# Patient Record
Sex: Female | Born: 1945
Health system: Southern US, Community
[De-identification: ages and names within clinical notes are randomized; demographics above are authoritative.]

## PROBLEM LIST (undated history)

## (undated) DIAGNOSIS — H9190 Unspecified hearing loss, unspecified ear: Secondary | ICD-10-CM

## (undated) DIAGNOSIS — E039 Hypothyroidism, unspecified: Secondary | ICD-10-CM

## (undated) DIAGNOSIS — G4730 Sleep apnea, unspecified: Secondary | ICD-10-CM

## (undated) DIAGNOSIS — T7840XA Allergy, unspecified, initial encounter: Secondary | ICD-10-CM

## (undated) DIAGNOSIS — K900 Celiac disease: Secondary | ICD-10-CM

## (undated) DIAGNOSIS — E119 Type 2 diabetes mellitus without complications: Secondary | ICD-10-CM

## (undated) DIAGNOSIS — K5792 Diverticulitis of intestine, part unspecified, without perforation or abscess without bleeding: Secondary | ICD-10-CM

## (undated) DIAGNOSIS — E785 Hyperlipidemia, unspecified: Secondary | ICD-10-CM

## (undated) DIAGNOSIS — I10 Essential (primary) hypertension: Secondary | ICD-10-CM

## (undated) DIAGNOSIS — J029 Acute pharyngitis, unspecified: Secondary | ICD-10-CM

## (undated) DIAGNOSIS — J449 Chronic obstructive pulmonary disease, unspecified: Secondary | ICD-10-CM

## (undated) DIAGNOSIS — D649 Anemia, unspecified: Secondary | ICD-10-CM

## (undated) HISTORY — PX: EYE SURGERY: SHX253

## (undated) HISTORY — PX: VAGINAL HYSTERECTOMY: SUR661

## (undated) HISTORY — DX: Acute pharyngitis, unspecified: J02.9

## (undated) HISTORY — DX: Hyperlipidemia, unspecified: E78.5

## (undated) HISTORY — DX: Celiac disease: K90.0

## (undated) HISTORY — PX: BREAST SURGERY: SHX581

## (undated) HISTORY — DX: Diverticulitis of intestine, part unspecified, without perforation or abscess without bleeding: K57.92

## (undated) HISTORY — DX: Hypothyroidism, unspecified: E03.9

## (undated) HISTORY — DX: Anemia, unspecified: D64.9

## (undated) HISTORY — DX: Chronic obstructive pulmonary disease, unspecified: J44.9

## (undated) HISTORY — PX: SPINE SURGERY: SHX786

## (undated) HISTORY — DX: Type 2 diabetes mellitus without complications: E11.9

## (undated) HISTORY — DX: Allergy, unspecified, initial encounter: T78.40XA

## (undated) HISTORY — PX: APPENDECTOMY: SHX54

---

## 1968-05-30 DIAGNOSIS — Z780 Asymptomatic menopausal state: Secondary | ICD-10-CM | POA: Insufficient documentation

## 1988-05-30 DIAGNOSIS — E039 Hypothyroidism, unspecified: Secondary | ICD-10-CM | POA: Insufficient documentation

## 2000-05-30 DIAGNOSIS — F4310 Post-traumatic stress disorder, unspecified: Secondary | ICD-10-CM | POA: Insufficient documentation

## 2004-06-23 ENCOUNTER — Ambulatory Visit: Payer: Self-pay | Admitting: Family Medicine

## 2004-06-24 ENCOUNTER — Ambulatory Visit: Payer: Self-pay | Admitting: *Deleted

## 2004-06-25 ENCOUNTER — Ambulatory Visit: Payer: Self-pay | Admitting: Family Medicine

## 2004-07-02 ENCOUNTER — Ambulatory Visit: Payer: Self-pay | Admitting: Family Medicine

## 2004-08-05 ENCOUNTER — Ambulatory Visit: Payer: Self-pay | Admitting: Family Medicine

## 2004-08-12 ENCOUNTER — Ambulatory Visit: Payer: Self-pay | Admitting: Family Medicine

## 2004-08-19 ENCOUNTER — Ambulatory Visit (HOSPITAL_COMMUNITY): Admission: RE | Admit: 2004-08-19 | Discharge: 2004-08-19 | Payer: Self-pay | Admitting: Family Medicine

## 2004-08-20 ENCOUNTER — Ambulatory Visit: Payer: Self-pay | Admitting: Internal Medicine

## 2004-08-24 ENCOUNTER — Ambulatory Visit: Payer: Self-pay | Admitting: Internal Medicine

## 2004-09-02 ENCOUNTER — Ambulatory Visit: Payer: Self-pay | Admitting: Family Medicine

## 2004-09-14 ENCOUNTER — Ambulatory Visit: Payer: Self-pay | Admitting: Family Medicine

## 2004-09-22 ENCOUNTER — Ambulatory Visit: Payer: Self-pay | Admitting: Family Medicine

## 2004-10-04 ENCOUNTER — Ambulatory Visit: Payer: Self-pay | Admitting: Family Medicine

## 2004-10-12 ENCOUNTER — Ambulatory Visit: Payer: Self-pay | Admitting: Family Medicine

## 2004-11-04 ENCOUNTER — Ambulatory Visit: Payer: Self-pay | Admitting: Family Medicine

## 2004-11-15 ENCOUNTER — Ambulatory Visit: Payer: Self-pay | Admitting: Family Medicine

## 2004-11-18 ENCOUNTER — Ambulatory Visit: Payer: Self-pay | Admitting: Family Medicine

## 2004-12-02 ENCOUNTER — Ambulatory Visit: Payer: Self-pay | Admitting: Family Medicine

## 2005-01-19 ENCOUNTER — Ambulatory Visit: Payer: Self-pay | Admitting: Family Medicine

## 2005-02-09 ENCOUNTER — Ambulatory Visit: Payer: Self-pay | Admitting: Family Medicine

## 2007-07-01 DIAGNOSIS — R197 Diarrhea, unspecified: Secondary | ICD-10-CM | POA: Insufficient documentation

## 2007-07-01 LAB — CONVERTED CEMR LAB: Pap Smear: NEGATIVE

## 2008-04-29 DIAGNOSIS — S82899A Other fracture of unspecified lower leg, initial encounter for closed fracture: Secondary | ICD-10-CM | POA: Insufficient documentation

## 2008-10-15 ENCOUNTER — Ambulatory Visit: Payer: Self-pay | Admitting: Internal Medicine

## 2008-10-15 DIAGNOSIS — J309 Allergic rhinitis, unspecified: Secondary | ICD-10-CM | POA: Insufficient documentation

## 2008-10-15 DIAGNOSIS — F17200 Nicotine dependence, unspecified, uncomplicated: Secondary | ICD-10-CM | POA: Insufficient documentation

## 2008-10-15 LAB — CONVERTED CEMR LAB
Blood Glucose, Fingerstick: 108
Hgb A1c MFr Bld: 6.2 %

## 2008-10-16 DIAGNOSIS — E785 Hyperlipidemia, unspecified: Secondary | ICD-10-CM | POA: Insufficient documentation

## 2008-10-22 ENCOUNTER — Ambulatory Visit: Payer: Self-pay | Admitting: Nurse Practitioner

## 2008-10-22 LAB — CONVERTED CEMR LAB
Bilirubin Urine: NEGATIVE
Blood in Urine, dipstick: NEGATIVE
Glucose, Urine, Semiquant: NEGATIVE
Ketones, urine, test strip: NEGATIVE
Nitrite: NEGATIVE
Protein, U semiquant: NEGATIVE
Specific Gravity, Urine: 1.025
Urobilinogen, UA: 0.2
WBC Urine, dipstick: NEGATIVE
pH: 5

## 2008-10-23 ENCOUNTER — Encounter (INDEPENDENT_AMBULATORY_CARE_PROVIDER_SITE_OTHER): Payer: Self-pay | Admitting: Nurse Practitioner

## 2008-10-23 DIAGNOSIS — D649 Anemia, unspecified: Secondary | ICD-10-CM | POA: Insufficient documentation

## 2008-10-23 LAB — CONVERTED CEMR LAB
ALT: 11 units/L (ref 0–35)
AST: 15 units/L (ref 0–37)
Albumin: 4.3 g/dL (ref 3.5–5.2)
Alkaline Phosphatase: 53 units/L (ref 39–117)
BUN: 13 mg/dL (ref 6–23)
Basophils Absolute: 0.1 10*3/uL (ref 0.0–0.1)
Basophils Relative: 1 % (ref 0–1)
CO2: 26 meq/L (ref 19–32)
Calcium: 9.3 mg/dL (ref 8.4–10.5)
Chloride: 106 meq/L (ref 96–112)
Cholesterol: 189 mg/dL (ref 0–200)
Creatinine, Ser: 0.73 mg/dL (ref 0.40–1.20)
Eosinophils Absolute: 0.4 10*3/uL (ref 0.0–0.7)
Eosinophils Relative: 4 % (ref 0–5)
Glucose, Bld: 89 mg/dL (ref 70–99)
HCT: 39.7 % (ref 36.0–46.0)
HDL: 79 mg/dL (ref >39)
Hemoglobin: 11.8 g/dL — ABNORMAL LOW (ref 12.0–15.0)
LDL Cholesterol: 83 mg/dL (ref 0–99)
Lymphocytes Relative: 30 % (ref 12–46)
Lymphs Abs: 2.9 10*3/uL (ref 0.7–4.0)
MCHC: 29.7 g/dL — ABNORMAL LOW (ref 30.0–36.0)
MCV: 87.1 fL (ref 78.0–100.0)
Microalb, Ur: 1.01 mg/dL (ref 0.00–1.89)
Monocytes Absolute: 1.2 10*3/uL — ABNORMAL HIGH (ref 0.1–1.0)
Monocytes Relative: 12 % (ref 3–12)
Neutro Abs: 5.1 10*3/uL (ref 1.7–7.7)
Neutrophils Relative %: 53 % (ref 43–77)
Platelets: 421 10*3/uL — ABNORMAL HIGH (ref 150–400)
Potassium: 5 meq/L (ref 3.5–5.3)
RBC: 4.56 M/uL (ref 3.87–5.11)
RDW: 15.5 % (ref 11.5–15.5)
Sodium: 145 meq/L (ref 135–145)
TSH: 5.62 microintl units/mL — ABNORMAL HIGH (ref 0.350–4.500)
Total Bilirubin: 0.3 mg/dL (ref 0.3–1.2)
Total CHOL/HDL Ratio: 2.4
Total Protein: 7.1 g/dL (ref 6.0–8.3)
Triglycerides: 136 mg/dL (ref <150)
VLDL: 27 mg/dL (ref 0–40)
WBC: 9.6 10*3/uL (ref 4.0–10.5)

## 2008-11-05 ENCOUNTER — Ambulatory Visit: Payer: Self-pay | Admitting: Nurse Practitioner

## 2008-11-05 DIAGNOSIS — F329 Major depressive disorder, single episode, unspecified: Secondary | ICD-10-CM | POA: Insufficient documentation

## 2008-11-05 DIAGNOSIS — R5381 Other malaise: Secondary | ICD-10-CM | POA: Insufficient documentation

## 2008-11-05 DIAGNOSIS — R5383 Other fatigue: Secondary | ICD-10-CM

## 2008-11-05 LAB — CONVERTED CEMR LAB
Blood Glucose, Fingerstick: 122
Cholesterol, target level: 200 mg/dL
HDL goal, serum: 40 mg/dL
LDL Goal: 100 mg/dL

## 2008-11-06 DIAGNOSIS — E559 Vitamin D deficiency, unspecified: Secondary | ICD-10-CM | POA: Insufficient documentation

## 2008-11-06 LAB — CONVERTED CEMR LAB
HCT: 42 % (ref 36.0–46.0)
Hemoglobin: 12.6 g/dL (ref 12.0–15.0)
MCHC: 30 g/dL (ref 30.0–36.0)
MCV: 89.4 fL (ref 78.0–100.0)
Platelets: 448 10*3/uL — ABNORMAL HIGH (ref 150–400)
RBC: 4.7 M/uL (ref 3.87–5.11)
RDW: 16 % — ABNORMAL HIGH (ref 11.5–15.5)
Retic Ct Pct: 1.8 % (ref 0.4–3.1)
WBC: 10.6 10*3/uL — ABNORMAL HIGH (ref 4.0–10.5)

## 2008-11-11 ENCOUNTER — Ambulatory Visit (HOSPITAL_COMMUNITY): Admission: RE | Admit: 2008-11-11 | Discharge: 2008-11-11 | Payer: Self-pay | Admitting: Internal Medicine

## 2008-11-12 ENCOUNTER — Encounter (INDEPENDENT_AMBULATORY_CARE_PROVIDER_SITE_OTHER): Payer: Self-pay | Admitting: Nurse Practitioner

## 2008-11-13 ENCOUNTER — Ambulatory Visit: Payer: Self-pay | Admitting: Internal Medicine

## 2008-11-18 DIAGNOSIS — M949 Disorder of cartilage, unspecified: Secondary | ICD-10-CM

## 2008-11-18 DIAGNOSIS — M899 Disorder of bone, unspecified: Secondary | ICD-10-CM | POA: Insufficient documentation

## 2008-11-19 ENCOUNTER — Encounter (INDEPENDENT_AMBULATORY_CARE_PROVIDER_SITE_OTHER): Payer: Self-pay | Admitting: *Deleted

## 2008-11-19 ENCOUNTER — Encounter (INDEPENDENT_AMBULATORY_CARE_PROVIDER_SITE_OTHER): Payer: Self-pay | Admitting: Nurse Practitioner

## 2008-12-03 ENCOUNTER — Ambulatory Visit: Payer: Self-pay | Admitting: Nurse Practitioner

## 2008-12-03 DIAGNOSIS — H539 Unspecified visual disturbance: Secondary | ICD-10-CM | POA: Insufficient documentation

## 2008-12-03 LAB — CONVERTED CEMR LAB: Blood Glucose, Fingerstick: 78

## 2008-12-14 ENCOUNTER — Encounter (INDEPENDENT_AMBULATORY_CARE_PROVIDER_SITE_OTHER): Payer: Self-pay | Admitting: Nurse Practitioner

## 2009-02-11 ENCOUNTER — Ambulatory Visit: Payer: Self-pay | Admitting: Nurse Practitioner

## 2009-02-12 LAB — CONVERTED CEMR LAB
ALT: 11 units/L (ref 0–35)
AST: 15 units/L (ref 0–37)
Albumin: 4.3 g/dL (ref 3.5–5.2)
Alkaline Phosphatase: 53 units/L (ref 39–117)
Bilirubin, Direct: 0.1 mg/dL (ref 0.0–0.3)
Cholesterol: 211 mg/dL — ABNORMAL HIGH (ref 0–200)
HDL: 79 mg/dL (ref >39)
Hgb A1c MFr Bld: 6.2 % — ABNORMAL HIGH (ref 4.6–6.1)
Indirect Bilirubin: 0.2 mg/dL (ref 0.0–0.9)
LDL Cholesterol: 106 mg/dL — ABNORMAL HIGH (ref 0–99)
TSH: 12.421 microintl units/mL — ABNORMAL HIGH (ref 0.350–4.500)
Total Bilirubin: 0.3 mg/dL (ref 0.3–1.2)
Total CHOL/HDL Ratio: 2.7
Total Protein: 7 g/dL (ref 6.0–8.3)
Triglycerides: 129 mg/dL (ref <150)
VLDL: 26 mg/dL (ref 0–40)
Vit D, 25-Hydroxy: 33 ng/mL (ref 30–89)

## 2009-02-18 ENCOUNTER — Ambulatory Visit: Payer: Self-pay | Admitting: Nurse Practitioner

## 2009-02-18 LAB — CONVERTED CEMR LAB: Blood Glucose, Fingerstick: 110

## 2009-05-10 DIAGNOSIS — J4489 Other specified chronic obstructive pulmonary disease: Secondary | ICD-10-CM | POA: Insufficient documentation

## 2009-05-10 DIAGNOSIS — J449 Chronic obstructive pulmonary disease, unspecified: Secondary | ICD-10-CM | POA: Insufficient documentation

## 2009-05-11 DIAGNOSIS — J438 Other emphysema: Secondary | ICD-10-CM | POA: Insufficient documentation

## 2009-05-17 ENCOUNTER — Inpatient Hospital Stay (HOSPITAL_COMMUNITY): Admission: EM | Admit: 2009-05-17 | Discharge: 2009-05-19 | Payer: Self-pay | Admitting: Emergency Medicine

## 2009-05-27 ENCOUNTER — Ambulatory Visit: Payer: Self-pay | Admitting: Nurse Practitioner

## 2009-05-27 LAB — CONVERTED CEMR LAB
Blood Glucose, Fingerstick: 94
Hgb A1c MFr Bld: 6.2 %

## 2009-05-28 LAB — CONVERTED CEMR LAB: TSH: 9.135 microintl units/mL — ABNORMAL HIGH (ref 0.350–4.500)

## 2009-06-04 ENCOUNTER — Telehealth (INDEPENDENT_AMBULATORY_CARE_PROVIDER_SITE_OTHER): Payer: Self-pay | Admitting: Nurse Practitioner

## 2009-06-12 ENCOUNTER — Ambulatory Visit: Payer: Self-pay | Admitting: Nurse Practitioner

## 2009-06-12 LAB — CONVERTED CEMR LAB: Blood Glucose, Fingerstick: 102

## 2009-06-15 LAB — CONVERTED CEMR LAB
ALT: 13 units/L (ref 0–35)
AST: 14 units/L (ref 0–37)
Albumin: 4.1 g/dL (ref 3.5–5.2)
Alkaline Phosphatase: 58 units/L (ref 39–117)
Bilirubin, Direct: 0.1 mg/dL (ref 0.0–0.3)
Cholesterol: 191 mg/dL (ref 0–200)
HDL: 63 mg/dL (ref >39)
Indirect Bilirubin: 0.2 mg/dL (ref 0.0–0.9)
LDL Cholesterol: 107 mg/dL — ABNORMAL HIGH (ref 0–99)
Total Bilirubin: 0.3 mg/dL (ref 0.3–1.2)
Total CHOL/HDL Ratio: 3
Total Protein: 7 g/dL (ref 6.0–8.3)
Triglycerides: 106 mg/dL (ref <150)
VLDL: 21 mg/dL (ref 0–40)

## 2009-06-24 ENCOUNTER — Encounter (INDEPENDENT_AMBULATORY_CARE_PROVIDER_SITE_OTHER): Payer: Self-pay | Admitting: *Deleted

## 2009-06-24 ENCOUNTER — Telehealth (INDEPENDENT_AMBULATORY_CARE_PROVIDER_SITE_OTHER): Payer: Self-pay | Admitting: Nurse Practitioner

## 2009-07-20 ENCOUNTER — Encounter (INDEPENDENT_AMBULATORY_CARE_PROVIDER_SITE_OTHER): Payer: Self-pay | Admitting: Nurse Practitioner

## 2009-08-05 ENCOUNTER — Emergency Department (HOSPITAL_COMMUNITY): Admission: EM | Admit: 2009-08-05 | Discharge: 2009-08-05 | Payer: Self-pay | Admitting: Emergency Medicine

## 2009-08-09 ENCOUNTER — Emergency Department (HOSPITAL_COMMUNITY): Admission: EM | Admit: 2009-08-09 | Discharge: 2009-08-10 | Payer: Self-pay | Admitting: Emergency Medicine

## 2010-06-20 ENCOUNTER — Encounter: Payer: Self-pay | Admitting: Family Medicine

## 2010-06-21 ENCOUNTER — Encounter: Payer: Self-pay | Admitting: Internal Medicine

## 2010-06-29 NOTE — Progress Notes (Signed)
Summary: [facility]  Phone Note From Other [facility]   Summary of Call: [facility] Keenan [facility] [phone] called to give her O2 sat 97% on 1liter then after 15 mins rechecked while pt was sitting and it went to 98%, he monitored her doing daily activities going to the bathroom, then rechecked 02 sat and it was at 92%.  He state no signs of oxygen hunger and if you had any questions for him that he could be reached at the number above. Initial call taken by: Isla Pence,  June 04, 2009 10:57 AM  Follow-up for Phone Call        Noted. Pt has f/u in this office on next week and will decrease oxygen at that point Follow-up by: Aurora Mask FNP,  June 04, 2009 2:48 PM

## 2010-06-29 NOTE — Letter (Signed)
Summary: *HSN Results Follow up  [place], [place] [address]   Phone: [phone]  Fax: [phone]      06/24/2009   [facility] [address] [place], [place]  [address]   Dear  Ms. Autumn Bowen,                            ____S.Drinkard,FNP   ____D. Gore,FNP       ____B. McPherson,MD   ____V. Rankins,MD    ____E. Mulberry,MD    ____N. Hassell Done, FNP  ____D. Jobe Igo, MD    ____K. Tomma Lightning, MD    ____Other     This letter is to inform you that your recent test(s):  _______Pap Smear    _______Lab Test     _______X-ray    _______ is within acceptable limits  _______ requires a medication change  _______ requires a follow-up lab visit  _______ requires a follow-up visit with your provider    Comments:  We have tried reaching you at [phone].  Please contact the office for lab results at your earliest convenience.       _________________________________________________________ If you have any questions, please contact our office                     Sincerely,  Isla Pence [facility]

## 2010-06-29 NOTE — Letter (Signed)
Summary: [facility] FORMS   Imported By: Roberto Scales 11/04/2008 17:11:29  _____________________________________________________________________  External Attachment:    Type:   Image     Comment:   External Document

## 2010-06-29 NOTE — Progress Notes (Signed)
Summary: [place]  Phone Note Call from Patient Call back at [facility] Phone [phone] Call back at [phone]   Summary of Call: The pt needs the medical assistant or the provider to call her back because she has a question in reference to one medication. [facility]  FNP Initial call taken by: Alexis Goodell,  June 24, 2009 11:55 AM  Follow-up for Phone Call        pt informed about medication change.  She says that she has an appt wiith eligibility the first week in February. She said she can not get the medication from there she needs it sent to the [facility] in [place]. Follow-up by: Isla Pence,  June 24, 2009 2:31 PM  Additional Follow-up for Phone Call Additional follow up Details #1::        med will be expensive at [place] will given 4 weeks worth of samples by then she should have her eligibilty card renewed and will be able to get from [place] Additional Follow-up by: Aurora Mask FNP,  June 24, 2009 2:43 PM    Additional Follow-up for Phone Call Additional follow up Details #2::    pt informed.  Pt will come today to pick up samples.  Samples are at front desk. Follow-up by: Isla Pence,  June 24, 2009 2:52 PM

## 2010-06-29 NOTE — Letter (Signed)
Summary: [facility]ORDERS  [facility]//ORDERS   Imported By: Roland Earl 07/20/2009 12:59:11  _____________________________________________________________________  External Attachment:    Type:   Image     Comment:   External Document

## 2010-06-29 NOTE — Assessment & Plan Note (Signed)
Summary: COPD/Hypothyroidism   Vital Signs:  Patient profile:   65 year old female Menstrual status:  hysterectomy Weight:      245.7 pounds O2 Sat:      96 % on Room air Temp:     97.7 degrees F oral Pulse rhythm:   regular BP sitting:   153 / 84  (left arm) Cuff size:   large  Vitals Entered By: Isla Pence (June 12, 2009 12:55 PM)  O2 Flow:  Room air CC: 2 week follow-up, Lipid Management Is Patient Diabetic? Yes Pain Assessment Patient in pain? yes     Location: shoulder Intensity: 6-7 CBG Result 102 CBG Device ID B  Does patient need assistance? Functional Status Self care Ambulation Normal   CC:  2 week follow-up and Lipid Management.  History of Present Illness:  Pt into the office for follow up - COPD.  Pt into the office today for f/u on oxygen requirements. Pt is NOT wearing her oxygen today and admits that since she was last evaluated by [facility].    Diabetes - Presents today with blood sugar log: 96, 103, 100, 105, 84, 103, 94, 103, 111, 102, 104, 110, 103, 122  Left shoulder subluxation - pt was to go to ortho and she called to make an appointment but was told she needed a co-payment so she did not move forward.  Lipid Management History:      Positive NCEP/ATP III risk factors include female age 10 years old or older and diabetes.  Negative NCEP/ATP III risk factors include no history of early menopause without estrogen hormone replacement, HDL cholesterol greater than 60, non-tobacco-user status, non-hypertensive, no ASHD (atherosclerotic heart disease), no prior stroke/TIA, no peripheral vascular disease, and no history of aortic aneurysm.        The patient states that she does not know about the "Therapeutic Lifestyle Change" diet.  The patient does not know about adjunctive measures for cholesterol lowering.  She expresses no side effects from her lipid-lowering medication.  The patient denies any symptoms to suggest myopathy or liver  disease.     Habits & Providers  Alcohol-Tobacco-Diet     Alcohol drinks/day: 0     Tobacco Status: quit < 6 months     Tobacco Counseling: to quit use of tobacco products     Cigarette Packs/Day: 5-6     Year Started: age 29     Year Quit: 05/16/2009  Exercise-Depression-Behavior     Does Patient Exercise: no     Exercise Counseling: to improve exercise regimen     Have you felt down or hopeless? no     Have you felt little pleasure in things? no     Drug Use: never     Seat Belt Use: always  Comments: Smoked 2 cigarettes since her last visit here.  Medications Prior to Update: 1)  Bayer Low Strength 81 Mg Tbec (Aspirin) .... One Tablet By Mouth Daily 2)  Glipizide 10 Mg Tabs (Glipizide) .Marland Kitchen.. 1 Tablet By Mouth Two Times A Day 3)  Levothyroxine Sodium 175 Mcg Tabs (Levothyroxine Sodium) .... One Tablet By Mouth Daily **pharmacy - Note Increase in Dose** 4)  Pravastatin Sodium 40 Mg Tabs (Pravastatin Sodium) .... 2 Tablets By Mouth Nightly 5)  Actos 30 Mg Tabs (Pioglitazone Hcl) .... One Tablet By Mouth Daily For Diabetes 6)  Sertraline Hcl 100 Mg Tabs (Sertraline Hcl) .... One Tablet By Mouth For Mood 7)  Seroquel 200 Mg Tabs (Quetiapine Fumarate) .Marland KitchenMarland KitchenMarland Kitchen  1 and 1/2 Tablet By Mouth Daily 8)  Ferrous Sulfate 325 (65 Fe) Mg Tabs (Ferrous Sulfate) .... One Tablet By Mouth Daily 9)  Calcium-D 600-200 Mg-Unit Tabs (Calcium Carbonate-Vitamin D) .Marland Kitchen.. 1 Tablet By Mouth Two Times A Day 10)  Ventolin Hfa 108 (90 Base) Mcg/act Aers (Albuterol Sulfate) .... Inhale 2 Puffs Ever 4 Hours As Needed For Shortness of Breath  Allergies (verified): 1)  ! * Ivp Dye 2)  ! Penicillin 3)  ! Lipitor (Atorvastatin) 4)  ! * Nexium 5)  ! * Bee Stings 6)  ! * Latex  Review of Systems General:  Denies fever. CV:  Denies chest pain or discomfort. Resp:  Denies cough and shortness of breath. GI:  Denies abdominal pain, nausea, and vomiting. MS:  Complains of joint pain; left shoulder.  Physical  Exam  General:  alert.   Head:  normocephalic.   Lungs:  normal breath sounds.   Heart:  normal rate and regular rhythm.   Abdomen:  normal bowel sounds.   Neurologic:  alert & oriented X3.     Impression & Recommendations:  Problem # 1:  COPD (ICD-496) Assessment Comment Only Doing will without her oxygen during the day but is still using at night advised [facility] for [facility] sats at home  Her updated medication list for this problem includes:    Ventolin Hfa 108 (90 Base) Mcg/act Aers (Albuterol sulfate) ..... Inhale 2 puffs ever 4 hours as needed for shortness of breath  Orders: Pulse Oximetry (single measurment) (68127)  Problem # 2:  DYSLIPIDEMIA (ICD-272.4) will check cholesterol today. Her updated medication list for this problem includes:    Pravastatin Sodium 40 Mg Tabs (Pravastatin sodium) .Marland Kitchen... 2 tablets by mouth nightly  Problem # 3:  SUBLUXATION OF THE LEFT SHOULDER (ICD-755.59) Pt advised that she will need to f/u with ortho and she may need a co-payment  Problem # 4:  HYPOTHYROIDISM (ICD-244.9)  Her updated medication list for this problem includes:    Levothyroxine Sodium 175 Mcg Tabs (Levothyroxine sodium) ..... One tablet by mouth daily **pharmacy - note increase in dose**  Complete Medication List: 1)  Bayer Low Strength 81 Mg Tbec (Aspirin) .... One tablet by mouth daily 2)  Glipizide 10 Mg Tabs (Glipizide) .Marland Kitchen.. 1 tablet by mouth two times a day 3)  Levothyroxine Sodium 175 Mcg Tabs (Levothyroxine sodium) .... One tablet by mouth daily **pharmacy - note increase in dose** 4)  Pravastatin Sodium 40 Mg Tabs (Pravastatin sodium) .... 2 tablets by mouth nightly 5)  Actos 30 Mg Tabs (Pioglitazone hcl) .... One tablet by mouth daily for diabetes 6)  Sertraline Hcl 100 Mg Tabs (Sertraline hcl) .... One tablet by mouth for mood 7)  Seroquel 200 Mg Tabs (Quetiapine fumarate) .Marland Kitchen.. 1 and 1/2 tablet by mouth daily 8)  Ferrous Sulfate 325 (65 Fe) Mg  Tabs (Ferrous sulfate) .... One tablet by mouth daily 9)  Calcium-d 600-200 Mg-unit Tabs (Calcium carbonate-vitamin d) .Marland Kitchen.. 1 tablet by mouth two times a day 10)  Ventolin Hfa 108 (90 Base) Mcg/act Aers (Albuterol sulfate) .... Inhale 2 puffs ever 4 hours as needed for shortness of breath  Lipid Assessment/Plan:      Based on NCEP/ATP III, the patient's risk factor category is "history of diabetes".  The patient's lipid goals are as follows: Total cholesterol goal is 200; LDL cholesterol goal is 100; HDL cholesterol goal is 40; Triglyceride goal is 150.    Patient Instructions: 1)  Your oxygen is doing good.  Ok to continue using your oxygen at night. 2)  Follow up in this office in March 2011. 3)  Will need TSH.

## 2010-06-29 NOTE — Letter (Signed)
Summary: [facility] //PROFESSIONAL COMMUNICATION  [facility] //PROFESSIONAL COMMUNICATION   Imported By: Roland Earl 07/28/2009 14:39:11  _____________________________________________________________________  External Attachment:    Type:   Image     Comment:   External Document

## 2010-08-23 LAB — GLUCOSE, CAPILLARY: Glucose-Capillary: 157 mg/dL — ABNORMAL HIGH (ref 70–99)

## 2010-08-30 LAB — BLOOD GAS, ARTERIAL
Acid-Base Excess: 0.5 mmol/L (ref 0.0–2.0)
Acid-Base Excess: 1.5 mmol/L (ref 0.0–2.0)
Acid-Base Excess: 3.9 mmol/L — ABNORMAL HIGH (ref 0.0–2.0)
Acid-Base Excess: 8 mmol/L — ABNORMAL HIGH (ref 0.0–2.0)
Bicarbonate: 27 mEq/L — ABNORMAL HIGH (ref 20.0–24.0)
Bicarbonate: 28.5 mEq/L — ABNORMAL HIGH (ref 20.0–24.0)
Bicarbonate: 31.9 mEq/L — ABNORMAL HIGH (ref 20.0–24.0)
Bicarbonate: 34 mEq/L — ABNORMAL HIGH (ref 20.0–24.0)
Drawn by: 229971
Drawn by: 309961
Drawn by: 309961
FIO2: 0.21 %
O2 Content: 2 L/min
O2 Content: 2 L/min
O2 Content: 2 L/min
O2 Saturation: 91.6 %
O2 Saturation: 92.1 %
O2 Saturation: 96.9 %
O2 Saturation: 98.2 %
Patient temperature: 98.6
Patient temperature: 98.6
Patient temperature: 98.6
Patient temperature: 98.6
TCO2: 24.6 mmol/L (ref 0–100)
TCO2: 26.2 mmol/L (ref 0–100)
TCO2: 29.6 mmol/L (ref 0–100)
TCO2: 31 mmol/L (ref 0–100)
pCO2 arterial: 54.4 mmHg — ABNORMAL HIGH (ref 35.0–45.0)
pCO2 arterial: 56.6 mmHg — ABNORMAL HIGH (ref 35.0–45.0)
pCO2 arterial: 59.1 mmHg (ref 35.0–45.0)
pCO2 arterial: 69.1 mmHg (ref 35.0–45.0)
pH, Arterial: 7.287 — ABNORMAL LOW (ref 7.350–7.400)
pH, Arterial: 7.305 — ABNORMAL LOW (ref 7.350–7.400)
pH, Arterial: 7.316 — ABNORMAL LOW (ref 7.350–7.400)
pH, Arterial: 7.395 (ref 7.350–7.400)
pO2, Arterial: 109 mmHg — ABNORMAL HIGH (ref 80.0–100.0)
pO2, Arterial: 61.3 mmHg — ABNORMAL LOW (ref 80.0–100.0)
pO2, Arterial: 65.9 mmHg — ABNORMAL LOW (ref 80.0–100.0)
pO2, Arterial: 78.6 mmHg — ABNORMAL LOW (ref 80.0–100.0)

## 2010-08-30 LAB — URINALYSIS, ROUTINE W REFLEX MICROSCOPIC
Bilirubin Urine: NEGATIVE
Glucose, UA: NEGATIVE mg/dL
Hgb urine dipstick: NEGATIVE
Ketones, ur: NEGATIVE mg/dL
Nitrite: NEGATIVE
Protein, ur: NEGATIVE mg/dL
Specific Gravity, Urine: 1.025 (ref 1.005–1.030)
Urobilinogen, UA: 0.2 mg/dL (ref 0.0–1.0)
pH: 5.5 (ref 5.0–8.0)

## 2010-08-30 LAB — CULTURE, BLOOD (ROUTINE X 2)
Culture: NO GROWTH
Culture: NO GROWTH

## 2010-08-30 LAB — CBC
HCT: 37.1 % (ref 36.0–46.0)
HCT: 41.7 % (ref 36.0–46.0)
Hemoglobin: 12 g/dL (ref 12.0–15.0)
Hemoglobin: 14 g/dL (ref 12.0–15.0)
MCHC: 32.4 g/dL (ref 30.0–36.0)
MCHC: 33.6 g/dL (ref 30.0–36.0)
MCV: 91.4 fL (ref 78.0–100.0)
MCV: 92.6 fL (ref 78.0–100.0)
Platelets: 270 10*3/uL (ref 150–400)
Platelets: 332 10*3/uL (ref 150–400)
RBC: 4 MIL/uL (ref 3.87–5.11)
RBC: 4.56 MIL/uL (ref 3.87–5.11)
RDW: 15.5 % (ref 11.5–15.5)
RDW: 16 % — ABNORMAL HIGH (ref 11.5–15.5)
WBC: 12.6 10*3/uL — ABNORMAL HIGH (ref 4.0–10.5)
WBC: 9.9 10*3/uL (ref 4.0–10.5)

## 2010-08-30 LAB — GLUCOSE, CAPILLARY
Glucose-Capillary: 110 mg/dL — ABNORMAL HIGH (ref 70–99)
Glucose-Capillary: 120 mg/dL — ABNORMAL HIGH (ref 70–99)
Glucose-Capillary: 127 mg/dL — ABNORMAL HIGH (ref 70–99)
Glucose-Capillary: 135 mg/dL — ABNORMAL HIGH (ref 70–99)
Glucose-Capillary: 135 mg/dL — ABNORMAL HIGH (ref 70–99)
Glucose-Capillary: 136 mg/dL — ABNORMAL HIGH (ref 70–99)
Glucose-Capillary: 142 mg/dL — ABNORMAL HIGH (ref 70–99)
Glucose-Capillary: 70 mg/dL (ref 70–99)
Glucose-Capillary: 72 mg/dL (ref 70–99)
Glucose-Capillary: 75 mg/dL (ref 70–99)
Glucose-Capillary: 77 mg/dL (ref 70–99)
Glucose-Capillary: 79 mg/dL (ref 70–99)

## 2010-08-30 LAB — CK TOTAL AND CKMB (NOT AT ARMC)
CK, MB: 2.4 ng/mL (ref 0.3–4.0)
Relative Index: 1.4 (ref 0.0–2.5)
Total CK: 166 U/L (ref 7–177)

## 2010-08-30 LAB — POCT CARDIAC MARKERS
CKMB, poc: <1 ng/mL — ABNORMAL LOW (ref 1.0–8.0)
Myoglobin, poc: 35.9 ng/mL (ref 12–200)
Troponin i, poc: <0.05 ng/mL (ref 0.00–0.09)

## 2010-08-30 LAB — CARDIAC PANEL(CRET KIN+CKTOT+MB+TROPI)
CK, MB: 2.1 ng/mL (ref 0.3–4.0)
CK, MB: 2.3 ng/mL (ref 0.3–4.0)
Relative Index: 1.4 (ref 0.0–2.5)
Relative Index: 1.6 (ref 0.0–2.5)
Total CK: 132 U/L (ref 7–177)
Total CK: 167 U/L (ref 7–177)
Troponin I: 0.02 ng/mL (ref 0.00–0.06)
Troponin I: 0.03 ng/mL (ref 0.00–0.06)

## 2010-08-30 LAB — BASIC METABOLIC PANEL
BUN: 10 mg/dL (ref 6–23)
BUN: 16 mg/dL (ref 6–23)
CO2: 26 mEq/L (ref 19–32)
CO2: 32 mEq/L (ref 19–32)
Calcium: 8.2 mg/dL — ABNORMAL LOW (ref 8.4–10.5)
Calcium: 8.6 mg/dL (ref 8.4–10.5)
Chloride: 99 mEq/L (ref 96–112)
Chloride: 99 mEq/L (ref 96–112)
Creatinine, Ser: 0.56 mg/dL (ref 0.4–1.2)
Creatinine, Ser: 0.7 mg/dL (ref 0.4–1.2)
GFR calc Af Amer: >60 mL/min (ref >60)
GFR calc Af Amer: >60 mL/min (ref >60)
GFR calc non Af Amer: >60 mL/min (ref >60)
GFR calc non Af Amer: >60 mL/min (ref >60)
Glucose, Bld: 166 mg/dL — ABNORMAL HIGH (ref 70–99)
Glucose, Bld: 43 mg/dL — ABNORMAL LOW (ref 70–99)
Potassium: 3.6 mEq/L (ref 3.5–5.1)
Potassium: 4 mEq/L (ref 3.5–5.1)
Sodium: 135 mEq/L (ref 135–145)
Sodium: 139 mEq/L (ref 135–145)

## 2010-08-30 LAB — ANGIOTENSIN CONVERTING ENZYME: Angiotensin-Converting Enzyme: 31 U/L (ref 9–67)

## 2010-08-30 LAB — LIPID PANEL
Cholesterol: 185 mg/dL (ref 0–200)
HDL: 61 mg/dL (ref >39)
LDL Cholesterol: 96 mg/dL (ref 0–99)
Total CHOL/HDL Ratio: 3 RATIO
Triglycerides: 139 mg/dL (ref <150)
VLDL: 28 mg/dL (ref 0–40)

## 2010-08-30 LAB — DIFFERENTIAL
Basophils Absolute: 0.2 10*3/uL — ABNORMAL HIGH (ref 0.0–0.1)
Basophils Relative: 2 % — ABNORMAL HIGH (ref 0–1)
Eosinophils Absolute: 0.3 10*3/uL (ref 0.0–0.7)
Eosinophils Relative: 2 % (ref 0–5)
Lymphocytes Relative: 25 % (ref 12–46)
Lymphs Abs: 3.2 10*3/uL (ref 0.7–4.0)
Monocytes Absolute: 1.2 10*3/uL — ABNORMAL HIGH (ref 0.1–1.0)
Monocytes Relative: 9 % (ref 3–12)
Neutro Abs: 7.7 10*3/uL (ref 1.7–7.7)
Neutrophils Relative %: 61 % (ref 43–77)

## 2010-08-30 LAB — HEMOGLOBIN A1C
Hgb A1c MFr Bld: 6.5 % — ABNORMAL HIGH (ref 4.6–6.1)
Mean Plasma Glucose: 140 mg/dL

## 2010-08-30 LAB — TSH
TSH: 6.821 u[IU]/mL — ABNORMAL HIGH (ref 0.350–4.500)
TSH: 7.28 u[IU]/mL — ABNORMAL HIGH (ref 0.350–4.500)

## 2010-08-30 LAB — D-DIMER, QUANTITATIVE: D-Dimer, Quant: 1.18 ug/mL-FEU — ABNORMAL HIGH (ref 0.00–0.48)

## 2010-08-30 LAB — TROPONIN I: Troponin I: 0.03 ng/mL (ref 0.00–0.06)

## 2011-05-06 DIAGNOSIS — R519 Headache, unspecified: Secondary | ICD-10-CM | POA: Insufficient documentation

## 2011-05-06 DIAGNOSIS — M25512 Pain in left shoulder: Secondary | ICD-10-CM | POA: Insufficient documentation

## 2011-05-06 DIAGNOSIS — G4700 Insomnia, unspecified: Secondary | ICD-10-CM | POA: Insufficient documentation

## 2011-09-14 ENCOUNTER — Ambulatory Visit (HOSPITAL_COMMUNITY): Payer: Self-pay | Admitting: Psychiatry

## 2011-09-21 ENCOUNTER — Ambulatory Visit (INDEPENDENT_AMBULATORY_CARE_PROVIDER_SITE_OTHER): Payer: Medicare Other | Admitting: Psychiatry

## 2011-09-21 ENCOUNTER — Encounter (HOSPITAL_COMMUNITY): Payer: Self-pay | Admitting: Psychiatry

## 2011-09-21 VITALS — BP 125/85 | HR 92 | Ht 63.0 in | Wt 218.0 lb

## 2011-09-21 DIAGNOSIS — F4310 Post-traumatic stress disorder, unspecified: Secondary | ICD-10-CM

## 2011-09-21 MED ORDER — SERTRALINE HCL 100 MG PO TABS: 200.0000 mg | ORAL_TABLET | Freq: Every day | ORAL | Status: DC

## 2011-09-21 MED ORDER — TRAZODONE HCL 50 MG PO TABS: ORAL_TABLET | ORAL | Status: DC
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
## 2011-09-22 ENCOUNTER — Telehealth (HOSPITAL_COMMUNITY): Payer: Self-pay

## 2011-09-26 ENCOUNTER — Encounter (HOSPITAL_COMMUNITY): Payer: Self-pay | Admitting: Psychiatry

## 2011-09-26 NOTE — Telephone Encounter (Signed)
Ackowledged

## 2011-09-27 ENCOUNTER — Telehealth (HOSPITAL_COMMUNITY): Payer: Self-pay | Admitting: Psychiatry

## 2011-09-27 DIAGNOSIS — F329 Major depressive disorder, single episode, unspecified: Secondary | ICD-10-CM

## 2011-09-27 MED ORDER — QUETIAPINE FUMARATE ER 200 MG PO TB24: 200.0000 mg | ORAL_TABLET | Freq: Every day | ORAL | Status: DC

## 2011-09-27 NOTE — Telephone Encounter (Signed)
Patient called reporting that had hives from trazodone. She continues to report depression. She states she is also unable to sleep since discontinuing quetiapine.  PLAN: Will restart quetiapine for augmentation of depression and insomnia.  Called pharmacy patient had been taking Seroquel XR 200 mg which she reports was started in  2006. Will continue this medication.

## 2011-10-19 ENCOUNTER — Ambulatory Visit (INDEPENDENT_AMBULATORY_CARE_PROVIDER_SITE_OTHER): Payer: Medicare Other | Admitting: Psychiatry

## 2011-10-19 ENCOUNTER — Encounter (HOSPITAL_COMMUNITY): Payer: Self-pay | Admitting: Psychiatry

## 2011-10-19 VITALS — BP 122/75 | HR 93 | Ht 63.0 in | Wt 218.0 lb

## 2011-10-19 DIAGNOSIS — F4310 Post-traumatic stress disorder, unspecified: Secondary | ICD-10-CM

## 2011-10-19 MED ORDER — QUETIAPINE FUMARATE ER 200 MG PO TB24: 200.0000 mg | ORAL_TABLET | Freq: Every day | ORAL | Status: DC

## 2011-10-19 MED ORDER — SERTRALINE HCL 100 MG PO TABS: 200.0000 mg | ORAL_TABLET | Freq: Every day | ORAL | Status: DC
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
## 2011-11-10 ENCOUNTER — Ambulatory Visit (INDEPENDENT_AMBULATORY_CARE_PROVIDER_SITE_OTHER): Payer: Medicare Other | Admitting: Psychiatry

## 2011-11-10 VITALS — BP 116/59 | HR 90 | Ht 63.0 in | Wt 218.5 lb

## 2011-11-10 DIAGNOSIS — F4310 Post-traumatic stress disorder, unspecified: Secondary | ICD-10-CM

## 2011-11-10 DIAGNOSIS — F329 Major depressive disorder, single episode, unspecified: Secondary | ICD-10-CM

## 2011-11-10 MED ORDER — QUETIAPINE FUMARATE 100 MG PO TABS: ORAL_TABLET | ORAL | Status: DC

## 2011-11-10 MED ORDER — SERTRALINE HCL 100 MG PO TABS: 200.0000 mg | ORAL_TABLET | Freq: Every day | ORAL | Status: DC
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
## 2011-11-15 ENCOUNTER — Encounter (HOSPITAL_COMMUNITY): Payer: Self-pay | Admitting: Psychiatry

## 2011-11-16 ENCOUNTER — Ambulatory Visit (HOSPITAL_COMMUNITY): Payer: Self-pay | Admitting: Licensed Clinical Social Worker

## 2011-12-08 ENCOUNTER — Ambulatory Visit (INDEPENDENT_AMBULATORY_CARE_PROVIDER_SITE_OTHER): Payer: Medicare Other | Admitting: Psychiatry

## 2011-12-08 ENCOUNTER — Encounter (HOSPITAL_COMMUNITY): Payer: Self-pay | Admitting: Psychiatry

## 2011-12-08 VITALS — BP 117/70 | HR 90 | Ht 63.0 in | Wt 218.0 lb

## 2011-12-08 DIAGNOSIS — F4310 Post-traumatic stress disorder, unspecified: Secondary | ICD-10-CM

## 2011-12-08 DIAGNOSIS — F329 Major depressive disorder, single episode, unspecified: Secondary | ICD-10-CM
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
## 2011-12-18 MED ORDER — QUETIAPINE FUMARATE 100 MG PO TABS: ORAL_TABLET | ORAL | Status: DC

## 2011-12-18 MED ORDER — SERTRALINE HCL 100 MG PO TABS: 200.0000 mg | ORAL_TABLET | Freq: Every day | ORAL | Status: DC

## 2011-12-27 ENCOUNTER — Telehealth (HOSPITAL_COMMUNITY): Payer: Self-pay

## 2011-12-27 DIAGNOSIS — F4310 Post-traumatic stress disorder, unspecified: Secondary | ICD-10-CM

## 2011-12-27 MED ORDER — QUETIAPINE FUMARATE 200 MG PO TABS: 200.0000 mg | ORAL_TABLET | Freq: Every day | ORAL | Status: DC

## 2011-12-27 MED ORDER — SERTRALINE HCL 100 MG PO TABS: 200.0000 mg | ORAL_TABLET | Freq: Every day | ORAL | Status: DC

## 2011-12-27 NOTE — Telephone Encounter (Signed)
Sertraline was only giben enough for 15 days can we call in more. Also serequel she is taking 1 1/2 and really needs it to be to. Can we also call in new rx for that.

## 2011-12-27 NOTE — Telephone Encounter (Signed)
Will give prescription for sertraline and increase dosage of quetiapine to 150 mg QHS. The patient denies SI/HI/AVH.  Called patient and left a message informing her.

## 2011-12-30 ENCOUNTER — Ambulatory Visit (INDEPENDENT_AMBULATORY_CARE_PROVIDER_SITE_OTHER): Payer: Medicare Other | Admitting: Psychiatry

## 2011-12-30 ENCOUNTER — Encounter (HOSPITAL_COMMUNITY): Payer: Self-pay | Admitting: Psychiatry

## 2011-12-30 VITALS — BP 111/63 | HR 93 | Ht 63.0 in | Wt 218.0 lb

## 2011-12-30 DIAGNOSIS — F4310 Post-traumatic stress disorder, unspecified: Secondary | ICD-10-CM

## 2011-12-30 DIAGNOSIS — F329 Major depressive disorder, single episode, unspecified: Secondary | ICD-10-CM

## 2011-12-30 MED ORDER — QUETIAPINE FUMARATE 200 MG PO TABS: 200.0000 mg | ORAL_TABLET | Freq: Every day | ORAL | Status: DC
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
## 2012-04-26 ENCOUNTER — Other Ambulatory Visit (HOSPITAL_COMMUNITY): Payer: Self-pay | Admitting: Psychiatry

## 2012-05-28 ENCOUNTER — Other Ambulatory Visit (HOSPITAL_COMMUNITY): Payer: Self-pay | Admitting: Psychiatry

## 2012-06-03 ENCOUNTER — Other Ambulatory Visit (HOSPITAL_COMMUNITY): Payer: Self-pay | Admitting: Psychiatry

## 2012-07-02 ENCOUNTER — Other Ambulatory Visit (HOSPITAL_COMMUNITY): Payer: Self-pay | Admitting: Psychiatry

## 2012-07-07 ENCOUNTER — Other Ambulatory Visit (HOSPITAL_COMMUNITY): Payer: Self-pay | Admitting: Psychiatry

## 2012-07-18 ENCOUNTER — Encounter (HOSPITAL_COMMUNITY): Payer: Self-pay | Admitting: Psychiatry

## 2012-07-18 ENCOUNTER — Ambulatory Visit (INDEPENDENT_AMBULATORY_CARE_PROVIDER_SITE_OTHER): Payer: Medicare Other | Admitting: Psychiatry

## 2012-07-18 VITALS — BP 119/71 | HR 81 | Ht 63.0 in | Wt 224.0 lb

## 2012-07-18 DIAGNOSIS — F329 Major depressive disorder, single episode, unspecified: Secondary | ICD-10-CM

## 2012-07-18 DIAGNOSIS — F4310 Post-traumatic stress disorder, unspecified: Secondary | ICD-10-CM

## 2012-07-18 MED ORDER — QUETIAPINE FUMARATE 200 MG PO TABS: 200.0000 mg | ORAL_TABLET | Freq: Every day | ORAL | Status: DC

## 2012-07-18 MED ORDER — SERTRALINE HCL 100 MG PO TABS: 200.0000 mg | ORAL_TABLET | Freq: Every day | ORAL | Status: DC
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
## 2012-08-15 ENCOUNTER — Encounter (HOSPITAL_COMMUNITY): Payer: Self-pay | Admitting: Psychiatry

## 2012-08-15 ENCOUNTER — Ambulatory Visit (INDEPENDENT_AMBULATORY_CARE_PROVIDER_SITE_OTHER): Payer: Medicare Other | Admitting: Psychiatry

## 2012-08-15 VITALS — BP 123/74 | HR 81 | Ht 63.0 in | Wt 222.0 lb

## 2012-08-15 DIAGNOSIS — F4310 Post-traumatic stress disorder, unspecified: Secondary | ICD-10-CM

## 2012-08-15 DIAGNOSIS — F329 Major depressive disorder, single episode, unspecified: Secondary | ICD-10-CM

## 2012-08-15 MED ORDER — QUETIAPINE FUMARATE 200 MG PO TABS: 200.0000 mg | ORAL_TABLET | Freq: Every day | ORAL | Status: DC

## 2012-08-15 MED ORDER — SERTRALINE HCL 100 MG PO TABS: 200.0000 mg | ORAL_TABLET | Freq: Every day | ORAL | Status: DC
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
## 2012-09-18 ENCOUNTER — Ambulatory Visit (INDEPENDENT_AMBULATORY_CARE_PROVIDER_SITE_OTHER): Payer: Medicare Other | Admitting: Psychiatry

## 2012-09-18 ENCOUNTER — Encounter (HOSPITAL_COMMUNITY): Payer: Self-pay | Admitting: Psychiatry

## 2012-09-18 VITALS — BP 118/57 | HR 80 | Ht 63.0 in | Wt 220.0 lb

## 2012-09-18 DIAGNOSIS — F4310 Post-traumatic stress disorder, unspecified: Secondary | ICD-10-CM

## 2012-09-18 DIAGNOSIS — F329 Major depressive disorder, single episode, unspecified: Secondary | ICD-10-CM

## 2012-09-18 MED ORDER — SERTRALINE HCL 100 MG PO TABS: 200.0000 mg | ORAL_TABLET | Freq: Every day | ORAL | Status: DC

## 2012-09-18 MED ORDER — QUETIAPINE FUMARATE 200 MG PO TABS: 200.0000 mg | ORAL_TABLET | Freq: Every day | ORAL | Status: DC
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
## 2012-10-01 ENCOUNTER — Other Ambulatory Visit (HOSPITAL_COMMUNITY): Payer: Self-pay | Admitting: Psychiatry

## 2012-10-09 ENCOUNTER — Ambulatory Visit (HOSPITAL_COMMUNITY): Payer: Self-pay | Admitting: Licensed Clinical Social Worker

## 2012-10-18 ENCOUNTER — Ambulatory Visit (INDEPENDENT_AMBULATORY_CARE_PROVIDER_SITE_OTHER): Payer: Medicare Other | Admitting: Psychiatry

## 2012-10-18 ENCOUNTER — Encounter (HOSPITAL_COMMUNITY): Payer: Self-pay | Admitting: Psychiatry

## 2012-10-18 VITALS — BP 102/64 | HR 66 | Ht 63.0 in | Wt 213.0 lb

## 2012-10-18 DIAGNOSIS — F329 Major depressive disorder, single episode, unspecified: Secondary | ICD-10-CM

## 2012-10-18 DIAGNOSIS — F4310 Post-traumatic stress disorder, unspecified: Secondary | ICD-10-CM

## 2012-10-18 MED ORDER — QUETIAPINE FUMARATE 200 MG PO TABS: 200.0000 mg | ORAL_TABLET | Freq: Every day | ORAL | Status: DC

## 2012-10-18 MED ORDER — SERTRALINE HCL 100 MG PO TABS: 200.0000 mg | ORAL_TABLET | Freq: Every day | ORAL | Status: DC
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
## 2012-10-31 ENCOUNTER — Ambulatory Visit (HOSPITAL_COMMUNITY): Payer: Self-pay | Admitting: Licensed Clinical Social Worker

## 2012-11-10 ENCOUNTER — Other Ambulatory Visit (HOSPITAL_COMMUNITY): Payer: Self-pay | Admitting: Psychiatry

## 2012-12-26 ENCOUNTER — Other Ambulatory Visit (HOSPITAL_COMMUNITY): Payer: Self-pay | Admitting: Psychiatry

## 2013-01-18 ENCOUNTER — Ambulatory Visit (HOSPITAL_COMMUNITY): Payer: Self-pay | Admitting: Psychiatry

## 2013-02-18 ENCOUNTER — Other Ambulatory Visit (HOSPITAL_COMMUNITY): Payer: Self-pay | Admitting: Psychiatry

## 2013-02-27 ENCOUNTER — Telehealth (HOSPITAL_COMMUNITY): Payer: Self-pay | Admitting: Psychiatry

## 2013-02-27 DIAGNOSIS — F329 Major depressive disorder, single episode, unspecified: Secondary | ICD-10-CM

## 2013-02-27 NOTE — Telephone Encounter (Signed)
Patient will reschedule for follow up.

## 2013-03-01 ENCOUNTER — Encounter (HOSPITAL_COMMUNITY): Payer: Self-pay | Admitting: Psychiatry

## 2013-03-01 ENCOUNTER — Ambulatory Visit (INDEPENDENT_AMBULATORY_CARE_PROVIDER_SITE_OTHER): Payer: Medicare Other | Admitting: Psychiatry

## 2013-03-01 VITALS — BP 103/62 | HR 80 | Ht 63.0 in | Wt 212.0 lb

## 2013-03-01 DIAGNOSIS — F329 Major depressive disorder, single episode, unspecified: Secondary | ICD-10-CM

## 2013-03-01 DIAGNOSIS — F4310 Post-traumatic stress disorder, unspecified: Secondary | ICD-10-CM

## 2013-03-01 MED ORDER — QUETIAPINE FUMARATE 200 MG PO TABS: 200.0000 mg | ORAL_TABLET | Freq: Every day | ORAL | Status: DC

## 2013-03-01 MED ORDER — SERTRALINE HCL 100 MG PO TABS: 200.0000 mg | ORAL_TABLET | Freq: Every day | ORAL | Status: DC
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
## 2013-03-28 ENCOUNTER — Other Ambulatory Visit (HOSPITAL_COMMUNITY): Payer: Self-pay | Admitting: Psychiatry

## 2013-06-05 ENCOUNTER — Other Ambulatory Visit (HOSPITAL_COMMUNITY): Payer: Self-pay | Admitting: Psychiatry

## 2013-06-05 NOTE — Telephone Encounter (Signed)
Refilled seroquel.

## 2013-06-14 ENCOUNTER — Other Ambulatory Visit (HOSPITAL_COMMUNITY): Payer: Self-pay | Admitting: Psychiatry

## 2013-07-09 ENCOUNTER — Ambulatory Visit (HOSPITAL_COMMUNITY): Payer: Self-pay | Admitting: Psychiatry

## 2013-07-09 ENCOUNTER — Telehealth (HOSPITAL_COMMUNITY): Payer: Self-pay

## 2013-07-09 DIAGNOSIS — F329 Major depressive disorder, single episode, unspecified: Secondary | ICD-10-CM

## 2013-07-12 MED ORDER — SERTRALINE HCL 100 MG PO TABS: 200.0000 mg | ORAL_TABLET | Freq: Every day | ORAL | Status: DC

## 2013-07-12 MED ORDER — QUETIAPINE FUMARATE 200 MG PO TABS: 200.0000 mg | ORAL_TABLET | Freq: Every day | ORAL | Status: DC

## 2013-07-12 NOTE — Telephone Encounter (Signed)
Refilled patient's medication.

## 2013-10-21 ENCOUNTER — Other Ambulatory Visit (HOSPITAL_COMMUNITY): Payer: Self-pay | Admitting: Psychiatry

## 2013-11-26 ENCOUNTER — Encounter (INDEPENDENT_AMBULATORY_CARE_PROVIDER_SITE_OTHER): Payer: Self-pay

## 2013-11-26 ENCOUNTER — Ambulatory Visit (INDEPENDENT_AMBULATORY_CARE_PROVIDER_SITE_OTHER): Payer: Medicare Other | Admitting: Psychiatry

## 2013-11-26 VITALS — BP 140/90 | HR 88 | Wt 212.0 lb

## 2013-11-26 DIAGNOSIS — F4310 Post-traumatic stress disorder, unspecified: Secondary | ICD-10-CM

## 2013-11-26 DIAGNOSIS — F329 Major depressive disorder, single episode, unspecified: Secondary | ICD-10-CM

## 2013-11-26 MED ORDER — SERTRALINE HCL 100 MG PO TABS: 200.0000 mg | ORAL_TABLET | Freq: Every day | ORAL | Status: DC

## 2013-11-26 MED ORDER — QUETIAPINE FUMARATE 200 MG PO TABS: 200.0000 mg | ORAL_TABLET | Freq: Every day | ORAL | Status: DC
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
## 2013-12-27 ENCOUNTER — Encounter (HOSPITAL_COMMUNITY): Payer: Self-pay

## 2014-02-26 ENCOUNTER — Ambulatory Visit (HOSPITAL_COMMUNITY): Payer: Self-pay | Admitting: Psychiatry

## 2014-02-27 ENCOUNTER — Ambulatory Visit (INDEPENDENT_AMBULATORY_CARE_PROVIDER_SITE_OTHER): Payer: Medicare Other | Admitting: Psychiatry

## 2014-02-27 DIAGNOSIS — F4310 Post-traumatic stress disorder, unspecified: Secondary | ICD-10-CM

## 2014-02-27 DIAGNOSIS — F321 Major depressive disorder, single episode, moderate: Secondary | ICD-10-CM

## 2014-02-27 DIAGNOSIS — F329 Major depressive disorder, single episode, unspecified: Secondary | ICD-10-CM

## 2014-02-27 DIAGNOSIS — F331 Major depressive disorder, recurrent, moderate: Secondary | ICD-10-CM

## 2014-02-27 MED ORDER — QUETIAPINE FUMARATE 200 MG PO TABS: 200.0000 mg | ORAL_TABLET | Freq: Every day | ORAL | Status: DC

## 2014-02-27 MED ORDER — SERTRALINE HCL 100 MG PO TABS: 200.0000 mg | ORAL_TABLET | Freq: Every day | ORAL | Status: DC
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
## 2014-05-12 ENCOUNTER — Encounter (INDEPENDENT_AMBULATORY_CARE_PROVIDER_SITE_OTHER): Payer: Self-pay

## 2014-05-12 ENCOUNTER — Ambulatory Visit (INDEPENDENT_AMBULATORY_CARE_PROVIDER_SITE_OTHER): Payer: Medicare Other | Admitting: Psychiatry

## 2014-05-12 VITALS — BP 131/74 | HR 76 | Ht 63.0 in | Wt 216.0 lb

## 2014-05-12 DIAGNOSIS — F329 Major depressive disorder, single episode, unspecified: Secondary | ICD-10-CM

## 2014-05-12 DIAGNOSIS — F331 Major depressive disorder, recurrent, moderate: Secondary | ICD-10-CM

## 2014-05-12 DIAGNOSIS — F4310 Post-traumatic stress disorder, unspecified: Secondary | ICD-10-CM

## 2014-05-12 MED ORDER — SERTRALINE HCL 100 MG PO TABS
200.0000 mg | ORAL_TABLET | Freq: Every day | ORAL | Status: DC
Start: 2014-05-12 — End: 2014-10-02

## 2014-05-12 MED ORDER — QUETIAPINE FUMARATE 200 MG PO TABS: 200.0000 mg | ORAL_TABLET | Freq: Every day | ORAL | Status: DC
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
## 2014-06-02 ENCOUNTER — Ambulatory Visit (HOSPITAL_COMMUNITY): Payer: Self-pay | Admitting: Psychiatry

## 2014-07-14 ENCOUNTER — Ambulatory Visit (HOSPITAL_COMMUNITY): Payer: Self-pay | Admitting: Psychiatry

## 2014-07-22 ENCOUNTER — Encounter: Payer: Self-pay | Admitting: Physician Assistant

## 2014-07-22 ENCOUNTER — Ambulatory Visit (INDEPENDENT_AMBULATORY_CARE_PROVIDER_SITE_OTHER): Payer: Commercial Managed Care - HMO | Admitting: Physician Assistant

## 2014-07-22 VITALS — BP 120/61 | HR 67 | Ht 63.0 in | Wt 215.0 lb

## 2014-07-22 DIAGNOSIS — G629 Polyneuropathy, unspecified: Secondary | ICD-10-CM

## 2014-07-22 DIAGNOSIS — E118 Type 2 diabetes mellitus with unspecified complications: Secondary | ICD-10-CM

## 2014-07-22 DIAGNOSIS — E039 Hypothyroidism, unspecified: Secondary | ICD-10-CM

## 2014-07-22 DIAGNOSIS — G43009 Migraine without aura, not intractable, without status migrainosus: Secondary | ICD-10-CM | POA: Diagnosis not present

## 2014-07-22 LAB — POCT GLYCOSYLATED HEMOGLOBIN (HGB A1C): Hemoglobin A1C: 9.6

## 2014-07-22 MED ORDER — ALBIGLUTIDE 30 MG ~~LOC~~ PEN: 1.0000 "application " | PEN_INJECTOR | SUBCUTANEOUS | Status: DC

## 2014-07-22 NOTE — Patient Instructions (Signed)
Tanuzem once weekly injection.   Will get thyroid labs. Will call if need to change meds.   Dr. Berdine Addison, Dr. Jesse Fall, Dr. Lavella Hammock orders.

## 2014-07-23 ENCOUNTER — Other Ambulatory Visit: Payer: Self-pay | Admitting: *Deleted

## 2014-07-23 LAB — T4, FREE: Free T4: 1.26 ng/dL (ref 0.80–1.80)

## 2014-07-23 LAB — TSH: TSH: 1.699 u[IU]/mL (ref 0.350–4.500)

## 2014-07-23 MED ORDER — LEVOTHYROXINE SODIUM 175 MCG PO TABS: 175.0000 ug | ORAL_TABLET | Freq: Every day | ORAL | Status: DC

## 2014-07-28 DIAGNOSIS — E118 Type 2 diabetes mellitus with unspecified complications: Secondary | ICD-10-CM | POA: Insufficient documentation

## 2014-07-28 DIAGNOSIS — Z794 Long term (current) use of insulin: Secondary | ICD-10-CM | POA: Insufficient documentation

## 2014-07-28 DIAGNOSIS — G43009 Migraine without aura, not intractable, without status migrainosus: Secondary | ICD-10-CM | POA: Insufficient documentation

## 2014-07-29 ENCOUNTER — Telehealth: Payer: Self-pay | Admitting: Physician Assistant

## 2014-07-29 DIAGNOSIS — H353190 Nonexudative age-related macular degeneration, unspecified eye, stage unspecified: Secondary | ICD-10-CM | POA: Insufficient documentation

## 2014-07-29 MED ORDER — SITAGLIPTIN PHOS-METFORMIN HCL 50-1000 MG PO TABS: 1.0000 | ORAL_TABLET | Freq: Two times a day (BID) | ORAL | Status: DC

## 2014-07-29 NOTE — Progress Notes (Signed)
Subjective:    Patient ID: Autumn Bowen, female    DOB: 1946-04-11, 69 y.o.   MRN: [id number]  HPI  Patient is a 69 year old female who presents to the clinic to establish care.  .. Active Ambulatory Problems    Diagnosis Date Noted  . Hypothyroidism 05/30/1988  . VITAMIN D DEFICIENCY 11/06/2008  . DYSLIPIDEMIA 10/16/2008  . ANEMIA 10/23/2008  . Major depressive disorder 11/05/2008  . TOBACCO ABUSE 10/15/2008  . POST TRAUMATIC STRESS SYNDROME 05/30/2000  . VISION DISORDER 12/03/2008  . ALLERGIC RHINITIS 10/15/2008  . EMPHYSEMA 05/11/2009  . COPD 05/10/2009  . OSTEOPENIA 11/18/2008  . FATIGUE 11/05/2008  . DIARRHEA 07/01/2007  . FRACTURE, ANKLE, RIGHT 04/29/2008  . POSTMENOPAUSAL STATUS 05/30/1968  . Type 2 diabetes mellitus with complication 62/69/4854  . Migraine without aura and without status migrainosus, not intractable 07/28/2014  . Macular degeneration, dry 07/29/2014   Resolved Ambulatory Problems    Diagnosis Date Noted  . No Resolved Ambulatory Problems   Past Medical History  Diagnosis Date  . Diabetes mellitus type II   . Dyslipidemia   . Anemia   . Celiac disease   . Sore throat   . Diverticulitis   . Celiac disease    .Marland Kitchen Family History  Problem Relation Age of Onset  . Anxiety disorder Mother   . Depression Mother   . Heart attack Mother   . Stroke Mother   . Diabetes type II Mother    .Marland Kitchen History   Social History  . Marital Status: Divorced    Spouse Name: N/A  . Number of Children: N/A  . Years of Education: N/A   Occupational History  . Not on file.   Social History Main Topics  . Smoking status: Current Every Day Smoker -- 0.30 packs/day for 50 years    Types: Cigarettes    Start date: 01/02/2012  . Smokeless tobacco: Not on file  . Alcohol Use: No  . Drug Use: No  . Sexual Activity: No   Other Topics Concern  . Not on file   Social History Narrative   Patient needs refills today. She does have diabetes. She is not  checking her sugars regularly. She does take Janumet twice daily.   Review of Systems  All other systems reviewed and are negative.      Objective:   Physical Exam  Constitutional: She is oriented to person, place, and time. She appears well-developed and well-nourished.  Obese.   HENT:  Head: Normocephalic and atraumatic.  Cardiovascular: Normal rate, regular rhythm and normal heart sounds.   Pulmonary/Chest: Effort normal and breath sounds normal.  Neurological: She is alert and oriented to person, place, and time.  Skin: Skin is dry.  Psychiatric: She has a normal mood and affect. Her behavior is normal.          Assessment & Plan:  DM, type II, uncontrolled with complications- .Marland Kitchen Lab Results  Component Value Date   HGBA1C 9.6 07/22/2014   There are some health maintenance items that we need to discuss. We do not have time of this visit. Discuss patient she needs to follow-up at a complete physical oral have more time to discuss. Patient was encouraged to start checking her sugars more regularly in the morning before she's had anything to eat. Goal would be fasting sugars between 90 and 120. Continue Janumet. We did add on a once weekly GLP-1, tanzeum. No samples today. Discuss side effects of medication. Can come back to  teach or ask pharmacist how to use device.    Discussed the importance of diabetic diet. Will follow-up in 3 months.  Hypothyroidism- will check labs and adjust medications accordingly.   Migraine without aura and without status migrainousus, not intractable- controlled with propranolol and naproxen as needed. Refills given.   Needs CPE and fasting labs.

## 2014-07-29 NOTE — Telephone Encounter (Signed)
Dr. Lavella Hammock opthamology needs last eye exam.

## 2014-07-29 NOTE — Telephone Encounter (Signed)
Dr. Clydene Laming for colonoscopy done in 2015.

## 2014-07-30 ENCOUNTER — Encounter: Payer: Self-pay | Admitting: Physician Assistant

## 2014-07-30 DIAGNOSIS — D126 Benign neoplasm of colon, unspecified: Secondary | ICD-10-CM | POA: Insufficient documentation

## 2014-07-30 NOTE — Telephone Encounter (Signed)
Called both offices, they are sending notes.

## 2014-08-19 ENCOUNTER — Other Ambulatory Visit: Payer: Self-pay | Admitting: Physician Assistant

## 2014-10-01 ENCOUNTER — Other Ambulatory Visit: Payer: Self-pay | Admitting: Physician Assistant

## 2014-10-02 ENCOUNTER — Ambulatory Visit (INDEPENDENT_AMBULATORY_CARE_PROVIDER_SITE_OTHER): Payer: Commercial Managed Care - HMO | Admitting: Psychiatry

## 2014-10-02 VITALS — HR 80 | Ht 63.0 in | Wt 216.0 lb

## 2014-10-02 DIAGNOSIS — F331 Major depressive disorder, recurrent, moderate: Secondary | ICD-10-CM

## 2014-10-02 DIAGNOSIS — F4310 Post-traumatic stress disorder, unspecified: Secondary | ICD-10-CM

## 2014-10-02 DIAGNOSIS — F4323 Adjustment disorder with mixed anxiety and depressed mood: Secondary | ICD-10-CM

## 2014-10-02 MED ORDER — SERTRALINE HCL 100 MG PO TABS: 200.0000 mg | ORAL_TABLET | Freq: Every day | ORAL | Status: DC

## 2014-10-02 MED ORDER — QUETIAPINE FUMARATE 200 MG PO TABS: 200.0000 mg | ORAL_TABLET | Freq: Every day | ORAL | Status: DC
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
## 2014-10-03 ENCOUNTER — Ambulatory Visit (INDEPENDENT_AMBULATORY_CARE_PROVIDER_SITE_OTHER): Payer: Commercial Managed Care - HMO | Admitting: Physician Assistant

## 2014-10-03 ENCOUNTER — Encounter: Payer: Self-pay | Admitting: Physician Assistant

## 2014-10-03 VITALS — BP 98/63 | HR 73 | Ht 63.0 in | Wt 211.0 lb

## 2014-10-03 DIAGNOSIS — E118 Type 2 diabetes mellitus with unspecified complications: Secondary | ICD-10-CM

## 2014-10-03 DIAGNOSIS — B351 Tinea unguium: Secondary | ICD-10-CM

## 2014-10-03 DIAGNOSIS — G629 Polyneuropathy, unspecified: Secondary | ICD-10-CM | POA: Diagnosis not present

## 2014-10-03 DIAGNOSIS — Z79899 Other long term (current) drug therapy: Secondary | ICD-10-CM

## 2014-10-03 DIAGNOSIS — J302 Other seasonal allergic rhinitis: Secondary | ICD-10-CM

## 2014-10-03 DIAGNOSIS — L600 Ingrowing nail: Secondary | ICD-10-CM

## 2014-10-03 DIAGNOSIS — Z1239 Encounter for other screening for malignant neoplasm of breast: Secondary | ICD-10-CM

## 2014-10-03 DIAGNOSIS — Z131 Encounter for screening for diabetes mellitus: Secondary | ICD-10-CM

## 2014-10-03 LAB — POCT GLYCOSYLATED HEMOGLOBIN (HGB A1C): Hemoglobin A1C: 9.2

## 2014-10-03 LAB — POCT UA - MICROALBUMIN
Albumin/Creatinine Ratio, Urine, POC: 30
Creatinine, POC: 300 mg/dL
Microalbumin Ur, POC: 30 mg/L

## 2014-10-03 MED ORDER — CHLORPHENIRAMINE MALEATE 4 MG PO TABS: 4.0000 mg | ORAL_TABLET | Freq: Two times a day (BID) | ORAL | Status: DC | PRN

## 2014-10-03 MED ORDER — METHYLPREDNISOLONE SODIUM SUCC 125 MG IJ SOLR
125.0000 mg | Freq: Once | INTRAMUSCULAR | Status: AC
  Administered 2014-10-03: 125 mg via INTRAMUSCULAR

## 2014-10-03 MED ORDER — PREGABALIN 50 MG PO CAPS: 50.0000 mg | ORAL_CAPSULE | Freq: Three times a day (TID) | ORAL | Status: DC

## 2014-10-03 MED ORDER — EZETIMIBE-SIMVASTATIN 10-40 MG PO TABS: ORAL_TABLET | ORAL | Status: DC

## 2014-10-03 NOTE — Progress Notes (Signed)
   Subjective:    Patient ID: Autumn Bowen, female    DOB: 1946-05-30, 69 y.o.   MRN: [id number]  HPI  Pt presents to the clinic to follow up on medications and with some concerns.   Not quite been 3 months. Pt is checking sugars some and running 126-200.  She never started tanzeum because she was confused on how to use and insurance will not may for it. She is on janumet and watching her diet. She denies any hypoglycemic events but does have episodes of instablility. She has really bad neuropathy of feet and out of lyrica needs refills.   Hyperlipidemia- needs refills currently not taking.   Her eyes, throat, nasal congestion feel itching. No fever, chills, cough, wheezing or SOB. Hx of allergies. Taking xyzal.   She does have a painful great right toe.      Review of Systems  All other systems reviewed and are negative.      Objective:   Physical Exam  Constitutional: She is oriented to person, place, and time. She appears well-developed and well-nourished.  HENT:  Head: Normocephalic and atraumatic.  Eyes:  Watery itchy red eyes bilaterally.   Neck: Normal range of motion. Neck supple.  Cardiovascular: Normal rate, regular rhythm and normal heart sounds.   Pulmonary/Chest: Effort normal and breath sounds normal.  Lymphadenopathy:    She has no cervical adenopathy.  Neurological: She is alert and oriented to person, place, and time.  Skin: Skin is dry.  Great right toe harden, yellow and thick with what appears to be lateral great toe ingrown. No erythema or warmth. No infection but tender to palpation.   Psychiatric: She has a normal mood and affect. Her behavior is normal.          Assessment & Plan:  DM, type 2 uncontrolled- .[facility] Kitchen Lab Results  Component Value Date   HGBA1C 9.2 10/03/2014   Is down from 9.6 not quite 3 months ago. She has also not been taking her teens and because insurance will not pay for it. Discussed we are going to have to start insulin. She  does have [employer] and we are unsure what they were pay for. Patient is going to bring her [employer] book in for a nurse visit and we will get her on insulin. We should have one of our nurses teach her how to use the insulin pen. We'll start at 10 units at bedtime for the first 7 days then increase by 2 units every 5 days until fasting blood sugars are between 100-130. Continue Janumet.  Insurance will not pay for tanzuem. She is going to bring in one of her books to look at if they will cover any GLP-1.  Declined pneumonia shot today.  Hyperlipidemia- vytorin refilled duscussed needed labs to make sure tolerating well.   Neuropathy- refilled lyrica for pt to start back on. If reaches 71m TID dose and symptoms not controlled then follow up we can titrate up to much higher levels. Reminded to wear good supportive shoes.   Great right toe ingrown with onychomycosis- patient request to have whole toenail removed. I referred her to come back to see Dr.thekkendam to have full toenail removed.   Seasonal allergies- solumedrol 1211mwas given today. Offered to give her optivar for itchy eyes. Pt declined. Chlor-trimeton was given for next 7 days to help with allergies. Continue xyzal. She declines any nasal sprays.    Ordered mammogram for patient.

## 2014-10-04 ENCOUNTER — Telehealth: Payer: Self-pay | Admitting: Physician Assistant

## 2014-10-04 DIAGNOSIS — J302 Other seasonal allergic rhinitis: Secondary | ICD-10-CM | POA: Insufficient documentation

## 2014-10-04 DIAGNOSIS — L600 Ingrowing nail: Secondary | ICD-10-CM | POA: Insufficient documentation

## 2014-10-04 DIAGNOSIS — G629 Polyneuropathy, unspecified: Secondary | ICD-10-CM | POA: Insufficient documentation

## 2014-10-04 MED ORDER — AMBULATORY NON FORMULARY MEDICATION: Status: DC

## 2014-10-04 MED ORDER — GLUCOSE BLOOD VI STRP: ORAL_STRIP | Status: DC

## 2014-10-04 NOTE — Telephone Encounter (Signed)
Can we please make sure we communicate with patient. We need to start basal insulin and I would like to keep on GLP-1. [employer] is giving [place] problems with what they will pay for. I told pt to bring in book. i just want to make sure she gets started on something.

## 2014-11-04 ENCOUNTER — Ambulatory Visit: Payer: Commercial Managed Care - HMO | Admitting: Sports Medicine

## 2014-11-04 DIAGNOSIS — E11319 Type 2 diabetes mellitus with unspecified diabetic retinopathy without macular edema: Secondary | ICD-10-CM

## 2014-11-04 NOTE — Progress Notes (Signed)
Patient walked into clinic today to get education on administration of her Albiglutide weekly injection. Pt was shown at appt on 10/03/14 how to self administer injection. Pt then tried to give her own injection the following week (10/10/14) at home and states "the liquid just kept coming out after I had removed the needle from my skin." With the assistance of Tonya, Amber and I learned how to properly mix injection and prep for administration. The patient preformed all steps in this process. Pt then successfully injected Rx with no complications. The Pt's insurance states they are no longer going to cover this Rx, advised her to contact them to see which Rx they prefer and we will get that information to the Provider for review. Pt then began discussing a problem with toenail fungus and redness under her breast. Pt states at last visit she was advised she would likely have to get that toenail removed, already had had a toenail removed from her other foot. Advised Pt to schedule an appt with Dr. Dianah Field for toenail removal. As for redness under breast, Amber advised Pt to use and OTC yeast cream at night and cornstarch/powder during the day to soak up any moisture which may be leading to the redness/irritation. There were no further questions at this time.

## 2014-11-05 ENCOUNTER — Telehealth: Payer: Self-pay | Admitting: Family Medicine

## 2014-11-05 NOTE — Telephone Encounter (Signed)
Pt was in office to have a nurse show her again how to use the tanzeum.  She was advised then to contact her ins co to find out what they prefer and to let us know.

## 2014-11-05 NOTE — Telephone Encounter (Signed)
Autumn Bowen, Will you please ask patient to clarify what condition or ailment she's requesting a handicap placard for?  I'm only asking because I've never seen her and Luvenia Starch is not available.

## 2014-11-06 NOTE — Telephone Encounter (Signed)
Message left on vm 

## 2014-11-11 NOTE — Telephone Encounter (Signed)
Closing encounter

## 2014-11-17 ENCOUNTER — Ambulatory Visit (INDEPENDENT_AMBULATORY_CARE_PROVIDER_SITE_OTHER): Payer: Commercial Managed Care - HMO

## 2014-11-17 ENCOUNTER — Ambulatory Visit (INDEPENDENT_AMBULATORY_CARE_PROVIDER_SITE_OTHER): Payer: Commercial Managed Care - HMO | Admitting: Sports Medicine

## 2014-11-17 ENCOUNTER — Other Ambulatory Visit (HOSPITAL_COMMUNITY): Payer: Self-pay | Admitting: Psychiatry

## 2014-11-17 ENCOUNTER — Encounter: Payer: Self-pay | Admitting: Sports Medicine

## 2014-11-17 ENCOUNTER — Telehealth: Payer: Self-pay | Admitting: Family Medicine

## 2014-11-17 VITALS — BP 149/79 | HR 85 | Ht 63.0 in | Wt 211.0 lb

## 2014-11-17 DIAGNOSIS — M19072 Primary osteoarthritis, left ankle and foot: Secondary | ICD-10-CM

## 2014-11-17 DIAGNOSIS — L600 Ingrowing nail: Secondary | ICD-10-CM | POA: Diagnosis not present

## 2014-11-17 DIAGNOSIS — M2012 Hallux valgus (acquired), left foot: Secondary | ICD-10-CM | POA: Diagnosis not present

## 2014-11-17 DIAGNOSIS — M21071 Valgus deformity, not elsewhere classified, right ankle: Secondary | ICD-10-CM

## 2014-11-17 DIAGNOSIS — E118 Type 2 diabetes mellitus with unspecified complications: Secondary | ICD-10-CM

## 2014-11-17 DIAGNOSIS — M7731 Calcaneal spur, right foot: Secondary | ICD-10-CM | POA: Diagnosis not present

## 2014-11-17 DIAGNOSIS — M21072 Valgus deformity, not elsewhere classified, left ankle: Secondary | ICD-10-CM | POA: Diagnosis not present

## 2014-11-17 DIAGNOSIS — M19071 Primary osteoarthritis, right ankle and foot: Secondary | ICD-10-CM

## 2014-11-17 DIAGNOSIS — M19079 Primary osteoarthritis, unspecified ankle and foot: Secondary | ICD-10-CM

## 2014-11-17 MED ORDER — HYDROCODONE-ACETAMINOPHEN 5-325 MG PO TABS: 1.0000 | ORAL_TABLET | Freq: Three times a day (TID) | ORAL | Status: DC | PRN

## 2014-11-17 MED ORDER — DULAGLUTIDE 0.75 MG/0.5ML ~~LOC~~ SOAJ: 0.7500 mg | SUBCUTANEOUS | Status: DC

## 2014-11-17 NOTE — Progress Notes (Signed)
   Subjective:    I'm seeing this patient as a consultation for:   Autumn Planas, PA-C  CC:  Toenail removal  HPI: This is a pleasant 69 year old female, she has an ingrown left great toenail, she desires excision. She did have excision on the contralateral side, has had a good response but it was a very painful procedure.  Toe pain: Bilateral at the MTP, moderate, persistent, no radiation.  Lumbar spinal stenosis: like me to start treating this as well. Pain is moderate, persistent without radiation. No bowel or bladder dysfunction or saddle numbness.  Past medical history, Surgical history, Family history not pertinant except as noted below, Social history, Allergies, and medications have been entered into the medical record, reviewed, and no changes needed.   Review of Systems: No headache, visual changes, nausea, vomiting, diarrhea, constipation, dizziness, abdominal pain, skin rash, fevers, chills, night sweats, weight loss, swollen lymph nodes, body aches, joint swelling, muscle aches, chest pain, shortness of breath, mood changes, visual or auditory hallucinations.   Objective:   General: Well Developed, well nourished, and in no acute distress.  Neuro/Psych: Alert and oriented x3, extra-ocular muscles intact, able to move all 4 extremities, sensation grossly intact. Skin: Warm and dry, no rashes noted.  Respiratory: Not using accessory muscles, speaking in full sentences, trachea midline.  Cardiovascular: Pulses palpable, no extremity edema. Abdomen: Does not appear distended. Left foot :  Ingrown great toenail. No signs of bacterial superinfection.  Procedure:  Removal of left great toenail. Risks, benefits, alternatives explained to patient. Consent obtained. Time out conducted. Noted no overlying induration or erythema at site of injection. Toe cleaned with alcohol, then a total of 10cc lidocaine 1% infiltrated at adjacent webspaces at the location of the bifurcation of the  common digital nerve to proper digital nerves.  Some lidocaine also infiltrated at hyponychium and under nail bed.  Adequate anesthesia ensured. Toe prepped and draped in a sterile fashion. Nail elevator used to separate nail plate from nail bed. Hemostat then used to separate nail fragment from surrounding structures. Nail bed and matrix treated with phenol. Minor bleeding controlled with pressure and phenol. Antibiotic ointment applied. Toe dressed. Advised to return if increased redness, swelling, drainage, fevers, or chills.  Impression and Recommendations:   This case required medical decision making of moderate complexity.

## 2014-11-17 NOTE — Assessment & Plan Note (Signed)
Patient will return for bilateral injection. X-rays will also be performed.

## 2014-11-17 NOTE — Telephone Encounter (Signed)
Seth Bake, Will you please let patient know that [employer] is no longer covering her Tanzeum injection but it will cover a similar injection called Trulicity that [facility].  It is a once a week injection.

## 2014-11-17 NOTE — Assessment & Plan Note (Signed)
Nail plate removal with phenol treatment as above. Return in 2 weeks to recheck wound. Vicodin for pain.

## 2014-11-18 NOTE — Telephone Encounter (Signed)
Pt.notified

## 2014-11-20 ENCOUNTER — Ambulatory Visit (HOSPITAL_COMMUNITY): Payer: Self-pay | Admitting: Psychiatry

## 2014-11-20 NOTE — Telephone Encounter (Signed)
Received request from [facility] for a refill for Seroquel. Per Dr. De Nurse, request for Seroquel is denied. Pt will need to schedule an appt w/ office.

## 2014-12-03 ENCOUNTER — Other Ambulatory Visit: Payer: Self-pay | Admitting: Physician Assistant

## 2014-12-04 ENCOUNTER — Encounter: Payer: Self-pay | Admitting: Sports Medicine

## 2014-12-04 ENCOUNTER — Ambulatory Visit (INDEPENDENT_AMBULATORY_CARE_PROVIDER_SITE_OTHER): Payer: Commercial Managed Care - HMO | Admitting: Sports Medicine

## 2014-12-04 ENCOUNTER — Ambulatory Visit (HOSPITAL_COMMUNITY): Payer: Self-pay | Admitting: Psychiatry

## 2014-12-04 VITALS — BP 87/59 | HR 67 | Wt 209.0 lb

## 2014-12-04 DIAGNOSIS — M47812 Spondylosis without myelopathy or radiculopathy, cervical region: Secondary | ICD-10-CM

## 2014-12-04 DIAGNOSIS — M1990 Unspecified osteoarthritis, unspecified site: Secondary | ICD-10-CM | POA: Diagnosis not present

## 2014-12-04 DIAGNOSIS — L600 Ingrowing nail: Secondary | ICD-10-CM

## 2014-12-04 DIAGNOSIS — M19079 Primary osteoarthritis, unspecified ankle and foot: Secondary | ICD-10-CM

## 2014-12-04 NOTE — Assessment & Plan Note (Signed)
Return in 2 weeks, that point the surgical scar will be healed enough rest to do bilateral first metatarsophalangeal joint injections.

## 2014-12-04 NOTE — Assessment & Plan Note (Signed)
With left-sided cervical radiculopathy, seems to be a C8 distribution. We can discuss this further a future visit. She had multiple questions which were deferred until the next time.

## 2014-12-04 NOTE — Progress Notes (Signed)
  Subjective:    CC: follow-up  HPI: Onychodystrophy: Doing well after removal of the left great toenail.  Bilateral tarsometatarsal osteoarthritis: Amenable to wait a couple of more weeks before considering injection.pain is mild, persistent.  Neck pain: Amenable to discussed this at a future visit.  Past medical history, Surgical history, Family history not pertinant except as noted below, Social history, Allergies, and medications have been entered into the medical record, reviewed, and no changes needed.   Review of Systems: No fevers, chills, night sweats, weight loss, chest pain, or shortness of breath.   Objective:    General: Well Developed, well nourished, and in no acute distress.  Neuro: Alert and oriented x3, extra-ocular muscles intact, sensation grossly intact.  HEENT: Normocephalic, atraumatic, pupils equal round reactive to light, neck supple, no masses, no lymphadenopathy, thyroid nonpalpable.  Skin: Warm and dry, no rashes. Cardiac: Regular rate and rhythm, no murmurs rubs or gallops, no lower extremity edema.  Respiratory: Clear to auscultation bilaterally. Not using accessory muscles, speaking in full sentences. Left Foot: No visible erythema or swelling. Range of motion is full in all directions. Strength is 5/5 in all directions. No hallux valgus. No pes cavus or pes planus. No abnormal callus noted. No pain over the navicular prominence, or base of fifth metatarsal. No tenderness to palpation of the calcaneal insertion of plantar fascia. No pain at the Achilles insertion. No pain over the calcaneal bursa. No pain of the retrocalcaneal bursa. No tenderness to palpation over the tarsals, metatarsals, or phalanges. No hallux rigidus or limitus. No tenderness palpation over interphalangeal joints. No pain with compression of the metatarsal heads. Neurovascularly intact distally. Nail plate excision appears to be healing well, no signs of bacterial  superinfection or purulence.  Impression and Recommendations:

## 2014-12-04 NOTE — Assessment & Plan Note (Signed)
Doing extremely well post nail plate excision with phenol treatment.

## 2014-12-11 ENCOUNTER — Other Ambulatory Visit (HOSPITAL_COMMUNITY): Payer: Self-pay | Admitting: Psychiatry

## 2014-12-15 NOTE — Telephone Encounter (Signed)
Pt called for a refill for Zoloft 178m. Per Dr. ADe Nurse pt is authorized is Zoloft 104m Qty 6089Prescription was sent to pharmacy. Pt has a f/u appt 8/11. Called and informed pt of prescription status. Pt states and shows understanding.

## 2014-12-17 ENCOUNTER — Other Ambulatory Visit: Payer: Self-pay | Admitting: Physician Assistant

## 2014-12-18 ENCOUNTER — Encounter: Payer: Self-pay | Admitting: Sports Medicine

## 2014-12-18 ENCOUNTER — Ambulatory Visit (INDEPENDENT_AMBULATORY_CARE_PROVIDER_SITE_OTHER): Payer: Commercial Managed Care - HMO | Admitting: Sports Medicine

## 2014-12-18 ENCOUNTER — Other Ambulatory Visit: Payer: Self-pay | Admitting: Physician Assistant

## 2014-12-18 VITALS — BP 125/74 | HR 86 | Ht 63.0 in | Wt 212.0 lb

## 2014-12-18 DIAGNOSIS — M1990 Unspecified osteoarthritis, unspecified site: Secondary | ICD-10-CM | POA: Diagnosis not present

## 2014-12-18 DIAGNOSIS — Z79899 Other long term (current) drug therapy: Secondary | ICD-10-CM | POA: Diagnosis not present

## 2014-12-18 DIAGNOSIS — M19079 Primary osteoarthritis, unspecified ankle and foot: Secondary | ICD-10-CM

## 2014-12-18 DIAGNOSIS — L600 Ingrowing nail: Secondary | ICD-10-CM

## 2014-12-18 DIAGNOSIS — E118 Type 2 diabetes mellitus with unspecified complications: Secondary | ICD-10-CM | POA: Diagnosis not present

## 2014-12-18 NOTE — Progress Notes (Signed)
  Subjective:    CC: follow-up  HPI: Toenail excision: 2 weeks post excision of the left great nail plate with phenol, doing very well.  Bilateral metatarsophalangeal joint osteoarthritis: Has failed NSAIDs, supportive footwear, amenable to try interventional treatment.  Past medical history, Surgical history, Family history not pertinant except as noted below, Social history, Allergies, and medications have been entered into the medical record, reviewed, and no changes needed.   Review of Systems: No fevers, chills, night sweats, weight loss, chest pain, or shortness of breath.   Objective:    General: Well Developed, well nourished, and in no acute distress.  Neuro: Alert and oriented x3, extra-ocular muscles intact, sensation grossly intact.  HEENT: Normocephalic, atraumatic, pupils equal round reactive to light, neck supple, no masses, no lymphadenopathy, thyroid nonpalpable.  Skin: Warm and dry, no rashes. Cardiac: Regular rate and rhythm, no murmurs rubs or gallops, no lower extremity edema.  Respiratory: Clear to auscultation bilaterally. Not using accessory muscles, speaking in full sentences. Left foot: Granulation tissue has resolved, there is a small area of remaining granulation tissue but no evidence of bacterial infection, slightly tender. No erythema or drainage. There is bilateral tenderness at the first MTPs.  Procedure: Real-time Ultrasound Guided Injection of left first metatarsal-phalangeal joint Device: GE Logiq E  Verbal informed consent obtained.  Time-out conducted.  Noted no overlying erythema, induration, or other signs of local infection.  Skin prepped in a sterile fashion.  Local anesthesia: Topical Ethyl chloride.  With sterile technique and under real time ultrasound guidance:  25-gauge needle advanced into the joint, noted a small effusion, 0.5 mL kenalog 40, 0.5 mL lidocaine injected easily. Completed without difficulty  Pain immediately resolved  suggesting accurate placement of the medication.  Advised to call if fevers/chills, erythema, induration, drainage, or persistent bleeding.  Images permanently stored and available for review in the ultrasound unit.  Impression: Technically successful ultrasound guided injection.  Procedure: Real-time Ultrasound Guided Injection of right first metatarsal-phalangeal joint Device: GE Logiq E  Verbal informed consent obtained.  Time-out conducted.  Noted no overlying erythema, induration, or other signs of local infection.  Skin prepped in a sterile fashion.  Local anesthesia: Topical Ethyl chloride.  With sterile technique and under real time ultrasound guidance:  25-gauge needle advanced into the joint, noted a small effusion, 0.5 mL kenalog 40, 0.5 mL lidocaine injected easily. Completed without difficulty  Pain immediately resolved suggesting accurate placement of the medication.  Advised to call if fevers/chills, erythema, induration, drainage, or persistent bleeding.  Images permanently stored and available for review in the ultrasound unit.  Impression: Technically successful ultrasound guided injection.  Impression and Recommendations:

## 2014-12-18 NOTE — Assessment & Plan Note (Signed)
Bilateral first MTP injection. Next line return in one month.

## 2014-12-18 NOTE — Assessment & Plan Note (Signed)
Doing extremely well, left nail bed is almost completely epithelialized. No signs of bacterial superinfection.

## 2014-12-19 ENCOUNTER — Other Ambulatory Visit (HOSPITAL_COMMUNITY): Payer: Self-pay | Admitting: Psychiatry

## 2014-12-19 ENCOUNTER — Other Ambulatory Visit: Payer: Self-pay | Admitting: Physician Assistant

## 2014-12-19 LAB — COMPLETE METABOLIC PANEL WITH GFR
ALT: 26 U/L (ref 0–35)
AST: 25 U/L (ref 0–37)
Albumin: 3.9 g/dL (ref 3.5–5.2)
Alkaline Phosphatase: 55 U/L (ref 39–117)
BUN: 11 mg/dL (ref 6–23)
CO2: 29 mEq/L (ref 19–32)
Calcium: 9.7 mg/dL (ref 8.4–10.5)
Chloride: 102 mEq/L (ref 96–112)
Creat: 0.54 mg/dL (ref 0.50–1.10)
GFR, Est African American: >89 mL/min
GFR, Est Non African American: >89 mL/min
Glucose, Bld: 142 mg/dL — ABNORMAL HIGH (ref 70–99)
Potassium: 4.7 mEq/L (ref 3.5–5.3)
Sodium: 141 mEq/L (ref 135–145)
Total Bilirubin: 0.4 mg/dL (ref 0.2–1.2)
Total Protein: 6.5 g/dL (ref 6.0–8.3)

## 2014-12-19 LAB — LIPID PANEL
Cholesterol: 130 mg/dL (ref 0–200)
HDL: 54 mg/dL (ref >=46)
LDL Cholesterol: 37 mg/dL (ref 0–99)
Total CHOL/HDL Ratio: 2.4 Ratio
Triglycerides: 194 mg/dL — ABNORMAL HIGH (ref <150)
VLDL: 39 mg/dL (ref 0–40)

## 2014-12-19 NOTE — Telephone Encounter (Signed)
PT called for a refill for Seroquel 238m. Per Dr. ADe Nurse pt is authorized for a refill Seroquel 2056m Qty 30. [facility]. Called and informed pt of prescription status.

## 2015-01-08 ENCOUNTER — Ambulatory Visit (INDEPENDENT_AMBULATORY_CARE_PROVIDER_SITE_OTHER): Payer: Medicare HMO | Admitting: Psychiatry

## 2015-01-08 ENCOUNTER — Encounter (HOSPITAL_COMMUNITY): Payer: Self-pay | Admitting: Psychiatry

## 2015-01-08 VITALS — BP 126/64 | HR 83 | Ht 63.0 in | Wt 206.0 lb

## 2015-01-08 DIAGNOSIS — F4310 Post-traumatic stress disorder, unspecified: Secondary | ICD-10-CM

## 2015-01-08 DIAGNOSIS — F4322 Adjustment disorder with anxiety: Secondary | ICD-10-CM | POA: Diagnosis not present

## 2015-01-08 DIAGNOSIS — F4323 Adjustment disorder with mixed anxiety and depressed mood: Secondary | ICD-10-CM

## 2015-01-08 DIAGNOSIS — F331 Major depressive disorder, recurrent, moderate: Secondary | ICD-10-CM | POA: Diagnosis not present

## 2015-01-08 MED ORDER — QUETIAPINE FUMARATE 200 MG PO TABS: 200.0000 mg | ORAL_TABLET | Freq: Every day | ORAL | Status: DC

## 2015-01-08 MED ORDER — SERTRALINE HCL 100 MG PO TABS: ORAL_TABLET | ORAL | Status: DC
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
## 2015-01-15 ENCOUNTER — Ambulatory Visit (INDEPENDENT_AMBULATORY_CARE_PROVIDER_SITE_OTHER): Payer: Commercial Managed Care - HMO | Admitting: Sports Medicine

## 2015-01-15 VITALS — BP 108/68 | HR 89 | Wt 208.0 lb

## 2015-01-15 DIAGNOSIS — M19079 Primary osteoarthritis, unspecified ankle and foot: Secondary | ICD-10-CM

## 2015-01-15 DIAGNOSIS — L600 Ingrowing nail: Secondary | ICD-10-CM | POA: Diagnosis not present

## 2015-01-15 DIAGNOSIS — M1990 Unspecified osteoarthritis, unspecified site: Secondary | ICD-10-CM

## 2015-01-15 NOTE — Assessment & Plan Note (Signed)
Continues to heal.

## 2015-01-15 NOTE — Assessment & Plan Note (Signed)
Continues to improve after injection. Left side is 100% resolved, right side 70% resolved. She continues to walk with unsupportive slippers, I have recommended that she get a supportive athletic shoe, and return for custom orthotics likely with a first metatarsal ray post.

## 2015-01-15 NOTE — Progress Notes (Signed)
  Subjective:    CC: follow-up  HPI: Autumn Bowen returns, I took off her left great toenail sometime ago, the lesion continues to heal, and is completely epithelialized, near complete keratinization.  First metatarsophalangeal osteoarthritis: 100% better on the left side, 70% better on the right side. She commonly wears slippers, and does not wear any supportive shoes, she in fact tells me she does not own any supportive shoes other than slippers.  Past medical history, Surgical history, Family history not pertinant except as noted below, Social history, Allergies, and medications have been entered into the medical record, reviewed, and no changes needed.   Review of Systems: No fevers, chills, night sweats, weight loss, chest pain, or shortness of breath.   Objective:    General: Well Developed, well nourished, and in no acute distress.  Neuro: Alert and oriented x3, extra-ocular muscles intact, sensation grossly intact.  HEENT: Normocephalic, atraumatic, pupils equal round reactive to light, neck supple, no masses, no lymphadenopathy, thyroid nonpalpable.  Skin: Warm and dry, no rashes. Cardiac: Regular rate and rhythm, no murmurs rubs or gallops, no lower extremity edema.  Respiratory: Clear to auscultation bilaterally. Not using accessory muscles, speaking in full sentences. Feet: Left side shows continued healing with full epithelialization and partial keratinization of the nail bed, there is minimal tenderness over the nail bed itself. MTPs are nontender and with good motion. Range of motion is full in all directions. Strength is 5/5 in all directions. No hallux valgus. No pes cavus or pes planus. No abnormal callus noted. No pain over the navicular prominence, or base of fifth metatarsal. No tenderness to palpation of the calcaneal insertion of plantar fascia. No pain at the Achilles insertion. No pain over the calcaneal bursa. No pain of the retrocalcaneal bursa. No tenderness to  palpation over the tarsals, metatarsals, or phalanges. No hallux rigidus or limitus. No tenderness palpation over interphalangeal joints. No pain with compression of the metatarsal heads. Neurovascularly intact distally.  Impression and Recommendations:

## 2015-01-21 ENCOUNTER — Other Ambulatory Visit: Payer: Self-pay | Admitting: Physician Assistant

## 2015-01-23 ENCOUNTER — Ambulatory Visit: Payer: Self-pay | Admitting: Physician Assistant

## 2015-01-26 ENCOUNTER — Encounter: Payer: Self-pay | Admitting: Sports Medicine

## 2015-01-26 ENCOUNTER — Ambulatory Visit (INDEPENDENT_AMBULATORY_CARE_PROVIDER_SITE_OTHER): Payer: Commercial Managed Care - HMO | Admitting: Physician Assistant

## 2015-01-26 ENCOUNTER — Encounter: Payer: Self-pay | Admitting: Physician Assistant

## 2015-01-26 ENCOUNTER — Ambulatory Visit (INDEPENDENT_AMBULATORY_CARE_PROVIDER_SITE_OTHER): Payer: Commercial Managed Care - HMO | Admitting: Sports Medicine

## 2015-01-26 VITALS — BP 123/70 | HR 83 | Ht 63.0 in | Wt 207.0 lb

## 2015-01-26 DIAGNOSIS — G629 Polyneuropathy, unspecified: Secondary | ICD-10-CM

## 2015-01-26 DIAGNOSIS — E118 Type 2 diabetes mellitus with unspecified complications: Secondary | ICD-10-CM

## 2015-01-26 DIAGNOSIS — M19079 Primary osteoarthritis, unspecified ankle and foot: Secondary | ICD-10-CM

## 2015-01-26 DIAGNOSIS — K219 Gastro-esophageal reflux disease without esophagitis: Secondary | ICD-10-CM

## 2015-01-26 DIAGNOSIS — E039 Hypothyroidism, unspecified: Secondary | ICD-10-CM

## 2015-01-26 DIAGNOSIS — Z720 Tobacco use: Secondary | ICD-10-CM

## 2015-01-26 DIAGNOSIS — M5442 Lumbago with sciatica, left side: Secondary | ICD-10-CM

## 2015-01-26 DIAGNOSIS — K900 Celiac disease: Secondary | ICD-10-CM

## 2015-01-26 DIAGNOSIS — L821 Other seborrheic keratosis: Secondary | ICD-10-CM

## 2015-01-26 DIAGNOSIS — M1990 Unspecified osteoarthritis, unspecified site: Secondary | ICD-10-CM | POA: Diagnosis not present

## 2015-01-26 LAB — POCT GLYCOSYLATED HEMOGLOBIN (HGB A1C): Hemoglobin A1C: 7.9

## 2015-01-26 MED ORDER — FAMOTIDINE 20 MG PO TABS: 20.0000 mg | ORAL_TABLET | Freq: Every day | ORAL | Status: DC

## 2015-01-26 MED ORDER — EZETIMIBE-SIMVASTATIN 10-40 MG PO TABS
1.0000 | ORAL_TABLET | Freq: Every day | ORAL | Status: DC
Start: 2015-01-26 — End: 2016-05-11

## 2015-01-26 MED ORDER — PROPRANOLOL HCL ER 160 MG PO CP24: 160.0000 mg | ORAL_CAPSULE | Freq: Every day | ORAL | Status: DC

## 2015-01-26 MED ORDER — SITAGLIP PHOS-METFORMIN HCL ER 100-1000 MG PO TB24: 1.0000 | ORAL_TABLET | Freq: Every day | ORAL | Status: DC

## 2015-01-26 MED ORDER — ONDANSETRON HCL 8 MG PO TABS: 8.0000 mg | ORAL_TABLET | Freq: Three times a day (TID) | ORAL | Status: DC | PRN

## 2015-01-26 MED ORDER — PREGABALIN 50 MG PO CAPS: 50.0000 mg | ORAL_CAPSULE | Freq: Three times a day (TID) | ORAL | Status: DC

## 2015-01-26 NOTE — Assessment & Plan Note (Signed)
Continues to do okay after bilateral first MTP injections, custom orthotics as above with left-sided first metatarsal ray post. I'm also going to recommend that Autumn Bowen purchase and nighttime bunion regulator.

## 2015-01-26 NOTE — Patient Instructions (Signed)
Purchase a nighttime bunion regulator, small left, phone number is [phone]

## 2015-01-26 NOTE — Progress Notes (Signed)
    Patient was fitted for a : standard, cushioned, semi-rigid orthotic. The orthotic was heated and afterward the patient stood on the orthotic blank positioned on the orthotic stand. The patient was positioned in subtalar neutral position and 10 degrees of ankle dorsiflexion in a weight bearing stance. After completion of molding, a stable base was applied to the orthotic blank. The blank was ground to a stable position for weight bearing. Size: 7 Base: [employer] safety [employer] and [place]: Left-sided first metatarsal ray post The patient ambulated these, and they were very comfortable.  I spent 40 minutes with this patient, greater than 50% was face-to-face time counseling regarding the below diagnosis.

## 2015-01-26 NOTE — Patient Instructions (Signed)
Seborrheic Keratosis Seborrheic keratosis is a common, noncancerous (benign) skin growth that can occur anywhere on the skin.It looks like "stuck-on," waxy, rough, tan, brown, or black spots on the skin. These skin growths can be flat or raised.They are often called "barnacles" because of their pasted-on appearance.Usually, these skin growths appear in adulthood, around age 70, and increase in number as you age. They may also develop during pregnancy or following estrogen therapy. Many people may only have one growth appear in their lifetime, while some people may develop many growths. CAUSES It is unknown what causes these skin growths, but they appear to run in families. SYMPTOMS Seborrheic keratosis is often located on the face, chest, shoulders, back, or other areas. These growths are:  Usually painless, but may become irritated and itchy.  Yellow, brown, black, or other colors.  Slightly raised or have a flat surface.  Sometimes rough or wart-like in texture.  Often waxy on the surface.  Round or oval-shaped.  Sometimes "stuck-on" in appearance.  Sometimes single, but there are usually many growths. Any growth that bleeds, itches on a regular basis, becomes inflamed, or becomes irritated needs to be evaluated by a skin specialist (dermatologist). DIAGNOSIS Diagnosis is mainly based on the way the growths appear. In some cases, it can be difficult to tell this type of skin growth from skin cancer. A skin growth tissue sample (biopsy) may be used to confirm the diagnosis. TREATMENT Most often, treatment is not needed because the skin growths are benign.If the skin growth is irritated easily by clothing or jewelry, causing it to scab or bleed, treatment may be recommended. Patients may also choose to have the growths removed because they do not like their appearance. Most commonly, these growths are treated with cryosurgery. In cryosurgery, liquid nitrogen is applied to "freeze" the  growth. The growth usually falls off within a matter of days. A blister may form and dry into a scab that will also fall off. After the growth or scab falls off, it may leave a dark or light spot on the skin. This color may fade over time, or it may remain permanent on the skin. HOME CARE INSTRUCTIONS If the skin growths are treated with cryosurgery, the treated area needs to be kept clean with water and soap. SEEK MEDICAL CARE IF:  You have questions about these growths or other skin problems.  You develop new symptoms, including:  A change in the appearance of the skin growth.  New growths.  Any bleeding, itching, or pain in the growths.  A skin growth that looks similar to seborrheic keratosis. Document Released: 06/18/2010 Document Revised: 08/08/2011 Document Reviewed: 06/18/2010 [facility] Patient Information 2015 [place], [place]. This information is not intended to replace advice given to you by your health care provider. Make sure you discuss any questions you have with your health care provider. Low Back Sprain with Rehab  A sprain is an injury in which a ligament is torn. The ligaments of the lower back are vulnerable to sprains. However, they are strong and require great force to be injured. These ligaments are important for stabilizing the spinal column. Sprains are classified into three categories. Grade 1 sprains cause pain, but the tendon is not lengthened. Grade 2 sprains include a lengthened ligament, due to the ligament being stretched or partially ruptured. With grade 2 sprains there is still function, although the function may be decreased. Grade 3 sprains involve a complete tear of the tendon or muscle, and function is usually impaired.  SYMPTOMS   Severe pain in the lower back.  Sometimes, a feeling of a "pop," "snap," or tear, at the time of injury.  Tenderness and sometimes swelling at the injury site.  Uncommonly, bruising (contusion) within 48 hours of  injury.  Muscle spasms in the back. CAUSES  Low back sprains occur when a force is placed on the ligaments that is greater than they can handle. Common causes of injury include:  Performing a stressful act while off-balance.  Repetitive stressful activities that involve movement of the lower back.  Direct hit (trauma) to the lower back. RISK INCREASES WITH:  Contact sports (football, wrestling).  Collisions (major skiing accidents).  Sports that require throwing or lifting (baseball, weightlifting).  Sports involving twisting of the spine (gymnastics, diving, tennis, golf).  Poor strength and flexibility.  Inadequate protection.  Previous back injury or surgery (especially fusion). PREVENTION  Wear properly fitted and padded protective equipment.  Warm up and stretch properly before activity.  Allow for adequate recovery between workouts.  Maintain physical fitness:  Strength, flexibility, and endurance.  Cardiovascular fitness.  Maintain a healthy body weight. PROGNOSIS  If treated properly, low back sprains usually heal with non-surgical treatment. The length of time for healing depends on the severity of the injury.  RELATED COMPLICATIONS   Recurring symptoms, resulting in a chronic problem.  Chronic inflammation and pain in the low back.  Delayed healing or resolution of symptoms, especially if activity is resumed too soon.  Prolonged impairment.  Unstable or arthritic joints of the low back. TREATMENT  Treatment first involves the use of ice and medicine, to reduce pain and inflammation. The use of strengthening and stretching exercises may help reduce pain with activity. These exercises may be performed at home or with a therapist. Severe injuries may require referral to a therapist for further evaluation and treatment, such as ultrasound. Your caregiver may advise that you wear a back brace or corset, to help reduce pain and discomfort. Often, prolonged  bed rest results in greater harm then benefit. Corticosteroid injections may be recommended. However, these should be reserved for the most serious cases. It is important to avoid using your back when lifting objects. At night, sleep on your back on a firm mattress, with a pillow placed under your knees. If non-surgical treatment is unsuccessful, surgery may be needed.  MEDICATION   If pain medicine is needed, nonsteroidal anti-inflammatory medicines (aspirin and ibuprofen), or other minor pain relievers (acetaminophen), are often advised.  Do not take pain medicine for 7 days before surgery.  Prescription pain relievers may be given, if your caregiver thinks they are needed. Use only as directed and only as much as you need.  Ointments applied to the skin may be helpful.  Corticosteroid injections may be given by your caregiver. These injections should be reserved for the most serious cases, because they may only be given a certain number of times. HEAT AND COLD  Cold treatment (icing) should be applied for 10 to 15 minutes every 2 to 3 hours for inflammation and pain, and immediately after activity that aggravates your symptoms. Use ice packs or an ice massage.  Heat treatment may be used before performing stretching and strengthening activities prescribed by your caregiver, physical therapist, or athletic trainer. Use a heat pack or a warm water soak. SEEK MEDICAL CARE IF:   Symptoms get worse or do not improve in 2 to 4 weeks, despite treatment.  You develop numbness or weakness in either leg.  You lose bowel or bladder function.  Any of the following occur after surgery: fever, increased pain, swelling, redness, drainage of fluids, or bleeding in the affected area.  New, unexplained symptoms develop. (Drugs used in treatment may produce side effects.) EXERCISES  RANGE OF MOTION (ROM) AND STRETCHING EXERCISES - Low Back Sprain Most people with lower back pain will find that their  symptoms get worse with excessive bending forward (flexion) or arching at the lower back (extension). The exercises that will help resolve your symptoms will focus on the opposite motion.  Your physician, physical therapist or athletic trainer will help you determine which exercises will be most helpful to resolve your lower back pain. Do not complete any exercises without first consulting with your caregiver. Discontinue any exercises which make your symptoms worse, until you speak to your caregiver. If you have pain, numbness or tingling which travels down into your buttocks, leg or foot, the goal of the therapy is for these symptoms to move closer to your back and eventually resolve. Sometimes, these leg symptoms will get better, but your lower back pain may worsen. This is often an indication of progress in your rehabilitation. Be very alert to any changes in your symptoms and the activities in which you participated in the 24 hours prior to the change. Sharing this information with your caregiver will allow him or her to most efficiently treat your condition. These exercises may help you when beginning to rehabilitate your injury. Your symptoms may resolve with or without further involvement from your physician, physical therapist or athletic trainer. While completing these exercises, remember:   Restoring tissue flexibility helps normal motion to return to the joints. This allows healthier, less painful movement and activity.  An effective stretch should be held for at least 30 seconds.  A stretch should never be painful. You should only feel a gentle lengthening or release in the stretched tissue. FLEXION RANGE OF MOTION AND STRETCHING EXERCISES: STRETCH - Flexion, Single Knee to Chest   Lie on a firm bed or floor with both legs extended in front of you.  Keeping one leg in contact with the floor, bring your opposite knee to your chest. Hold your leg in place by either grabbing behind your  thigh or at your knee.  Pull until you feel a gentle stretch in your low back. Hold __________ seconds.  Slowly release your grasp and repeat the exercise with the opposite side. Repeat __________ times. Complete this exercise __________ times per day.  STRETCH - Flexion, Double Knee to Chest  Lie on a firm bed or floor with both legs extended in front of you.  Keeping one leg in contact with the floor, bring your opposite knee to your chest.  Tense your stomach muscles to support your back and then lift your other knee to your chest. Hold your legs in place by either grabbing behind your thighs or at your knees.  Pull both knees toward your chest until you feel a gentle stretch in your low back. Hold __________ seconds.  Tense your stomach muscles and slowly return one leg at a time to the floor. Repeat __________ times. Complete this exercise __________ times per day.  STRETCH - Low Trunk Rotation  Lie on a firm bed or floor. Keeping your legs in front of you, bend your knees so they are both pointed toward the ceiling and your feet are flat on the floor.  Extend your arms out to the side. This will stabilize your  upper body by keeping your shoulders in contact with the floor.  Gently and slowly drop both knees together to one side until you feel a gentle stretch in your low back. Hold for __________ seconds.  Tense your stomach muscles to support your lower back as you bring your knees back to the starting position. Repeat the exercise to the other side. Repeat __________ times. Complete this exercise __________ times per day  EXTENSION RANGE OF MOTION AND FLEXIBILITY EXERCISES: STRETCH - Extension, Prone on Elbows   Lie on your stomach on the floor, a bed will be too soft. Place your palms about shoulder width apart and at the height of your head.  Place your elbows under your shoulders. If this is too painful, stack pillows under your chest.  Allow your body to relax so that  your hips drop lower and make contact more completely with the floor.  Hold this position for __________ seconds.  Slowly return to lying flat on the floor. Repeat __________ times. Complete this exercise __________ times per day.  RANGE OF MOTION - Extension, Prone Press Ups  Lie on your stomach on the floor, a bed will be too soft. Place your palms about shoulder width apart and at the height of your head.  Keeping your back as relaxed as possible, slowly straighten your elbows while keeping your hips on the floor. You may adjust the placement of your hands to maximize your comfort. As you gain motion, your hands will come more underneath your shoulders.  Hold this position __________ seconds.  Slowly return to lying flat on the floor. Repeat __________ times. Complete this exercise __________ times per day.  RANGE OF MOTION- Quadruped, Neutral Spine   Assume a hands and knees position on a firm surface. Keep your hands under your shoulders and your knees under your hips. You may place padding under your knees for comfort.  Drop your head and point your tailbone toward the ground below you. This will round out your lower back like an angry cat. Hold this position for __________ seconds.  Slowly lift your head and release your tail bone so that your back sags into a large arch, like an old horse.  Hold this position for __________ seconds.  Repeat this until you feel limber in your low back.  Now, find your "sweet spot." This will be the most comfortable position somewhere between the two previous positions. This is your neutral spine. Once you have found this position, tense your stomach muscles to support your low back.  Hold this position for __________ seconds. Repeat __________ times. Complete this exercise __________ times per day.  STRENGTHENING EXERCISES - Low Back Sprain These exercises may help you when beginning to rehabilitate your injury. These exercises should be done  near your "sweet spot." This is the neutral, low-back arch, somewhere between fully rounded and fully arched, that is your least painful position. When performed in this safe range of motion, these exercises can be used for people who have either a flexion or extension based injury. These exercises may resolve your symptoms with or without further involvement from your physician, physical therapist or athletic trainer. While completing these exercises, remember:   Muscles can gain both the endurance and the strength needed for everyday activities through controlled exercises.  Complete these exercises as instructed by your physician, physical therapist or athletic trainer. Increase the resistance and repetitions only as guided.  You may experience muscle soreness or fatigue, but the pain or discomfort  you are trying to eliminate should never worsen during these exercises. If this pain does worsen, stop and make certain you are following the directions exactly. If the pain is still present after adjustments, discontinue the exercise until you can discuss the trouble with your caregiver. STRENGTHENING - Deep Abdominals, Pelvic Tilt   Lie on a firm bed or floor. Keeping your legs in front of you, bend your knees so they are both pointed toward the ceiling and your feet are flat on the floor.  Tense your lower abdominal muscles to press your low back into the floor. This motion will rotate your pelvis so that your tail bone is scooping upwards rather than pointing at your feet or into the floor. With a gentle tension and even breathing, hold this position for __________ seconds. Repeat __________ times. Complete this exercise __________ times per day.  STRENGTHENING - Abdominals, Crunches   Lie on a firm bed or floor. Keeping your legs in front of you, bend your knees so they are both pointed toward the ceiling and your feet are flat on the floor. Cross your arms over your chest.  Slightly tip your  chin down without bending your neck.  Tense your abdominals and slowly lift your trunk high enough to just clear your shoulder blades. Lifting higher can put excessive stress on the lower back and does not further strengthen your abdominal muscles.  Control your return to the starting position. Repeat __________ times. Complete this exercise __________ times per day.  STRENGTHENING - Quadruped, Opposite UE/LE Lift   Assume a hands and knees position on a firm surface. Keep your hands under your shoulders and your knees under your hips. You may place padding under your knees for comfort.  Find your neutral spine and gently tense your abdominal muscles so that you can maintain this position. Your shoulders and hips should form a rectangle that is parallel with the floor and is not twisted.  Keeping your trunk steady, lift your right hand no higher than your shoulder and then your left leg no higher than your hip. Make sure you are not holding your breath. Hold this position for __________ seconds.  Continuing to keep your abdominal muscles tense and your back steady, slowly return to your starting position. Repeat with the opposite arm and leg. Repeat __________ times. Complete this exercise __________ times per day.  STRENGTHENING - Abdominals and Quadriceps, Straight Leg Raise   Lie on a firm bed or floor with both legs extended in front of you.  Keeping one leg in contact with the floor, bend the other knee so that your foot can rest flat on the floor.  Find your neutral spine, and tense your abdominal muscles to maintain your spinal position throughout the exercise.  Slowly lift your straight leg off the floor about 6 inches for a count of 15, making sure to not hold your breath.  Still keeping your neutral spine, slowly lower your leg all the way to the floor. Repeat this exercise with each leg __________ times. Complete this exercise __________ times per day. POSTURE AND BODY  MECHANICS CONSIDERATIONS - Low Back Sprain Keeping correct posture when sitting, standing or completing your activities will reduce the stress put on different body tissues, allowing injured tissues a chance to heal and limiting painful experiences. The following are general guidelines for improved posture. Your physician or physical therapist will provide you with any instructions specific to your needs. While reading these guidelines, remember:  The exercises  prescribed by your provider will help you have the flexibility and strength to maintain correct postures.  The correct posture provides the best environment for your joints to work. All of your joints have less wear and tear when properly supported by a spine with good posture. This means you will experience a healthier, less painful body.  Correct posture must be practiced with all of your activities, especially prolonged sitting and standing. Correct posture is as important when doing repetitive low-stress activities (typing) as it is when doing a single heavy-load activity (lifting). RESTING POSITIONS Consider which positions are most painful for you when choosing a resting position. If you have pain with flexion-based activities (sitting, bending, stooping, squatting), choose a position that allows you to rest in a less flexed posture. You would want to avoid curling into a fetal position on your side. If your pain worsens with extension-based activities (prolonged standing, working overhead), avoid resting in an extended position such as sleeping on your stomach. Most people will find more comfort when they rest with their spine in a more neutral position, neither too rounded nor too arched. Lying on a non-sagging bed on your side with a pillow between your knees, or on your back with a pillow under your knees will often provide some relief. Keep in mind, being in any one position for a prolonged period of time, no matter how correct your  posture, can still lead to stiffness. PROPER SITTING POSTURE In order to minimize stress and discomfort on your spine, you must sit with correct posture. Sitting with good posture should be effortless for a healthy body. Returning to good posture is a gradual process. Many people can work toward this most comfortably by using various supports until they have the flexibility and strength to maintain this posture on their own. When sitting with proper posture, your ears will fall over your shoulders and your shoulders will fall over your hips. You should use the back of the chair to support your upper back. Your lower back will be in a neutral position, just slightly arched. You may place a small pillow or folded towel at the base of your lower back for  support.  When working at a desk, create an environment that supports good, upright posture. Without extra support, muscles tire, which leads to excessive strain on joints and other tissues. Keep these recommendations in mind: CHAIR:  A chair should be able to slide under your desk when your back makes contact with the back of the chair. This allows you to work closely.  The chair's height should allow your eyes to be level with the upper part of your monitor and your hands to be slightly lower than your elbows. BODY POSITION  Your feet should make contact with the floor. If this is not possible, use a foot rest.  Keep your ears over your shoulders. This will reduce stress on your neck and low back. INCORRECT SITTING POSTURES  If you are feeling tired and unable to assume a healthy sitting posture, do not slouch or slump. This puts excessive strain on your back tissues, causing more damage and pain. Healthier options include:  Using more support, like a lumbar pillow.  Switching tasks to something that requires you to be upright or walking.  Talking a brief walk.  Lying down to rest in a neutral-spine position. PROLONGED STANDING WHILE  SLIGHTLY LEANING FORWARD  When completing a task that requires you to lean forward while standing in one place for  a long time, place either foot up on a stationary 2-4 inch high object to help maintain the best posture. When both feet are on the ground, the lower back tends to lose its slight inward curve. If this curve flattens (or becomes too large), then the back and your other joints will experience too much stress, tire more quickly, and can cause pain. CORRECT STANDING POSTURES Proper standing posture should be assumed with all daily activities, even if they only take a few moments, like when brushing your teeth. As in sitting, your ears should fall over your shoulders and your shoulders should fall over your hips. You should keep a slight tension in your abdominal muscles to brace your spine. Your tailbone should point down to the ground, not behind your body, resulting in an over-extended swayback posture.  INCORRECT STANDING POSTURES  Common incorrect standing postures include a forward head, locked knees and/or an excessive swayback. WALKING Walk with an upright posture. Your ears, shoulders and hips should all line-up. PROLONGED ACTIVITY IN A FLEXED POSITION When completing a task that requires you to bend forward at your waist or lean over a low surface, try to find a way to stabilize 3 out of 4 of your limbs. You can place a hand or elbow on your thigh or rest a knee on the surface you are reaching across. This will provide you more stability, so that your muscles do not tire as quickly. By keeping your knees relaxed, or slightly bent, you will also reduce stress across your lower back. CORRECT LIFTING TECHNIQUES DO :  Assume a wide stance. This will provide you more stability and the opportunity to get as close as possible to the object which you are lifting.  Tense your abdominals to brace your spine. Bend at the knees and hips. Keeping your back locked in a neutral-spine position,  lift using your leg muscles. Lift with your legs, keeping your back straight.  Test the weight of unknown objects before attempting to lift them.  Try to keep your elbows locked down at your sides in order get the best strength from your shoulders when carrying an object.  Always ask for help when lifting heavy or awkward objects. INCORRECT LIFTING TECHNIQUES DO NOT:   Lock your knees when lifting, even if it is a small object.  Bend and twist. Pivot at your feet or move your feet when needing to change directions.  Assume that you can safely pick up even a paperclip without proper posture. Document Released: 05/16/2005 Document Revised: 08/08/2011 Document Reviewed: 08/28/2008 [facility] Patient Information 2015 [place], [place]. This information is not intended to replace advice given to you by your health care provider. Make sure you discuss any questions you have with your health care provider.

## 2015-01-31 DIAGNOSIS — L821 Other seborrheic keratosis: Secondary | ICD-10-CM | POA: Insufficient documentation

## 2015-01-31 DIAGNOSIS — M5442 Lumbago with sciatica, left side: Secondary | ICD-10-CM | POA: Insufficient documentation

## 2015-01-31 DIAGNOSIS — E039 Hypothyroidism, unspecified: Secondary | ICD-10-CM | POA: Insufficient documentation

## 2015-01-31 NOTE — Progress Notes (Signed)
   Subjective:    Patient ID: Autumn Bowen, female    DOB: 1946-02-22, 69 y.o.   MRN: [id number]  HPI Pt presents to the clinic with multiple concerns today.   DM- not checking sugars regulary. Had problems with getting trulcity and just started 2-3 weeks ago. Tolerating well. On janumet. No hypoglycemia. Neuropathy controlled with lyrica. No vision changes. Trying to watch diet and stay gluten free for celiac disease.   Hypothyroidism- needs refill. No concerns today and taking medication.   Tobacco abuse- smoking daily. Wants to stop.   COPD- no concerns controlled on Advair.   GERD- buying pepcid OTC needs rx.   Neuropathy- controlled with lyrica.    Review of Systems  All other systems reviewed and are negative.      Objective:   Physical Exam  Constitutional: She is oriented to person, place, and time. She appears well-developed and well-nourished.  HENT:  Head: Normocephalic and atraumatic.  Neck: Normal range of motion. Neck supple. No thyromegaly present.  Cardiovascular: Normal rate, regular rhythm and normal heart sounds.   Pulmonary/Chest: Effort normal and breath sounds normal. She has no wheezes.  Musculoskeletal:  No tenderness to palpation over lumbar spine.  Tenderness over left paraspionus and buttocks.  No tendneress of greater trochanter.  Limited ROM at waist due to weight/age/pain.   Lymphadenopathy:    She has no cervical adenopathy.  Neurological: She is alert and oriented to person, place, and time.  Skin: Skin is dry.  Multiple brown waxy papules with stuck on appearance over arms, chest, back and trunk.   Psychiatric: She has a normal mood and affect. Her behavior is normal.          Assessment & Plan:  DM- .Marland Kitchen Lab Results  Component Value Date   HGBA1C 7.9 01/26/2015   Has improved over 3 months.  Trulicity was just started 2 weeks ago. Will give some more time.  Continue Janumet.  Discussed eye exam.  Declined flu shot today.   Follow up in 3 months.   Hypothyroidism- TSH ordered. Will adjust accordingly.   Neuropathy- refilled lyrica.   GERD- sent pepcid to pharmacy. Discussed GERD diet.   Tobacco abuse- ok'd use of OTC nicoderm patches to stop smoking.   COPD- controlled with advair. Refilled today.   SK's- gave HO. Discussed benign.   Left sided back pain- come back for evaluation. Could refer to Dr. Darene Lamer or Dr. Georgina Snell.

## 2015-02-23 ENCOUNTER — Encounter: Payer: Self-pay | Admitting: Sports Medicine

## 2015-02-23 ENCOUNTER — Other Ambulatory Visit (HOSPITAL_COMMUNITY): Payer: Self-pay | Admitting: Psychiatry

## 2015-02-23 ENCOUNTER — Ambulatory Visit (INDEPENDENT_AMBULATORY_CARE_PROVIDER_SITE_OTHER): Payer: Commercial Managed Care - HMO | Admitting: Sports Medicine

## 2015-02-23 VITALS — BP 137/64 | HR 82 | Wt 212.0 lb

## 2015-02-23 DIAGNOSIS — L600 Ingrowing nail: Secondary | ICD-10-CM | POA: Diagnosis not present

## 2015-02-23 DIAGNOSIS — M19079 Primary osteoarthritis, unspecified ankle and foot: Secondary | ICD-10-CM

## 2015-02-23 DIAGNOSIS — M5442 Lumbago with sciatica, left side: Secondary | ICD-10-CM | POA: Diagnosis not present

## 2015-02-23 DIAGNOSIS — M1990 Unspecified osteoarthritis, unspecified site: Secondary | ICD-10-CM

## 2015-02-23 MED ORDER — AMITRIPTYLINE HCL 50 MG PO TABS: ORAL_TABLET | ORAL | Status: DC

## 2015-02-23 NOTE — Assessment & Plan Note (Signed)
Still with some low back pain and left-sided radiculopathy. She is post-L4-L5 laminectomy and decompression. She has had a nerve conduction study which I would like her to get hold of, it looks like her newest MRI is approximately 70 years old. I would like a new MRI of the lumbar spine with IV contrast.

## 2015-02-23 NOTE — Progress Notes (Signed)
  Subjective:    CC: Follow-up  HPI: Bilateral first metatarsophalangeal joint osteoarthritis: Pain continues to be resolved after bilateral injection 2 months ago, and custom orthotics with first metatarsal ray posting.  Toenail removal: Left-sided, now completely healed, still with a bit of tenderness at the proximal nailfold. No evidence of infection.  Lumbar spondylosis: Post L4-L5 laminectomy, with persistent axial pain worse with standing, and no true radicular component.  Past medical history, Surgical history, Family history not pertinant except as noted below, Social history, Allergies, and medications have been entered into the medical record, reviewed, and no changes needed.   Review of Systems: No fevers, chills, night sweats, weight loss, chest pain, or shortness of breath.   Objective:    General: Well Developed, well nourished, and in no acute distress.  Neuro: Alert and oriented x3, extra-ocular muscles intact, sensation grossly intact.  HEENT: Normocephalic, atraumatic, pupils equal round reactive to light, neck supple, no masses, no lymphadenopathy, thyroid nonpalpable.  Skin: Warm and dry, no rashes. Cardiac: Regular rate and rhythm, no murmurs rubs or gallops, no lower extremity edema.  Respiratory: Clear to auscultation bilaterally. Not using accessory muscles, speaking in full sentences. Feet: No visible erythema or swelling. Toenail removal is well-healed, and no evidence of new nail present as hoped. Range of motion is full in all directions. Strength is 5/5 in all directions. No hallux valgus. No pes cavus or pes planus. No abnormal callus noted. No pain over the navicular prominence, or base of fifth metatarsal. No tenderness to palpation of the calcaneal insertion of plantar fascia. No pain at the Achilles insertion. No pain over the calcaneal bursa. No pain of the retrocalcaneal bursa. No tenderness to palpation over the tarsals, metatarsals, or  phalanges. No hallux rigidus or limitus. No tenderness palpation over interphalangeal joints. No pain with compression of the metatarsal heads. Neurovascularly intact distally.  Impression and Recommendations:

## 2015-02-23 NOTE — Assessment & Plan Note (Signed)
Continues to do well 2 months post bilateral injection and custom orthotics with first ray posting

## 2015-02-23 NOTE — Assessment & Plan Note (Signed)
Still a bit tender at the proximal nail full. Adding a bit of amitriptyline.

## 2015-02-25 ENCOUNTER — Other Ambulatory Visit (HOSPITAL_COMMUNITY): Payer: Self-pay | Admitting: Psychiatry

## 2015-02-27 NOTE — Telephone Encounter (Signed)
Received fax from [facility] for a medication refill for Zoloft 112m, # 60. Per Dr. ADe Nurse medication request is denied. Prescription was sent to pharmacy for Zoloft 1033m #60 w/ 1 additional refill. Pt has a f/u appt on 03/10/15.

## 2015-02-27 NOTE — Telephone Encounter (Signed)
Received fax from [facility] for a medication refill for Seroquel 21m. Per Dr. ADe Nurse pt medication is denied. Prescription was faxed to pharmacy on 01/08/15 with 1 additional refill. Pt has a f/u appt on 03/10/15.

## 2015-03-07 ENCOUNTER — Other Ambulatory Visit: Payer: Self-pay | Admitting: Physician Assistant

## 2015-03-10 ENCOUNTER — Ambulatory Visit (INDEPENDENT_AMBULATORY_CARE_PROVIDER_SITE_OTHER): Payer: Medicare HMO | Admitting: Psychiatry

## 2015-03-10 ENCOUNTER — Encounter (HOSPITAL_COMMUNITY): Payer: Self-pay | Admitting: Psychiatry

## 2015-03-10 VITALS — BP 132/78 | HR 86 | Ht 63.0 in | Wt 210.0 lb

## 2015-03-10 DIAGNOSIS — F4323 Adjustment disorder with mixed anxiety and depressed mood: Secondary | ICD-10-CM

## 2015-03-10 DIAGNOSIS — F4310 Post-traumatic stress disorder, unspecified: Secondary | ICD-10-CM

## 2015-03-10 DIAGNOSIS — F331 Major depressive disorder, recurrent, moderate: Secondary | ICD-10-CM

## 2015-03-10 MED ORDER — QUETIAPINE FUMARATE 200 MG PO TABS: 200.0000 mg | ORAL_TABLET | Freq: Every day | ORAL | Status: DC

## 2015-03-10 MED ORDER — SERTRALINE HCL 100 MG PO TABS: ORAL_TABLET | ORAL | Status: DC
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
## 2015-04-20 ENCOUNTER — Ambulatory Visit: Payer: Self-pay | Admitting: Physician Assistant

## 2015-04-27 ENCOUNTER — Other Ambulatory Visit: Payer: Self-pay | Admitting: Physician Assistant

## 2015-04-28 ENCOUNTER — Encounter: Payer: Self-pay | Admitting: Physician Assistant

## 2015-04-28 ENCOUNTER — Ambulatory Visit (INDEPENDENT_AMBULATORY_CARE_PROVIDER_SITE_OTHER): Payer: Commercial Managed Care - HMO | Admitting: Physician Assistant

## 2015-04-28 VITALS — BP 98/54 | HR 61 | Ht 63.0 in | Wt 213.0 lb

## 2015-04-28 DIAGNOSIS — J449 Chronic obstructive pulmonary disease, unspecified: Secondary | ICD-10-CM

## 2015-04-28 DIAGNOSIS — I10 Essential (primary) hypertension: Secondary | ICD-10-CM | POA: Diagnosis not present

## 2015-04-28 DIAGNOSIS — E118 Type 2 diabetes mellitus with unspecified complications: Secondary | ICD-10-CM | POA: Diagnosis not present

## 2015-04-28 DIAGNOSIS — G4733 Obstructive sleep apnea (adult) (pediatric): Secondary | ICD-10-CM | POA: Diagnosis not present

## 2015-04-28 DIAGNOSIS — E038 Other specified hypothyroidism: Secondary | ICD-10-CM

## 2015-04-28 DIAGNOSIS — R894 Abnormal immunological findings in specimens from other organs, systems and tissues: Secondary | ICD-10-CM

## 2015-04-28 DIAGNOSIS — R768 Other specified abnormal immunological findings in serum: Secondary | ICD-10-CM

## 2015-04-28 LAB — POCT GLYCOSYLATED HEMOGLOBIN (HGB A1C): Hemoglobin A1C: 7.8

## 2015-04-28 NOTE — Progress Notes (Signed)
Subjective:    Patient ID: Autumn Bowen, female    DOB: Jun 08, 1945, 69 y.o.   MRN: [id number]  HPI  Pt presents to the clinic for medication follow up.   DM- per pt doing well. No concerns or complaints. Reports using janumet and trulicity. Not checking sugars daily but running 120" in am. She has been to eye doctor and treating her macular degeneration but denies any diabetic retinopathy. No wounds or open sores. No hypoglycemic events.   HTN- she is a little stressed she left front door open after leaving for office visit. No CP, palpitations, headaches or vision changes.   COPD- pt reports no symptoms. Not usuing and rescue or daily inhalers. Continues to smoke half pack daily. -+  OSA- on CPAP. Does not use every night because does not like mask.   Hypothyroidism- needs refill.   .. Active Ambulatory Problems    Diagnosis Date Noted  . Hypothyroidism 05/30/1988  . VITAMIN D DEFICIENCY 11/06/2008  . DYSLIPIDEMIA 10/16/2008  . ANEMIA 10/23/2008  . Major depressive disorder ([place]) 11/05/2008  . TOBACCO ABUSE 10/15/2008  . POST TRAUMATIC STRESS SYNDROME 05/30/2000  . VISION DISORDER 12/03/2008  . ALLERGIC RHINITIS 10/15/2008  . EMPHYSEMA 05/11/2009  . COPD (chronic obstructive pulmonary disease) with chronic bronchitis ([place]) 05/10/2009  . OSTEOPENIA 11/18/2008  . FATIGUE 11/05/2008  . DIARRHEA 07/01/2007  . FRACTURE, ANKLE, RIGHT 04/29/2008  . POSTMENOPAUSAL STATUS 05/30/1968  . Type 2 diabetes mellitus with complication ([place]) 19/50/9326  . Migraine without aura and without status migrainosus, not intractable 07/28/2014  . Macular degeneration, dry 07/29/2014  . Adenomatous polyp of colon 07/30/2014  . Toenail fungus 10/03/2014  . Ingrown left big toenail 10/04/2014  . Seasonal allergies 10/04/2014  . Neuropathy ([place]) 10/04/2014  . Osteoarthritis of first metatarsophalangeal joint 11/17/2014  . Cervical spondylosis 12/04/2014  . GERD (gastroesophageal reflux disease)  01/26/2015  . Celiac disease 01/26/2015  . Tobacco abuse 01/26/2015  . Seborrheic keratoses 01/31/2015  . Thyroid activity decreased 01/31/2015  . Left-sided low back pain with left-sided sciatica 01/31/2015  . OSA (obstructive sleep apnea) 04/28/2015   Resolved Ambulatory Problems    Diagnosis Date Noted  . No Resolved Ambulatory Problems   Past Medical History  Diagnosis Date  . Diabetes mellitus type II   . Dyslipidemia   . Anemia   . Sore throat   . Diverticulitis      Review of Systems  All other systems reviewed and are negative.      Objective:   Physical Exam  Constitutional: She is oriented to person, place, and time. She appears well-developed and well-nourished.  HENT:  Head: Normocephalic and atraumatic.  Cardiovascular: Normal rate, regular rhythm and normal heart sounds.   Pulmonary/Chest: Effort normal and breath sounds normal.  Neurological: She is alert and oriented to person, place, and time.  Psychiatric: She has a normal mood and affect. Her behavior is normal.          Assessment & Plan:  DM type II- .Marland Kitchen Lab Results  Component Value Date   HGBA1C 7.8 04/28/2015   Down .1 point. I am a little concerned she is not using trulicity pen correctly. Pt is going to bring in Friday and show me how she gives injection. Her reported sugars look great.   Discussed with pt goal is 7.8. With addition of GLP-1 only reduction of .1 point is not standard.  Continue janumet. Discussed diabetic diet.  Pt aware she needs flu and pneumonia.  Will discuss again at CPE.  Foot exam done and pt reported spots where she could not "feel" but nurse saw pt's feet reflexing.   HTN- BP was orginally elevated then appeared to by hypotensive. Pt is returning to clinic Friday for medicare wellness will check then and make decision on changing medication. Pt is asymptomatic and appears stable.   COPD- not taking advair daily and reports no daily symptoms of cough, SOB.  Discussed spironmetry in the future to access lung function. Encouraged smoking cessation. Pt not interested today.   OSA- using CPAP.   Hypothyroidism- will check TSH and change labs accordingly.

## 2015-04-28 NOTE — Patient Instructions (Signed)
Bring in shot to watch patient give herself and that she is getting medication.

## 2015-05-01 ENCOUNTER — Ambulatory Visit (INDEPENDENT_AMBULATORY_CARE_PROVIDER_SITE_OTHER): Payer: Commercial Managed Care - HMO | Admitting: Physician Assistant

## 2015-05-01 ENCOUNTER — Other Ambulatory Visit: Payer: Self-pay | Admitting: Physician Assistant

## 2015-05-01 ENCOUNTER — Other Ambulatory Visit (HOSPITAL_COMMUNITY): Payer: Self-pay | Admitting: Psychiatry

## 2015-05-01 VITALS — BP 105/58 | HR 80 | Ht 63.0 in | Wt 214.0 lb

## 2015-05-01 DIAGNOSIS — F17200 Nicotine dependence, unspecified, uncomplicated: Secondary | ICD-10-CM

## 2015-05-01 DIAGNOSIS — Z0000 Encounter for general adult medical examination without abnormal findings: Secondary | ICD-10-CM

## 2015-05-01 DIAGNOSIS — Z720 Tobacco use: Secondary | ICD-10-CM | POA: Diagnosis not present

## 2015-05-01 DIAGNOSIS — R29818 Other symptoms and signs involving the nervous system: Secondary | ICD-10-CM | POA: Diagnosis not present

## 2015-05-01 DIAGNOSIS — L600 Ingrowing nail: Secondary | ICD-10-CM | POA: Diagnosis not present

## 2015-05-01 DIAGNOSIS — Z23 Encounter for immunization: Secondary | ICD-10-CM | POA: Diagnosis not present

## 2015-05-01 DIAGNOSIS — E038 Other specified hypothyroidism: Secondary | ICD-10-CM | POA: Diagnosis not present

## 2015-05-01 DIAGNOSIS — R894 Abnormal immunological findings in specimens from other organs, systems and tissues: Secondary | ICD-10-CM | POA: Diagnosis not present

## 2015-05-01 DIAGNOSIS — R2689 Other abnormalities of gait and mobility: Secondary | ICD-10-CM

## 2015-05-01 DIAGNOSIS — Z1239 Encounter for other screening for malignant neoplasm of breast: Secondary | ICD-10-CM

## 2015-05-01 DIAGNOSIS — R413 Other amnesia: Secondary | ICD-10-CM

## 2015-05-01 DIAGNOSIS — K1370 Unspecified lesions of oral mucosa: Secondary | ICD-10-CM

## 2015-05-01 DIAGNOSIS — H9193 Unspecified hearing loss, bilateral: Secondary | ICD-10-CM

## 2015-05-01 DIAGNOSIS — Z79899 Other long term (current) drug therapy: Secondary | ICD-10-CM

## 2015-05-01 MED ORDER — HYDROCODONE-ACETAMINOPHEN 5-325 MG PO TABS: 1.0000 | ORAL_TABLET | Freq: Three times a day (TID) | ORAL | Status: DC | PRN

## 2015-05-02 LAB — BASIC METABOLIC PANEL
BUN: 15 mg/dL (ref 7–25)
CO2: 29 mmol/L (ref 20–31)
Calcium: 8.9 mg/dL (ref 8.6–10.4)
Chloride: 102 mmol/L (ref 98–110)
Creat: 0.61 mg/dL (ref 0.60–0.93)
Glucose, Bld: 141 mg/dL — ABNORMAL HIGH (ref 65–99)
Potassium: 4.8 mmol/L (ref 3.5–5.3)
Sodium: 137 mmol/L (ref 135–146)

## 2015-05-02 LAB — TSH: TSH: 0.505 u[IU]/mL (ref 0.350–4.500)

## 2015-05-02 LAB — HEPATITIS C ANTIBODY: HCV Ab: NEGATIVE

## 2015-05-04 DIAGNOSIS — K1370 Unspecified lesions of oral mucosa: Secondary | ICD-10-CM | POA: Insufficient documentation

## 2015-05-04 NOTE — Telephone Encounter (Signed)
Received medication request for Zoloft 149m and Seroquel 2064mPer Dr. AkDe Nursemedication requests are denied. Pt has request medication too early. Pt has a f/u appt on 05/07/15.

## 2015-05-05 ENCOUNTER — Other Ambulatory Visit: Payer: Self-pay | Admitting: Physician Assistant

## 2015-05-05 ENCOUNTER — Encounter: Payer: Self-pay | Admitting: Physician Assistant

## 2015-05-05 DIAGNOSIS — R413 Other amnesia: Secondary | ICD-10-CM | POA: Insufficient documentation

## 2015-05-05 DIAGNOSIS — H9193 Unspecified hearing loss, bilateral: Secondary | ICD-10-CM | POA: Insufficient documentation

## 2015-05-05 DIAGNOSIS — R2689 Other abnormalities of gait and mobility: Secondary | ICD-10-CM | POA: Insufficient documentation

## 2015-05-05 MED ORDER — LEVOTHYROXINE SODIUM 175 MCG PO TABS: 175.0000 ug | ORAL_TABLET | Freq: Every day | ORAL | Status: DC

## 2015-05-05 NOTE — Progress Notes (Signed)
Subjective:    Autumn Bowen is a 69 y.o. female who presents for Medicare Annual/Subsequent preventive examination.  Preventive Screening-Counseling & Management  Tobacco History  Smoking status  . Current Every Day Smoker -- 0.30 packs/day for 50 years  . Types: Cigarettes  . Start date: 01/02/2012  Smokeless tobacco  . Not on file     Problems Prior to Visit  Current Problems (verified) Patient Active Problem List   Diagnosis Date Noted  . Mouth lesion 05/04/2015  . OSA (obstructive sleep apnea) 04/28/2015  . Seborrheic keratoses 01/31/2015  . Thyroid activity decreased 01/31/2015  . Left-sided low back pain with left-sided sciatica 01/31/2015  . GERD (gastroesophageal reflux disease) 01/26/2015  . Celiac disease 01/26/2015  . Tobacco abuse 01/26/2015  . Cervical spondylosis 12/04/2014  . Osteoarthritis of first metatarsophalangeal joint 11/17/2014  . Ingrown left big toenail 10/04/2014  . Seasonal allergies 10/04/2014  . Neuropathy ([place]) 10/04/2014  . Toenail fungus 10/03/2014  . Adenomatous polyp of colon 07/30/2014  . Macular degeneration, dry 07/29/2014  . Type 2 diabetes mellitus with complication ([place]) 29/56/2130  . Migraine without aura and without status migrainosus, not intractable 07/28/2014  . EMPHYSEMA 05/11/2009  . COPD (chronic obstructive pulmonary disease) with chronic bronchitis ([place]) 05/10/2009  . VISION DISORDER 12/03/2008  . OSTEOPENIA 11/18/2008  . VITAMIN D DEFICIENCY 11/06/2008  . Major depressive disorder ([place]) 11/05/2008  . FATIGUE 11/05/2008  . ANEMIA 10/23/2008  . DYSLIPIDEMIA 10/16/2008  . TOBACCO ABUSE 10/15/2008  . ALLERGIC RHINITIS 10/15/2008  . FRACTURE, ANKLE, RIGHT 04/29/2008  . DIARRHEA 07/01/2007  . POST TRAUMATIC STRESS SYNDROME 05/30/2000  . Hypothyroidism 05/30/1988  . POSTMENOPAUSAL STATUS 05/30/1968    Medications Prior to Visit Current Outpatient Prescriptions on File Prior to Visit  Medication Sig Dispense  Refill  . albuterol (PROVENTIL HFA;VENTOLIN HFA) 108 (90 BASE) MCG/ACT inhaler Inhale 2 puffs into the lungs every 6 (six) hours as needed.    . AMBULATORY NON FORMULARY MEDICATION Glucometer, test strips and lancets for testing once a day for DM, uncontrolled. 100 strip 0  . amitriptyline (ELAVIL) 50 MG tablet One half tab PO qHS for a week, then one tab PO qHS. 90 tablet 3  . cyclobenzaprine (FLEXERIL) 5 MG tablet Take 5 mg by mouth 2 (two) times daily as needed for muscle spasms.    . Dulaglutide (TRULICITY) 8.65 HQ/4.6NG SOPN Inject 0.75 mg into the skin once a week. 4 pen 11  . EPIPEN 2-PAK 0.3 MG/0.3ML DEVI     . ezetimibe-simvastatin (VYTORIN) 10-40 MG per tablet Take 1 tablet by mouth daily. 90 tablet 4  . famotidine (PEPCID) 20 MG tablet Take 1 tablet (20 mg total) by mouth daily. 90 tablet 3  . Fluticasone-Salmeterol (ADVAIR) 250-50 MCG/DOSE AEPB Inhale 2 puffs into the lungs every 12 (twelve) hours.    Marland Kitchen GLUCOSAMINE-CHONDROITIN PO Take by mouth.    Marland Kitchen glucose blood (ONE TOUCH ULTRA TEST) test strip Testing once or twice a day for DM, uncontrolled. 100 each 5  . levocetirizine (XYZAL) 5 MG tablet Take 5 mg by mouth every evening.    Marland Kitchen levothyroxine (SYNTHROID, LEVOTHROID) 175 MCG tablet Take 1 tablet (175 mcg total) by mouth daily. PATIENT NEEDS APPOINTMENT FOR FURTHER REFILLS 30 tablet 0  . NAPROXEN DR 500 MG EC tablet Take 500 mg by mouth as needed.    . ondansetron (ZOFRAN) 8 MG tablet Take 1 tablet (8 mg total) by mouth every 8 (eight) hours as needed for nausea or vomiting. [place]  tablet 1  . pregabalin (LYRICA) 50 MG capsule Take 1 capsule (50 mg total) by mouth 3 (three) times daily. 90 capsule 2  . propranolol ER (INDERAL LA) 160 MG SR capsule Take 1 capsule (160 mg total) by mouth daily. 30 capsule 5  . QUEtiapine (SEROQUEL) 200 MG tablet Take 1 tablet (200 mg total) by mouth at bedtime. 30 tablet 1  . sertraline (ZOLOFT) 100 MG tablet Take 2 tablets (200 mg total) by mouth daily. 60  tablet 1  . SitaGLIPtin-MetFORMIN HCl (JANUMET XR) [id number] MG TB24 Take 1 tablet by mouth daily. 30 tablet 5   No current facility-administered medications on file prior to visit.    Current Medications (verified) Current Outpatient Prescriptions  Medication Sig Dispense Refill  . albuterol (PROVENTIL HFA;VENTOLIN HFA) 108 (90 BASE) MCG/ACT inhaler Inhale 2 puffs into the lungs every 6 (six) hours as needed.    . AMBULATORY NON FORMULARY MEDICATION Glucometer, test strips and lancets for testing once a day for DM, uncontrolled. 100 strip 0  . amitriptyline (ELAVIL) 50 MG tablet One half tab PO qHS for a week, then one tab PO qHS. 90 tablet 3  . cyclobenzaprine (FLEXERIL) 5 MG tablet Take 5 mg by mouth 2 (two) times daily as needed for muscle spasms.    . Dulaglutide (TRULICITY) 6.28 ZM/6.2HU SOPN Inject 0.75 mg into the skin once a week. 4 pen 11  . EPIPEN 2-PAK 0.3 MG/0.3ML DEVI     . ezetimibe-simvastatin (VYTORIN) 10-40 MG per tablet Take 1 tablet by mouth daily. 90 tablet 4  . famotidine (PEPCID) 20 MG tablet Take 1 tablet (20 mg total) by mouth daily. 90 tablet 3  . Fluticasone-Salmeterol (ADVAIR) 250-50 MCG/DOSE AEPB Inhale 2 puffs into the lungs every 12 (twelve) hours.    Marland Kitchen GLUCOSAMINE-CHONDROITIN PO Take by mouth.    Marland Kitchen glucose blood (ONE TOUCH ULTRA TEST) test strip Testing once or twice a day for DM, uncontrolled. 100 each 5  . HYDROcodone-acetaminophen (NORCO/VICODIN) 5-325 MG tablet Take 1 tablet by mouth every 8 (eight) hours as needed for moderate pain. 30 tablet 0  . levocetirizine (XYZAL) 5 MG tablet Take 5 mg by mouth every evening.    Marland Kitchen levothyroxine (SYNTHROID, LEVOTHROID) 175 MCG tablet Take 1 tablet (175 mcg total) by mouth daily. PATIENT NEEDS APPOINTMENT FOR FURTHER REFILLS 30 tablet 0  . NAPROXEN DR 500 MG EC tablet Take 500 mg by mouth as needed.    . ondansetron (ZOFRAN) 8 MG tablet Take 1 tablet (8 mg total) by mouth every 8 (eight) hours as needed for nausea or  vomiting. 20 tablet 1  . pregabalin (LYRICA) 50 MG capsule Take 1 capsule (50 mg total) by mouth 3 (three) times daily. 90 capsule 2  . propranolol ER (INDERAL LA) 160 MG SR capsule Take 1 capsule (160 mg total) by mouth daily. 30 capsule 5  . QUEtiapine (SEROQUEL) 200 MG tablet Take 1 tablet (200 mg total) by mouth at bedtime. 30 tablet 1  . sertraline (ZOLOFT) 100 MG tablet Take 2 tablets (200 mg total) by mouth daily. 60 tablet 1  . SitaGLIPtin-MetFORMIN HCl (JANUMET XR) [id number] MG TB24 Take 1 tablet by mouth daily. 30 tablet 5   No current facility-administered medications for this visit.     Allergies (verified) Atorvastatin; Esomeprazole magnesium; Gluten meal; Ivp dye; Latex; Penicillins; Trazodone and nefazodone; and Zolpidem tartrate   PAST HISTORY  Family History Family History  Problem Relation Age of Onset  . Anxiety disorder Mother   .  Depression Mother   . Heart attack Mother   . Stroke Mother   . Diabetes type II Mother     Social History Social History  Substance Use Topics  . Smoking status: Current Every Day Smoker -- 0.30 packs/day for 50 years    Types: Cigarettes    Start date: 01/02/2012  . Smokeless tobacco: Not on file  . Alcohol Use: No     Are there smokers in your home (other than you)? No  Risk Factors Current exercise habits: The patient does not participate in regular exercise at present.  Dietary issues discussed: none  Cardiac risk factors: advanced age (older than 54 for men, 34 for women), diabetes mellitus, dyslipidemia, obesity (BMI >= 30 kg/m2), sedentary lifestyle and smoking/ tobacco exposure.  Depression Screen (Note: if answer to either of the following is "Yes", a more complete depression screening is indicated)   Over the past two weeks, have you felt down, depressed or hopeless? Yes  Over the past two weeks, have you felt little interest or pleasure in doing things? Yes  Have you lost interest or pleasure in daily life?  Yes  Do you often feel hopeless? No  Do you cry easily over simple problems? No  Activities of Daily Living In your present state of health, do you have any difficulty performing the following activities?:  Driving? No Managing money?  {no Feeding yourself? No Getting from bed to chair? No Climbing a flight of stairs? Yes Preparing food and eating?: no Bathing or showering? no Getting dressed: no Getting to the toilet?no Using the toilet:no Moving around from place to place: sometimes In the past year have you fallen or had a near fall?:yes   Are you sexually active?  No  Do you have more than one partner?  no  Hearing Difficulties: Yes Do you often ask people to speak up or repeat themselves? Yes Do you experience ringing or noises in your ears? NO Do you have difficulty understanding soft or whispered voices? yes   Do you feel that you have a problem with memory? No  Do you often misplace items? No  Do you feel safe at home?  Yes  Cognitive Testing  Alert? Yes  Normal Appearance?Yes  Oriented to person? Yes  Place? Yes   Time? Yes  Recall of three objects?  No  Can perform simple calculations? Yes  Displays appropriate judgment?Yes  Can read the correct time from a watch face?Yes   Advanced Directives have been discussed with the patient? Yes  List the Names of Other Physician/Practitioners you currently use: 1. Dr. Pricilla Handler 2. Dr. Dianah Field   Indicate any recent Medical Services you may have received from other than [facility] providers in the past year (date may be approximate).  Immunization History  Administered Date(s) Administered  . Influenza Whole 05/27/2009  . Influenza,inj,Quad PF,36+ Mos 05/01/2015  . Pneumococcal Polysaccharide-23 05/01/2015  . Td 08/05/2004, 06/26/2007    Screening Tests Health Maintenance  Topic Date Due  . FOOT EXAM  05/01/1955  . MAMMOGRAM  11/12/2010  . OPHTHALMOLOGY EXAM  12/27/2014  . URINE MICROALBUMIN  10/03/2015  .  HEMOGLOBIN A1C  10/26/2015  . INFLUENZA VACCINE  12/29/2015  . TETANUS/TDAP  06/25/2017  . COLONOSCOPY  02/25/2018  . DEXA SCAN  Completed  . ZOSTAVAX  Addressed  . Hepatitis C Screening  Completed  . PNA vac Low Risk Adult  Completed    All answers were reviewed with the patient and necessary referrals were  made:  [name], PA-C   05/05/2015   History reviewed: allergies, current medications, past family history, past medical history, past social history, past surgical history and problem list  Review of Systems Pertinent items noted in HPI and remainder of comprehensive ROS otherwise negative.    Objective:     Vision by Snellen chart: right BBC:[id number] recent eye exam, left QXI:[facility] eye care [facility] recent eye exam  There is no weight on file to calculate BMI. LMP 09/21/1974  LMP 09/21/1974 General appearance: alert, cooperative and appears stated age Head: Normocephalic, without obvious abnormality, atraumatic Eyes: conjunctivae/corneas clear. PERRL, EOM's intact. Fundi benign. Ears: normal TM's and external ear canals both ears Nose: Nares normal. Septum midline. Mucosa normal. No drainage or sinus tenderness. Throat: lips, mucosa, and tongue normal; teeth and gums normal and there are 3 cyst/nodules like structures in the buccal mucosa of lower lip. Neck: no adenopathy, no carotid bruit, no JVD, supple, symmetrical, trachea midline and thyroid not enlarged, symmetric, no tenderness/mass/nodules Back: symmetric, no curvature. ROM normal. No CVA tenderness. Lungs: clear to auscultation bilaterally Heart: regular rate and rhythm, S1, S2 normal, no murmur, click, rub or gallop Abdomen: soft, non-tender; bowel sounds normal; no masses,  no organomegaly Extremities: extremities normal, atraumatic, no cyanosis or edema Pulses: 2+ and symmetric Skin: Skin color, texture, turgor normal. No rashes or lesions Lymph nodes: Cervical, supraclavicular, and  axillary nodes normal. Neurologic: Grossly normal     Assessment:     Medicare wellness exam     Plan:     During the course of the visit the patient was educated and counseled about appropriate screening and preventive services including:    Pneumococcal vaccine   Influenza vaccine  Screening mammography  Nutrition counseling   Smoking cessation counseling  discuss MRI that Dr. Darene Lamer ordered. CMP order so that she could proceed with order.  6CIT was 12/28. She missed one whole segment because she could not repeat address phrase. Discussed with pt how she could have some mild dementia. Would like for patient to come back to have mini mental status testing done.   PHQ-9 was16. She is being treated for depression. Encouraged her to follow up with behavioral health downstairs.   Hearing impairment- pt has been refer many times to audiology. Cost is an issue with hearing aids.  [facility] is managing her macular degeneration and eye changes.   Mouth lesions/current smoker- will refer to specialist for biopsy.   Balance issues- she does report to some falls and almost falls. Will get home health out to the house to evaluate and eliminate any fall risk.     Patient Instructions (the written plan) was given to the patient.  Medicare Attestation I have personally reviewed: The patient's medical and social history Their use of alcohol, tobacco or illicit drugs Their current medications and supplements The patient's functional ability including ADLs,fall risks, home safety risks, cognitive, and hearing and visual impairment Diet and physical activities Evidence for depression or mood disorders  The patient's weight, height, BMI, and visual acuity have been recorded in the chart.  I have made referrals, counseling, and provided education to the patient based on review of the above and I have provided the patient with a written personalized care plan for preventive services.      [name], PA-C   05/01/2015

## 2015-05-05 NOTE — Patient Instructions (Signed)
Come back for mini-mental status.  Will refer to home health and ENT.

## 2015-05-05 NOTE — Telephone Encounter (Signed)
Call [facility] and get last eye exam.

## 2015-05-06 ENCOUNTER — Telehealth: Payer: Self-pay

## 2015-05-06 NOTE — Telephone Encounter (Signed)
Patient wants to be pre medicated for the MRI. She is very claustrophobic. Please advise.

## 2015-05-07 ENCOUNTER — Ambulatory Visit (HOSPITAL_COMMUNITY): Payer: Commercial Managed Care - HMO | Admitting: Psychiatry

## 2015-05-08 NOTE — Telephone Encounter (Signed)
Will you have a driver? If so can send valium 21m once 337mutes before MRI. #1 NRF

## 2015-05-08 NOTE — Telephone Encounter (Signed)
Kathleen Lime says patient was last seen 11/2011.

## 2015-05-08 NOTE — Telephone Encounter (Signed)
Called in. Patient advised.

## 2015-05-11 ENCOUNTER — Telehealth: Payer: Self-pay

## 2015-05-11 NOTE — Telephone Encounter (Signed)
Left message for patient to follow up on eye exam or update [place] on last visit.

## 2015-05-11 NOTE — Telephone Encounter (Signed)
Call pt: remind her of importance of eye exam and the date we last have eye exam.

## 2015-05-12 DIAGNOSIS — R262 Difficulty in walking, not elsewhere classified: Secondary | ICD-10-CM | POA: Diagnosis not present

## 2015-05-12 DIAGNOSIS — Z720 Tobacco use: Secondary | ICD-10-CM | POA: Diagnosis not present

## 2015-05-12 DIAGNOSIS — G4733 Obstructive sleep apnea (adult) (pediatric): Secondary | ICD-10-CM | POA: Diagnosis not present

## 2015-05-12 DIAGNOSIS — J449 Chronic obstructive pulmonary disease, unspecified: Secondary | ICD-10-CM | POA: Diagnosis not present

## 2015-05-12 DIAGNOSIS — M6281 Muscle weakness (generalized): Secondary | ICD-10-CM | POA: Diagnosis not present

## 2015-05-12 DIAGNOSIS — R29818 Other symptoms and signs involving the nervous system: Secondary | ICD-10-CM | POA: Diagnosis not present

## 2015-05-13 ENCOUNTER — Telehealth: Payer: Self-pay | Admitting: *Deleted

## 2015-05-13 NOTE — Telephone Encounter (Signed)
Verbal orders given  

## 2015-05-13 NOTE — Telephone Encounter (Signed)
Dr. Cherlyn Cushing (?) left vm on Dr. Lajoyce Lauber line thinking she was his pt but he is requesting a VO to continue therapy twice a week for 4 weeks.

## 2015-05-13 NOTE — Telephone Encounter (Signed)
Yes ok 

## 2015-05-28 ENCOUNTER — Other Ambulatory Visit (HOSPITAL_COMMUNITY): Payer: Self-pay | Admitting: Psychiatry

## 2015-05-28 NOTE — Telephone Encounter (Signed)
Received medication request from [facility] for Zoloft 186m. Per Dr. ADe Nurse medication request is denied. Lvm for pt to return call to office to schedule an appt for medication management. Pt was last seen in 02/2015.

## 2015-05-29 ENCOUNTER — Other Ambulatory Visit (HOSPITAL_COMMUNITY): Payer: Self-pay | Admitting: Psychiatry

## 2015-05-29 ENCOUNTER — Ambulatory Visit (INDEPENDENT_AMBULATORY_CARE_PROVIDER_SITE_OTHER): Payer: Commercial Managed Care - HMO

## 2015-05-29 DIAGNOSIS — Z1231 Encounter for screening mammogram for malignant neoplasm of breast: Secondary | ICD-10-CM | POA: Diagnosis not present

## 2015-05-29 MED ORDER — QUETIAPINE FUMARATE 200 MG PO TABS: 200.0000 mg | ORAL_TABLET | Freq: Every day | ORAL | Status: DC

## 2015-05-29 NOTE — Telephone Encounter (Signed)
PT called for a refill for Seroquel 241m and Zoloft 1037m Per Dr. AkDe Nursept is authorized for a refill for Seroquel 20068m#12 and Zoloft 100m54m24. Rx was sent to pharmacy. Called and informed pt of prescription status. Pt has a f/u appt on 06/08/15. Pt verbalizes understanding.

## 2015-06-02 ENCOUNTER — Telehealth: Payer: Self-pay

## 2015-06-02 NOTE — Telephone Encounter (Signed)
-----   Message from Donella Stade, PA-C sent at 06/02/2015 12:00 PM EST ----- Call pt: normal mammogram follow up in one year.

## 2015-06-02 NOTE — Telephone Encounter (Signed)
Patient aware of mammogram results and recommendations.

## 2015-06-08 ENCOUNTER — Ambulatory Visit (HOSPITAL_COMMUNITY): Payer: Commercial Managed Care - HMO | Admitting: Psychiatry

## 2015-06-09 ENCOUNTER — Other Ambulatory Visit: Payer: Self-pay | Admitting: Physician Assistant

## 2015-06-09 DIAGNOSIS — D100 Benign neoplasm of lip: Secondary | ICD-10-CM | POA: Diagnosis not present

## 2015-06-17 ENCOUNTER — Telehealth (HOSPITAL_COMMUNITY): Payer: Self-pay | Admitting: *Deleted

## 2015-06-17 MED ORDER — QUETIAPINE FUMARATE 200 MG PO TABS: 200.0000 mg | ORAL_TABLET | Freq: Every day | ORAL | Status: DC

## 2015-06-17 MED ORDER — SERTRALINE HCL 100 MG PO TABS: 200.0000 mg | ORAL_TABLET | Freq: Every day | ORAL | Status: DC

## 2015-06-17 NOTE — Telephone Encounter (Signed)
Pt called for a refill for Seroquel 219m and Zoloft 2089m Per Dr. AkDe Nursept is authorized for a refill for Seroquel 20065m#5 and Zoloft 200m19m10. Prescriptions was sent to pharmacy. Called and informed pt the refill is enough until her appt on 06/22/15. Informed pt if she miss her appt on 1/23, no further refills will be issued until seen. Pt verbalizes understanding.

## 2015-06-22 ENCOUNTER — Ambulatory Visit (INDEPENDENT_AMBULATORY_CARE_PROVIDER_SITE_OTHER): Payer: Medicare HMO | Admitting: Psychiatry

## 2015-06-22 ENCOUNTER — Encounter (HOSPITAL_COMMUNITY): Payer: Self-pay | Admitting: Psychiatry

## 2015-06-22 VITALS — BP 130/82 | HR 83 | Ht 62.5 in | Wt 211.0 lb

## 2015-06-22 DIAGNOSIS — F4323 Adjustment disorder with mixed anxiety and depressed mood: Secondary | ICD-10-CM

## 2015-06-22 DIAGNOSIS — F4310 Post-traumatic stress disorder, unspecified: Secondary | ICD-10-CM | POA: Diagnosis not present

## 2015-06-22 DIAGNOSIS — F331 Major depressive disorder, recurrent, moderate: Secondary | ICD-10-CM | POA: Diagnosis not present

## 2015-06-22 MED ORDER — QUETIAPINE FUMARATE 200 MG PO TABS: 200.0000 mg | ORAL_TABLET | Freq: Every day | ORAL | Status: DC

## 2015-06-22 MED ORDER — SERTRALINE HCL 100 MG PO TABS: 200.0000 mg | ORAL_TABLET | Freq: Every day | ORAL | Status: DC

## 2015-06-22 NOTE — Progress Notes (Signed)
Patient ID: Autumn Bowen, female   DOB: [birth year], 70 y.o.   MRN: [id number]   [facility] Follow-up Outpatient Visit    Autumn Bowen 1945-07-11  Date: 06/21/2015    Chief Complaint: Follow up and medication management.   HISTORY OF CURRENT ILLNESS:  Autumn Bowen is a 70 y/o female with a past psychiatric history significant for a Posttraumatic stress disorder, and Major Depressive Disorder. Adjustment disorder with anxiety   Follows up with her providers regularly. No current manic or severe depression.  She still misses her mom and dad who died 72 years ago but she still grieves about them. She will visit to her sister that helped. She talks about her pain and also now back on Lyrica that is helping her mood PTSD: triggers and crowds, including closed spaces makes her panicky. zoloft helps She also takes seroquel no involuntary movements. Still has days feeling down but not hopeless. Has family conflicts which makes her mood worse. Medical complexity: Hypothyroid and other medical conditions can effect depression. Patient adjusting and follow up with providers. Also backpain effects her sleep and mood  Modifying factors: She has a Midwife. She is a avid reader. Anxiety around crowds with occasional panic attacks. Patient denies current suicidal ideation, intent, or plan. Patient denies current homicidal ideation, intent, or plan  No involuntary movements on seroquel.   Review of Systems : psychiatry; anxiety in closed spaces. Some insomnia.  Neurological: no stiffness or tremors. Constitutional: Negative for fever, chills, diaphoresis, activity change, appetite change and fatigue.   Cardiovascular: Negative for chest pain, palpitations  Gastrointestinal:  Negative for vomiting, diarrhea and constipation.   Filed Vitals:   06/22/15 1106  BP: 130/82  Pulse: 83  Height: 5' 2.5" (1.588 m)  Weight: 211 lb (95.709 kg)  SpO2: 91%   Physical Exam   Vitals reviewed.  Constitutional: She appears well-developed and well-nourished. No distress.  Skin: She is not diaphoretic.  Musculoskeletal: Strength & Muscle Tone: within normal limits Gait & Station: normal Patient leans: N/A   MENTAL ILLNESS AND SUBSTANCE ABUSE IN FAMILY MEMBERS: Reviewed  Psychiatric illness: Mother-nervous break down.  Substance abuse: Brother-uses drugs  Suicides: Patient denies   Past Medical History: No past medical history on file. Reviewed  Past Medical History  Diagnosis Date  . Diabetes mellitus type II   . Hypothyroidism   . Dyslipidemia   . Anemia   . Celiac disease   . Sore throat   . Diverticulitis   . Celiac disease     History of Loss of Consciousness: No  Seizure History: No  Cardiac History: No   Allergies: Reviewed Allergies  Allergen Reactions  . Atorvastatin   . Esomeprazole Magnesium   . Gluten Meal   . Ivp Dye [Iodinated Diagnostic Agents] Other (See Comments)    Per patient cardiac arrest  . Latex   . Penicillins   . Trazodone And Nefazodone Hives  . Zolpidem Tartrate Nausea And Vomiting     Current Medications: Reviewed      Family History: No family history on file.   Psychiatric Specialty Exam: Objective: Appearance: Casual   Eye Contact:: Good   Speech: Clear and Coherent and Normal Rate   Volume: Normal   Mood: upset when in pain and gets anxious Depression:6/10 (0=Very depressed; 5=Neutral; 10=Very Happy)  Anxiety- 7/10 (0=no anxiety; 5= moderate/tolerable anxiety; 10= panic attacks)   Affect: Appropriate, Congruent and Full Range   Thought Process: mildly circumstantial  Orientation:  Full   Thought Content: WDL   Suicidal Thoughts: No   Homicidal Thoughts: No   Judgement: Fair   Insight: Fair   Psychomotor Activity: Normal   Akathisia: No   Handed: Right   Memory: 3/3 immediate; Recent 3/3   AIMS (if indicated): Not indicated   Assets: Communication Skills  Desire for Improvement   Transportation     Assessment:  AXIS I  Post Traumatic Stress Disorder, Major depressive Disorder , adjustment disorder with anxiety. Mood disorder due to [facility] including back pain  AXIS II  No diagnosis   AXIS III  No past medical history on file.   AXIS IV  problems with primary support group   AXIS V  GAF: 52   Treatment Plan/Recommendations:  PLAN:  1. Affirm with the patient that the medications are taken as ordered. Patient expressed understanding of how their medications were to be used.  2. Continue the following psychiatric medications as written prior to this appointment with the following changes:  a) Sertraline 100 mg-two tablets daily for PTSD and depression.  No change done.Prescriptions sent.  b) Will continue quetiapine-regular release 200 mg-Take one tablet daily at bedtime. She understand to closely follow with her providers and get her blood glucose done. She is getting treatment for diabetes.  Labs followed with primary care. More than 50% time spent in counseling and coordination of care including patient education 3. Therapy: brief supportive therapy provided. Discussed psychosocial stressors in detail.  4. Risks and benefits, side effects and alternatives discussed with patient, she was given an opportunity to ask questions about her medication, illness, and treatment. All current medications have been reviewed and discussed with the patient and adjusted as clinically appropriate. The patient has been provided an accurate and updated list of the medications being now prescribed.  5. Patient told to call clinic if any problems occur. Patient advised to go to ER if she should develop SI/HI, side effects, or if symptoms worsen.  Sleep hygiene was reviewed.  Follow up in 2 months.  Time spent: 25 minutes  Autumn Bowen, M.D.  06/22/2015 11:23 AM

## 2015-08-05 ENCOUNTER — Other Ambulatory Visit: Payer: Self-pay | Admitting: Physician Assistant

## 2015-08-05 ENCOUNTER — Telehealth: Payer: Self-pay | Admitting: Physician Assistant

## 2015-08-05 NOTE — Telephone Encounter (Signed)
I called pt and left a message informing her that she is due for her Diabetic f/u with Luvenia Starch and to call our office to schedule this appt

## 2015-08-29 ENCOUNTER — Other Ambulatory Visit (HOSPITAL_COMMUNITY): Payer: Self-pay | Admitting: Psychiatry

## 2015-09-01 NOTE — Telephone Encounter (Signed)
Received medication request from [facility] for Seroquel 221m and Zoloft 1035m Per Dr. AkDe Nursemedication request is denied. Rx was sent to pharmacy on 06/22/15 w/ 2 refills. Pt is schedule for a f/u appt on 09/22/15.

## 2015-09-02 ENCOUNTER — Other Ambulatory Visit: Payer: Self-pay | Admitting: Physician Assistant

## 2015-09-15 ENCOUNTER — Other Ambulatory Visit: Payer: Self-pay | Admitting: Physician Assistant

## 2015-09-17 ENCOUNTER — Other Ambulatory Visit: Payer: Self-pay | Admitting: Physician Assistant

## 2015-09-22 ENCOUNTER — Ambulatory Visit (HOSPITAL_COMMUNITY): Payer: Self-pay | Admitting: Psychiatry

## 2015-09-29 ENCOUNTER — Other Ambulatory Visit: Payer: Self-pay | Admitting: Family Medicine

## 2015-10-01 ENCOUNTER — Other Ambulatory Visit (HOSPITAL_COMMUNITY): Payer: Self-pay | Admitting: Psychiatry

## 2015-10-09 NOTE — Telephone Encounter (Signed)
Received medication request from [facility] for Seroquel and Zoloft. Per Dr. De Nurse, pt is authorized for refills Seroquel 216m, #7 and Zoloft 109m #14. Pt was informed refill is enough until appt on 10/13/15. Pt was informed if appt was missed, no additional refills will be authorized until seen by provider. Pt verbalizes understanding.

## 2015-10-13 ENCOUNTER — Ambulatory Visit (INDEPENDENT_AMBULATORY_CARE_PROVIDER_SITE_OTHER): Payer: Commercial Managed Care - HMO | Admitting: Psychiatry

## 2015-10-13 ENCOUNTER — Encounter (HOSPITAL_COMMUNITY): Payer: Self-pay | Admitting: Psychiatry

## 2015-10-13 DIAGNOSIS — F329 Major depressive disorder, single episode, unspecified: Secondary | ICD-10-CM

## 2015-10-13 DIAGNOSIS — F0630 Mood disorder due to known physiological condition, unspecified: Secondary | ICD-10-CM | POA: Diagnosis not present

## 2015-10-13 DIAGNOSIS — F4310 Post-traumatic stress disorder, unspecified: Secondary | ICD-10-CM

## 2015-10-13 DIAGNOSIS — F331 Major depressive disorder, recurrent, moderate: Secondary | ICD-10-CM

## 2015-10-13 DIAGNOSIS — F4322 Adjustment disorder with anxiety: Secondary | ICD-10-CM | POA: Diagnosis not present

## 2015-10-13 DIAGNOSIS — F4323 Adjustment disorder with mixed anxiety and depressed mood: Secondary | ICD-10-CM

## 2015-10-13 MED ORDER — QUETIAPINE FUMARATE 200 MG PO TABS: ORAL_TABLET | ORAL | Status: DC

## 2015-10-13 MED ORDER — SERTRALINE HCL 100 MG PO TABS: 200.0000 mg | ORAL_TABLET | Freq: Every day | ORAL | Status: DC

## 2015-10-13 NOTE — Progress Notes (Signed)
Patient ID: PELLA ORIGER, female   DOB: 1945-08-10, 70 y.o.   MRN: [id number]   [facility] Follow-up Outpatient Visit    Autumn Bowen November 19, 1945  Date: 10/13/2015    Chief Complaint: Follow up and medication management.   HISTORY OF CURRENT ILLNESS:  Autumn Bowen is a 70 y/o female with a past psychiatric history significant for a Posttraumatic stress disorder, and Major Depressive Disorder. Adjustment disorder with anxiety   Follows up with her providers regularly. No current manic or severe depression.  She still misses her mom and dad who died 22 years ago but she still grieves about them. She will visit  her sister that helped. She talks about her pain and also now back on Lyrica that is helping her mood  PTSD: triggers and crowds, including closed spaces makes her panicky. zoloft helps  Has family conflicts which makes her mood worse. seroquel helps mood and sleep. Medical complexity: Hypothyroid and other medical conditions can effect depression. Patient adjusting and follow up with providers. Also backpain effects her sleep and mood  Modifying factors: She has a Secondary school teacher. She is a avid reader. Anxiety around crowds with occasional panic attacks. Patient denies current suicidal ideation, intent, or plan. Patient denies current homicidal ideation, intent, or plan  No involuntary movements on seroquel.   Review of Systems : psychiatry; anxiety in closed spaces. Intermittent insomnia.  Neurological: no stiffness or tremors. Constitutional: Negative for fever, chills, diaphoresis, activity change, appetite change and fatigue.   Cardiovascular: Negative for chest pain, palpitations  Gastrointestinal:  Negative for vomiting, diarrhea and constipation.   There were no vitals filed for this visit. Physical Exam  Vitals reviewed.  Constitutional: She appears well-developed and well-nourished. No distress.  Skin: She is not diaphoretic.   Musculoskeletal: Strength & Muscle Tone: within normal limits Gait & Station: normal Patient leans: N/A   Past Medical History: No past medical history on file. Reviewed  Past Medical History  Diagnosis Date  . Diabetes mellitus type II   . Hypothyroidism   . Dyslipidemia   . Anemia   . Celiac disease   . Sore throat   . Diverticulitis   . Celiac disease     History of Loss of Consciousness: No  Seizure History: No  Cardiac History: No   Allergies: Reviewed Allergies  Allergen Reactions  . Atorvastatin   . Esomeprazole Magnesium   . Gluten Meal   . Ivp Dye [Iodinated Diagnostic Agents] Other (See Comments)    Per patient cardiac arrest  . Latex   . Penicillins   . Trazodone And Nefazodone Hives  . Zolpidem Tartrate Nausea And Vomiting     Current Medications: Reviewed    Family History: No family history on file.   Psychiatric Specialty Exam: Objective: Appearance: Casual   Eye Contact:: Good   Speech: Clear and Coherent and Normal Rate   Volume: Normal   Mood: upset when in pain otherwise euthymic Depression:6/10 (0=Very depressed; 5=Neutral; 10=Very Happy)  Anxiety- 7/10 (0=no anxiety; 5= moderate/tolerable anxiety; 10= panic attacks)   Affect: Appropriate, Congruent and Full Range   Thought Process: mildly circumstantial  Orientation: Full   Thought Content: WDL   Suicidal Thoughts: No   Homicidal Thoughts: No   Judgement: Fair   Insight: Fair   Psychomotor Activity: Normal   Akathisia: No   Handed: Right   Memory: 3/3 immediate; Recent 3/3   AIMS (if indicated): Not indicated   Assets: Communication Skills  Desire  for Improvement  Transportation     Assessment:  AXIS I  Post Traumatic Stress Disorder, Major depressive Disorder , adjustment disorder with anxiety. Mood disorder due to [facility] including back pain  AXIS II  No diagnosis   AXIS III  No past medical history on file.   AXIS IV  problems with primary support group   AXIS V  GAF: 52    Treatment Plan/Recommendations:  PLAN:  1. Affirm with the patient that the medications are taken as ordered. Patient expressed understanding of how their medications were to be used.  2. Continue the following psychiatric medications as written prior to this appointment with the following changes:  a) Sertraline 100 mg-two tablets daily for PTSD and depression.  No change done.Prescriptions sent.  b) Will continue quetiapine-regular release 200 mg-Take one tablet daily at bedtime. She understand to closely follow with her providers and get her blood glucose done. She is getting treatment for diabetes.  Labs followed with primary care. No change in med done today More than 50% time spent in counseling and coordination of care including patient education 3. Therapy: brief supportive therapy provided. Discussed psychosocial stressors in detail.  4. Risks and benefits, side effects and alternatives discussed with patient, she was given an opportunity to ask questions about her medication, illness, and treatment. All current medications have been reviewed and discussed with the patient and adjusted as clinically appropriate. The patient has been provided an accurate and updated list of the medications being now prescribed.  5. Patient told to call clinic if any problems occur. Patient advised to go to ER if she should develop SI/HI, side effects, or if symptoms worsen.  Sleep hygiene was reviewed.  Follow up in 2 months. . She wants to come in 3 months Time spent: 25 minutes  Yosiah Jasmin Gilmore Laroche, M.D.  10/13/2015 11:47 AM

## 2015-11-17 ENCOUNTER — Other Ambulatory Visit: Payer: Self-pay | Admitting: Physician Assistant

## 2015-11-19 ENCOUNTER — Other Ambulatory Visit: Payer: Self-pay | Admitting: Physician Assistant

## 2015-11-21 ENCOUNTER — Other Ambulatory Visit: Payer: Self-pay | Admitting: Physician Assistant

## 2015-11-23 ENCOUNTER — Encounter: Payer: Self-pay | Admitting: Physician Assistant

## 2015-11-23 ENCOUNTER — Other Ambulatory Visit: Payer: Self-pay

## 2015-11-23 ENCOUNTER — Ambulatory Visit (INDEPENDENT_AMBULATORY_CARE_PROVIDER_SITE_OTHER): Payer: Commercial Managed Care - HMO | Admitting: Physician Assistant

## 2015-11-23 VITALS — BP 110/85 | HR 85 | Ht 62.5 in | Wt 209.0 lb

## 2015-11-23 DIAGNOSIS — W57XXXA Bitten or stung by nonvenomous insect and other nonvenomous arthropods, initial encounter: Secondary | ICD-10-CM

## 2015-11-23 DIAGNOSIS — E118 Type 2 diabetes mellitus with unspecified complications: Secondary | ICD-10-CM | POA: Diagnosis not present

## 2015-11-23 DIAGNOSIS — T148 Other injury of unspecified body region: Secondary | ICD-10-CM

## 2015-11-23 DIAGNOSIS — R809 Proteinuria, unspecified: Secondary | ICD-10-CM | POA: Diagnosis not present

## 2015-11-23 LAB — POCT UA - MICROALBUMIN
Creatinine, POC: 200 mg/dL
Microalbumin Ur, POC: 80 mg/L

## 2015-11-23 LAB — POCT GLYCOSYLATED HEMOGLOBIN (HGB A1C): Hemoglobin A1C: 7.7

## 2015-11-23 MED ORDER — SITAGLIP PHOS-METFORMIN HCL ER 100-1000 MG PO TB24: ORAL_TABLET | ORAL | Status: DC

## 2015-11-23 MED ORDER — INSULIN GLARGINE 300 UNIT/ML ~~LOC~~ SOPN: 10.0000 [IU] | PEN_INJECTOR | Freq: Every day | SUBCUTANEOUS | Status: DC

## 2015-11-23 MED ORDER — TRIAMCINOLONE ACETONIDE 0.1 % EX CREA: 1.0000 "application " | TOPICAL_CREAM | Freq: Two times a day (BID) | CUTANEOUS | Status: DC

## 2015-11-23 MED ORDER — LISINOPRIL 2.5 MG PO TABS: 2.5000 mg | ORAL_TABLET | Freq: Every day | ORAL | Status: DC

## 2015-11-23 MED ORDER — LEVOTHYROXINE SODIUM 175 MCG PO TABS: ORAL_TABLET | ORAL | Status: DC

## 2015-11-23 MED ORDER — PROPRANOLOL HCL ER 160 MG PO CP24: ORAL_CAPSULE | ORAL | Status: DC

## 2015-11-23 MED ORDER — DULAGLUTIDE 0.75 MG/0.5ML ~~LOC~~ SOAJ: SUBCUTANEOUS | Status: DC

## 2015-11-23 NOTE — Patient Instructions (Signed)
toujeo 10 units at bedtime and increase by 2 units every 5 days until fasting glucose is below 120. Continue on trulicity and janumet.  Start lisinopril 2.25m once daily to protect kidneys.  Triamcinolone for bug bites.

## 2015-11-23 NOTE — Progress Notes (Signed)
   Subjective:    Patient ID: Autumn Bowen, female    DOB: 1945-11-07, 70 y.o.   MRN: [id number]  HPI  Pt is a 70 yo female who presents to the clinic for DM follow up. She is on trulicity and janumet. Checks fasting glucose and range from 140's to 190's. Denies any hypoglyemia. She does have a tick bit on left calf that is not healing. She is concerned. Denies any fever, chills, warmth, tenderness, rash, n/v/d.   She does complain of "itchy bug bites". When she goes outside "they love her".    Review of Systems  All other systems reviewed and are negative.      Objective:   Physical Exam  Constitutional: She is oriented to person, place, and time. She appears well-developed and well-nourished.  HENT:  Head: Normocephalic and atraumatic.  Right Ear: External ear normal.  Left Ear: External ear normal.  Nose: Nose normal.  Mouth/Throat: Oropharynx is clear and moist. No oropharyngeal exudate.  Cardiovascular: Normal rate, regular rhythm and normal heart sounds.   Pulmonary/Chest: Effort normal and breath sounds normal. She has no wheezes.  Neurological: She is alert and oriented to person, place, and time.  Skin:  33m erythematous scabbed over papule of left posterior calf. No induration, warmth, tenderness, or discharge.   Psychiatric: She has a normal mood and affect. Her behavior is normal.          Assessment & Plan:  DM type II- ..Marland KitchenLab Results  Component Value Date   HGBA1C 7.7 11/23/2015   7.7 Down from 7.8 6 months ago.  Positive for protein on mircoalbumin. Started lisinopril 2.544m cmp ordered.  Add toujeo 10 units at bedtime with increase every 5 days by 2 units until fasting glucose 120 or below.  Continue trulicity and janumet.  Encouraged to get eye exam.  Follow up in 3 months.   Tick bite- reassured patient appears may be slow to heal due to DM. bactroban samples given in office. Follow up with any signs of infection fever, hardness, redness, pain. No  signs of lymes or RMSF.   Bug bites- triamcinolone given as needed for itching. Discussed prevention.   Hypothyroidism- ordered TSH. Refilled for 3 months.

## 2015-11-24 ENCOUNTER — Telehealth: Payer: Self-pay

## 2015-11-24 ENCOUNTER — Other Ambulatory Visit: Payer: Self-pay | Admitting: Physician Assistant

## 2015-11-24 DIAGNOSIS — W57XXXA Bitten or stung by nonvenomous insect and other nonvenomous arthropods, initial encounter: Secondary | ICD-10-CM

## 2015-11-24 DIAGNOSIS — T148 Other injury of unspecified body region: Secondary | ICD-10-CM | POA: Diagnosis not present

## 2015-11-24 LAB — COMPLETE METABOLIC PANEL WITH GFR
ALT: 23 U/L (ref 6–29)
AST: 26 U/L (ref 10–35)
Albumin: 4.2 g/dL (ref 3.6–5.1)
Alkaline Phosphatase: 45 U/L (ref 33–130)
BUN: 12 mg/dL (ref 7–25)
CO2: 30 mmol/L (ref 20–31)
Calcium: 9.4 mg/dL (ref 8.6–10.4)
Chloride: 101 mmol/L (ref 98–110)
Creat: 0.78 mg/dL (ref 0.60–0.93)
GFR, Est African American: 89 mL/min (ref >=60)
GFR, Est Non African American: 77 mL/min (ref >=60)
Glucose, Bld: 148 mg/dL — ABNORMAL HIGH (ref 65–99)
Potassium: 4.6 mmol/L (ref 3.5–5.3)
Sodium: 138 mmol/L (ref 135–146)
Total Bilirubin: 0.4 mg/dL (ref 0.2–1.2)
Total Protein: 6.6 g/dL (ref 6.1–8.1)

## 2015-11-24 NOTE — Telephone Encounter (Signed)
Printed off. Will fax down to lab to have drawn.

## 2015-11-24 NOTE — Telephone Encounter (Signed)
Patient advised.

## 2015-11-24 NOTE — Telephone Encounter (Signed)
Mrs Chico called and states her dog's doctor recommends she gets a lyme's disease test. She would like for Jade to order this test.

## 2015-11-27 LAB — SJOGRENS SYNDROME-B EXTRACTABLE NUCLEAR ANTIBODY: SSB (La) (ENA) Antibody, IgG: <1

## 2015-11-27 LAB — ANTI-DNA ANTIBODY, DOUBLE-STRANDED: ds DNA Ab: <1 IU/mL

## 2015-11-27 LAB — ANTI-SMITH ANTIBODY: ENA SM Ab Ser-aCnc: <1

## 2015-11-27 LAB — ANTI-SCLERODERMA ANTIBODY: Scleroderma (Scl-70) (ENA) Antibody, IgG: <1

## 2015-11-27 LAB — ANTI-RIBONUCLEIC ACID ANTIBODY: SM/RNP: <1

## 2015-11-27 LAB — LYME AB/WESTERN BLOT REFLEX: B burgdorferi Ab IgG+IgM: <0.9 Index (ref <0.90)

## 2015-11-27 LAB — JO-1 ANTIBODY-IGG: Jo-1 Antibody, IgG: <1

## 2015-11-27 LAB — SJOGRENS SYNDROME-A EXTRACTABLE NUCLEAR ANTIBODY: SSA (Ro) (ENA) Antibody, IgG: <1

## 2015-11-28 ENCOUNTER — Other Ambulatory Visit (HOSPITAL_COMMUNITY): Payer: Self-pay | Admitting: Psychiatry

## 2015-11-30 ENCOUNTER — Other Ambulatory Visit: Payer: Self-pay | Admitting: Family Medicine

## 2015-12-03 ENCOUNTER — Other Ambulatory Visit: Payer: Self-pay | Admitting: Physician Assistant

## 2015-12-03 NOTE — Telephone Encounter (Signed)
Pt contacted office today stating she believes she cannot tolerate the Toujeo Rx. Pt reports having generalized abdominal pain and nausea since starting Rx. Pt states the nausea is so bad when she lays down she will actually vomit, Pt is prescribed a nausea Rx and even that "doesn't help." Pt is also on Trulicity weekly, reports she has never had side effects from it. Pt AM blood sugar readings range from 108-149. Will route to PCP and a Provider in office today for review.

## 2015-12-03 NOTE — Telephone Encounter (Signed)
Patient skipped dose and see if she feels a lot better if she doesn't take it. The nausea and vomiting would be extremely unusual. About the only GI side effect is actually diarrhea. But certainly if she skips a dose and feels better than it could be the cause of which case we might need to find a different insulin for her.

## 2015-12-03 NOTE — Telephone Encounter (Signed)
Pt advised. Informed her to contact our office before closing tomorrow with an update so any medication adjustments can be made before the weekend.

## 2015-12-04 MED ORDER — INSULIN DEGLUDEC 200 UNIT/ML ~~LOC~~ SOPN: 10.0000 [IU] | PEN_INJECTOR | Freq: Every day | SUBCUTANEOUS | Status: DC

## 2015-12-04 NOTE — Telephone Encounter (Signed)
Pt has been advised of new Rx. Will pick up today.

## 2015-12-04 NOTE — Telephone Encounter (Signed)
Ok to send over tresbia at same units she was doing toujeo. This is a different molecule so hopefully you will tolerate.

## 2015-12-04 NOTE — Addendum Note (Signed)
Addended by: Huel Cote on: 12/04/2015 04:25 PM   Modules accepted: Orders

## 2015-12-04 NOTE — Telephone Encounter (Signed)
Pt called clinic today stating her symptoms are better today since she stopped the Toujeo. Pt reports her AM blood sugar today was 149. Will route to PCP for recommendation.

## 2015-12-14 NOTE — Telephone Encounter (Signed)
Received fax from [facility] requesting refill for Zoloft. Medication refill was escribed to pharmacy on 10/13/15 w/ 2 refills. Per Dr. De Nurse, refill request is denied. Pt has request refill too early. Pt refill is not due until 8/16. Pt is schedule for a f/u appt on 8/10. Called and informed pt about refill status. Pt shows understanding.

## 2015-12-18 ENCOUNTER — Other Ambulatory Visit (HOSPITAL_COMMUNITY): Payer: Self-pay | Admitting: Psychiatry

## 2015-12-18 ENCOUNTER — Other Ambulatory Visit: Payer: Self-pay | Admitting: Physician Assistant

## 2015-12-18 ENCOUNTER — Other Ambulatory Visit: Payer: Self-pay | Admitting: Sports Medicine

## 2015-12-21 NOTE — Telephone Encounter (Signed)
Fax received from [facility] requesting a refill for Zoloft. Per Dr. De Nurse, refill request is denied. Refill was request too early.  Refill was escribed to pharmacy on 10/13/15 for Zoloft 145m, #30 w/ 2 refills. Called and informed pt of refill status. Pt shows understanding.

## 2015-12-31 ENCOUNTER — Other Ambulatory Visit (HOSPITAL_COMMUNITY): Payer: Self-pay | Admitting: Psychiatry

## 2016-01-06 NOTE — Telephone Encounter (Signed)
Received fax from [facility] requesting a refill for Zoloft 190m. Per Dr. ADe Nurse refill request is denied. Pt request refill too early. Pt f/u appt is 01/07/16. Called and informed pt of refill status. Pt shows understanding.

## 2016-01-07 ENCOUNTER — Encounter (HOSPITAL_COMMUNITY): Payer: Self-pay | Admitting: Psychiatry

## 2016-01-07 ENCOUNTER — Ambulatory Visit (INDEPENDENT_AMBULATORY_CARE_PROVIDER_SITE_OTHER): Payer: Commercial Managed Care - HMO | Admitting: Psychiatry

## 2016-01-07 VITALS — BP 130/80 | HR 75 | Ht 62.5 in | Wt 210.4 lb

## 2016-01-07 DIAGNOSIS — F331 Major depressive disorder, recurrent, moderate: Secondary | ICD-10-CM | POA: Diagnosis not present

## 2016-01-07 DIAGNOSIS — F4323 Adjustment disorder with mixed anxiety and depressed mood: Secondary | ICD-10-CM | POA: Diagnosis not present

## 2016-01-07 DIAGNOSIS — F4310 Post-traumatic stress disorder, unspecified: Secondary | ICD-10-CM | POA: Diagnosis not present

## 2016-01-07 MED ORDER — QUETIAPINE FUMARATE 200 MG PO TABS
ORAL_TABLET | ORAL | 2 refills | Status: DC
Start: 2016-01-07 — End: 2016-06-04

## 2016-01-07 MED ORDER — SERTRALINE HCL 100 MG PO TABS: 200.0000 mg | ORAL_TABLET | Freq: Every day | ORAL | 2 refills | Status: DC

## 2016-01-07 NOTE — Progress Notes (Signed)
Patient ID: Autumn Bowen, female   DOB: 1946-03-26, 70 y.o.   MRN: [id number]   [facility] Follow-up Outpatient Visit    Autumn Bowen 08-13-1945  Date: 01/07/2016    Chief Complaint: Follow up and medication management.   HISTORY OF CURRENT ILLNESS:  [name] is a 70 y/o female with a past psychiatric history significant for a Posttraumatic stress disorder, and Major Depressive Disorder. Adjustment disorder with anxiety   Follows up with her providers regularly. No current manic or severe depression.  Autumn Bowen still misses her mom and dad who died 22 years ago but Autumn Bowen still grieves about them. Autumn Bowen will visit  her sister that helped. Family upsets effects her day. Her granddaughter was in hospital. Tolerating meds. Sleep fair.  PTSD: triggers and crowds, including closed spaces makes her panicky. zoloft helps  Medical complexity: Hypothyroid and other medical conditions can effect depression. Patient adjusting and follow up with providers. 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 Autumn Bowen is a avid reader. Anxiety around crowds with occasional panic attacks. Patient denies current suicidal ideation, intent, or plan. Patient denies current homicidal ideation, intent, or plan  No involuntary movements on seroquel.   Review of Systems : psychiatry; anxiety in closed spaces. Intermittent insomnia.  Neurological: no stiffness or tremors. Constitutional: Negative for fever, chills, diaphoresis, activity change, appetite change and fatigue.   Cardiovascular: Negative for chest pain, palpitations  Gastrointestinal:  Negative for vomiting, diarrhea and constipation.   Vitals:   01/07/16 1509  BP: 130/80  BP Location: Right Arm  Patient Position: Sitting  Cuff Size: Normal  Pulse: 75  SpO2: 94%  Weight: 210 lb 6.4 oz (95.4 kg)  Height: 5' 2.5" (1.588 m)   Physical Exam  Vitals reviewed.  Constitutional: Autumn Bowen appears  well-developed and well-nourished. No distress.  Skin: Autumn Bowen is not diaphoretic.  Musculoskeletal: Strength & Muscle Tone: within normal limits Gait & Station: normal Patient leans: N/A   Past Medical History: No past medical history on file. Reviewed  Past Medical History:  Diagnosis Date  . Anemia   . Celiac disease   . Celiac disease   . Diabetes mellitus type II   . Diverticulitis   . Dyslipidemia   . Hypothyroidism   . Sore throat     History of Loss of Consciousness: No  Seizure History: No  Cardiac History: No   Allergies: Reviewed Allergies  Allergen Reactions  . Atorvastatin   . Esomeprazole Magnesium   . Gluten Meal   . Ivp Dye [Iodinated Diagnostic Agents] Other (See Comments)    Per patient cardiac arrest  . Latex   . Penicillins   . Trazodone And Nefazodone Hives  . Zolpidem Tartrate Nausea And Vomiting     Current Medications: Reviewed    Family History: No family history on file.   Psychiatric Specialty Exam: Objective: Appearance: Casual   Eye Contact:: Good   Speech: Clear and Coherent and Normal Rate   Volume: Normal   Mood: upset when in pain otherwise euthymic Depression:6/10 (0=Very depressed; 5=Neutral; 10=Very Happy)  Anxiety- 7/10 (0=no anxiety; 5= moderate/tolerable anxiety; 10= panic attacks)   Affect: Appropriate, Congruent and Full Range   Thought Process: less circumstantial  Orientation: Full   Thought Content: WDL   Suicidal Thoughts: No   Homicidal Thoughts: No   Judgement: Fair   Insight: Fair   Psychomotor Activity: Normal   Akathisia: No   Handed: Right  Memory: 3/3 immediate; Recent 3/3   AIMS (if indicated): Not indicated   Assets: Communication Skills  Desire for Improvement  Transportation     Assessment:  AXIS I  Post Traumatic Stress Disorder, Major depressive Disorder , adjustment disorder with anxiety. Mood disorder due to [facility] including back pain  AXIS II  No diagnosis   AXIS III  No past medical  history on file.   AXIS IV  problems with primary support group   AXIS V  GAF: 52   Treatment Plan/Recommendations:  PLAN:  1. Affirm with the patient that the medications are taken as ordered. Patient expressed understanding of how their medications were to be used.  2. Continue the following psychiatric medications as written prior to this appointment with the following changes:  a) Sertraline 100 mg-two tablets daily for PTSD and depression.  No change done.Prescriptions sent.  b) Will continue quetiapine-regular release 200 mg-Take one tablet daily at bedtime. Autumn Bowen understand to closely follow with her providers and get her blood glucose done. Autumn Bowen is getting treatment for diabetes.  Labs followed with primary care. No change in med done today Autumn Bowen is content with meds understands concerns and side effects.  More than 50% time spent in counseling and coordination of care including patient education Follow up in 2-3 months or earlier if needed.  Time spent: 25 minutes  [name], M.D.

## 2016-01-15 ENCOUNTER — Other Ambulatory Visit (HOSPITAL_COMMUNITY): Payer: Self-pay | Admitting: Psychiatry

## 2016-01-15 NOTE — Telephone Encounter (Signed)
Received fax from [facility] requesting a refill for Seroquel 218m. Per Dr. ADe Nurse refill request is denied. Refill was sent to pharmacy on 01/07/16 w/ 2 additional refills Pt is schedule for a f/u appt on 02/23/16.

## 2016-01-21 ENCOUNTER — Other Ambulatory Visit: Payer: Self-pay | Admitting: Physician Assistant

## 2016-01-28 ENCOUNTER — Other Ambulatory Visit: Payer: Self-pay | Admitting: Physician Assistant

## 2016-02-05 ENCOUNTER — Telehealth: Payer: Self-pay | Admitting: *Deleted

## 2016-02-23 ENCOUNTER — Other Ambulatory Visit: Payer: Self-pay | Admitting: Physician Assistant

## 2016-02-23 ENCOUNTER — Ambulatory Visit: Payer: Self-pay | Admitting: Physician Assistant

## 2016-02-26 ENCOUNTER — Other Ambulatory Visit: Payer: Self-pay | Admitting: Physician Assistant

## 2016-02-26 ENCOUNTER — Other Ambulatory Visit (HOSPITAL_COMMUNITY): Payer: Self-pay | Admitting: Psychiatry

## 2016-02-29 ENCOUNTER — Ambulatory Visit (HOSPITAL_COMMUNITY): Payer: Self-pay | Admitting: Psychiatry

## 2016-03-01 ENCOUNTER — Telehealth: Payer: Self-pay | Admitting: Physician Assistant

## 2016-03-01 ENCOUNTER — Ambulatory Visit (HOSPITAL_COMMUNITY): Payer: Self-pay | Admitting: Psychiatry

## 2016-03-01 NOTE — Telephone Encounter (Signed)
Patient called adv that she does not care for the  psychologist downstairs at [place] and would like to be referred to someone else in [place]. Thanks

## 2016-03-01 NOTE — Telephone Encounter (Signed)
Autumn Bowen, can you refer her to someone else in [place] for counseling.

## 2016-03-04 ENCOUNTER — Other Ambulatory Visit: Payer: Self-pay | Admitting: Physician Assistant

## 2016-03-04 DIAGNOSIS — F331 Major depressive disorder, recurrent, moderate: Secondary | ICD-10-CM

## 2016-03-09 NOTE — Telephone Encounter (Signed)
Medication refill- received fax from [facility] requesting a refill for Zoloft. Per Dr. De Nurse, refill request is denied. Medication refill was last filled on 01/07/16 for Zoloft 147m, #60 w/ 2 refills. Pt no show appt on 03/01/16. Pt will need to schedule an appointment with office.

## 2016-03-22 NOTE — Telephone Encounter (Signed)
error 

## 2016-03-25 ENCOUNTER — Ambulatory Visit (INDEPENDENT_AMBULATORY_CARE_PROVIDER_SITE_OTHER): Payer: Commercial Managed Care - HMO | Admitting: Physician Assistant

## 2016-03-25 VITALS — BP 130/89 | HR 94 | Ht 62.5 in | Wt 214.0 lb

## 2016-03-25 DIAGNOSIS — Z720 Tobacco use: Secondary | ICD-10-CM

## 2016-03-25 DIAGNOSIS — E118 Type 2 diabetes mellitus with unspecified complications: Secondary | ICD-10-CM

## 2016-03-25 DIAGNOSIS — E039 Hypothyroidism, unspecified: Secondary | ICD-10-CM | POA: Diagnosis not present

## 2016-03-25 DIAGNOSIS — K0381 Cracked tooth: Secondary | ICD-10-CM

## 2016-03-25 DIAGNOSIS — Z23 Encounter for immunization: Secondary | ICD-10-CM

## 2016-03-25 DIAGNOSIS — G629 Polyneuropathy, unspecified: Secondary | ICD-10-CM | POA: Diagnosis not present

## 2016-03-25 DIAGNOSIS — I10 Essential (primary) hypertension: Secondary | ICD-10-CM

## 2016-03-25 LAB — POCT GLYCOSYLATED HEMOGLOBIN (HGB A1C): Hemoglobin A1C: 7.8

## 2016-03-25 MED ORDER — PROPRANOLOL HCL ER 160 MG PO CP24: 160.0000 mg | ORAL_CAPSULE | Freq: Every day | ORAL | 5 refills | Status: DC

## 2016-03-25 MED ORDER — LISINOPRIL 2.5 MG PO TABS: 2.5000 mg | ORAL_TABLET | Freq: Every day | ORAL | 5 refills | Status: DC

## 2016-03-25 MED ORDER — SITAGLIP PHOS-METFORMIN HCL ER 100-1000 MG PO TB24: ORAL_TABLET | ORAL | 5 refills | Status: DC

## 2016-03-25 MED ORDER — PREGABALIN 50 MG PO CAPS: ORAL_CAPSULE | ORAL | 5 refills | Status: DC

## 2016-03-25 NOTE — Progress Notes (Signed)
   Subjective:    Patient ID: Autumn Bowen, female    DOB: April 10, 1946, 70 y.o.   MRN: [id number]  HPI  Pt is a 70 yo female who presents to the clinic for follow up.   DM- she is very "unsure about which medications she is taking". She does not think she is taking trulciity "because they did not send her the right stuff". She believes she is taking tresbia 16 units at bedtime. She has neuropathy of both feet. She denies any hypoglycemia symptoms. She has no open wounds or sores.   She continues to smoke 10-12 cigarettes a day. She tried to do the patch but made her crave cigareetes more.   She has a tooth that is cracked upper palate right 1st molar. It has started to hurt some. No fever, chills, n/v/d.    Review of Systems  All other systems reviewed and are negative.      Objective:   Physical Exam  Constitutional: She is oriented to person, place, and time. She appears well-developed and well-nourished.  HENT:  Head: Normocephalic and atraumatic.  Poor dentition. Right upper 1st molar cracked.   Cardiovascular: Normal rate, regular rhythm and normal heart sounds.   Pulmonary/Chest: Effort normal and breath sounds normal.  Neurological: She is alert and oriented to person, place, and time.  Psychiatric: She has a normal mood and affect. Her behavior is normal.          Assessment & Plan:  .Marland KitchenLeonette was seen today for diabetes.  Diagnoses and all orders for this visit:  Type 2 diabetes mellitus with complication, without long-term current use of insulin (HCC) -     POCT HgB A1C -     SitaGLIPtin-MetFORMIN HCl (JANUMET XR) [phone] MG TB24; Take one tablet daily. -     lisinopril (PRINIVIL,ZESTRIL) 2.5 MG tablet; Take 1 tablet (2.5 mg total) by mouth daily. -     TSH -     COMPLETE METABOLIC PANEL WITH GFR  Hypothyroidism, unspecified type -     TSH -     COMPLETE METABOLIC PANEL WITH GFR  Neuropathy (HCC) -     pregabalin (LYRICA) 50 MG capsule; TAKE ONE CAPSULE  THREE TIMES DAILY -     TSH -     COMPLETE METABOLIC PANEL WITH GFR  Influenza vaccine needed -     Flu Vaccine QUAD 36+ mos PF IM (Fluarix & Fluzone Quad PF)  Broken or cracked tooth, nontraumatic -     Ambulatory referral to Dentistry  Tobacco abuse  Essential hypertension, benign -     propranolol ER (INDERAL LA) 160 MG SR capsule; Take 1 capsule (160 mg total) by mouth daily.   .. Lab Results  Component Value Date   HGBA1C 7.8% 03/25/2016  at 7.8 not at goal but has not been taking trulicity. She said because sent her the wrong pens. I asked her to bring in medications and all insulin to have nurse reconcile medications.  If not on trulcity I believe being on trulcity could help get to goal.  Encouraged her to get eye exam.   Will attempt to find some resources to help with tooth extraction that is needed.   Encouraged patient to cut back on smoking.   HTN- controlled medications refilled.  Flu shot given today without complication.l

## 2016-03-25 NOTE — Patient Instructions (Signed)
Please call back with insulin name and if or if not taking Trulicity.

## 2016-03-26 LAB — COMPLETE METABOLIC PANEL WITH GFR
ALT: 31 U/L — ABNORMAL HIGH (ref 6–29)
AST: 31 U/L (ref 10–35)
Albumin: 4 g/dL (ref 3.6–5.1)
Alkaline Phosphatase: 54 U/L (ref 33–130)
BUN: 12 mg/dL (ref 7–25)
CO2: 26 mmol/L (ref 20–31)
Calcium: 9.4 mg/dL (ref 8.6–10.4)
Chloride: 100 mmol/L (ref 98–110)
Creat: 0.63 mg/dL (ref 0.60–0.93)
GFR, Est African American: >89 mL/min (ref >=60)
GFR, Est Non African American: >89 mL/min (ref >=60)
Glucose, Bld: 185 mg/dL — ABNORMAL HIGH (ref 65–99)
Potassium: 4.5 mmol/L (ref 3.5–5.3)
Sodium: 136 mmol/L (ref 135–146)
Total Bilirubin: 0.4 mg/dL (ref 0.2–1.2)
Total Protein: 6.6 g/dL (ref 6.1–8.1)

## 2016-03-26 LAB — TSH: TSH: 0.94 mIU/L

## 2016-03-27 ENCOUNTER — Encounter: Payer: Self-pay | Admitting: Physician Assistant

## 2016-03-27 DIAGNOSIS — K0381 Cracked tooth: Secondary | ICD-10-CM | POA: Insufficient documentation

## 2016-03-28 ENCOUNTER — Other Ambulatory Visit: Payer: Self-pay | Admitting: *Deleted

## 2016-03-28 MED ORDER — LEVOTHYROXINE SODIUM 175 MCG PO TABS: ORAL_TABLET | ORAL | 3 refills | Status: DC

## 2016-04-01 ENCOUNTER — Other Ambulatory Visit: Payer: Self-pay | Admitting: Physician Assistant

## 2016-04-06 ENCOUNTER — Other Ambulatory Visit (HOSPITAL_COMMUNITY): Payer: Self-pay | Admitting: Psychiatry

## 2016-04-08 NOTE — Telephone Encounter (Signed)
Medication refill- received fax from [facility] requesting a refill for Zoloft. Per Dr. De Nurse, refill request is denied. Medication refill was last filled on 01/07/16 for Zoloft 148m, #60 w/ 2 refills. Pt no show appt on 03/01/16. lvm for pt to contact office to schedule an apt.

## 2016-04-09 ENCOUNTER — Other Ambulatory Visit (HOSPITAL_COMMUNITY): Payer: Self-pay | Admitting: Psychiatry

## 2016-04-11 ENCOUNTER — Telehealth: Payer: Self-pay

## 2016-04-11 NOTE — Telephone Encounter (Signed)
Patient scheduled for acute visit.

## 2016-04-11 NOTE — Telephone Encounter (Signed)
Autumn Bowen complains of nausea and diarrhea x 1 1/2 weeks. She is drinking normal but is eating only crackers, peaches and cottage cheese. She does have fever, chills and sweats. Can she get medication for symptom management or should she come in for an office visit? Please advise.

## 2016-04-11 NOTE — Telephone Encounter (Signed)
medication refill- received fax from [facility] requesting a refill for Zoloft. Per Dr. De Nurse, refill request is denied. Medication refill was last filled on 01/07/16 for Zoloft 173m, #60 w/ 2 refills. Pt no show appt on 03/01/16. lvm for pt to contact office to schedule an apt.

## 2016-04-11 NOTE — Telephone Encounter (Signed)
Try immodium OTC. If not improving or having abdominal pain then come in for office visit. Did she start any new medications recently?

## 2016-04-12 ENCOUNTER — Ambulatory Visit (INDEPENDENT_AMBULATORY_CARE_PROVIDER_SITE_OTHER): Payer: Commercial Managed Care - HMO

## 2016-04-12 ENCOUNTER — Ambulatory Visit (INDEPENDENT_AMBULATORY_CARE_PROVIDER_SITE_OTHER): Payer: Commercial Managed Care - HMO | Admitting: Family Medicine

## 2016-04-12 VITALS — BP 107/61 | HR 79 | Temp 97.7°F | Wt 215.0 lb

## 2016-04-12 DIAGNOSIS — I2510 Atherosclerotic heart disease of native coronary artery without angina pectoris: Secondary | ICD-10-CM | POA: Diagnosis not present

## 2016-04-12 DIAGNOSIS — K5730 Diverticulosis of large intestine without perforation or abscess without bleeding: Secondary | ICD-10-CM | POA: Diagnosis not present

## 2016-04-12 DIAGNOSIS — R197 Diarrhea, unspecified: Secondary | ICD-10-CM | POA: Diagnosis not present

## 2016-04-12 DIAGNOSIS — R1084 Generalized abdominal pain: Secondary | ICD-10-CM

## 2016-04-12 DIAGNOSIS — K5732 Diverticulitis of large intestine without perforation or abscess without bleeding: Secondary | ICD-10-CM | POA: Diagnosis not present

## 2016-04-12 DIAGNOSIS — R109 Unspecified abdominal pain: Secondary | ICD-10-CM | POA: Insufficient documentation

## 2016-04-12 DIAGNOSIS — I700 Atherosclerosis of aorta: Secondary | ICD-10-CM

## 2016-04-12 LAB — COMPLETE METABOLIC PANEL WITH GFR
ALT: 21 U/L (ref 6–29)
AST: 23 U/L (ref 10–35)
Albumin: 4.1 g/dL (ref 3.6–5.1)
Alkaline Phosphatase: 46 U/L (ref 33–130)
BUN: 12 mg/dL (ref 7–25)
CO2: 26 mmol/L (ref 20–31)
Calcium: 10 mg/dL (ref 8.6–10.4)
Chloride: 101 mmol/L (ref 98–110)
Creat: 0.81 mg/dL (ref 0.60–0.93)
GFR, Est African American: 85 mL/min (ref >=60)
GFR, Est Non African American: 74 mL/min (ref >=60)
Glucose, Bld: 181 mg/dL — ABNORMAL HIGH (ref 65–99)
Potassium: 4.8 mmol/L (ref 3.5–5.3)
Sodium: 137 mmol/L (ref 135–146)
Total Bilirubin: 0.3 mg/dL (ref 0.2–1.2)
Total Protein: 6.8 g/dL (ref 6.1–8.1)

## 2016-04-12 LAB — CBC
HCT: 46.4 % — ABNORMAL HIGH (ref 35.0–45.0)
Hemoglobin: 15.1 g/dL (ref 11.7–15.5)
MCH: 29.9 pg (ref 27.0–33.0)
MCHC: 32.5 g/dL (ref 32.0–36.0)
MCV: 91.9 fL (ref 80.0–100.0)
MPV: 10.6 fL (ref 7.5–12.5)
Platelets: 316 10*3/uL (ref 140–400)
RBC: 5.05 MIL/uL (ref 3.80–5.10)
RDW: 13.8 % (ref 11.0–15.0)
WBC: 10.6 10*3/uL (ref 3.8–10.8)

## 2016-04-12 LAB — LIPASE: Lipase: 27 U/L (ref 7–60)

## 2016-04-12 MED ORDER — CIPROFLOXACIN HCL 500 MG PO TABS
500.0000 mg | ORAL_TABLET | Freq: Two times a day (BID) | ORAL | 0 refills | Status: DC
Start: 2016-04-12 — End: 2016-04-26

## 2016-04-12 MED ORDER — METRONIDAZOLE 500 MG PO TABS: 500.0000 mg | ORAL_TABLET | Freq: Three times a day (TID) | ORAL | 0 refills | Status: DC

## 2016-04-12 NOTE — Patient Instructions (Addendum)
Thank you for coming in today. Make an appointment with [facility] for later this week or early next week.  If your belly pain worsens, or you have high fever, bad vomiting, blood in your stool or black tarry stool go to the Emergency Room.    Diverticulitis Diverticulitis is inflammation or infection of small pouches in your colon that form when you have a condition called diverticulosis. The pouches in your colon are called diverticula. Your colon, or large intestine, is where water is absorbed and stool is formed. Complications of diverticulitis can include:  Bleeding.  Severe infection.  Severe pain.  Perforation of your colon.  Obstruction of your colon. What are the causes? Diverticulitis is caused by bacteria. Diverticulitis happens when stool becomes trapped in diverticula. This allows bacteria to grow in the diverticula, which can lead to inflammation and infection. What increases the risk? People with diverticulosis are at risk for diverticulitis. Eating a diet that does not include enough fiber from fruits and vegetables may make diverticulitis more likely to develop. What are the signs or symptoms? Symptoms of diverticulitis may include:  Abdominal pain and tenderness. The pain is normally located on the left side of the abdomen, but may occur in other areas.  Fever and chills.  Bloating.  Cramping.  Nausea.  Vomiting.  Constipation.  Diarrhea.  Blood in your stool. How is this diagnosed? Your health care provider will ask you about your medical history and do a physical exam. You may need to have tests done because many medical conditions can cause the same symptoms as diverticulitis. Tests may include:  Blood tests.  Urine tests.  Imaging tests of the abdomen, including X-rays and CT scans. When your condition is under control, your health care provider may recommend that you have a colonoscopy. A colonoscopy can show how severe your diverticula are and  whether something else is causing your symptoms. How is this treated? Most cases of diverticulitis are mild and can be treated at home. Treatment may include:  Taking over-the-counter pain medicines.  Following a clear liquid diet.  Taking antibiotic medicines by mouth for 7-10 days. More severe cases may be treated at a hospital. Treatment may include:  Not eating or drinking.  Taking prescription pain medicine.  Receiving antibiotic medicines through an IV tube.  Receiving fluids and nutrition through an IV tube.  Surgery. Follow these instructions at home:  Follow your health care provider's instructions carefully.  Follow a full liquid diet or other diet as directed by your health care provider. After your symptoms improve, your health care provider may tell you to change your diet. He or she may recommend you eat a high-fiber diet. Fruits and vegetables are good sources of fiber. Fiber makes it easier to pass stool.  Take fiber supplements or probiotics as directed by your health care provider.  Only take medicines as directed by your health care provider.  Keep all your follow-up appointments. Contact a health care provider if:  Your pain does not improve.  You have a hard time eating food.  Your bowel movements do not return to normal. Get help right away if:  Your pain becomes worse.  Your symptoms do not get better.  Your symptoms suddenly get worse.  You have a fever.  You have repeated vomiting.  You have bloody or black, tarry stools. This information is not intended to replace advice given to you by your health care provider. Make sure you discuss any questions you have with  your health care provider. Document Released: 02/23/2005 Document Revised: 10/22/2015 Document Reviewed: 04/10/2013 Elsevier Interactive Patient Education  2017 [employer].

## 2016-04-12 NOTE — Progress Notes (Signed)
Autumn Bowen is a 70 y.o. female who presents to [facility] [place] today for abdominal pain cramping and diarrhea. Symptoms present for about 2 weeks. She denies any fevers or chills or blood in the stool. She denies any significant vomiting. She notes that pain seems to be worse when she eats. She has not changed any regular medications recently. She feels well otherwise.   Past Medical History:  Diagnosis Date  . Anemia   . Celiac disease   . Celiac disease   . Diabetes mellitus type II   . Diverticulitis   . Dyslipidemia   . Hypothyroidism   . Sore throat    No past surgical history on file. Social History  Substance Use Topics  . Smoking status: Current Every Day Smoker    Packs/day: 0.30    Years: 50.00    Types: Cigarettes    Start date: 01/02/2012  . Smokeless tobacco: Never Used  . Alcohol use No   family history includes Anxiety disorder in her mother; Depression in her mother; Diabetes type II in her mother; Heart attack in her mother; Stroke in her mother.  ROS as above:  Medications: Current Outpatient Prescriptions  Medication Sig Dispense Refill  . ACCU-CHEK AVIVA PLUS test strip test 1-2 TIMES daily 100 each 3  . albuterol (PROVENTIL HFA;VENTOLIN HFA) 108 (90 BASE) MCG/ACT inhaler Inhale 2 puffs into the lungs every 6 (six) hours as needed.    . AMBULATORY NON FORMULARY MEDICATION Glucometer, test strips and lancets for testing once a day for DM, uncontrolled. 100 strip 0  . amitriptyline (ELAVIL) 50 MG tablet take 1/2 tablet at bedtime for one week then increase to 1 (one) tablet at bedtime 90 tablet 3  . cyclobenzaprine (FLEXERIL) 5 MG tablet Take 5 mg by mouth 2 (two) times daily as needed for muscle spasms.    . Dulaglutide (TRULICITY) 9.47 SJ/6.2EZ SOPN Inject 0.75 mg into the skin once a week. 2 mL 0  . EPIPEN 2-PAK 0.3 MG/0.3ML DEVI     .  ezetimibe-simvastatin (VYTORIN) 10-40 MG per tablet Take 1 tablet by mouth daily. 90 tablet 4  . famotidine (PEPCID) 20 MG tablet Take 1 tablet (20 mg total) by mouth daily. 90 tablet 4  . Fluticasone-Salmeterol (ADVAIR) 250-50 MCG/DOSE AEPB Inhale 2 puffs into the lungs every 12 (twelve) hours.    Marland Kitchen GLUCOSAMINE-CHONDROITIN PO Take by mouth.    . Insulin Degludec (TRESIBA FLEXTOUCH) 200 UNIT/ML SOPN Inject 10 Units into the skin at bedtime. Increase by 2 units every 5 days until fasting glucose 120. This is a 30 day supply., 4 pen 1  . levocetirizine (XYZAL) 5 MG tablet Take 5 mg by mouth every evening.    Marland Kitchen levothyroxine (SYNTHROID, LEVOTHROID) 175 MCG tablet Take 1 tablet (175 mcg total) by mouth daily. 90 tablet 3  . lisinopril (PRINIVIL,ZESTRIL) 2.5 MG tablet Take 1 tablet (2.5 mg total) by mouth daily. 30 tablet 5  . ondansetron (ZOFRAN) 8 MG tablet TAKE 1 TABLET BY MOUTH EVERY 8 HOURS AS NEEDED FOR NAUSEA AND VOMITING 20 tablet 2  . pregabalin (LYRICA) 50 MG capsule TAKE ONE CAPSULE THREE TIMES DAILY 90 capsule 5  . propranolol ER (INDERAL LA) 160 MG SR capsule Take 1 capsule (160 mg total) by mouth daily. 30 capsule 5  . QUEtiapine (SEROQUEL) 200 MG tablet Take 1 tablet (200 mg total) by mouth at bedtime. 30 tablet 2  . sertraline (  ZOLOFT) 100 MG tablet Take 2 tablets (200 mg total) by mouth daily. 60 tablet 2  . SitaGLIPtin-MetFORMIN HCl (JANUMET XR) [phone] MG TB24 Take one tablet daily. 30 tablet 5  . triamcinolone cream (KENALOG) 0.1 % Apply 1 application topically 2 (two) times daily. For itchy bumps. 60 g 1  . ciprofloxacin (CIPRO) 500 MG tablet Take 1 tablet (500 mg total) by mouth 2 (two) times daily. 20 tablet 0  . metroNIDAZOLE (FLAGYL) 500 MG tablet Take 1 tablet (500 mg total) by mouth 3 (three) times daily. 30 tablet 0   No current facility-administered medications for this visit.    Allergies  Allergen Reactions  . Atorvastatin   . Esomeprazole Magnesium   . Gluten Meal    . Ivp Dye [Iodinated Diagnostic Agents] Other (See Comments)    Per patient cardiac arrest  . Latex   . Penicillins   . Trazodone And Nefazodone Hives  . Zolpidem Tartrate Nausea And Vomiting    Health Maintenance Health Maintenance  Topic Date Due  . OPHTHALMOLOGY EXAM  12/27/2014  . HEMOGLOBIN A1C  09/23/2016  . FOOT EXAM  11/22/2016  . MAMMOGRAM  05/28/2017  . TETANUS/TDAP  06/25/2017  . COLONOSCOPY  02/25/2018  . INFLUENZA VACCINE  Addressed  . DEXA SCAN  Completed  . ZOSTAVAX  Addressed  . Hepatitis C Screening  Completed  . PNA vac Low Risk Adult  Completed     Exam:  BP 107/61   Pulse 79   Temp 97.7 F (36.5 C) (Oral)   Wt 215 lb (97.5 kg)   LMP 09/21/1974   BMI 38.70 kg/m  Gen: Well NAD HEENT: EOMI,  MMM Lungs: Normal work of breathing. CTABL Heart: RRR no MRG Abd: NABS, Soft. Distended and diffusely tender without rebound or guarding.  Exts: Brisk capillary refill, warm and well perfused.    No results found for this or any previous visit (from the past 72 hour(s)). Ct Abdomen Pelvis Wo Contrast  Result Date: 04/12/2016 CLINICAL DATA:  Abdominal pain and distention since 03/26/2016. EXAM: CT ABDOMEN AND PELVIS WITHOUT CONTRAST TECHNIQUE: Multidetector CT imaging of the abdomen and pelvis was performed following the standard protocol without IV contrast. COMPARISON:  None. FINDINGS: Lower chest: Lung bases are clear. No pleural or pericardial effusion. Calcific coronary artery disease is seen. Hepatobiliary: No focal liver abnormality is seen. No gallstones, gallbladder wall thickening, or biliary dilatation. Pancreas: Unremarkable. No pancreatic ductal dilatation or surrounding inflammatory changes. Spleen: Normal in size without focal abnormality. Adrenals/Urinary Tract: Adrenal glands are unremarkable. Kidneys are normal, without renal calculi, focal lesion, or hydronephrosis. Bladder is unremarkable. Stomach/Bowel: Diverticulosis appears worst in the  sigmoid. Very mild stranding is seen about the mid sigmoid colon. No abscess or perforation. The stomach and small bowel appear normal. The appendix is not visualized but there is no evidence of appendicitis. Vascular/Lymphatic: Aortoiliac atherosclerosis without aneurysm is identified. Reproductive: Status post hysterectomy. Other: No fluid collection. Musculoskeletal: No fracture or worrisome lesion. The patient is status post lower lumbar laminectomy. Facet degenerative change results in grade 1 anterolisthesis L4 on L5. IMPRESSION: Diverticulosis appearing worst in the sigmoid. Mild stranding about the mid sigmoid colon is compatible with diverticulitis. No abscess or perforation. Calcific aortic and coronary atherosclerosis. Electronically Signed   By: Inge Rise M.D.   On: 04/12/2016 10:41      Assessment and Plan: 70 y.o. female with diverticulitis likely. Patient is able to eat and drink and is afebrile. Plan for laboratory workup listed  below and empiric treatment with Cipro and Flagyl. Follow-up with PCP or myself in a few days. Return sooner if needed.   Orders Placed This Encounter  Procedures  . CT Abdomen Pelvis Wo Contrast    Standing Status:   Future    Number of Occurrences:   1    Standing Expiration Date:   07/13/2017    Order Specific Question:   Reason for Exam (SYMPTOM  OR DIAGNOSIS REQUIRED)    Answer:   eval pain and distension. Concern partial SBO. Pt allergic to contrast    Order Specific Question:   Preferred imaging location?    Answer:   [facility]  . CBC  . COMPLETE METABOLIC PANEL WITH GFR  . Lipase    Discussed warning signs or symptoms. Please see discharge instructions. Patient expresses understanding.  I spent 40 minutes with this patient, greater than 50% was face-to-face time counseling regarding the above diagnosis.

## 2016-04-15 ENCOUNTER — Other Ambulatory Visit (HOSPITAL_COMMUNITY): Payer: Self-pay | Admitting: Physician Assistant

## 2016-04-19 ENCOUNTER — Ambulatory Visit (INDEPENDENT_AMBULATORY_CARE_PROVIDER_SITE_OTHER): Payer: Commercial Managed Care - HMO | Admitting: Physician Assistant

## 2016-04-19 ENCOUNTER — Encounter: Payer: Self-pay | Admitting: Physician Assistant

## 2016-04-19 VITALS — BP 131/67 | HR 80 | Ht 62.5 in | Wt 214.0 lb

## 2016-04-19 DIAGNOSIS — R110 Nausea: Secondary | ICD-10-CM | POA: Diagnosis not present

## 2016-04-19 DIAGNOSIS — B379 Candidiasis, unspecified: Secondary | ICD-10-CM

## 2016-04-19 DIAGNOSIS — R197 Diarrhea, unspecified: Secondary | ICD-10-CM

## 2016-04-19 DIAGNOSIS — K5732 Diverticulitis of large intestine without perforation or abscess without bleeding: Secondary | ICD-10-CM

## 2016-04-19 MED ORDER — FLUCONAZOLE 150 MG PO TABS: 150.0000 mg | ORAL_TABLET | Freq: Once | ORAL | 0 refills | Status: AC

## 2016-04-19 MED ORDER — PROMETHAZINE HCL 25 MG PO TABS: 25.0000 mg | ORAL_TABLET | Freq: Four times a day (QID) | ORAL | 0 refills | Status: DC | PRN

## 2016-04-19 NOTE — Progress Notes (Addendum)
   Subjective:    Patient ID: Autumn Bowen, female    DOB: 07-Sep-1945, 70 y.o.   MRN: [id number]  HPI  Patient is a 70 yo female coming to the office for a one week follow-up for diverticulitis. Patient is being treated with metronidazole and cipro. Today the patient is still complaining of abdominal pain and bloating although much better than 1 week ago. Patient rates her abdominal pain as a 6 out of 10. Patient is also still nauseous and having diarrhea 5-6 times a day. Patient says that her diarrhea looks like there are worms in it. Patient denies blood in stool.   Patient does not have much of an appetite but has been trying to eat saline crackers and drink plenty of water.   Patient also complains of her urine looking rusty. Patient denies urinary frequency or urgency. Patient does complain of vaginal itching and irritation after taking antibiotics.   Review of Systems See HPI    Objective:   Physical Exam  Constitutional: She appears well-developed and well-nourished.  Cardiovascular: Normal rate and regular rhythm.  Exam reveals no gallop and no friction rub.   No murmur heard. Pulmonary/Chest: Effort normal and breath sounds normal. No respiratory distress. She has no wheezes. She has no rales. She exhibits no tenderness.  Abdominal: She exhibits distension. There is tenderness (Patient is tender in all four quadrants. ). There is no rebound and no guarding.  No rebound or guarding.           Assessment & Plan:   Diagnoses and all orders for this visit:  Diarrhea, unspecified type -     Clostridium difficile culture-fecal -     Stool Culture -     Ova and parasite examination -     CBC with Differential/Platelet -     COMPLETE METABOLIC PANEL WITH GFR  Diverticulitis of colon -     Clostridium difficile culture-fecal -     Stool Culture -     Ova and parasite examination -     CBC with Differential/Platelet -     COMPLETE METABOLIC PANEL WITH GFR  Yeast  infection -     fluconazole (DIFLUCAN) 150 MG tablet; Take 1 tablet (150 mg total) by mouth once. Repeat in 48-72.  Nausea without vomiting -     promethazine (PHENERGAN) 25 MG tablet; Take 1 tablet (25 mg total) by mouth every 6 (six) hours as needed for nausea or vomiting.   Since patient is still having diarrhea and is taking metronidazole and cipro, patient was sent to get a stool culture for C. Diff and ova and parasite examination. Re-check CBC due to patient still having a 6 or 7 out of 10 pain and rebound tenderness on exam. Also get CMP to check kidney and liver function. Follow-up with patient in one week or sooner if symptoms worsen. I do not think there is any indication for repeat CT scan. Call the office if symptoms worsen.   Start fluconazole for yeast infection, clinical diagnosis due to vaginal itching and irritation after taking antibiotics.   Start taking phenergan for nausea.

## 2016-04-20 ENCOUNTER — Encounter: Payer: Self-pay | Admitting: Physician Assistant

## 2016-04-20 LAB — CBC WITH DIFFERENTIAL/PLATELET
Basophils Absolute: 121 cells/uL (ref 0–200)
Basophils Relative: 1 %
Eosinophils Absolute: 363 cells/uL (ref 15–500)
Eosinophils Relative: 3 %
HCT: 46.8 % — ABNORMAL HIGH (ref 35.0–45.0)
Hemoglobin: 15.1 g/dL (ref 11.7–15.5)
Lymphocytes Relative: 35 %
Lymphs Abs: 4235 cells/uL — ABNORMAL HIGH (ref 850–3900)
MCH: 29.3 pg (ref 27.0–33.0)
MCHC: 32.3 g/dL (ref 32.0–36.0)
MCV: 90.9 fL (ref 80.0–100.0)
MPV: 10.4 fL (ref 7.5–12.5)
Monocytes Absolute: 1452 cells/uL — ABNORMAL HIGH (ref 200–950)
Monocytes Relative: 12 %
Neutro Abs: 5929 cells/uL (ref 1500–7800)
Neutrophils Relative %: 49 %
Platelets: 298 10*3/uL (ref 140–400)
RBC: 5.15 MIL/uL — ABNORMAL HIGH (ref 3.80–5.10)
RDW: 13.8 % (ref 11.0–15.0)
WBC: 12.1 10*3/uL — ABNORMAL HIGH (ref 3.8–10.8)

## 2016-04-20 LAB — COMPLETE METABOLIC PANEL WITH GFR
ALT: 21 U/L (ref 6–29)
AST: 31 U/L (ref 10–35)
Albumin: 4.3 g/dL (ref 3.6–5.1)
Alkaline Phosphatase: 46 U/L (ref 33–130)
BUN: 9 mg/dL (ref 7–25)
CO2: 29 mmol/L (ref 20–31)
Calcium: 9.5 mg/dL (ref 8.6–10.4)
Chloride: 101 mmol/L (ref 98–110)
Creat: 0.78 mg/dL (ref 0.60–0.93)
GFR, Est African American: 89 mL/min (ref >=60)
GFR, Est Non African American: 77 mL/min (ref >=60)
Glucose, Bld: 137 mg/dL — ABNORMAL HIGH (ref 65–99)
Potassium: 4.5 mmol/L (ref 3.5–5.3)
Sodium: 138 mmol/L (ref 135–146)
Total Bilirubin: 0.4 mg/dL (ref 0.2–1.2)
Total Protein: 7.1 g/dL (ref 6.1–8.1)

## 2016-04-20 NOTE — Progress Notes (Signed)
Call pt: WBC up a bit. I would like for you to stop cipro. Stay on metronidazole 525m three times a day for 10 days #30 NRF(amber will you send new rx to pharmacy) I will send you over refill at pharmacy. We will wait to get stool cultures back.  If abdominal pain worsens, spikes a fever please go to ED.  Please make appt for follow up on Monday after thanksgiving.

## 2016-04-25 DIAGNOSIS — R197 Diarrhea, unspecified: Secondary | ICD-10-CM | POA: Diagnosis not present

## 2016-04-25 DIAGNOSIS — K5732 Diverticulitis of large intestine without perforation or abscess without bleeding: Secondary | ICD-10-CM | POA: Diagnosis not present

## 2016-04-26 ENCOUNTER — Encounter: Payer: Self-pay | Admitting: Physician Assistant

## 2016-04-26 ENCOUNTER — Ambulatory Visit (INDEPENDENT_AMBULATORY_CARE_PROVIDER_SITE_OTHER): Payer: Commercial Managed Care - HMO | Admitting: Physician Assistant

## 2016-04-26 VITALS — BP 126/60 | HR 91 | Ht 62.5 in | Wt 214.0 lb

## 2016-04-26 DIAGNOSIS — R1084 Generalized abdominal pain: Secondary | ICD-10-CM

## 2016-04-26 DIAGNOSIS — K900 Celiac disease: Secondary | ICD-10-CM

## 2016-04-26 DIAGNOSIS — Z780 Asymptomatic menopausal state: Secondary | ICD-10-CM | POA: Diagnosis not present

## 2016-04-26 DIAGNOSIS — R3989 Other symptoms and signs involving the genitourinary system: Secondary | ICD-10-CM | POA: Diagnosis not present

## 2016-04-26 DIAGNOSIS — Z13820 Encounter for screening for osteoporosis: Secondary | ICD-10-CM

## 2016-04-26 DIAGNOSIS — R197 Diarrhea, unspecified: Secondary | ICD-10-CM

## 2016-04-26 DIAGNOSIS — D72829 Elevated white blood cell count, unspecified: Secondary | ICD-10-CM

## 2016-04-26 DIAGNOSIS — Z1231 Encounter for screening mammogram for malignant neoplasm of breast: Secondary | ICD-10-CM | POA: Diagnosis not present

## 2016-04-26 MED ORDER — FLUCONAZOLE 150 MG PO TABS: 150.0000 mg | ORAL_TABLET | Freq: Once | ORAL | 0 refills | Status: AC

## 2016-04-26 NOTE — Progress Notes (Addendum)
   Subjective:    Patient ID: Autumn Bowen, female    DOB: 12-31-1945, 70 y.o.   MRN: [id number]  HPI Patient is a 70 yo female coming for a two week follow-up after diverticulitis. Patient reports that she is still having diarrhea about 6-8 times a day. Patient denies blood in stool. She reports that she still feels bloated and will have sharp pains in her stomach about two times a day. She rates the pains a 7/10.   Patient is also complaining of urine being "rusty" and dysuria. Patient also has been experiencing vaginal itching. Patient states that she did not pick up the diflucan that was ordered a week ago at the pharmacy. Patient denies vaginal discharge.    Review of Systems  All other systems reviewed and are negative.  See HPI      Objective:   Physical Exam  Constitutional: She appears well-developed and well-nourished.  Cardiovascular: Normal rate and regular rhythm.  Exam reveals no gallop and no friction rub.   No murmur heard. Pulmonary/Chest: Effort normal and breath sounds normal. No respiratory distress. She has no wheezes. She has no rales.  Abdominal: Bowel sounds are normal. She exhibits distension. There is tenderness. There is rebound (Patient is extremely tender to palpation in all quadrants. Patient  attempts to move away when  abdomen is palpated due to pain.) and guarding.          Assessment & Plan:  Diagnoses and all orders for this visit:  1. Osteoporosis screening -     Scheduled DG Bone Density due to patient being over 69 yo and post menopausal.   2. Visit for screening mammogram -     MM DIGITAL SCREENING BILATERAL for health maintenance.   3. Leukocytosis, unspecified type -     CBC with Differential/Platelet to re-evaluate WBCs -     Ambulatory referral to Gastroenterology due to unspecified leukocytosis plus abdominal pain for two weeks that has not improved.  4. Generalized abdominal pain -     Ambulatory referral to Gastroenterology  due to pain not improving despite treatment of diverticulitis two weeks ago. There was a suspicion of C.diff. Started oral flagyl to treat before thanksgiving but pt did not start. She has returned stool cultures yesterday which are pending.    5. Diarrhea, unspecified type -     Ambulatory referral to Gastroenterology -Stool culture is still pending.   6. Celiac disease -   Ambulatory referral to Gastroenterology due to abdominal pain and known history of celiac disease. Patient is not yet established with a gastroenterologist.   7. Vaginal Yeast Infection -     fluconazole (DIFLUCAN) 150 MG tablet; Take 1 tablet (150 mg total) by mouth once. Repeat in 48 hours if symptoms persist for vaginal yeast infection.  8. Abnormal urine color. UA negative for blood, leuks, nitrates. Reassurance given.

## 2016-04-27 ENCOUNTER — Other Ambulatory Visit (HOSPITAL_COMMUNITY): Payer: Self-pay | Admitting: Psychiatry

## 2016-04-27 LAB — POCT URINALYSIS DIPSTICK
Bilirubin, UA: NEGATIVE
Blood, UA: NEGATIVE
Glucose, UA: NEGATIVE
Ketones, UA: NEGATIVE
Leukocytes, UA: NEGATIVE
Nitrite, UA: NEGATIVE
Protein, UA: NEGATIVE
Spec Grav, UA: <=1.005
Urobilinogen, UA: 0.2
pH, UA: 5.5

## 2016-04-27 LAB — CBC WITH DIFFERENTIAL/PLATELET
Basophils Absolute: 0 cells/uL (ref 0–200)
Basophils Relative: 0 %
Eosinophils Absolute: 306 cells/uL (ref 15–500)
Eosinophils Relative: 3 %
HCT: 45.9 % — ABNORMAL HIGH (ref 35.0–45.0)
Hemoglobin: 15 g/dL (ref 11.7–15.5)
Lymphocytes Relative: 34 %
Lymphs Abs: 3468 cells/uL (ref 850–3900)
MCH: 29.6 pg (ref 27.0–33.0)
MCHC: 32.7 g/dL (ref 32.0–36.0)
MCV: 90.7 fL (ref 80.0–100.0)
MPV: 10.4 fL (ref 7.5–12.5)
Monocytes Absolute: 1020 cells/uL — ABNORMAL HIGH (ref 200–950)
Monocytes Relative: 10 %
Neutro Abs: 5406 cells/uL (ref 1500–7800)
Neutrophils Relative %: 53 %
Platelets: 344 10*3/uL (ref 140–400)
RBC: 5.06 MIL/uL (ref 3.80–5.10)
RDW: 13.8 % (ref 11.0–15.0)
WBC: 10.2 10*3/uL (ref 3.8–10.8)

## 2016-04-27 NOTE — Telephone Encounter (Signed)
Received fax from [facility] requesting a refill for Zoloft. Per Dr. De Nurse, refill request is denied. Medication refill was last filled on 01/07/16 for Zoloft 150m, #60 w/ 2 refills. Pt no show appt on 03/01/16. lvm for pt to contact office to schedule an apt.

## 2016-04-27 NOTE — Addendum Note (Signed)
Addended by: Beatris Ship L on: 04/27/2016 10:57 AM   Modules accepted: Orders

## 2016-04-27 NOTE — Progress Notes (Signed)
WBC is trending back down this is reassuring. Someone should call you today with GI appt.

## 2016-04-28 DIAGNOSIS — R197 Diarrhea, unspecified: Secondary | ICD-10-CM | POA: Diagnosis not present

## 2016-04-28 DIAGNOSIS — R933 Abnormal findings on diagnostic imaging of other parts of digestive tract: Secondary | ICD-10-CM | POA: Diagnosis not present

## 2016-04-28 DIAGNOSIS — R1032 Left lower quadrant pain: Secondary | ICD-10-CM | POA: Diagnosis not present

## 2016-04-29 LAB — STOOL CULTURE

## 2016-04-29 NOTE — Progress Notes (Signed)
She should have appt with GI. I sent over 10 days that she said earlier this week that she had not taken so she should have plenty.

## 2016-04-30 LAB — CLOSTRIDIUM DIFFICILE CULTURE-FECAL

## 2016-05-02 ENCOUNTER — Ambulatory Visit: Payer: Self-pay | Admitting: Physician Assistant

## 2016-05-06 DIAGNOSIS — R1032 Left lower quadrant pain: Secondary | ICD-10-CM | POA: Diagnosis not present

## 2016-05-11 ENCOUNTER — Other Ambulatory Visit: Payer: Self-pay | Admitting: Physician Assistant

## 2016-05-12 DIAGNOSIS — K5792 Diverticulitis of intestine, part unspecified, without perforation or abscess without bleeding: Secondary | ICD-10-CM | POA: Diagnosis not present

## 2016-05-12 DIAGNOSIS — R1032 Left lower quadrant pain: Secondary | ICD-10-CM | POA: Diagnosis not present

## 2016-05-12 DIAGNOSIS — R197 Diarrhea, unspecified: Secondary | ICD-10-CM | POA: Diagnosis not present

## 2016-05-12 DIAGNOSIS — R1031 Right lower quadrant pain: Secondary | ICD-10-CM | POA: Diagnosis not present

## 2016-05-24 ENCOUNTER — Ambulatory Visit (INDEPENDENT_AMBULATORY_CARE_PROVIDER_SITE_OTHER): Payer: Commercial Managed Care - HMO

## 2016-05-24 ENCOUNTER — Encounter: Payer: Self-pay | Admitting: Physician Assistant

## 2016-05-24 DIAGNOSIS — Z1231 Encounter for screening mammogram for malignant neoplasm of breast: Secondary | ICD-10-CM | POA: Diagnosis not present

## 2016-05-24 DIAGNOSIS — M8580 Other specified disorders of bone density and structure, unspecified site: Secondary | ICD-10-CM | POA: Insufficient documentation

## 2016-05-24 DIAGNOSIS — M85851 Other specified disorders of bone density and structure, right thigh: Secondary | ICD-10-CM | POA: Diagnosis not present

## 2016-05-31 ENCOUNTER — Other Ambulatory Visit: Payer: Self-pay

## 2016-05-31 ENCOUNTER — Ambulatory Visit: Payer: Self-pay

## 2016-06-01 DIAGNOSIS — R69 Illness, unspecified: Secondary | ICD-10-CM | POA: Diagnosis not present

## 2016-06-01 DIAGNOSIS — K5792 Diverticulitis of intestine, part unspecified, without perforation or abscess without bleeding: Secondary | ICD-10-CM | POA: Diagnosis not present

## 2016-06-01 DIAGNOSIS — D123 Benign neoplasm of transverse colon: Secondary | ICD-10-CM | POA: Diagnosis not present

## 2016-06-01 LAB — HM COLONOSCOPY

## 2016-06-04 ENCOUNTER — Other Ambulatory Visit (HOSPITAL_COMMUNITY): Payer: Self-pay | Admitting: Physician Assistant

## 2016-06-16 ENCOUNTER — Encounter: Payer: Self-pay | Admitting: Physician Assistant

## 2016-06-17 ENCOUNTER — Other Ambulatory Visit (HOSPITAL_COMMUNITY): Payer: Self-pay | Admitting: Physician Assistant

## 2016-06-22 ENCOUNTER — Encounter: Payer: Self-pay | Admitting: Physician Assistant

## 2016-06-22 ENCOUNTER — Ambulatory Visit: Payer: Commercial Managed Care - HMO | Admitting: Physician Assistant

## 2016-06-22 ENCOUNTER — Ambulatory Visit (INDEPENDENT_AMBULATORY_CARE_PROVIDER_SITE_OTHER): Payer: Commercial Managed Care - HMO | Admitting: Physician Assistant

## 2016-06-22 VITALS — BP 113/71 | HR 82 | Wt 210.0 lb

## 2016-06-22 VITALS — BP 113/71 | HR 82 | Temp 97.7°F | Resp 18 | Ht 63.0 in | Wt 210.0 lb

## 2016-06-22 DIAGNOSIS — Z0000 Encounter for general adult medical examination without abnormal findings: Secondary | ICD-10-CM

## 2016-06-22 DIAGNOSIS — Z23 Encounter for immunization: Secondary | ICD-10-CM

## 2016-06-22 DIAGNOSIS — E118 Type 2 diabetes mellitus with unspecified complications: Secondary | ICD-10-CM | POA: Diagnosis not present

## 2016-06-22 DIAGNOSIS — Z0001 Encounter for general adult medical examination with abnormal findings: Secondary | ICD-10-CM | POA: Diagnosis not present

## 2016-06-22 DIAGNOSIS — Z599 Problem related to housing and economic circumstances, unspecified: Secondary | ICD-10-CM

## 2016-06-22 DIAGNOSIS — Z598 Other problems related to housing and economic circumstances: Secondary | ICD-10-CM

## 2016-06-22 LAB — POCT GLYCOSYLATED HEMOGLOBIN (HGB A1C): Hemoglobin A1C: 7.3

## 2016-06-22 MED ORDER — INSULIN DEGLUDEC 200 UNIT/ML ~~LOC~~ SOPN: 16.0000 [IU] | PEN_INJECTOR | Freq: Every day | SUBCUTANEOUS | 2 refills | Status: DC

## 2016-06-22 MED ORDER — DULAGLUTIDE 0.75 MG/0.5ML ~~LOC~~ SOAJ: SUBCUTANEOUS | 2 refills | Status: DC

## 2016-06-22 MED ORDER — SITAGLIP PHOS-METFORMIN HCL ER 100-1000 MG PO TB24: ORAL_TABLET | ORAL | 5 refills | Status: DC

## 2016-06-22 NOTE — Progress Notes (Signed)
   Subjective:    Patient ID: Autumn Bowen, female    DOB: [birth year], 71 y.o.   MRN: [id number]  HPI  Pt is a 71 yo female who presents to the clinic for DM follow up.   She is checking sugars and running 120's to 140's in the am. She denies any hypoglycemic events. She continues to take tresbia 14-16 units at bedtime. She has trulicity but still has some problems with pens. She stated 2 pens were "defected". She takes janumet daily. She has not had an eye exam within the year. No open wounds or sore on legs.     Review of Systems  All other systems reviewed and are negative.      Objective:   Physical Exam  Constitutional: She is oriented to person, place, and time. She appears well-developed and well-nourished.  HENT:  Head: Normocephalic and atraumatic.  Cardiovascular: Normal rate, regular rhythm and normal heart sounds.   Pulmonary/Chest: Effort normal and breath sounds normal.  Neurological: She is alert and oriented to person, place, and time.  Psychiatric: She has a normal mood and affect. Her behavior is normal.          Assessment & Plan:  .Marland KitchenDeniss was seen today for diabetes.  Diagnoses and all orders for this visit:  Type 2 diabetes mellitus with complication, without long-term current use of insulin (HCC) -     POCT HgB A1C -     SitaGLIPtin-MetFORMIN HCl (JANUMET XR) [phone] MG TB24; Take one tablet daily.  Other orders -     Dulaglutide (TRULICITY) 1.75 ZW/2.5EN SOPN; Inject 0.75 mg into the skin once a week. -     Insulin Degludec (TRESIBA FLEXTOUCH) 200 UNIT/ML SOPN; Inject 16 Units into the skin at bedtime. Increase by 2 units every 5 days until fasting glucose 120. This is a 30 day supply.,   A!C down from 7.8 to 7.3.  Discussed annual eye exam and to discuss with resource nurse at [facility] exam.  Discussed again how to use trulicity pen and pt showed me on demonstrater device.  Discussed tapering up on insulin to get fasting sugars 100  to 120.

## 2016-06-22 NOTE — Progress Notes (Signed)
Subjective:   Autumn Bowen is a 71 y.o. female who presents for Medicare Annual (Subsequent) preventive examination.  Review of Systems:   Cardiac Risk Factors include: advanced age (>29mn, >>9women);diabetes mellitus;dyslipidemia;hypertension;obesity (BMI >30kg/m2);sedentary lifestyle;smoking/ tobacco exposure     Objective:     Vitals: BP 113/71   Pulse 82   Temp 97.7 F (36.5 C) (Oral)   Resp 18   Ht 5' 3"  (1.6 m)   Wt 210 lb (95.3 kg)   LMP 09/21/1974   SpO2 95%   BMI 37.20 kg/m   Body mass index is 37.2 kg/m.   Tobacco History  Smoking Status  . Current Every Day Smoker  . Packs/day: 0.50  . Years: 51.00  . Types: Cigarettes  . Start date: 01/02/2012  Smokeless Tobacco  . Never Used     Ready to quit: No Counseling given: No   Past Medical History:  Diagnosis Date  . Allergy    Notes that grass and some trees causes eyes to sting, burn, and water.  . Anemia   . Celiac disease   . Celiac disease   . COPD (chronic obstructive pulmonary disease) ([place]   . Diabetes mellitus type II   . Diverticulitis   . Dyslipidemia   . Hypothyroidism   . Sore throat    Past Surgical History:  Procedure Laterality Date  . APPENDECTOMY    . BREAST SURGERY     h/o benign cystleft breast and lymp node removal on right breast area.  .Marland KitchenEYE SURGERY     Left eye cataract removal, right eye h/o macular degeneration.  .Marland KitchenSPINE SURGERY     Pt not sure of the type of surgery she had but knows it was L4-5.  .Marland KitchenVAGINAL HYSTERECTOMY     Family History  Problem Relation Age of Onset  . Anxiety disorder Mother   . Depression Mother   . Heart attack Mother   . Stroke Mother   . Diabetes type II Mother   . Diabetes Father   . Glaucoma Father   . Hypertension Father   . COPD Father   . COPD Sister   . Hashimoto's thyroiditis Sister   . Cancer Sister   . Kidney disease Sister   . Drug abuse Brother   . Alcohol abuse Brother   . Asthma Son   . Diabetes Son     History  Sexual Activity  . Sexual activity: Not Currently    Outpatient Encounter Prescriptions as of 06/22/2016  Medication Sig  . ACCU-CHEK AVIVA PLUS test strip test 1-2 TIMES daily  . albuterol (PROVENTIL HFA;VENTOLIN HFA) 108 (90 BASE) MCG/ACT inhaler Inhale 2 puffs into the lungs every 6 (six) hours as needed.  . AMBULATORY NON FORMULARY MEDICATION Glucometer, test strips and lancets for testing once a day for DM, uncontrolled.  .Marland Kitchenamitriptyline (ELAVIL) 50 MG tablet take 1/2 tablet at bedtime for one week then increase to 1 (one) tablet at bedtime  . cyclobenzaprine (FLEXERIL) 5 MG tablet Take 5 mg by mouth 2 (two) times daily as needed for muscle spasms.  .Marland Kitchendicyclomine (BENTYL) 10 MG capsule Take 1 capsule by mouth three times a day as needed  . Dulaglutide (TRULICITY) 08.36MOQ/9.4TMSOPN Inject 0.75 mg into the skin once a week.  . ezetimibe-simvastatin (VYTORIN) 10-40 MG tablet Take 1 tablet by mouth daily.  . famotidine (PEPCID) 20 MG tablet Take 1 tablet (20 mg total) by mouth daily.  . Fluticasone-Salmeterol (ADVAIR) 250-50 MCG/DOSE  AEPB Inhale 2 puffs into the lungs every 12 (twelve) hours.  Marland Kitchen GLUCOSAMINE-CHONDROITIN PO Take by mouth.  . Insulin Degludec (TRESIBA FLEXTOUCH) 200 UNIT/ML SOPN Inject 16 Units into the skin at bedtime. Increase by 2 units every 5 days until fasting glucose 120. This is a 30 day supply.,  . levocetirizine (XYZAL) 5 MG tablet Take 5 mg by mouth every evening.  Marland Kitchen levothyroxine (SYNTHROID, LEVOTHROID) 175 MCG tablet Take 1 tablet (175 mcg total) by mouth daily.  Marland Kitchen lisinopril (PRINIVIL,ZESTRIL) 2.5 MG tablet Take 1 tablet (2.5 mg total) by mouth daily.  Marland Kitchen NAPROXEN SODIUM PO Take 220 mg by mouth 2 (two) times daily as needed.  . ondansetron (ZOFRAN) 8 MG tablet TAKE 1 TABLET BY MOUTH EVERY 8 HOURS AS NEEDED FOR NAUSEA AND VOMITING  . pregabalin (LYRICA) 50 MG capsule TAKE ONE CAPSULE THREE TIMES DAILY  . promethazine (PHENERGAN) 25 MG tablet Take 1  tablet (25 mg total) by mouth every 6 (six) hours as needed for nausea or vomiting.  . propranolol ER (INDERAL LA) 160 MG SR capsule Take 1 capsule (160 mg total) by mouth daily.  . QUEtiapine (SEROQUEL) 200 MG tablet Take 1 tablet (200 mg total) by mouth at bedtime.  . sertraline (ZOLOFT) 100 MG tablet Take 2 tablets (200 mg total) by mouth daily.  . SitaGLIPtin-MetFORMIN HCl (JANUMET XR) [phone] MG TB24 Take one tablet daily.  Marland Kitchen triamcinolone cream (KENALOG) 0.1 % Apply 1 application topically 2 (two) times daily. For itchy bumps.  . EPIPEN 2-PAK 0.3 MG/0.3ML DEVI   . metroNIDAZOLE (FLAGYL) 500 MG tablet Take 1 tablet (500 mg total) by mouth 3 (three) times daily. (Patient not taking: Reported on 06/22/2016)   No facility-administered encounter medications on file as of 06/22/2016.     Activities of Daily Living In your present state of health, do you have any difficulty performing the following activities: 06/22/2016  Hearing? N  Vision? Y  Difficulty concentrating or making decisions? Y  Walking or climbing stairs? Y  Dressing or bathing? N  Doing errands, shopping? N  Preparing Food and eating ? N  Using the Toilet? N  In the past six months, have you accidently leaked urine? Y  Do you have problems with loss of bowel control? Y  Managing your Medications? N  Managing your Finances? N  Housekeeping or managing your Housekeeping? N  Some recent data might be hidden    Patient Care Team: Donella Stade, PA-C as PCP - General (Family Medicine)    Assessment:     Exercise Activities and Dietary recommendations Current Exercise Habits: Home exercise routine (Reports that she walks her dog qd.), Type of exercise: walking, Time (Minutes): 30, Frequency (Times/Week): 7, Weekly Exercise (Minutes/Week): 210, Intensity: Mild, Exercise limited by: respiratory conditions(s);orthopedic condition(s)  Goals      Patient Stated   . Prevent Falls (pt-stated)          She would like to  see if she can obtain assistance to have her knees, which are weak and unsteady, looked at.  She states she needs assistance with this.      Fall Risk Fall Risk  06/22/2016  Falls in the past year? Yes  Number falls in past yr: 2 or more  Injury with Fall? Yes  Risk Factor Category  High Fall Risk  Risk for fall due to : History of fall(s);Impaired mobility;Impaired vision;Impaired balance/gait  Follow up Falls prevention discussed;Education provided   Depression Screen PHQ 2/9 Scores 06/22/2016  PHQ - 2 Score 4  PHQ- 9 Score 15     Cognitive Function     6CIT Screen 06/22/2016  What Year? 0 points  What month? 0 points  What time? 3 points  Count back from 20 0 points  Months in reverse 0 points  Repeat phrase 0 points  Total Score 3    Immunization History  Administered Date(s) Administered  . Influenza Whole 05/27/2009  . Influenza,inj,Quad PF,36+ Mos 05/01/2015, 03/25/2016  . Pneumococcal Conjugate-13 06/22/2016  . Pneumococcal Polysaccharide-23 05/01/2015  . Td 08/05/2004, 06/26/2007   Screening Tests Health Maintenance  Topic Date Due  . OPHTHALMOLOGY EXAM  12/27/2014  . FOOT EXAM  11/22/2016  . HEMOGLOBIN A1C  12/20/2016  . TETANUS/TDAP  06/25/2017  . MAMMOGRAM  05/24/2018  . COLONOSCOPY  06/01/2021  . INFLUENZA VACCINE  Addressed  . DEXA SCAN  Completed  . ZOSTAVAX  Addressed  . Hepatitis C Screening  Completed  . PNA vac Low Risk Adult  Completed      Plan:    During the course of the visit the patient was educated and counseled about the following appropriate screening and preventive services:   Vaccines to include Pneumoccal, Influenza, Hepatitis B, Td, Zostavax, HCV  Electrocardiogram  Cardiovascular Disease  Colorectal cancer screening  Bone density screening  Diabetes screening  Glaucoma screening  Mammography/PAP  Nutrition counseling   Patient Instructions (the written plan) was given to the patient.   Nestor Lewandowsky, RN  06/22/2016   I have reviewed chart.  Goal is to get patient DM eye exam and treatment for cracked tooth. Nursing staff is working to find resources.

## 2016-06-24 ENCOUNTER — Ambulatory Visit: Payer: Self-pay | Admitting: Physician Assistant

## 2016-07-21 DIAGNOSIS — Z888 Allergy status to other drugs, medicaments and biological substances status: Secondary | ICD-10-CM | POA: Diagnosis not present

## 2016-07-21 DIAGNOSIS — R1031 Right lower quadrant pain: Secondary | ICD-10-CM | POA: Diagnosis not present

## 2016-07-21 DIAGNOSIS — Z91030 Bee allergy status: Secondary | ICD-10-CM | POA: Diagnosis not present

## 2016-07-21 DIAGNOSIS — Z7951 Long term (current) use of inhaled steroids: Secondary | ICD-10-CM | POA: Diagnosis not present

## 2016-07-21 DIAGNOSIS — Z91041 Radiographic dye allergy status: Secondary | ICD-10-CM | POA: Diagnosis not present

## 2016-07-21 DIAGNOSIS — Z7984 Long term (current) use of oral hypoglycemic drugs: Secondary | ICD-10-CM | POA: Diagnosis not present

## 2016-07-21 DIAGNOSIS — I10 Essential (primary) hypertension: Secondary | ICD-10-CM | POA: Diagnosis not present

## 2016-07-21 DIAGNOSIS — Z880 Allergy status to penicillin: Secondary | ICD-10-CM | POA: Diagnosis not present

## 2016-07-21 DIAGNOSIS — Z79899 Other long term (current) drug therapy: Secondary | ICD-10-CM | POA: Diagnosis not present

## 2016-07-21 DIAGNOSIS — Z91018 Allergy to other foods: Secondary | ICD-10-CM | POA: Diagnosis not present

## 2016-07-21 DIAGNOSIS — B372 Candidiasis of skin and nail: Secondary | ICD-10-CM | POA: Diagnosis not present

## 2016-07-21 DIAGNOSIS — E119 Type 2 diabetes mellitus without complications: Secondary | ICD-10-CM | POA: Diagnosis not present

## 2016-08-01 ENCOUNTER — Other Ambulatory Visit (HOSPITAL_COMMUNITY): Payer: Self-pay | Admitting: Physician Assistant

## 2016-08-23 ENCOUNTER — Other Ambulatory Visit: Payer: Self-pay | Admitting: Physician Assistant

## 2016-09-14 ENCOUNTER — Other Ambulatory Visit (HOSPITAL_COMMUNITY): Payer: Self-pay | Admitting: Physician Assistant

## 2016-09-20 ENCOUNTER — Encounter: Payer: Self-pay | Admitting: Physician Assistant

## 2016-09-20 ENCOUNTER — Ambulatory Visit (INDEPENDENT_AMBULATORY_CARE_PROVIDER_SITE_OTHER): Payer: Medicare HMO | Admitting: Physician Assistant

## 2016-09-20 VITALS — BP 84/52 | HR 85 | Ht 63.0 in | Wt 207.0 lb

## 2016-09-20 DIAGNOSIS — E1169 Type 2 diabetes mellitus with other specified complication: Secondary | ICD-10-CM

## 2016-09-20 DIAGNOSIS — E118 Type 2 diabetes mellitus with unspecified complications: Secondary | ICD-10-CM

## 2016-09-20 DIAGNOSIS — I959 Hypotension, unspecified: Secondary | ICD-10-CM

## 2016-09-20 DIAGNOSIS — Z79899 Other long term (current) drug therapy: Secondary | ICD-10-CM

## 2016-09-20 DIAGNOSIS — E785 Hyperlipidemia, unspecified: Secondary | ICD-10-CM | POA: Diagnosis not present

## 2016-09-20 LAB — POCT GLYCOSYLATED HEMOGLOBIN (HGB A1C): Hemoglobin A1C: 6.5

## 2016-09-20 MED ORDER — EZETIMIBE 10 MG PO TABS: 10.0000 mg | ORAL_TABLET | Freq: Every day | ORAL | 3 refills | Status: DC

## 2016-09-20 MED ORDER — SIMVASTATIN 40 MG PO TABS: 40.0000 mg | ORAL_TABLET | Freq: Every day | ORAL | 3 refills | Status: DC

## 2016-09-20 NOTE — Progress Notes (Signed)
   Subjective:    Patient ID: Autumn Bowen, female    DOB: 1946/04/13, 71 y.o.   MRN: [id number]  HPI  Pt is a 71 yo female who presents to the clinic for 3 month DM recheck. She is doing great. On tresbia 18 units at bedtime, trulcity once a week and janumet daily. She denies any hypoglycemia. No open sores or wounds.she did have and episode of yeast infection on skin. It has resolved.    Insurance will not pay for vytorin any more. She needs alternative.    Review of Systems  All other systems reviewed and are negative.      Objective:   Physical Exam  Constitutional: She is oriented to person, place, and time. She appears well-developed and well-nourished.  HENT:  Head: Normocephalic and atraumatic.  Cardiovascular: Normal rate, regular rhythm and normal heart sounds.   Pulmonary/Chest: Effort normal and breath sounds normal.  Neurological: She is alert and oriented to person, place, and time.  Psychiatric: She has a normal mood and affect. Her behavior is normal.          Assessment & Plan:  .Marland KitchenDeaven was seen today for diabetes.  Diagnoses and all orders for this visit:  Type 2 diabetes mellitus with complication, without long-term current use of insulin (HCC) -     POCT HgB A1C -     COMPLETE METABOLIC PANEL WITH GFR  Hyperlipidemia associated with type 2 diabetes mellitus (HCC) -     ezetimibe (ZETIA) 10 MG tablet; Take 1 tablet (10 mg total) by mouth daily. -     Lipid panel -     COMPLETE METABOLIC PANEL WITH GFR -     simvastatin (ZOCOR) 40 MG tablet; Take 1 tablet (40 mg total) by mouth at bedtime.  Medication management -     COMPLETE METABOLIC PANEL WITH GFR  Hypotension, unspecified hypotension type    .Marland Kitchen Results for orders placed or performed in visit on 09/20/16  POCT HgB A1C  Result Value Ref Range   Hemoglobin A1C 6.5    a1c is great.  On ACE. On statin. Follow up in 3 months.   Spilt into 2 separate medications to equal vytorin.    Hold ACE inhibitor for a few days. BP low. Make sure drinking enough water. Occasionally add salt if feeling weak. Follow up in 2 week with nurse visit. Please check BP at home. Needs to be above 90/60 for sure.

## 2016-09-22 DIAGNOSIS — I959 Hypotension, unspecified: Secondary | ICD-10-CM | POA: Insufficient documentation

## 2016-09-26 ENCOUNTER — Other Ambulatory Visit: Payer: Self-pay | Admitting: Physician Assistant

## 2016-09-26 DIAGNOSIS — E118 Type 2 diabetes mellitus with unspecified complications: Secondary | ICD-10-CM

## 2016-09-29 ENCOUNTER — Other Ambulatory Visit: Payer: Self-pay | Admitting: Physician Assistant

## 2016-09-29 DIAGNOSIS — G629 Polyneuropathy, unspecified: Secondary | ICD-10-CM

## 2016-10-04 ENCOUNTER — Other Ambulatory Visit (HOSPITAL_COMMUNITY): Payer: Self-pay | Admitting: Physician Assistant

## 2016-10-04 DIAGNOSIS — I10 Essential (primary) hypertension: Secondary | ICD-10-CM

## 2016-10-19 ENCOUNTER — Other Ambulatory Visit: Payer: Self-pay | Admitting: Physician Assistant

## 2016-11-17 ENCOUNTER — Other Ambulatory Visit: Payer: Self-pay | Admitting: Physician Assistant

## 2016-11-17 DIAGNOSIS — G629 Polyneuropathy, unspecified: Secondary | ICD-10-CM

## 2016-12-02 ENCOUNTER — Other Ambulatory Visit (HOSPITAL_COMMUNITY): Payer: Self-pay | Admitting: Physician Assistant

## 2016-12-14 ENCOUNTER — Other Ambulatory Visit (HOSPITAL_COMMUNITY): Payer: Self-pay | Admitting: Physician Assistant

## 2016-12-20 ENCOUNTER — Ambulatory Visit: Payer: Self-pay | Admitting: Physician Assistant

## 2016-12-21 ENCOUNTER — Ambulatory Visit (INDEPENDENT_AMBULATORY_CARE_PROVIDER_SITE_OTHER): Payer: Medicare HMO | Admitting: Osteopathic Medicine

## 2016-12-21 VITALS — BP 91/59 | HR 84 | Ht 63.0 in | Wt 200.0 lb

## 2016-12-21 DIAGNOSIS — I959 Hypotension, unspecified: Secondary | ICD-10-CM | POA: Diagnosis not present

## 2016-12-21 DIAGNOSIS — I10 Essential (primary) hypertension: Secondary | ICD-10-CM | POA: Diagnosis not present

## 2016-12-21 DIAGNOSIS — E1169 Type 2 diabetes mellitus with other specified complication: Secondary | ICD-10-CM

## 2016-12-21 DIAGNOSIS — E559 Vitamin D deficiency, unspecified: Secondary | ICD-10-CM | POA: Diagnosis not present

## 2016-12-21 DIAGNOSIS — E785 Hyperlipidemia, unspecified: Secondary | ICD-10-CM | POA: Diagnosis not present

## 2016-12-21 DIAGNOSIS — E118 Type 2 diabetes mellitus with unspecified complications: Secondary | ICD-10-CM

## 2016-12-21 DIAGNOSIS — E039 Hypothyroidism, unspecified: Secondary | ICD-10-CM | POA: Diagnosis not present

## 2016-12-21 DIAGNOSIS — Z79899 Other long term (current) drug therapy: Secondary | ICD-10-CM

## 2016-12-21 MED ORDER — METFORMIN HCL ER 750 MG PO TB24: 750.0000 mg | ORAL_TABLET | Freq: Every day | ORAL | 1 refills | Status: DC

## 2016-12-21 MED ORDER — PROPRANOLOL HCL ER 60 MG PO CP24: ORAL_CAPSULE | ORAL | 0 refills | Status: DC

## 2016-12-21 MED ORDER — INSULIN DEGLUDEC 200 UNIT/ML ~~LOC~~ SOPN: 10.0000 [IU] | PEN_INJECTOR | Freq: Every day | SUBCUTANEOUS | 2 refills | Status: DC

## 2016-12-21 NOTE — Progress Notes (Signed)
HPI: Autumn Bowen is a 71 y.o. female  who presents to [facility] today, 12/21/16,  for chief complaint of:  Chief Complaint  Patient presents with  . discuss medications   Patient is to appointment today accompanied by son. Has some questions about medications. It would like to reduce medication burden to avoid confusion with medicines, possible interactions, and in consideration of cost of medicines.  Of particular concern with psychiatric medications: They do not understand why she is taking Seroquel. Based on brief review of records, 80 symptoms or for sleep issues in addition to severe depression. Patient states that her moods are okay, she still has some trouble sleeping.  Of particular concern with diabetic medications: Her blood sugar dropped once on the insulin, she has backed off on the dose to 12 units. She is still taking the sitagliptin-metformin as well as the once weekly Trulicity.  Of particular concern with antihypertensives: She is on low-dose lisinopril likely for renal protection in diabetic, she is on propranolol for uncertain cause, patient states she thinks it may have been due to heart palpitations at some point. Blood pressure has been low on the past few visits to the office. She denies dizziness or fall.    Past medical, surgical, social and family history reviewed: Patient Active Problem List   Diagnosis Date Noted  . Hypotension 09/22/2016  . Osteopenia 05/24/2016  . Abdominal pain 04/12/2016  . Diverticulitis of colon 04/12/2016  . Broken or cracked tooth, nontraumatic 03/27/2016  . Memory deficit 05/05/2015  . Balance problems 05/05/2015  . Hearing difficulty of both ears 05/05/2015  . Mouth lesion 05/04/2015  . OSA (obstructive sleep apnea) 04/28/2015  . Seborrheic keratoses 01/31/2015  . Thyroid activity decreased 01/31/2015  . Left-sided low back pain with left-sided sciatica 01/31/2015  . GERD  (gastroesophageal reflux disease) 01/26/2015  . Celiac disease 01/26/2015  . Cervical spondylosis 12/04/2014  . Osteoarthritis of first metatarsophalangeal joint 11/17/2014  . Ingrown left big toenail 10/04/2014  . Seasonal allergies 10/04/2014  . Neuropathy 10/04/2014  . Toenail fungus 10/03/2014  . Adenomatous polyp of colon 07/30/2014  . Macular degeneration, dry 07/29/2014  . Type 2 diabetes mellitus with complication ([place]) 29/47/6546  . Migraine without aura and without status migrainosus, not intractable 07/28/2014  . EMPHYSEMA 05/11/2009  . COPD (chronic obstructive pulmonary disease) with chronic bronchitis ([place]) 05/10/2009  . VISION DISORDER 12/03/2008  . OSTEOPENIA 11/18/2008  . VITAMIN D DEFICIENCY 11/06/2008  . Major depressive disorder 11/05/2008  . FATIGUE 11/05/2008  . ANEMIA 10/23/2008  . DYSLIPIDEMIA 10/16/2008  . TOBACCO ABUSE 10/15/2008  . ALLERGIC RHINITIS 10/15/2008  . FRACTURE, ANKLE, RIGHT 04/29/2008  . DIARRHEA 07/01/2007  . POST TRAUMATIC STRESS SYNDROME 05/30/2000  . Hypothyroidism 05/30/1988  . POSTMENOPAUSAL STATUS 05/30/1968   Past Surgical History:  Procedure Laterality Date  . APPENDECTOMY    . BREAST SURGERY     h/o benign cystleft breast and lymp node removal on right breast area.  Marland Kitchen EYE SURGERY     Left eye cataract removal, right eye h/o macular degeneration.  Marland Kitchen SPINE SURGERY     Pt not sure of the type of surgery she had but knows it was L4-5.  Marland Kitchen VAGINAL HYSTERECTOMY     Social History  Substance Use Topics  . Smoking status: Current Every Day Smoker    Packs/day: 0.50    Years: 51.00    Types: Cigarettes    Start date: 01/02/2012  . Smokeless tobacco:  Never Used  . Alcohol use No   Family History  Problem Relation Age of Onset  . Anxiety disorder Mother   . Depression Mother   . Heart attack Mother   . Stroke Mother   . Diabetes type II Mother   . Diabetes Father   . Glaucoma Father   . Hypertension Father   . COPD Father    . COPD Sister   . Hashimoto's thyroiditis Sister   . Cancer Sister   . Kidney disease Sister   . Drug abuse Brother   . Alcohol abuse Brother   . Asthma Son   . Diabetes Son      Current medication list and allergy/intolerance information reviewed:   Current Outpatient Prescriptions  Medication Sig Dispense Refill  . ACCU-CHEK AVIVA PLUS test strip test 1-2 TIMES daily 100 each 3  . albuterol (PROVENTIL HFA;VENTOLIN HFA) 108 (90 BASE) MCG/ACT inhaler Inhale 2 puffs into the lungs every 6 (six) hours as needed.    . AMBULATORY NON FORMULARY MEDICATION Glucometer, test strips and lancets for testing once a day for DM, uncontrolled. 100 strip 0  . amitriptyline (ELAVIL) 50 MG tablet take 1/2 tablet at bedtime for one week then increase to 1 (one) tablet at bedtime 90 tablet 3  . cyclobenzaprine (FLEXERIL) 5 MG tablet Take 5 mg by mouth 2 (two) times daily as needed for muscle spasms.    Marland Kitchen dicyclomine (BENTYL) 10 MG capsule Take 1 capsule by mouth three times a day as needed  5  . Dulaglutide (TRULICITY) 3.14 HF/0.2OV SOPN Inject 0.75 mg into the skin once a week. 2 mL 2  . EPIPEN 2-PAK 0.3 MG/0.3ML SOAJ injection USE AS DIRECTED 1 Device 1  . ezetimibe (ZETIA) 10 MG tablet Take 1 tablet (10 mg total) by mouth daily. 90 tablet 3  . famotidine (PEPCID) 20 MG tablet Take 1 tablet (20 mg total) by mouth daily. 90 tablet 4  . Fluticasone-Salmeterol (ADVAIR) 250-50 MCG/DOSE AEPB Inhale 2 puffs into the lungs every 12 (twelve) hours.    Marland Kitchen GLUCOSAMINE-CHONDROITIN PO Take by mouth.    . Insulin Degludec (TRESIBA FLEXTOUCH) 200 UNIT/ML SOPN Inject 16 Units into the skin at bedtime. Increase by 2 units every 5 days until fasting glucose 120. This is a 30 day supply., 4 pen 2  . levocetirizine (XYZAL) 5 MG tablet Take 5 mg by mouth every evening.    Marland Kitchen levothyroxine (SYNTHROID, LEVOTHROID) 175 MCG tablet Take 1 tablet (175 mcg total) by mouth daily. 90 tablet 3  . lisinopril (PRINIVIL,ZESTRIL) 2.5 MG  tablet Take 1 tablet (2.5 mg total) by mouth daily. 90 tablet 0  . LYRICA 50 MG capsule TAKE ONE CAPSULE THREE TIMES DAILY 90 capsule 1  . NAPROXEN SODIUM PO Take 220 mg by mouth 2 (two) times daily as needed.    . ondansetron (ZOFRAN) 8 MG tablet TAKE 1 TABLET BY MOUTH EVERY 8 HOURS AS NEEDED FOR NAUSEA AND VOMITING 20 tablet 2  . promethazine (PHENERGAN) 25 MG tablet Take 1 tablet (25 mg total) by mouth every 6 (six) hours as needed for nausea or vomiting. 30 tablet 0  . propranolol ER (INDERAL LA) 160 MG SR capsule Take 1 capsule (160 mg total) by mouth daily. 30 capsule 2  . QUEtiapine (SEROQUEL) 200 MG tablet Take 1 tablet (200 mg total) by mouth at bedtime. APPOINTMENT NEEDED FOR FURTHER REFILLS 30 tablet 0  . sertraline (ZOLOFT) 100 MG tablet Take 2 tablets (200 mg total) by  mouth daily. 60 tablet 1  . simvastatin (ZOCOR) 40 MG tablet Take 1 tablet (40 mg total) by mouth at bedtime. 90 tablet 3  . SitaGLIPtin-MetFORMIN HCl (JANUMET XR) [phone] MG TB24 Take one tablet daily. 30 tablet 5  . triamcinolone cream (KENALOG) 0.1 % Apply 1 application topically 2 (two) times daily. For itchy bumps. 60 g 1   No current facility-administered medications for this visit.    Allergies  Allergen Reactions  . Atorvastatin   . Esomeprazole Magnesium   . Gluten Meal   . Ivp Dye [Iodinated Diagnostic Agents] Other (See Comments)    Per patient cardiac arrest  . Latex   . Penicillins   . Trazodone And Nefazodone Hives  . Zolpidem Tartrate Nausea And Vomiting      Review of Systems:  Constitutional:  No  fever, no chills, No recent illness, No unintentional weight changes. + fatigue.   HEENT: No  headache, no vision change  Cardiac: No  chest pain, No  pressure, No palpitations, No  Orthopnea  Respiratory:  No  shortness of breath. No  Cough  Gastrointestinal: No  abdominal pain, No  nausea, No  vomiting  Musculoskeletal: No new myalgia/arthralgia  Neurologic: No  weakness, No   dizziness, No  slurred speech/focal weakness/facial droop  Psychiatric: +concerns with depression, No  concerns with anxiety, No sleep problems, No mood problems  Exam:  BP (!) 91/59   Pulse 84   Ht 5' 3"  (1.6 m)   Wt 200 lb (90.7 kg)   LMP 09/21/1974   BMI 35.43 kg/m   Constitutional: VS see above. General Appearance: alert, well-developed, well-nourished, NAD  Eyes: Normal lids and conjunctive, non-icteric sclera  Ears, Nose, Mouth, Throat: MMM, Normal external inspection ears/nares/mouth/lips/gums.   Neck: No masses, trachea midline. No thyroid enlargement.   Respiratory: Normal respiratory effort. no wheeze, no rhonchi, no rales  Cardiovascular: S1/S2 normal, no murmur, no rub/gallop auscultated. RRR. No lower extremity edema.   Musculoskeletal: Gait normal.   Neurological: Normal balance/coordination. No tremor.   Psychiatric: Normal judgment/insight. Normal mood and affect. Oriented x3.      ASSESSMENT/PLAN: With consideration for medication burden, providing for likely cheapest medications, and concerns with low blood pressure and possible medication side effects, have adjusted medications as noted below and sent in some new prescriptions to taper off or at least down on beta blockers,  Medication management - See specifics in patient instructions  Essential hypertension, benign - Discontinue lisinopril and taper off beta blocker, advised may experience rebound palpitations - Plan: propranolol ER (INDERAL LA) 60 MG 24 hr capsule, COMPLETE METABOLIC PANEL WITH GFR, CBC  Type 2 diabetes mellitus with complication, without long-term current use of insulin (HCC) - Most recent A1c below goal for age, can back off a bit on medications - Plan: Hemoglobin A1c  Hyperlipidemia associated with type 2 diabetes mellitus ([place]) - Plan: Lipid panel  Hypotension, unspecified hypotension type - Plan: COMPLETE METABOLIC PANEL WITH GFR, CBC  Vitamin D deficiency - Plan: VITAMIN D 25  Hydroxy (Vit-D Deficiency, Fractures)  Hypothyroidism, unspecified type - Plan: TSH    Patient Instructions  Plan:  Diabetes medicine: Switch pill to just metformin Stop the weekly injectable Continue the insulin daily at 10-12 units - fasting sugar goal is 80-120  Blood pressure Reduce Propranolol  Stop Lisinopril   Gastrointestinal: Continue nausea medicines as needed (Zofran and Phenergan) Continue Dicyclomine   Psychiatric Try stopping Seroquel Continue Zoloft Continue Amitriptyline     Visit summary  with medication list and pertinent instructions was printed for patient to review. All questions at time of visit were answered - patient instructed to contact office with any additional concerns. ER/RTC precautions were reviewed with the patient. Follow-up plan: Return for establish with Dr Sheppard Coil and review labs and medication changes .  Note: Total time spent 40 minutes, greater than 50% of the visit was spent face-to-face counseling and coordinating care for the following: The primary encounter diagnosis was Medication management. Diagnoses of Essential hypertension, benign, Type 2 diabetes mellitus with complication, without long-term current use of insulin ([place]), Hyperlipidemia associated with type 2 diabetes mellitus ([place]), Hypotension, unspecified hypotension type, Vitamin D deficiency, and Hypothyroidism, unspecified type were also pertinent to this visit.Marland Kitchen

## 2016-12-21 NOTE — Patient Instructions (Signed)
Plan:  Diabetes medicine: Switch pill to just metformin Stop the weekly injectable Continue the insulin daily at 10-12 units - fasting sugar goal is 80-120  Blood pressure Reduce Propranolol  Stop Lisinopril   Gastrointestinal: Continue nausea medicines as needed (Zofran and Phenergan) Continue Dicyclomine   Psychiatric Try stopping Seroquel Continue Zoloft Continue Amitriptyline

## 2016-12-28 ENCOUNTER — Ambulatory Visit (INDEPENDENT_AMBULATORY_CARE_PROVIDER_SITE_OTHER): Payer: Medicare HMO | Admitting: Osteopathic Medicine

## 2016-12-28 ENCOUNTER — Encounter: Payer: Self-pay | Admitting: Osteopathic Medicine

## 2016-12-28 VITALS — BP 95/60 | HR 69 | Ht 63.0 in | Wt 202.0 lb

## 2016-12-28 DIAGNOSIS — E039 Hypothyroidism, unspecified: Secondary | ICD-10-CM | POA: Diagnosis not present

## 2016-12-28 DIAGNOSIS — I959 Hypotension, unspecified: Secondary | ICD-10-CM | POA: Diagnosis not present

## 2016-12-28 DIAGNOSIS — E118 Type 2 diabetes mellitus with unspecified complications: Secondary | ICD-10-CM | POA: Diagnosis not present

## 2016-12-28 DIAGNOSIS — E785 Hyperlipidemia, unspecified: Secondary | ICD-10-CM | POA: Diagnosis not present

## 2016-12-28 DIAGNOSIS — F4310 Post-traumatic stress disorder, unspecified: Secondary | ICD-10-CM | POA: Diagnosis not present

## 2016-12-28 DIAGNOSIS — E1169 Type 2 diabetes mellitus with other specified complication: Secondary | ICD-10-CM | POA: Diagnosis not present

## 2016-12-28 DIAGNOSIS — F331 Major depressive disorder, recurrent, moderate: Secondary | ICD-10-CM | POA: Diagnosis not present

## 2016-12-28 DIAGNOSIS — G43009 Migraine without aura, not intractable, without status migrainosus: Secondary | ICD-10-CM | POA: Diagnosis not present

## 2016-12-28 DIAGNOSIS — E559 Vitamin D deficiency, unspecified: Secondary | ICD-10-CM | POA: Diagnosis not present

## 2016-12-28 DIAGNOSIS — I10 Essential (primary) hypertension: Secondary | ICD-10-CM | POA: Diagnosis not present

## 2016-12-28 NOTE — Patient Instructions (Signed)
Plan:   We need to get blood work done before we can do much else - I just don't have the information I need to best take care of you without some blood test results.    Continue current insulin dose of 16 units per day unless your fasting sugar drops   Go down to ONE tablet of the Propranolol 60 mg dose for one week, then STOP this medicine     Come see me in 1 week - bring ALL your medications (pill bottles, over-the-counter medicines, insulin, EVERYTHING) to that visit so we can review and confirm everything

## 2016-12-28 NOTE — Progress Notes (Signed)
HPI: Autumn Bowen is a 71 y.o. female  who presents to [place] today, 12/28/16,  for chief complaint of:  Chief Complaint  Patient presents with  . Establish Care    Patient seen last week for concerns for being on too many medications. Decided to switch to me as PCP. Here for follow-up from medication changes - goal is to simplify medication regimen and reduce cost of medications.  Type 2 diabetes:  Switch pill suggest metformin, stopped weekly injectable. Continued with Tresiba insulin daily at 10-12 units with education on fasting blood sugar goals. She has been taking 16 units daily. She is concerned that fasting sugars have been over 100. Sounds a bit confused about plan as discussed at last visit. Still taking Janumet she thinks.   Blood pressure: Due to hypotension concerns, we stopped lisinopril with discussion of risks versus benefits of doing this given benefits for renal protection in diabetic patients. We also reduced dose of propranolol rather than stopping this altogether to avoid rebound palpitations. Patient was unclear why she was on beta blocker. She thinks she has stopped the Lisinopril.   Psychiatric: Son had a lot of concerns about Seroquel side effects, we decided to stop this given sedating effects and possible weight gain. Continuing Zoloft and amitriptyline. History of migraine and chronic pain is noted on problem list, amitriptyline likely helping this. Post traumatic stress syndrome is also noted on problem list. No history of mood disorder or dementia.   Concerned about falling and fatigue x2 years. Better this past year in terms of fewr falls. Concerned about fatigue mostly.   Labs were also ordered at last visit to check lipids, thyroid, blood count, CMP, A1c, and vitamin D but these are not drawn yet so no results to review.    Patient is NOT accompanied by anyone who can assist with history-taking. Son was with her at  last visit     Past medical, surgical, social and family history reviewed: Patient Active Problem List   Diagnosis Date Noted  . Hypotension 09/22/2016  . Osteopenia 05/24/2016  . Abdominal pain 04/12/2016  . Diverticulitis of colon 04/12/2016  . Broken or cracked tooth, nontraumatic 03/27/2016  . Memory deficit 05/05/2015  . Balance problems 05/05/2015  . Hearing difficulty of both ears 05/05/2015  . Mouth lesion 05/04/2015  . OSA (obstructive sleep apnea) 04/28/2015  . Seborrheic keratoses 01/31/2015  . Thyroid activity decreased 01/31/2015  . Left-sided low back pain with left-sided sciatica 01/31/2015  . GERD (gastroesophageal reflux disease) 01/26/2015  . Celiac disease 01/26/2015  . Cervical spondylosis 12/04/2014  . Osteoarthritis of first metatarsophalangeal joint 11/17/2014  . Ingrown left big toenail 10/04/2014  . Seasonal allergies 10/04/2014  . Neuropathy 10/04/2014  . Toenail fungus 10/03/2014  . Adenomatous polyp of colon 07/30/2014  . Macular degeneration, dry 07/29/2014  . Type 2 diabetes mellitus with complication ([place]) 05/39/7673  . Migraine without aura and without status migrainosus, not intractable 07/28/2014  . EMPHYSEMA 05/11/2009  . COPD (chronic obstructive pulmonary disease) with chronic bronchitis ([place]) 05/10/2009  . VISION DISORDER 12/03/2008  . OSTEOPENIA 11/18/2008  . VITAMIN D DEFICIENCY 11/06/2008  . Major depressive disorder 11/05/2008  . FATIGUE 11/05/2008  . ANEMIA 10/23/2008  . DYSLIPIDEMIA 10/16/2008  . TOBACCO ABUSE 10/15/2008  . ALLERGIC RHINITIS 10/15/2008  . FRACTURE, ANKLE, RIGHT 04/29/2008  . DIARRHEA 07/01/2007  . POST TRAUMATIC STRESS SYNDROME 05/30/2000  . Hypothyroidism 05/30/1988  . POSTMENOPAUSAL STATUS 05/30/1968   Past  Surgical History:  Procedure Laterality Date  . APPENDECTOMY    . BREAST SURGERY     h/o benign cystleft breast and lymp node removal on right breast area.  [facility] EYE SURGERY     Left eye cataract  removal, right eye h/o macular degeneration.  [facility] Kitchen SPINE SURGERY     Pt not sure of the type of surgery she had but knows it was L4-5.  [facility] Kitchen VAGINAL HYSTERECTOMY     Social History  Substance Use Topics  . Smoking status: Current Every Day Smoker    Packs/day: 0.50    Years: 51.00    Types: Cigarettes    Start date: 01/02/2012  . Smokeless tobacco: Never Used  . Alcohol use No   Family History  Problem Relation Age of Onset  . Anxiety disorder Mother   . Depression Mother   . Heart attack Mother   . Stroke Mother   . Diabetes type II Mother   . Diabetes Father   . Glaucoma Father   . Hypertension Father   . COPD Father   . COPD Sister   . Hashimoto's thyroiditis Sister   . Cancer Sister   . Kidney disease Sister   . Drug abuse Brother   . Alcohol abuse Brother   . Asthma Son   . Diabetes Son      Current medication list and allergy/intolerance information reviewed:   Current Outpatient Prescriptions  Medication Sig Dispense Refill  . ACCU-CHEK AVIVA PLUS test strip test 1-2 TIMES daily 100 each 3  . albuterol (PROVENTIL HFA;VENTOLIN HFA) 108 (90 BASE) MCG/ACT inhaler Inhale 2 puffs into the lungs every 6 (six) hours as needed.    . AMBULATORY NON FORMULARY MEDICATION Glucometer, test strips and lancets for testing once a day for DM, uncontrolled. 100 strip 0  . amitriptyline (ELAVIL) 50 MG tablet take 1/2 tablet at bedtime for one week then increase to 1 (one) tablet at bedtime 90 tablet 3  . cyclobenzaprine (FLEXERIL) 5 MG tablet Take 5 mg by mouth 2 (two) times daily as needed for muscle spasms.    Marland Kitchen dicyclomine (BENTYL) 10 MG capsule Take 1 capsule by mouth three times a day as needed  5  . EPIPEN 2-PAK 0.3 MG/0.3ML SOAJ injection USE AS DIRECTED 1 Device 1  . ezetimibe (ZETIA) 10 MG tablet Take 1 tablet (10 mg total) by mouth daily. 90 tablet 3  . famotidine (PEPCID) 20 MG tablet Take 1 tablet (20 mg total) by mouth daily. 90 tablet 4  . Fluticasone-Salmeterol (ADVAIR)  250-50 MCG/DOSE AEPB Inhale 2 puffs into the lungs every 12 (twelve) hours.    Marland Kitchen GLUCOSAMINE-CHONDROITIN PO Take by mouth.    . Insulin Degludec (TRESIBA FLEXTOUCH) 200 UNIT/ML SOPN Inject 10 Units into the skin at bedtime. Increase by 2 units every 5 days until fasting glucose 120. This is a 30 day supply., 4 pen 2  . levocetirizine (XYZAL) 5 MG tablet Take 5 mg by mouth every evening.    Marland Kitchen levothyroxine (SYNTHROID, LEVOTHROID) 175 MCG tablet Take 1 tablet (175 mcg total) by mouth daily. 90 tablet 3  . LYRICA 50 MG capsule TAKE ONE CAPSULE THREE TIMES DAILY 90 capsule 1  . metFORMIN (GLUCOPHAGE-XR) 750 MG 24 hr tablet Take 1 tablet (750 mg total) by mouth daily with breakfast. 90 tablet 1  . NAPROXEN SODIUM PO Take 220 mg by mouth 2 (two) times daily as needed.    . ondansetron (ZOFRAN) 8 MG tablet TAKE 1 TABLET  BY MOUTH EVERY 8 HOURS AS NEEDED FOR NAUSEA AND VOMITING 20 tablet 2  . promethazine (PHENERGAN) 25 MG tablet Take 1 tablet (25 mg total) by mouth every 6 (six) hours as needed for nausea or vomiting. 30 tablet 0  . propranolol ER (INDERAL LA) 60 MG 24 hr capsule Take 120 mg (2 pills) by mouth once per day for one week, then 60 mg (1 pill) orally once per day for one week, then stop 30 capsule 0  . sertraline (ZOLOFT) 100 MG tablet Take 2 tablets (200 mg total) by mouth daily. 60 tablet 1  . simvastatin (ZOCOR) 40 MG tablet Take 1 tablet (40 mg total) by mouth at bedtime. 90 tablet 3  . triamcinolone cream (KENALOG) 0.1 % Apply 1 application topically 2 (two) times daily. For itchy bumps. 60 g 1   No current facility-administered medications for this visit.    Allergies  Allergen Reactions  . Atorvastatin   . Esomeprazole Magnesium   . Gluten Meal   . Ivp Dye [Iodinated Diagnostic Agents] Other (See Comments)    Per patient cardiac arrest  . Latex   . Penicillins   . Trazodone And Nefazodone Hives  . Zolpidem Tartrate Nausea And Vomiting      Review of  Systems:  Constitutional:  No  fever, no chills, No unintentional weight changes. +significant fatigue.   HEENT: No  headache, no vision change  Cardiac: No  chest pain, No  pressure, No palpitation  Respiratory:  No  shortness of breath. No  Cough  Gastrointestinal: No  abdominal pain  Musculoskeletal: No new myalgia/arthralgia  Neurologic: No  weakness, No  dizziness,  Psychiatric: No  concerns with depression, No  concerns with anxiety, No sleep problems, No mood problems  Exam:  BP 95/60   Pulse 69   Ht 5' 3"  (1.6 m)   Wt 202 lb (91.6 kg)   LMP 09/21/1974   BMI 35.78 kg/m   Constitutional: VS see above. General Appearance: alert, well-developed, well-nourished, NAD  Eyes: Normal lids and conjunctive, non-icteric sclera  Ears, Nose, Mouth, Throat: MMM, Normal external inspection ears/nares/mouth/lips/gums.   Neck: No masses, trachea midline.   Respiratory: Normal respiratory effort. no wheeze, no rhonchi, no rales  Cardiovascular: S1/S2 normal, no murmur, no rub/gallop auscultated. RRR. No lower extremity edema.   Gastrointestinal: Nontender, no masses. No hepatomegaly, no splenomegaly.   Musculoskeletal: Gait normal. No clubbing/cyanosis of digits.   Neurological: Normal balance/coordination. No tremor.   Skin: warm, dry, intact. No rash/ulcer.  Psychiatric: Normal judgment/insight.    ASSESSMENT/PLAN:   Hypotension, unspecified hypotension type - Blood pressure slightly better from previous, decrease propranolol with plan to come off of this next couple weeks  Migraine without aura and without status migrainosus, not intractable - If headaches recur off propranolol, we'll restart or consider increased dose amitriptyline  Hypothyroidism, unspecified type - Stressed need to recheck TSH levels if we want to further evaluate fatigue  Type 2 diabetes mellitus with complication, without long-term current use of insulin (HCC)  Moderate episode of recurrent  major depressive disorder (HCC)  POST TRAUMATIC STRESS SYNDROME - Consider psychiatry follow-up, this may have been why she was on Seroquel    Patient Instructions  Plan:   We need to get blood work done before we can do much else - I just don't have the information I need to best take care of you without some blood test results.    Continue current insulin dose of 16 units per  day unless your fasting sugar drops   Go down to ONE tablet of the Propranolol 60 mg dose for one week, then STOP this medicine     Come see me in 1 week - bring ALL your medications (pill bottles, over-the-counter medicines, insulin, EVERYTHING) to that visit so we can review and confirm everything     Visit summary with medication list and pertinent instructions was printed for patient to review. All questions at time of visit were answered - patient instructed to contact office with any additional concerns. ER/RTC precautions were reviewed with the patient. Follow-up plan: Return in about 1 week (around 01/04/2017) for recheck blood pressure and review labs .

## 2016-12-29 LAB — COMPLETE METABOLIC PANEL WITH GFR
ALT: 17 U/L (ref 6–29)
AST: 23 U/L (ref 10–35)
Albumin: 3.9 g/dL (ref 3.6–5.1)
Alkaline Phosphatase: 53 U/L (ref 33–130)
BUN: 11 mg/dL (ref 7–25)
CO2: 26 mmol/L (ref 20–31)
Calcium: 9.5 mg/dL (ref 8.6–10.4)
Chloride: 104 mmol/L (ref 98–110)
Creat: 0.7 mg/dL (ref 0.60–0.93)
GFR, Est African American: >89 mL/min (ref >=60)
GFR, Est Non African American: 87 mL/min (ref >=60)
Glucose, Bld: 100 mg/dL — ABNORMAL HIGH (ref 65–99)
Potassium: 4.8 mmol/L (ref 3.5–5.3)
Sodium: 139 mmol/L (ref 135–146)
Total Bilirubin: 0.3 mg/dL (ref 0.2–1.2)
Total Protein: 6.5 g/dL (ref 6.1–8.1)

## 2016-12-29 LAB — HEMOGLOBIN A1C
Hgb A1c MFr Bld: 6.4 % — ABNORMAL HIGH (ref <5.7)
Mean Plasma Glucose: 137 mg/dL

## 2016-12-29 LAB — LIPID PANEL
Cholesterol: 133 mg/dL (ref <200)
HDL: 52 mg/dL (ref >50)
LDL Cholesterol: 38 mg/dL (ref <100)
Total CHOL/HDL Ratio: 2.6 Ratio (ref <5.0)
Triglycerides: 213 mg/dL — ABNORMAL HIGH (ref <150)
VLDL: 43 mg/dL — ABNORMAL HIGH (ref <30)

## 2016-12-29 LAB — CBC
HCT: 45.2 % — ABNORMAL HIGH (ref 35.0–45.0)
Hemoglobin: 14.4 g/dL (ref 11.7–15.5)
MCH: 29.2 pg (ref 27.0–33.0)
MCHC: 31.9 g/dL — ABNORMAL LOW (ref 32.0–36.0)
MCV: 91.7 fL (ref 80.0–100.0)
MPV: 10.6 fL (ref 7.5–12.5)
Platelets: 292 10*3/uL (ref 140–400)
RBC: 4.93 MIL/uL (ref 3.80–5.10)
RDW: 14.2 % (ref 11.0–15.0)
WBC: 9.1 10*3/uL (ref 3.8–10.8)

## 2016-12-29 LAB — VITAMIN D 25 HYDROXY (VIT D DEFICIENCY, FRACTURES): Vit D, 25-Hydroxy: 48 ng/mL (ref 30–100)

## 2016-12-29 LAB — TSH: TSH: 0.05 mIU/L — ABNORMAL LOW

## 2016-12-30 ENCOUNTER — Other Ambulatory Visit: Payer: Self-pay | Admitting: Osteopathic Medicine

## 2016-12-30 MED ORDER — LEVOTHYROXINE SODIUM 137 MCG PO TABS: 137.0000 ug | ORAL_TABLET | Freq: Every day | ORAL | 0 refills | Status: DC

## 2016-12-30 NOTE — Progress Notes (Signed)
Adjust dose of Synthroid

## 2017-01-02 ENCOUNTER — Other Ambulatory Visit: Payer: Self-pay

## 2017-01-02 DIAGNOSIS — E039 Hypothyroidism, unspecified: Secondary | ICD-10-CM

## 2017-01-03 ENCOUNTER — Other Ambulatory Visit: Payer: Self-pay | Admitting: Physician Assistant

## 2017-01-03 DIAGNOSIS — G629 Polyneuropathy, unspecified: Secondary | ICD-10-CM

## 2017-01-04 ENCOUNTER — Ambulatory Visit (INDEPENDENT_AMBULATORY_CARE_PROVIDER_SITE_OTHER): Payer: Medicare HMO | Admitting: Osteopathic Medicine

## 2017-01-04 ENCOUNTER — Encounter: Payer: Self-pay | Admitting: Osteopathic Medicine

## 2017-01-04 VITALS — BP 99/63 | HR 89 | Resp 20 | Wt 200.0 lb

## 2017-01-04 DIAGNOSIS — F331 Major depressive disorder, recurrent, moderate: Secondary | ICD-10-CM | POA: Diagnosis not present

## 2017-01-04 DIAGNOSIS — E1169 Type 2 diabetes mellitus with other specified complication: Secondary | ICD-10-CM | POA: Diagnosis not present

## 2017-01-04 DIAGNOSIS — E785 Hyperlipidemia, unspecified: Secondary | ICD-10-CM | POA: Diagnosis not present

## 2017-01-04 DIAGNOSIS — E039 Hypothyroidism, unspecified: Secondary | ICD-10-CM | POA: Diagnosis not present

## 2017-01-04 DIAGNOSIS — E118 Type 2 diabetes mellitus with unspecified complications: Secondary | ICD-10-CM

## 2017-01-04 DIAGNOSIS — Z79899 Other long term (current) drug therapy: Secondary | ICD-10-CM | POA: Diagnosis not present

## 2017-01-04 DIAGNOSIS — G43009 Migraine without aura, not intractable, without status migrainosus: Secondary | ICD-10-CM | POA: Diagnosis not present

## 2017-01-04 DIAGNOSIS — I959 Hypotension, unspecified: Secondary | ICD-10-CM

## 2017-01-04 NOTE — Progress Notes (Signed)
HPI: Autumn Bowen is a 71 y.o. female  who presents to [facility] today, 01/04/17,  for chief complaint of:  Chief Complaint  Patient presents with  . Follow-up    Here for follow-up from medication changes - goal is to simplify medication regimen and reduce cost of medications.  Type 2 diabetes:  Switch pill to metformin, stopped weekly injectable. Continued with Tresiba insulin daily at 10-12 units with education on fasting blood sugar goals. She had been taking 16 units daily. She is concerned that fasting sugars have been over 100. Sounds a bit confused about plan as discussed at last visit. She is taking Metformin once daily.   Blood pressure: Due to hypotension concerns, we stopped lisinopril with discussion of risks versus benefits of doing this given benefits for renal protection in diabetic patients. We also reduced dose of propranolol rather than stopping this altogether to avoid rebound palpitations. Patient was unclear why she was on beta blocker. As of last visit, she was not sure if she had stopped the Lisinopril but was pretty sure she had. She has a total bottle with her today. She is down to 1 tablet of the 60 mg propranolol. Blood pressure is improved but still a bit on the low side.  Psychiatric: Son-in-law had a lot of concerns about Seroquel side effects, we decided to stop this given sedating effects and possible weight gain - she has not stopped this medication. Continuing Zoloft and amitriptyline. History of migraine and chronic pain is noted on problem list, amitriptyline likely helping this. Post traumatic stress syndrome is also noted on problem list. No history of mood disorder or dementia. She states insomnia has been a big issue for her, she is nervous to stop the Seroquel and she thinks she has still been taking it.   Patient is NOT accompanied by anyone who can assist with history-taking. Son in law was with her at previous  visit     Past medical, surgical, social and family history reviewed: Patient Active Problem List   Diagnosis Date Noted  . Hypotension 09/22/2016  . Osteopenia 05/24/2016  . Abdominal pain 04/12/2016  . Diverticulitis of colon 04/12/2016  . Broken or cracked tooth, nontraumatic 03/27/2016  . Memory deficit 05/05/2015  . Balance problems 05/05/2015  . Hearing difficulty of both ears 05/05/2015  . Mouth lesion 05/04/2015  . OSA (obstructive sleep apnea) 04/28/2015  . Seborrheic keratoses 01/31/2015  . Left-sided low back pain with left-sided sciatica 01/31/2015  . GERD (gastroesophageal reflux disease) 01/26/2015  . Celiac disease 01/26/2015  . Cervical spondylosis 12/04/2014  . Osteoarthritis of first metatarsophalangeal joint 11/17/2014  . Ingrown left big toenail 10/04/2014  . Seasonal allergies 10/04/2014  . Neuropathy 10/04/2014  . Toenail fungus 10/03/2014  . Adenomatous polyp of colon 07/30/2014  . Macular degeneration, dry 07/29/2014  . Type 2 diabetes mellitus with complication ([place]) 21/03/7355  . Migraine without aura and without status migrainosus, not intractable 07/28/2014  . EMPHYSEMA 05/11/2009  . COPD (chronic obstructive pulmonary disease) with chronic bronchitis ([place]) 05/10/2009  . VISION DISORDER 12/03/2008  . OSTEOPENIA 11/18/2008  . VITAMIN D DEFICIENCY 11/06/2008  . Major depressive disorder 11/05/2008  . FATIGUE 11/05/2008  . ANEMIA 10/23/2008  . DYSLIPIDEMIA 10/16/2008  . TOBACCO ABUSE 10/15/2008  . ALLERGIC RHINITIS 10/15/2008  . FRACTURE, ANKLE, RIGHT 04/29/2008  . DIARRHEA 07/01/2007  . POST TRAUMATIC STRESS SYNDROME 05/30/2000  . Hypothyroidism 05/30/1988  . POSTMENOPAUSAL STATUS 05/30/1968   Past Surgical  History:  Procedure Laterality Date  . APPENDECTOMY    . BREAST SURGERY     h/o benign cystleft breast and lymp node removal on right breast area.  [facility] EYE SURGERY     Left eye cataract removal, right eye h/o macular degeneration.  [facility] Kitchen  SPINE SURGERY     Pt not sure of the type of surgery she had but knows it was L4-5.  [facility] Kitchen VAGINAL HYSTERECTOMY     Social History  Substance Use Topics  . Smoking status: Current Every Day Smoker    Packs/day: 0.50    Years: 51.00    Types: Cigarettes    Start date: 01/02/2012  . Smokeless tobacco: Never Used  . Alcohol use No   Family History  Problem Relation Age of Onset  . Anxiety disorder Mother   . Depression Mother   . Heart attack Mother   . Stroke Mother   . Diabetes type II Mother   . Diabetes Father   . Glaucoma Father   . Hypertension Father   . COPD Father   . COPD Sister   . Hashimoto's thyroiditis Sister   . Cancer Sister   . Kidney disease Sister   . Drug abuse Brother   . Alcohol abuse Brother   . Asthma Son   . Diabetes Son      Current medication list and allergy/intolerance information reviewed:   Current Outpatient Prescriptions  Medication Sig Dispense Refill  . ACCU-CHEK AVIVA PLUS test strip test 1-2 TIMES daily 100 each 3  . albuterol (PROVENTIL HFA;VENTOLIN HFA) 108 (90 BASE) MCG/ACT inhaler Inhale 2 puffs into the lungs every 6 (six) hours as needed.    . AMBULATORY NON FORMULARY MEDICATION Glucometer, test strips and lancets for testing once a day for DM, uncontrolled. 100 strip 0  . amitriptyline (ELAVIL) 50 MG tablet take 1/2 tablet at bedtime for one week then increase to 1 (one) tablet at bedtime 90 tablet 3  . dicyclomine (BENTYL) 10 MG capsule Take 1 capsule by mouth three times a day as needed  5  . EPIPEN 2-PAK 0.3 MG/0.3ML SOAJ injection USE AS DIRECTED 1 Device 1  . ezetimibe (ZETIA) 10 MG tablet Take 1 tablet (10 mg total) by mouth daily. 90 tablet 3  . famotidine (PEPCID) 20 MG tablet Take 1 tablet (20 mg total) by mouth daily. 90 tablet 4  . Fluticasone-Salmeterol (ADVAIR) 250-50 MCG/DOSE AEPB Inhale 2 puffs into the lungs every 12 (twelve) hours.    [facility] Kitchen GLUCOSAMINE-CHONDROITIN PO Take by mouth.    . Insulin Degludec (TRESIBA  FLEXTOUCH) 200 UNIT/ML SOPN Inject 10 Units into the skin at bedtime. Increase by 2 units every 5 days until fasting glucose 120. This is a 30 day supply., 4 pen 2  . levocetirizine (XYZAL) 5 MG tablet Take 5 mg by mouth every evening.    Marland Kitchen levothyroxine (SYNTHROID, LEVOTHROID) 137 MCG tablet Take 1 tablet (137 mcg total) by mouth daily before breakfast. When pills in the bottle or running low, will be due to recheck labs 60 tablet 0  . LYRICA 50 MG capsule TAKE ONE CAPSULE THREE TIMES DAILY 90 capsule 0  . metFORMIN (GLUCOPHAGE-XR) 750 MG 24 hr tablet Take 1 tablet (750 mg total) by mouth daily with breakfast. 90 tablet 1  . NAPROXEN SODIUM PO Take 220 mg by mouth 2 (two) times daily as needed.    . ondansetron (ZOFRAN) 8 MG tablet TAKE 1 TABLET BY MOUTH EVERY 8 HOURS AS NEEDED FOR  NAUSEA AND VOMITING 20 tablet 2  . promethazine (PHENERGAN) 25 MG tablet Take 1 tablet (25 mg total) by mouth every 6 (six) hours as needed for nausea or vomiting. 30 tablet 0  . propranolol ER (INDERAL LA) 60 MG 24 hr capsule Take 120 mg (2 pills) by mouth once per day for one week, then 60 mg (1 pill) orally once per day for one week, then stop 30 capsule 0  . sertraline (ZOLOFT) 100 MG tablet Take 2 tablets (200 mg total) by mouth daily. 60 tablet 1  . simvastatin (ZOCOR) 40 MG tablet Take 1 tablet (40 mg total) by mouth at bedtime. 90 tablet 3  . triamcinolone cream (KENALOG) 0.1 % Apply 1 application topically 2 (two) times daily. For itchy bumps. 60 g 1   No current facility-administered medications for this visit.    Allergies  Allergen Reactions  . Atorvastatin   . Esomeprazole Magnesium   . Gluten Meal   . Ivp Dye [Iodinated Diagnostic Agents] Other (See Comments)    Per patient cardiac arrest  . Latex   . Penicillins   . Trazodone And Nefazodone Hives  . Zolpidem Tartrate Nausea And Vomiting      Review of Systems:  Constitutional:  No  fever, no chills, No unintentional weight changes.  +significant fatigue.   HEENT: No  headache, no vision change  Cardiac: No  chest pain, No  pressure, No palpitation  Respiratory:  No  shortness of breath. No  Cough  Gastrointestinal: No  abdominal pain  Musculoskeletal: No new myalgia/arthralgia  Neurologic: No  weakness, No  dizziness,  Psychiatric: No  concerns with depression, No  concerns with anxiety, No sleep problems, No mood problems  Exam:  BP 99/63   Pulse 89   Resp 20   Wt 200 lb (90.7 kg)   LMP 09/21/1974   BMI 35.43 kg/m   Constitutional: VS see above. General Appearance: alert, well-developed, well-nourished, NAD  Eyes: Normal lids and conjunctive, non-icteric sclera  Ears, Nose, Mouth, Throat: MMM, Normal external inspection ears/nares/mouth/lips/gums.   Neck: No masses, trachea midline.   Respiratory: Normal respiratory effort. no wheeze, no rhonchi, no rales  Cardiovascular: S1/S2 normal, no murmur, no rub/gallop auscultated. RRR. No lower extremity edema.   Gastrointestinal: Nontender, no masses. No hepatomegaly, no splenomegaly.   Musculoskeletal: Gait normal. No clubbing/cyanosis of digits.   Neurological: Normal balance/coordination. No tremor.   Skin: warm, dry, intact. No rash/ulcer.  Psychiatric: Fair judgment/insight.    ASSESSMENT/PLAN: I took the time to review the patient's pills or medications with her. We arranged these into bags based on morning medicines, evening medicines, twice a day medicines. Patient was very grateful that we took the time to do this. Advised her that we can go ahead and stop the Seroquel, can restart it if she would like to or if she is experiencing sleeping problems.   Medication management  Hypotension, unspecified hypotension type - We'll stop her propranolol altogether at this point.  Hypothyroidism, unspecified type  Type 2 diabetes mellitus with complication, without long-term current use of insulin (HCC)  Hyperlipidemia associated with type 2  diabetes mellitus ([place]) - Plan: ezetimibe (ZETIA) 10 MG tablet, simvastatin (ZOCOR) 40 MG tablet  Migraine without aura and without status migrainosus, not intractable  Moderate episode of recurrent major depressive disorder ([place]) - Advised patient we can stop Seroquel and see how she does off this medication. Can always restart it.      Visit summary with medication list  and pertinent instructions was printed for patient to review. All questions at time of visit were answered - patient instructed to contact office with any additional concerns. ER/RTC precautions were reviewed with the patient. Follow-up plan: Return in about 2 weeks (around 01/18/2017) for recheck blood pressure .  Total time spent 40 minutes. Greater than 50% of the visit was face-to-face time counseling and coordinating care for the following diagnoses: The primary encounter diagnosis was Medication management. Diagnoses of Hypotension, unspecified hypotension type, Hypothyroidism, unspecified type, Type 2 diabetes mellitus with complication, without long-term current use of insulin ([place]), Hyperlipidemia associated with type 2 diabetes mellitus ([place]), Migraine without aura and without status migrainosus, not intractable, and Moderate episode of recurrent major depressive disorder ([place]) were also pertinent to this visit.

## 2017-01-05 MED ORDER — EZETIMIBE 10 MG PO TABS: 10.0000 mg | ORAL_TABLET | Freq: Every day | ORAL | 3 refills | Status: DC

## 2017-01-05 MED ORDER — SIMVASTATIN 40 MG PO TABS: 40.0000 mg | ORAL_TABLET | Freq: Every day | ORAL | 3 refills | Status: DC

## 2017-01-17 ENCOUNTER — Other Ambulatory Visit (HOSPITAL_COMMUNITY): Payer: Self-pay | Admitting: Osteopathic Medicine

## 2017-01-20 ENCOUNTER — Ambulatory Visit (INDEPENDENT_AMBULATORY_CARE_PROVIDER_SITE_OTHER): Payer: Medicare HMO | Admitting: Osteopathic Medicine

## 2017-01-20 ENCOUNTER — Telehealth: Payer: Self-pay

## 2017-01-20 ENCOUNTER — Encounter: Payer: Self-pay | Admitting: Osteopathic Medicine

## 2017-01-20 VITALS — BP 113/69 | HR 95 | Wt 200.0 lb

## 2017-01-20 DIAGNOSIS — F489 Nonpsychotic mental disorder, unspecified: Secondary | ICD-10-CM

## 2017-01-20 DIAGNOSIS — J418 Mixed simple and mucopurulent chronic bronchitis: Secondary | ICD-10-CM

## 2017-01-20 DIAGNOSIS — I959 Hypotension, unspecified: Secondary | ICD-10-CM

## 2017-01-20 DIAGNOSIS — Z23 Encounter for immunization: Secondary | ICD-10-CM

## 2017-01-20 MED ORDER — FLUTICASONE-SALMETEROL 250-50 MCG/DOSE IN AEPB: 1.0000 | INHALATION_SPRAY | Freq: Two times a day (BID) | RESPIRATORY_TRACT | 11 refills | Status: DC

## 2017-01-20 MED ORDER — ALBUTEROL SULFATE HFA 108 (90 BASE) MCG/ACT IN AERS: 2.0000 | INHALATION_SPRAY | Freq: Four times a day (QID) | RESPIRATORY_TRACT | 5 refills | Status: DC | PRN

## 2017-01-20 MED ORDER — GUAIFENESIN ER 600 MG PO TB12: 600.0000 mg | ORAL_TABLET | Freq: Two times a day (BID) | ORAL | 11 refills | Status: DC | PRN

## 2017-01-20 MED ORDER — QUETIAPINE FUMARATE ER 200 MG PO TB24: 200.0000 mg | ORAL_TABLET | Freq: Every day | ORAL | 1 refills | Status: DC

## 2017-01-20 NOTE — Telephone Encounter (Signed)
The change has been made. Thank you

## 2017-01-20 NOTE — Telephone Encounter (Signed)
PT would like to change PCP provider from [facility] to Dr. Sheppard Coil. Please advise if this change is okay.

## 2017-01-20 NOTE — Telephone Encounter (Signed)
Yes, I have been seeing her several times, I thought we'd officially switched already! Thanks.

## 2017-01-20 NOTE — Telephone Encounter (Signed)
Hey I know at times patients just feel a better connection with one provider or another or they like seeing an MD/DO; but I wanted you to know if there is anything as far as constructive critism that you hear from a patient switch I am always all ears. Thanks.

## 2017-01-20 NOTE — Progress Notes (Signed)
HPI: Autumn Bowen is a 71 y.o. female  who presents to [facility] today, 01/20/17,  for chief complaint of:  Chief Complaint  Patient presents with  . Follow-up on medication changes, sleep issues    COPD: phlegm seems a bit thicker with weather changes. Notices some more coughing than usual. She has not been taking the Advair, cysts as well as her rescue inhalers are expired. She requests scription's.  Follow-up from medication changes - goal is to simplify medication regimen and reduce cost of medications.  At last visit, she brought all pill bottles with her and we arrange these into different bags based on time of day she should take them: one for morning, one for evening, one for twice a day medicines. She is still following this regimen, it is helping her stay on track and her memory medications better   Blood pressure: Due to hypotension concerns, we stopped lisinopril with discussion of risks versus benefits of doing this given benefits for renal protection in diabetic patients. Patient was unclear why she was on beta blocker - we were tapering down on this to avoid rebound palpitations in case this was the reason for her treatment, today BP looks much better, HR high on intake but better on auscultation, see exam noted below  Psychiatric: Son-in-law had a lot of concerns about Seroquel side effects, we decided to stop this given sedating effects and possible weight gain - she had not stopped this medication as of last visit. We are planning on continuing Zoloft and amitriptyline. History of migraine and chronic pain is noted on problem list, amitriptyline likely helping this. Post traumatic stress syndrome is also noted on problem list. No history of mood disorder or dementia. She states insomnia has been a big issue for her. Having bad dreams lately and trouble sleeping, freaking out, increased anxiety. Would like to get back on the Seroquel.     Patient is NOT accompanied by anyone who can assist with history-taking. Son in law was with her at previous visit     Past medical, surgical, social and family history reviewed: Patient Active Problem List   Diagnosis Date Noted  . Hypotension 09/22/2016  . Osteopenia 05/24/2016  . Abdominal pain 04/12/2016  . Diverticulitis of colon 04/12/2016  . Broken or cracked tooth, nontraumatic 03/27/2016  . Memory deficit 05/05/2015  . Balance problems 05/05/2015  . Hearing difficulty of both ears 05/05/2015  . Mouth lesion 05/04/2015  . OSA (obstructive sleep apnea) 04/28/2015  . Seborrheic keratoses 01/31/2015  . Left-sided low back pain with left-sided sciatica 01/31/2015  . GERD (gastroesophageal reflux disease) 01/26/2015  . Celiac disease 01/26/2015  . Cervical spondylosis 12/04/2014  . Osteoarthritis of first metatarsophalangeal joint 11/17/2014  . Ingrown left big toenail 10/04/2014  . Seasonal allergies 10/04/2014  . Neuropathy 10/04/2014  . Toenail fungus 10/03/2014  . Adenomatous polyp of colon 07/30/2014  . Macular degeneration, dry 07/29/2014  . Type 2 diabetes mellitus with complication ([place]) 16/02/9603  . Migraine without aura and without status migrainosus, not intractable 07/28/2014  . EMPHYSEMA 05/11/2009  . COPD (chronic obstructive pulmonary disease) with chronic bronchitis ([place]) 05/10/2009  . VISION DISORDER 12/03/2008  . OSTEOPENIA 11/18/2008  . VITAMIN D DEFICIENCY 11/06/2008  . Major depressive disorder 11/05/2008  . FATIGUE 11/05/2008  . ANEMIA 10/23/2008  . DYSLIPIDEMIA 10/16/2008  . TOBACCO ABUSE 10/15/2008  . ALLERGIC RHINITIS 10/15/2008  . FRACTURE, ANKLE, RIGHT 04/29/2008  . DIARRHEA 07/01/2007  . POST  TRAUMATIC STRESS SYNDROME 05/30/2000  . Hypothyroidism 05/30/1988  . POSTMENOPAUSAL STATUS 05/30/1968   Past Surgical History:  Procedure Laterality Date  . APPENDECTOMY    . BREAST SURGERY     h/o benign cystleft breast and lymp node removal  on right breast area.  Marland Kitchen EYE SURGERY     Left eye cataract removal, right eye h/o macular degeneration.  Marland Kitchen SPINE SURGERY     Pt not sure of the type of surgery she had but knows it was L4-5.  Marland Kitchen VAGINAL HYSTERECTOMY     Social History  Substance Use Topics  . Smoking status: Current Every Day Smoker    Packs/day: 0.50    Years: 51.00    Types: Cigarettes    Start date: 01/02/2012  . Smokeless tobacco: Never Used  . Alcohol use No   Family History  Problem Relation Age of Onset  . Anxiety disorder Mother   . Depression Mother   . Heart attack Mother   . Stroke Mother   . Diabetes type II Mother   . Diabetes Father   . Glaucoma Father   . Hypertension Father   . COPD Father   . COPD Sister   . Hashimoto's thyroiditis Sister   . Cancer Sister   . Kidney disease Sister   . Drug abuse Brother   . Alcohol abuse Brother   . Asthma Son   . Diabetes Son      Current medication list and allergy/intolerance information reviewed:   Current Outpatient Prescriptions  Medication Sig Dispense Refill  . ACCU-CHEK AVIVA PLUS test strip test 1-2 TIMES daily 100 each 3  . albuterol (PROVENTIL HFA;VENTOLIN HFA) 108 (90 BASE) MCG/ACT inhaler Inhale 2 puffs into the lungs every 6 (six) hours as needed.    . AMBULATORY NON FORMULARY MEDICATION Glucometer, test strips and lancets for testing once a day for DM, uncontrolled. 100 strip 0  . amitriptyline (ELAVIL) 50 MG tablet take 1/2 tablet at bedtime for one week then increase to 1 (one) tablet at bedtime 90 tablet 3  . dicyclomine (BENTYL) 10 MG capsule Take 1 capsule by mouth three times a day as needed  5  . EPIPEN 2-PAK 0.3 MG/0.3ML SOAJ injection USE AS DIRECTED 1 Device 1  . ezetimibe (ZETIA) 10 MG tablet Take 1 tablet (10 mg total) by mouth daily. 90 tablet 3  . famotidine (PEPCID) 20 MG tablet Take 1 tablet (20 mg total) by mouth daily. 90 tablet 4  . Fluticasone-Salmeterol (ADVAIR) 250-50 MCG/DOSE AEPB Inhale 2 puffs into the lungs  every 12 (twelve) hours.    Marland Kitchen GLUCOSAMINE-CHONDROITIN PO Take by mouth.    . Insulin Degludec (TRESIBA FLEXTOUCH) 200 UNIT/ML SOPN Inject 10 Units into the skin at bedtime. Increase by 2 units every 5 days until fasting glucose 120. This is a 30 day supply., 4 pen 2  . levocetirizine (XYZAL) 5 MG tablet Take 5 mg by mouth every evening.    Marland Kitchen levothyroxine (SYNTHROID, LEVOTHROID) 137 MCG tablet Take 1 tablet (137 mcg total) by mouth daily before breakfast. When pills in the bottle or running low, will be due to recheck labs 60 tablet 0  . LYRICA 50 MG capsule TAKE ONE CAPSULE THREE TIMES DAILY 90 capsule 0  . metFORMIN (GLUCOPHAGE-XR) 750 MG 24 hr tablet Take 1 tablet (750 mg total) by mouth daily with breakfast. 90 tablet 1  . NAPROXEN SODIUM PO Take 220 mg by mouth 2 (two) times daily as needed.    Marland Kitchen  ondansetron (ZOFRAN) 8 MG tablet TAKE 1 TABLET BY MOUTH EVERY 8 HOURS AS NEEDED FOR NAUSEA AND VOMITING 20 tablet 2  . promethazine (PHENERGAN) 25 MG tablet Take 1 tablet (25 mg total) by mouth every 6 (six) hours as needed for nausea or vomiting. 30 tablet 0  . sertraline (ZOLOFT) 100 MG tablet Take 2 tablets (200 mg total) by mouth daily. 60 tablet 1  . simvastatin (ZOCOR) 40 MG tablet Take 1 tablet (40 mg total) by mouth at bedtime. 90 tablet 3  . triamcinolone cream (KENALOG) 0.1 % Apply 1 application topically 2 (two) times daily. For itchy bumps. 60 g 1   No current facility-administered medications for this visit.    Allergies  Allergen Reactions  . Atorvastatin   . Esomeprazole Magnesium   . Gluten Meal   . Ivp Dye [Iodinated Diagnostic Agents] Other (See Comments)    Per patient cardiac arrest  . Latex   . Penicillins   . Trazodone And Nefazodone Hives  . Zolpidem Tartrate Nausea And Vomiting      Review of Systems:  Constitutional:  No  fever, no chills, No unintentional weight changes. +significant fatigue.   HEENT: No  headache, no vision change  Cardiac: No  chest pain,  No  pressure, No palpitation  Respiratory:  No  shortness of breath. +Cough  Neurologic: No  weakness, No  dizziness,  Psychiatric: No  concerns with depression, +concerns with anxiety, +sleep problems, No mood problems  Exam:  BP 113/69   Pulse 95   Wt 200 lb (90.7 kg)   LMP 09/21/1974   BMI 35.43 kg/m   Constitutional: VS see above. General Appearance: alert, well-developed, well-nourished, NAD  Eyes: Normal lids and conjunctive, non-icteric sclera  Ears, Nose, Mouth, Throat: MMM, Normal external inspection ears/nares/mouth/lips/gums.   Neck: No masses, trachea midline.   Respiratory: Normal respiratory effort. no wheeze, no rhonchi, no rales  Cardiovascular: S1/S2 normal, no murmur, no rub/gallop auscultated. RRR.   Gastrointestinal: Nontender, no masses. No hepatomegaly, no splenomegaly.   Musculoskeletal: Gait normal. No clubbing/cyanosis of digits.   Neurological: Normal balance/coordination. No tremor.   Skin: warm, dry, intact. No rash/ulcer.  Psychiatric: Fair judgment/insight.    ASSESSMENT/PLAN:   Refilled COPD medications.  Tablet patient needs to be back on Seroquel, went ahead and put this back on her medication list, she has left over pills at home.  Mood problem - Plan: QUEtiapine (SEROQUEL XR) 200 MG 24 hr tablet  Mixed simple and mucopurulent chronic bronchitis (HCC)  Hypotension, unspecified hypotension type - Resolves once off medications, continue to monitor  Need for immunization against influenza - Plan: Flu vaccine HIGH DOSE PF      Visit summary with medication list and pertinent instructions was printed for patient to review. All questions at time of visit were answered - patient instructed to contact office with any additional concerns. ER/RTC precautions were reviewed with the patient. Follow-up plan: Return in about 4 weeks (around 02/17/2017) for recheck COPD, sleep, sugars. See me sooner if needed .  Total time spent 25 minutes.  Greater than 50% of the visit was face-to-face time counseling and coordinating care for the following diagnoses: The primary encounter diagnosis was Mood problem. Diagnoses of Mixed simple and mucopurulent chronic bronchitis ([place]), Hypotension, unspecified hypotension type, and Need for immunization against influenza were also pertinent to this visit.

## 2017-01-20 NOTE — Patient Instructions (Addendum)
   See if pharmacy can do pill packs to simplify taking your medicines, or you can use daily AM/PM pill containers.   Will refill inhalers - if phlegm continues to cause an issue, you can use guaifenesin as needed  Plan to come see me in a month to check up on how you're doing, but please make an appointment sooner if needed!

## 2017-01-20 NOTE — Telephone Encounter (Signed)
Ok with me 

## 2017-01-24 ENCOUNTER — Other Ambulatory Visit (HOSPITAL_COMMUNITY): Payer: Self-pay | Admitting: Osteopathic Medicine

## 2017-01-26 ENCOUNTER — Other Ambulatory Visit: Payer: Self-pay | Admitting: Physician Assistant

## 2017-02-17 ENCOUNTER — Other Ambulatory Visit (HOSPITAL_COMMUNITY): Payer: Self-pay | Admitting: Physician Assistant

## 2017-03-01 ENCOUNTER — Other Ambulatory Visit (HOSPITAL_COMMUNITY): Payer: Self-pay | Admitting: Osteopathic Medicine

## 2017-03-01 ENCOUNTER — Ambulatory Visit: Payer: Medicare HMO | Admitting: Osteopathic Medicine

## 2017-03-01 ENCOUNTER — Other Ambulatory Visit: Payer: Self-pay | Admitting: Physician Assistant

## 2017-03-01 DIAGNOSIS — Z0189 Encounter for other specified special examinations: Secondary | ICD-10-CM

## 2017-03-02 ENCOUNTER — Ambulatory Visit (INDEPENDENT_AMBULATORY_CARE_PROVIDER_SITE_OTHER): Payer: Medicare HMO | Admitting: Osteopathic Medicine

## 2017-03-02 ENCOUNTER — Encounter: Payer: Self-pay | Admitting: Osteopathic Medicine

## 2017-03-02 ENCOUNTER — Other Ambulatory Visit: Payer: Self-pay | Admitting: Osteopathic Medicine

## 2017-03-02 VITALS — BP 109/66 | HR 97 | Wt 201.0 lb

## 2017-03-02 DIAGNOSIS — I959 Hypotension, unspecified: Secondary | ICD-10-CM

## 2017-03-02 DIAGNOSIS — Z794 Long term (current) use of insulin: Secondary | ICD-10-CM

## 2017-03-02 DIAGNOSIS — E1169 Type 2 diabetes mellitus with other specified complication: Secondary | ICD-10-CM

## 2017-03-02 DIAGNOSIS — E039 Hypothyroidism, unspecified: Secondary | ICD-10-CM | POA: Diagnosis not present

## 2017-03-02 DIAGNOSIS — E785 Hyperlipidemia, unspecified: Secondary | ICD-10-CM | POA: Diagnosis not present

## 2017-03-02 DIAGNOSIS — F331 Major depressive disorder, recurrent, moderate: Secondary | ICD-10-CM

## 2017-03-02 DIAGNOSIS — Z79899 Other long term (current) drug therapy: Secondary | ICD-10-CM | POA: Diagnosis not present

## 2017-03-02 DIAGNOSIS — J449 Chronic obstructive pulmonary disease, unspecified: Secondary | ICD-10-CM | POA: Diagnosis not present

## 2017-03-02 LAB — TSH: TSH: 3.27 mIU/L (ref 0.40–4.50)

## 2017-03-02 MED ORDER — FAMOTIDINE 20 MG PO TABS: 20.0000 mg | ORAL_TABLET | Freq: Every day | ORAL | 3 refills | Status: DC

## 2017-03-02 MED ORDER — SERTRALINE HCL 100 MG PO TABS: 200.0000 mg | ORAL_TABLET | Freq: Every day | ORAL | 1 refills | Status: DC

## 2017-03-02 MED ORDER — LEVOTHYROXINE SODIUM 137 MCG PO TABS: 137.0000 ug | ORAL_TABLET | Freq: Every day | ORAL | 1 refills | Status: DC

## 2017-03-02 MED ORDER — AMITRIPTYLINE HCL 50 MG PO TABS: 50.0000 mg | ORAL_TABLET | Freq: Every day | ORAL | 1 refills | Status: DC

## 2017-03-02 MED ORDER — QUETIAPINE FUMARATE 200 MG PO TABS: 200.0000 mg | ORAL_TABLET | Freq: Every day | ORAL | 1 refills | Status: DC

## 2017-03-02 NOTE — Progress Notes (Signed)
HPI: Autumn Bowen is a 71 y.o. female  who presents to [facility] today, 03/02/17,  for chief complaint of:  Chief Complaint  Patient presents with  . Follow-up    COPD:  Last visit, inhalers were refilled. She is using these and feeling improvement.    Blood pressure: Due to hypotension concerns, we stopped lisinopril with discussion of risks versus benefits of doing this given benefits for renal protection in diabetic patients. Patient was unclear why she was on beta blocker - we were tapering down on this to avoid rebound palpitations in case this was the reason for her treatment, last visit BP was much better. Today BP as below, no dizziness or weakness. No palpitations.   Psychiatric: We initially decided to stop Seroquel given sedating effects and possible weight gain - she stopped but had difficulty sleeping so we decided to restart it. Since restarting has been doing better in terms of sleep. Significant stress with housing situation but overall moods are fairly well controlled.  We are planning on continuing Zoloft and amitriptyline. History of migraine and chronic pain is noted on problem list, amitriptyline likely helping this. Post traumatic stress syndrome is also noted on problem list. No history of mood disorder or dementia.    Diabetes: Reports fasting sugars at home in 100s. No hypoglycemia. Last A1C good, not due for repeat.   Hypothyroid: Due for recheck TSH     Past medical, surgical, social and family history reviewed: Patient Active Problem List   Diagnosis Date Noted  . Hypotension 09/22/2016  . Osteopenia 05/24/2016  . Abdominal pain 04/12/2016  . Diverticulitis of colon 04/12/2016  . Broken or cracked tooth, nontraumatic 03/27/2016  . Memory deficit 05/05/2015  . Balance problems 05/05/2015  . Hearing difficulty of both ears 05/05/2015  . Mouth lesion 05/04/2015  . OSA (obstructive sleep apnea) 04/28/2015  .  Seborrheic keratoses 01/31/2015  . Left-sided low back pain with left-sided sciatica 01/31/2015  . GERD (gastroesophageal reflux disease) 01/26/2015  . Celiac disease 01/26/2015  . Cervical spondylosis 12/04/2014  . Osteoarthritis of first metatarsophalangeal joint 11/17/2014  . Ingrown left big toenail 10/04/2014  . Seasonal allergies 10/04/2014  . Neuropathy 10/04/2014  . Toenail fungus 10/03/2014  . Adenomatous polyp of colon 07/30/2014  . Macular degeneration, dry 07/29/2014  . Type 2 diabetes mellitus with complication ([place]) 37/90/2409  . Migraine without aura and without status migrainosus, not intractable 07/28/2014  . EMPHYSEMA 05/11/2009  . COPD (chronic obstructive pulmonary disease) with chronic bronchitis ([place]) 05/10/2009  . VISION DISORDER 12/03/2008  . OSTEOPENIA 11/18/2008  . VITAMIN D DEFICIENCY 11/06/2008  . Major depressive disorder 11/05/2008  . FATIGUE 11/05/2008  . ANEMIA 10/23/2008  . DYSLIPIDEMIA 10/16/2008  . TOBACCO ABUSE 10/15/2008  . ALLERGIC RHINITIS 10/15/2008  . FRACTURE, ANKLE, RIGHT 04/29/2008  . DIARRHEA 07/01/2007  . POST TRAUMATIC STRESS SYNDROME 05/30/2000  . Hypothyroidism 05/30/1988  . POSTMENOPAUSAL STATUS 05/30/1968   Past Surgical History:  Procedure Laterality Date  . APPENDECTOMY    . BREAST SURGERY     h/o benign cystleft breast and lymp node removal on right breast area.  [facility] Kitchen EYE SURGERY     Left eye cataract removal, right eye h/o macular degeneration.  Marland Kitchen SPINE SURGERY     Pt not sure of the type of surgery she had but knows it was L4-5.  Marland Kitchen VAGINAL HYSTERECTOMY     Social History  Substance Use Topics  . Smoking status: Current Every  Day Smoker    Packs/day: 0.50    Years: 51.00    Types: Cigarettes    Start date: 01/02/2012  . Smokeless tobacco: Never Used  . Alcohol use No   Family History  Problem Relation Age of Onset  . Anxiety disorder Mother   . Depression Mother   . Heart attack Mother   . Stroke Mother   .  Diabetes type II Mother   . Diabetes Father   . Glaucoma Father   . Hypertension Father   . COPD Father   . COPD Sister   . Hashimoto's thyroiditis Sister   . Cancer Sister   . Kidney disease Sister   . Drug abuse Brother   . Alcohol abuse Brother   . Asthma Son   . Diabetes Son      Current medication list and allergy/intolerance information reviewed:   Current Outpatient Prescriptions  Medication Sig Dispense Refill  . ACCU-CHEK AVIVA PLUS test strip test 1-2 TIMES daily 100 each 11  . albuterol (PROVENTIL HFA;VENTOLIN HFA) 108 (90 Base) MCG/ACT inhaler Inhale 2 puffs into the lungs every 6 (six) hours as needed for wheezing or shortness of breath. 2 Inhaler 5  . AMBULATORY NON FORMULARY MEDICATION Glucometer, test strips and lancets for testing once a day for DM, uncontrolled. 100 strip 0  . amitriptyline (ELAVIL) 50 MG tablet take 1/2 tablet at bedtime for one week then increase to 1 (one) tablet at bedtime 90 tablet 0  . dicyclomine (BENTYL) 10 MG capsule Take 1 capsule by mouth three times a day as needed  5  . EPIPEN 2-PAK 0.3 MG/0.3ML SOAJ injection USE AS DIRECTED 1 Device 1  . ezetimibe (ZETIA) 10 MG tablet Take 1 tablet (10 mg total) by mouth daily. 90 tablet 3  . famotidine (PEPCID) 20 MG tablet Take 1 tablet (20 mg total) by mouth daily. 90 tablet 0  . Fluticasone-Salmeterol (ADVAIR) 250-50 MCG/DOSE AEPB Inhale 1 puff into the lungs every 12 (twelve) hours. 60 each 11  . GLUCOSAMINE-CHONDROITIN PO Take by mouth.    Marland Kitchen guaiFENesin (MUCINEX) 600 MG 12 hr tablet Take 1 tablet (600 mg total) by mouth 2 (two) times daily as needed for cough or to loosen phlegm. 60 tablet 11  . Insulin Degludec (TRESIBA FLEXTOUCH) 200 UNIT/ML SOPN Inject 10 Units into the skin at bedtime. Increase by 2 units every 5 days until fasting glucose 120. This is a 30 day supply., 4 pen 2  . levocetirizine (XYZAL) 5 MG tablet Take 5 mg by mouth every evening.    Marland Kitchen levothyroxine (SYNTHROID, LEVOTHROID)  137 MCG tablet Take 1 tablet (137 mcg total) by mouth daily before breakfast. When pills in the bottle or running low, will be due to recheck labs 60 tablet 0  . LYRICA 50 MG capsule TAKE ONE CAPSULE THREE TIMES DAILY 90 capsule 0  . metFORMIN (GLUCOPHAGE-XR) 750 MG 24 hr tablet Take 1 tablet (750 mg total) by mouth daily with breakfast. 90 tablet 1  . NAPROXEN SODIUM PO Take 220 mg by mouth 2 (two) times daily as needed.    . ondansetron (ZOFRAN) 8 MG tablet TAKE 1 TABLET BY MOUTH EVERY 8 HOURS AS NEEDED FOR NAUSEA AND VOMITING 20 tablet 2  . promethazine (PHENERGAN) 25 MG tablet Take 1 tablet (25 mg total) by mouth every 6 (six) hours as needed for nausea or vomiting. 30 tablet 0  . QUEtiapine (SEROQUEL XR) 200 MG 24 hr tablet Take 1 tablet (200 mg total)  by mouth at bedtime. 90 tablet 1  . QUEtiapine (SEROQUEL) 200 MG tablet Take 1 tablet (200 mg total) by mouth at bedtime. APPOINTMENT NEEDED FOR FURTHER REFILLS 30 tablet 0  . sertraline (ZOLOFT) 100 MG tablet TAKE 2 TABLETS DAILY 60 tablet 0  . simvastatin (ZOCOR) 40 MG tablet Take 1 tablet (40 mg total) by mouth at bedtime. 90 tablet 3  . triamcinolone cream (KENALOG) 0.1 % Apply 1 application topically 2 (two) times daily. For itchy bumps. 60 g 1   No current facility-administered medications for this visit.    Allergies  Allergen Reactions  . Atorvastatin   . Esomeprazole Magnesium   . Gluten Meal   . Ivp Dye [Iodinated Diagnostic Agents] Other (See Comments)    Per patient cardiac arrest  . Latex   . Penicillins   . Trazodone And Nefazodone Hives  . Zolpidem Tartrate Nausea And Vomiting      Review of Systems:  Constitutional:  No  fever, no chills, No unintentional weight changes. +significant fatigue.   HEENT: No  headache, no vision change  Cardiac: No  chest pain, No  pressure, No palpitation  Respiratory:  No  shortness of breath. +Cough  Neurologic: No  weakness, No  dizziness,  Psychiatric: No  concerns with  depression, +concerns with anxiety, +sleep problems, No mood problems  Exam:  BP 109/66   Pulse 97   Wt 201 lb (91.2 kg)   LMP 09/21/1974   BMI 35.61 kg/m   Constitutional: VS see above. General Appearance: alert, well-developed, well-nourished, NAD  Eyes: Normal lids and conjunctive, non-icteric sclera  Ears, Nose, Mouth, Throat: MMM, Normal external inspection ears/nares/mouth/lips/gums.   Neck: No masses, trachea midline.   Respiratory: Normal respiratory effort. no wheeze, no rhonchi, no rales  Cardiovascular: S1/S2 normal, no murmur, no rub/gallop auscultated. RRR.   Gastrointestinal: Nontender, no masses. No hepatomegaly, no splenomegaly.   Musculoskeletal: Gait normal. No clubbing/cyanosis of digits.   Neurological: Normal balance/coordination. No tremor.   Skin: warm, dry, intact. No rash/ulcer.  Psychiatric: Fair judgment/insight.    ASSESSMENT/PLAN:    Hypothyroidism, unspecified type - recheck TSH - Plan: TSH  Type 2 diabetes mellitus with other specified complication, with long-term current use of insulin (HCC) - stable for now, recheck A1C next visit  Hypotension, unspecified hypotension type  Hyperlipidemia associated with type 2 diabetes mellitus (HCC)  Medication management  Moderate episode of recurrent major depressive disorder ([place]) - (+)stressors lately but overall better/stable  Chronic obstructive pulmonary disease, unspecified COPD type ([place]) - Continue current meds    Patient Instructions  Plan:  Refills all taken care of - if further refills needed, please have [facility] contact our office   I think the medicines you're on now are doing well for you   We will be due to recheck diabetes next 6 weeks or so     Visit summary with medication list and pertinent instructions was printed for patient to review. All questions at time of visit were answered - patient instructed to contact office with any additional concerns. ER/RTC  precautions were reviewed with the patient. Follow-up plan: Return in about 6 weeks (around 04/13/2017) for recheck diabetes .  Total time spent 25 minutes. Greater than 50% of the visit was face-to-face time counseling and coordinating care for the following diagnoses: The primary encounter diagnosis was Hypothyroidism, unspecified type. Diagnoses of Type 2 diabetes mellitus with other specified complication, with long-term current use of insulin ([place]), Hypotension, unspecified hypotension type, Hyperlipidemia associated  with type 2 diabetes mellitus ([place]), Medication management, Moderate episode of recurrent major depressive disorder ([place]), and Chronic obstructive pulmonary disease, unspecified COPD type ([place]) were also pertinent to this visit.

## 2017-03-02 NOTE — Patient Instructions (Signed)
Plan:  Refills all taken care of - if further refills needed, please have [facility] contact our office   I think the medicines you're on now are doing well for you   We will be due to recheck diabetes next 6 weeks or so

## 2017-03-02 NOTE — Progress Notes (Signed)
Refill thyroid meds

## 2017-03-02 NOTE — Telephone Encounter (Signed)
Patient is requesting refill for Seroquel. Please advise if its ok to refill. Patient has appointment scheduled for today. Rhonda Cunningham,CMA

## 2017-03-02 NOTE — Telephone Encounter (Signed)
Taken care of at visit

## 2017-03-21 ENCOUNTER — Telehealth: Payer: Self-pay | Admitting: Osteopathic Medicine

## 2017-03-21 NOTE — Telephone Encounter (Signed)
10/23- Pt called stated keeps getting calls from our office, Researched acct couldn't find reason for call hung up confirmed 11/15/appt and then after hanging up seen Patrica call to reschedule because 40 min pt called pt back left vm for pt to call back moved pt up to 11:10am instead but pt still thinks arrival time 11:20.

## 2017-04-03 ENCOUNTER — Other Ambulatory Visit: Payer: Self-pay | Admitting: Osteopathic Medicine

## 2017-04-06 ENCOUNTER — Ambulatory Visit (INDEPENDENT_AMBULATORY_CARE_PROVIDER_SITE_OTHER): Payer: Medicare HMO | Admitting: Osteopathic Medicine

## 2017-04-06 ENCOUNTER — Telehealth: Payer: Self-pay

## 2017-04-06 ENCOUNTER — Encounter: Payer: Self-pay | Admitting: Osteopathic Medicine

## 2017-04-06 VITALS — BP 141/92 | HR 125 | Temp 98.1°F | Resp 20 | Wt 199.8 lb

## 2017-04-06 DIAGNOSIS — K0401 Reversible pulpitis: Secondary | ICD-10-CM | POA: Diagnosis not present

## 2017-04-06 DIAGNOSIS — K029 Dental caries, unspecified: Secondary | ICD-10-CM

## 2017-04-06 MED ORDER — LIDOCAINE VISCOUS HCL 2 % MT SOLN: 10.0000 mL | OROMUCOSAL | 1 refills | Status: DC | PRN

## 2017-04-06 MED ORDER — CLINDAMYCIN HCL 300 MG PO CAPS: 300.0000 mg | ORAL_CAPSULE | Freq: Three times a day (TID) | ORAL | 0 refills | Status: DC

## 2017-04-06 MED ORDER — ACETAMINOPHEN-CODEINE #3 300-30 MG PO TABS: 1.0000 | ORAL_TABLET | ORAL | 0 refills | Status: DC | PRN

## 2017-04-06 NOTE — Progress Notes (Signed)
HPI: Autumn Bowen is a 71 y.o. female who  has a past medical history of Allergy, Anemia, Celiac disease, Celiac disease, COPD (chronic obstructive pulmonary disease) ([place]), Diabetes mellitus type II, Diverticulitis, Dyslipidemia, Hypothyroidism, and Sore throat.  she presents to [facility] today, 04/06/17,  for chief complaint of:  Chief Complaint  Patient presents with  . Dental Pain    Severe teeth decay - top/bottom    Dental pain - having difficulty finding a dentist. She was told by several offices that they do not take her insurance, Medicare/Medicaid. When I spoke to her, she states she doesn't have dental insurance. At any rate, upper gum/teeth are showing severe decay and she is in a good bit of pain. No fever or chills. No sinus issues.  Tachycardia on intake, patient reports this is probably due to severe pain. No chest pain, no sensation of palpitations. Heart rate improved on auscultation   Past medical, surgical, social and family history reviewed:  Patient Active Problem List   Diagnosis Date Noted  . Hypotension 09/22/2016  . Osteopenia 05/24/2016  . Abdominal pain 04/12/2016  . Diverticulitis of colon 04/12/2016  . Broken or cracked tooth, nontraumatic 03/27/2016  . Memory deficit 05/05/2015  . Balance problems 05/05/2015  . Hearing difficulty of both ears 05/05/2015  . Mouth lesion 05/04/2015  . OSA (obstructive sleep apnea) 04/28/2015  . Seborrheic keratoses 01/31/2015  . Left-sided low back pain with left-sided sciatica 01/31/2015  . GERD (gastroesophageal reflux disease) 01/26/2015  . Celiac disease 01/26/2015  . Cervical spondylosis 12/04/2014  . Osteoarthritis of first metatarsophalangeal joint 11/17/2014  . Ingrown left big toenail 10/04/2014  . Seasonal allergies 10/04/2014  . Neuropathy 10/04/2014  . Toenail fungus 10/03/2014  . Adenomatous polyp of colon 07/30/2014  . Macular degeneration, dry 07/29/2014  .  Type 2 diabetes mellitus with complication ([place]) 17/49/4496  . Migraine without aura and without status migrainosus, not intractable 07/28/2014  . EMPHYSEMA 05/11/2009  . COPD (chronic obstructive pulmonary disease) with chronic bronchitis ([place]) 05/10/2009  . VISION DISORDER 12/03/2008  . OSTEOPENIA 11/18/2008  . VITAMIN D DEFICIENCY 11/06/2008  . Major depressive disorder 11/05/2008  . FATIGUE 11/05/2008  . ANEMIA 10/23/2008  . DYSLIPIDEMIA 10/16/2008  . TOBACCO ABUSE 10/15/2008  . ALLERGIC RHINITIS 10/15/2008  . FRACTURE, ANKLE, RIGHT 04/29/2008  . DIARRHEA 07/01/2007  . POST TRAUMATIC STRESS SYNDROME 05/30/2000  . Hypothyroidism 05/30/1988  . POSTMENOPAUSAL STATUS 05/30/1968    Past Surgical History:  Procedure Laterality Date  . APPENDECTOMY    . BREAST SURGERY     h/o benign cystleft breast and lymp node removal on right breast area.  Marland Kitchen EYE SURGERY     Left eye cataract removal, right eye h/o macular degeneration.  Marland Kitchen SPINE SURGERY     Pt not sure of the type of surgery she had but knows it was L4-5.  Marland Kitchen VAGINAL HYSTERECTOMY      Social History   Tobacco Use  . Smoking status: Current Every Day Smoker    Packs/day: 0.50    Years: 51.00    Pack years: 25.50    Types: Cigarettes    Start date: 01/02/2012  . Smokeless tobacco: Never Used  Substance Use Topics  . Alcohol use: No    Family History  Problem Relation Age of Onset  . Anxiety disorder Mother   . Depression Mother   . Heart attack Mother   . Stroke Mother   . Diabetes type II Mother   .  Diabetes Father   . Glaucoma Father   . Hypertension Father   . COPD Father   . COPD Sister   . Hashimoto's thyroiditis Sister   . Cancer Sister   . Kidney disease Sister   . Drug abuse Brother   . Alcohol abuse Brother   . Asthma Son   . Diabetes Son      Current medication list and allergy/intolerance information reviewed:    Current Outpatient Medications  Medication Sig Dispense Refill  . ACCU-CHEK  AVIVA PLUS test strip test 1-2 TIMES daily 100 each 11  . albuterol (PROVENTIL HFA;VENTOLIN HFA) 108 (90 Base) MCG/ACT inhaler Inhale 2 puffs into the lungs every 6 (six) hours as needed for wheezing or shortness of breath. 2 Inhaler 5  . AMBULATORY NON FORMULARY MEDICATION Glucometer, test strips and lancets for testing once a day for DM, uncontrolled. 100 strip 0  . amitriptyline (ELAVIL) 50 MG tablet Take 1 tablet (50 mg total) by mouth at bedtime. 90 tablet 1  . dicyclomine (BENTYL) 10 MG capsule Take 1 capsule by mouth three times a day as needed  5  . EPIPEN 2-PAK 0.3 MG/0.3ML SOAJ injection USE AS DIRECTED 1 Device 1  . ezetimibe (ZETIA) 10 MG tablet Take 1 tablet (10 mg total) by mouth daily. 90 tablet 3  . famotidine (PEPCID) 20 MG tablet Take 1 tablet (20 mg total) by mouth daily. 90 tablet 3  . Fluticasone-Salmeterol (ADVAIR) 250-50 MCG/DOSE AEPB Inhale 1 puff into the lungs every 12 (twelve) hours. 60 each 11  . GLUCOSAMINE-CHONDROITIN PO Take by mouth.    Marland Kitchen guaiFENesin (MUCINEX) 600 MG 12 hr tablet Take 1 tablet (600 mg total) by mouth 2 (two) times daily as needed for cough or to loosen phlegm. 60 tablet 11  . Insulin Degludec (TRESIBA FLEXTOUCH) 200 UNIT/ML SOPN Inject 10 Units into the skin at bedtime. Increase by 2 units every 5 days until fasting glucose 120. This is a 30 day supply., 4 pen 2  . levocetirizine (XYZAL) 5 MG tablet Take 5 mg by mouth every evening.    Marland Kitchen levothyroxine (SYNTHROID, LEVOTHROID) 137 MCG tablet Take 1 tablet (137 mcg total) by mouth daily before breakfast. 90 tablet 1  . LYRICA 50 MG capsule TAKE ONE CAPSULE THREE TIMES DAILY 90 capsule 0  . metFORMIN (GLUCOPHAGE-XR) 750 MG 24 hr tablet Take 1 tablet (750 mg total) by mouth daily with breakfast. 90 tablet 0  . NAPROXEN SODIUM PO Take 220 mg by mouth 2 (two) times daily as needed.    . ondansetron (ZOFRAN) 8 MG tablet TAKE 1 TABLET BY MOUTH EVERY 8 HOURS AS NEEDED FOR NAUSEA AND VOMITING 20 tablet 2  .  promethazine (PHENERGAN) 25 MG tablet Take 1 tablet (25 mg total) by mouth every 6 (six) hours as needed for nausea or vomiting. 30 tablet 0  . QUEtiapine (SEROQUEL) 200 MG tablet Take 1 tablet (200 mg total) by mouth at bedtime. 90 tablet 1  . sertraline (ZOLOFT) 100 MG tablet Take 2 tablets (200 mg total) by mouth daily. 120 tablet 1  . simvastatin (ZOCOR) 40 MG tablet Take 1 tablet (40 mg total) by mouth at bedtime. 90 tablet 3  . triamcinolone cream (KENALOG) 0.1 % Apply 1 application topically 2 (two) times daily. For itchy bumps. 60 g 1   No current facility-administered medications for this visit.     Allergies  Allergen Reactions  . Atorvastatin   . Esomeprazole Magnesium   . Gluten Meal   .  Ivp Dye [Iodinated Diagnostic Agents] Other (See Comments)    Per patient cardiac arrest  . Latex   . Penicillins   . Trazodone And Nefazodone Hives  . Zolpidem Tartrate Nausea And Vomiting      Review of Systems:  Constitutional:  No  fever, no chills, No recent illness,  HEENT: No  headache, no vision change, + dental issues as per history of present illness  Cardiac: No  chest pain, No  pressure, No palpitations  Respiratory:  No  shortness of breath. No  Cough  Musculoskeletal: No new myalgia/arthralgia  Skin: No  Rash  Exam:  BP (!) 141/92 (BP Location: Left Arm, Patient Position: Sitting, Cuff Size: Large)   Pulse (!) 125   Temp 98.1 F (36.7 C) (Oral)   Resp 20   Wt 199 lb 12.8 oz (90.6 kg)   LMP 09/21/1974   SpO2 97%   BMI 35.39 kg/m   Constitutional: VS see above. General Appearance: alert, well-developed, well-nourished, NAD  Eyes: Normal lids and conjunctive, non-icteric sclera  Ears, Nose, Mouth, Throat: MMM, Normal external inspection ears/nares/mouth/lips/gums. Pharynx/tonsils no erythema, no exudate. Nasal mucosa normal. Significant tooth decay, worse on left upper gum, mild erythema but no appreciable abscess  Neck: No masses, trachea midline. No  thyroid enlargement. No tenderness/mass appreciated. No lymphadenopathy  Respiratory: Normal respiratory effort. no wheeze, no rhonchi, no rales  Cardiovascular: S1/S2 normal, no murmur, no rub/gallop auscultated. RRR - heart rate about 95-100. No lower extremity edema.   Skin: warm, dry, intact.     ASSESSMENT/PLAN: The primary encounter diagnosis was Dental caries. A diagnosis of Pulpitis was also pertinent to this visit.  Meds ordered this encounter  Medications  . clindamycin (CLEOCIN) 300 MG capsule - given d/t PCN allergy    Sig: Take 1 capsule (300 mg total) 3 (three) times daily by mouth.    Dispense:  15 capsule    Refill:  0  . Lidocaine HCl 2 % SOLN    Sig: Use as directed 10-15 mLs every 3 (three) hours as needed in the mouth or throat (mouth/throat pain).    Dispense:  100 mL    Refill:  1  . acetaminophen-codeine (TYLENOL #3) 300-30 MG tablet    Sig: Take 1 tablet every 4 (four) hours as needed by mouth for moderate pain or severe pain.    Dispense:  30 tablet    Refill:  0    Patient Instructions   This needs a dentist within the week! I will not be managing this past today - we have ordered referral for a dentist, hopefully this will help  I've prescribed topical pain gel (lidocaine) to use directly on the gums/teeth - can soak a cotton ball and bite this. May make your tongue numb if you get it on the tongue.   I've also sent antibiotics and given a written prescription for Tylenol #3.     Visit summary with medication list and pertinent instructions was printed for patient to review. All questions at time of visit were answered - patient instructed to contact office with any additional concerns. ER/RTC precautions were reviewed with the patient. Follow-up plan: Return if symptoms worsen or fail to improve.   Please note: voice recognition software was used to produce this document, and typos may escape review. Please contact Dr. Sheppard Coil for any needed  clarifications.

## 2017-04-06 NOTE — Telephone Encounter (Signed)
That's fine, I figured that would be the case and I discussed this with the patient. Thanks for your help!

## 2017-04-06 NOTE — Telephone Encounter (Signed)
Patient called her insurance company and she does not have dental coverage. We are unable to proceed with referral.

## 2017-04-06 NOTE — Patient Instructions (Signed)
   This needs a dentist within the week! I will not be managing this past today - we have ordered referral for a dentist, hopefully this will help  I've prescribed topical pain gel (lidocaine) to use directly on the gums/teeth - can soak a cotton ball and bite this. May make your tongue numb if you get it on the tongue.   I've also sent antibiotics and given a written prescription for Tylenol #3.

## 2017-04-13 ENCOUNTER — Encounter: Payer: Self-pay | Admitting: Osteopathic Medicine

## 2017-04-13 ENCOUNTER — Ambulatory Visit (INDEPENDENT_AMBULATORY_CARE_PROVIDER_SITE_OTHER): Payer: Medicare HMO | Admitting: Osteopathic Medicine

## 2017-04-13 VITALS — BP 124/74 | HR 98 | Temp 98.1°F | Wt 200.0 lb

## 2017-04-13 DIAGNOSIS — R109 Unspecified abdominal pain: Secondary | ICD-10-CM | POA: Diagnosis not present

## 2017-04-13 DIAGNOSIS — G629 Polyneuropathy, unspecified: Secondary | ICD-10-CM | POA: Diagnosis not present

## 2017-04-13 DIAGNOSIS — E119 Type 2 diabetes mellitus without complications: Secondary | ICD-10-CM

## 2017-04-13 LAB — POCT GLYCOSYLATED HEMOGLOBIN (HGB A1C): Hemoglobin A1C: 8

## 2017-04-13 MED ORDER — PREGABALIN 50 MG PO CAPS: 50.0000 mg | ORAL_CAPSULE | Freq: Two times a day (BID) | ORAL | 1 refills | Status: DC

## 2017-04-13 MED ORDER — DICYCLOMINE HCL 10 MG PO CAPS: 10.0000 mg | ORAL_CAPSULE | Freq: Two times a day (BID) | ORAL | 3 refills | Status: DC

## 2017-04-13 NOTE — Progress Notes (Signed)
HPI: Autumn Bowen is a 71 y.o. female  who presents to [place] today, 04/13/17,  for chief complaint of:  Chief Complaint  Patient presents with  . Follow-up    diabetes     Diabetes: Reports fasting sugars at home in 100s. No hypoglycemia. Last A1C good, due for repeat. Today indicates good control. Due for eye exam. Hx neuropathy, needs refill on Lyrica.   IBS:  Needs refill on Bentyl.   Blood pressure: Due to hypotension concerns, we stopped lisinopril with discussion of risks versus benefits of doing this given benefits for renal protection in diabetic patients. Patient was unclear why she was on beta blocker - we were tapering down on this to avoid rebound palpitations in case this was the reason for her treatment, BP has been better w/o dizziness or weakness. No palpitations.   Psychiatric: Reprots good mood today. We initially decided to stop Seroquel given sedating effects and possible weight gain - she stopped but had difficulty sleeping so we decided to restart it. Since restarting has been doing better in terms of sleep. Significant stress with housing situation but overall moods are fairly well controlled.  We are planning on continuing Zoloft and amitriptyline. History of migraine and chronic pain is noted on problem list, amitriptyline likely helping this. Post traumatic stress syndrome is also noted on problem list. No history of mood disorder or dementia.       Past medical, surgical, social and family history reviewed: Patient Active Problem List   Diagnosis Date Noted  . Hypotension 09/22/2016  . Osteopenia 05/24/2016  . Abdominal pain 04/12/2016  . Diverticulitis of colon 04/12/2016  . Broken or cracked tooth, nontraumatic 03/27/2016  . Memory deficit 05/05/2015  . Balance problems 05/05/2015  . Hearing difficulty of both ears 05/05/2015  . Mouth lesion 05/04/2015  . OSA (obstructive sleep apnea) 04/28/2015  .  Seborrheic keratoses 01/31/2015  . Left-sided low back pain with left-sided sciatica 01/31/2015  . GERD (gastroesophageal reflux disease) 01/26/2015  . Celiac disease 01/26/2015  . Cervical spondylosis 12/04/2014  . Osteoarthritis of first metatarsophalangeal joint 11/17/2014  . Ingrown left big toenail 10/04/2014  . Seasonal allergies 10/04/2014  . Neuropathy 10/04/2014  . Toenail fungus 10/03/2014  . Adenomatous polyp of colon 07/30/2014  . Macular degeneration, dry 07/29/2014  . Type 2 diabetes mellitus with complication ([place]) 00/37/0488  . Migraine without aura and without status migrainosus, not intractable 07/28/2014  . EMPHYSEMA 05/11/2009  . COPD (chronic obstructive pulmonary disease) with chronic bronchitis ([place]) 05/10/2009  . VISION DISORDER 12/03/2008  . OSTEOPENIA 11/18/2008  . VITAMIN D DEFICIENCY 11/06/2008  . Major depressive disorder 11/05/2008  . FATIGUE 11/05/2008  . ANEMIA 10/23/2008  . DYSLIPIDEMIA 10/16/2008  . TOBACCO ABUSE 10/15/2008  . ALLERGIC RHINITIS 10/15/2008  . FRACTURE, ANKLE, RIGHT 04/29/2008  . DIARRHEA 07/01/2007  . POST TRAUMATIC STRESS SYNDROME 05/30/2000  . Hypothyroidism 05/30/1988  . POSTMENOPAUSAL STATUS 05/30/1968   Past Surgical History:  Procedure Laterality Date  . APPENDECTOMY    . BREAST SURGERY     h/o benign cystleft breast and lymp node removal on right breast area.  Marland Kitchen EYE SURGERY     Left eye cataract removal, right eye h/o macular degeneration.  Marland Kitchen SPINE SURGERY     Pt not sure of the type of surgery she had but knows it was L4-5.  Marland Kitchen VAGINAL HYSTERECTOMY     Social History   Tobacco Use  . Smoking status: Current  Every Day Smoker    Packs/day: 0.50    Years: 51.00    Pack years: 25.50    Types: Cigarettes    Start date: 01/02/2012  . Smokeless tobacco: Never Used  Substance Use Topics  . Alcohol use: No      Current medication list and allergy/intolerance information reviewed:   Current Outpatient Medications   Medication Sig Dispense Refill  . ACCU-CHEK AVIVA PLUS test strip test 1-2 TIMES daily 100 each 11  . acetaminophen-codeine (TYLENOL #3) 300-30 MG tablet Take 1 tablet every 4 (four) hours as needed by mouth for moderate pain or severe pain. 30 tablet 0  . albuterol (PROVENTIL HFA;VENTOLIN HFA) 108 (90 Base) MCG/ACT inhaler Inhale 2 puffs into the lungs every 6 (six) hours as needed for wheezing or shortness of breath. 2 Inhaler 5  . AMBULATORY NON FORMULARY MEDICATION Glucometer, test strips and lancets for testing once a day for DM, uncontrolled. 100 strip 0  . amitriptyline (ELAVIL) 50 MG tablet Take 1 tablet (50 mg total) by mouth at bedtime. 90 tablet 1  . clindamycin (CLEOCIN) 300 MG capsule Take 1 capsule (300 mg total) 3 (three) times daily by mouth. 15 capsule 0  . dicyclomine (BENTYL) 10 MG capsule Take 1 capsule by mouth three times a day as needed  5  . EPIPEN 2-PAK 0.3 MG/0.3ML SOAJ injection USE AS DIRECTED 1 Device 1  . ezetimibe (ZETIA) 10 MG tablet Take 1 tablet (10 mg total) by mouth daily. 90 tablet 3  . famotidine (PEPCID) 20 MG tablet Take 1 tablet (20 mg total) by mouth daily. 90 tablet 3  . Fluticasone-Salmeterol (ADVAIR) 250-50 MCG/DOSE AEPB Inhale 1 puff into the lungs every 12 (twelve) hours. 60 each 11  . GLUCOSAMINE-CHONDROITIN PO Take by mouth.    . Insulin Degludec (TRESIBA FLEXTOUCH) 200 UNIT/ML SOPN Inject 10 Units into the skin at bedtime. Increase by 2 units every 5 days until fasting glucose 120. This is a 30 day supply., 4 pen 2  . levocetirizine (XYZAL) 5 MG tablet Take 5 mg by mouth every evening.    Marland Kitchen levothyroxine (SYNTHROID, LEVOTHROID) 137 MCG tablet Take 1 tablet (137 mcg total) by mouth daily before breakfast. 90 tablet 1  . Lidocaine HCl 2 % SOLN Use as directed 10-15 mLs every 3 (three) hours as needed in the mouth or throat (mouth/throat pain). 100 mL 1  . LYRICA 50 MG capsule TAKE ONE CAPSULE THREE TIMES DAILY 90 capsule 0  . metFORMIN  (GLUCOPHAGE-XR) 750 MG 24 hr tablet Take 1 tablet (750 mg total) by mouth daily with breakfast. 90 tablet 0  . NAPROXEN SODIUM PO Take 220 mg by mouth 2 (two) times daily as needed.    . ondansetron (ZOFRAN) 8 MG tablet TAKE 1 TABLET BY MOUTH EVERY 8 HOURS AS NEEDED FOR NAUSEA AND VOMITING 20 tablet 2  . promethazine (PHENERGAN) 25 MG tablet Take 1 tablet (25 mg total) by mouth every 6 (six) hours as needed for nausea or vomiting. 30 tablet 0  . QUEtiapine (SEROQUEL) 200 MG tablet Take 1 tablet (200 mg total) by mouth at bedtime. 90 tablet 1  . sertraline (ZOLOFT) 100 MG tablet Take 2 tablets (200 mg total) by mouth daily. 120 tablet 1  . simvastatin (ZOCOR) 40 MG tablet Take 1 tablet (40 mg total) by mouth at bedtime. 90 tablet 3  . triamcinolone cream (KENALOG) 0.1 % Apply 1 application topically 2 (two) times daily. For itchy bumps. 60 g 1  No current facility-administered medications for this visit.    Allergies  Allergen Reactions  . Atorvastatin   . Esomeprazole Magnesium   . Gluten Meal   . Ivp Dye [Iodinated Diagnostic Agents] Other (See Comments)    Per patient cardiac arrest  . Latex   . Penicillins   . Trazodone And Nefazodone Hives  . Zolpidem Tartrate Nausea And Vomiting      Review of Systems:  Constitutional:  No  fever, no chills, No unintentional weight changes. +significant fatigue - stable  HEENT: No  headache, no vision change  Cardiac: No  chest pain, No  pressure, No palpitation  Respiratory:  No  shortness of breath. +Cough  Neurologic: No  weakness, No  dizziness,  Psychiatric: No  concerns with depression, +concerns with anxiety, No sleep problems, No mood problems  Exam:  BP 124/74   Pulse 98   Temp 98.1 F (36.7 C) (Oral)   Wt 200 lb (90.7 kg)   LMP 09/21/1974   BMI 35.43 kg/m   Constitutional: VS see above. General Appearance: alert, well-developed, well-nourished, NAD  Eyes: Normal lids and conjunctive, non-icteric sclera  Ears, Nose,  Mouth, Throat: MMM, Normal external inspection ears/nares/mouth/lips/gums.   Neck: No masses, trachea midline.   Respiratory: Normal respiratory effort. no wheeze, no rhonchi, no rales  Cardiovascular: S1/S2 normal, no murmur, no rub/gallop auscultated. RRR.   Gastrointestinal: Nontender, no masses. No hepatomegaly, no splenomegaly.   Musculoskeletal: Gait normal. No clubbing/cyanosis of digits.   Neurological: Normal balance/coordination. No tremor.   Skin: warm, dry, intact. No rash/ulcer.  Psychiatric: Fair judgment/insight.    Results for orders placed or performed in visit on 04/13/17 (from the past 24 hour(s))  POCT HgB A1C     Status: None   Collection Time: 04/13/17 11:18 AM  Result Value Ref Range   Hemoglobin A1C 8.0      ASSESSMENT/PLAN: Doing well today. Reviewed few chronic issues, everything stable/doing well. Pt feeling well except dental issue, she is going to dentist tomorrow.    Diabetes mellitus without complication ([place]) - Plan: POCT HgB A1C  Neuropathy - Plan: pregabalin (LYRICA) 50 MG capsule  Abdominal spasms - Plan: dicyclomine (BENTYL) 10 MG capsule      Visit summary with medication list and pertinent instructions was printed for patient to review. All questions at time of visit were answered - patient instructed to contact office with any additional concerns. ER/RTC precautions were reviewed with the patient. Follow-up plan: Return in about 3 months (around 07/14/2017) for recheck sugars, sooner if needed .  Total time spent 25 minutes. Greater than 50% of the visit was face-to-face time counseling and coordinating care for the following diagnoses: The encounter diagnosis was Diabetes mellitus without complication ([place]).

## 2017-04-25 ENCOUNTER — Other Ambulatory Visit: Payer: Self-pay | Admitting: Osteopathic Medicine

## 2017-05-12 ENCOUNTER — Encounter: Payer: Self-pay | Admitting: Physician Assistant

## 2017-05-12 ENCOUNTER — Ambulatory Visit (INDEPENDENT_AMBULATORY_CARE_PROVIDER_SITE_OTHER): Payer: Medicare HMO | Admitting: Physician Assistant

## 2017-05-12 VITALS — BP 145/72 | HR 97 | Temp 98.1°F | Ht 63.0 in | Wt 203.0 lb

## 2017-05-12 DIAGNOSIS — R05 Cough: Secondary | ICD-10-CM | POA: Diagnosis not present

## 2017-05-12 DIAGNOSIS — J0100 Acute maxillary sinusitis, unspecified: Secondary | ICD-10-CM

## 2017-05-12 DIAGNOSIS — R059 Cough, unspecified: Secondary | ICD-10-CM

## 2017-05-12 MED ORDER — HYDROCODONE-HOMATROPINE 5-1.5 MG/5ML PO SYRP: 5.0000 mL | ORAL_SOLUTION | Freq: Every evening | ORAL | 0 refills | Status: DC | PRN

## 2017-05-12 MED ORDER — DOXYCYCLINE HYCLATE 100 MG PO TABS: 100.0000 mg | ORAL_TABLET | Freq: Two times a day (BID) | ORAL | 0 refills | Status: DC

## 2017-05-12 NOTE — Patient Instructions (Signed)

## 2017-05-12 NOTE — Progress Notes (Signed)
Subjective:    Patient ID: Autumn Bowen, female    DOB: April 07, 1946, 71 y.o.   MRN: [id number]  HPI  Pt is a 71 yo female with COPD who presents to the clinic with cough, sinus pressure, facial pain, ST, ear popping, headaches for over a week. She does need multiple teeth removed due to cavities and has scheduled app for January 7th. Her teeth are worst on the right side and that is where more of her facial pain and pressure are. Her cough is what is the most bothersome. Her ribs ache from coughing so much. She has tried mucinex and chlorseptic. She feels like she is wheezing some but not using adviar or albuterol.    .. Active Ambulatory Problems    Diagnosis Date Noted  . Hypothyroidism 05/30/1988  . VITAMIN D DEFICIENCY 11/06/2008  . DYSLIPIDEMIA 10/16/2008  . ANEMIA 10/23/2008  . Major depressive disorder 11/05/2008  . TOBACCO ABUSE 10/15/2008  . POST TRAUMATIC STRESS SYNDROME 05/30/2000  . VISION DISORDER 12/03/2008  . ALLERGIC RHINITIS 10/15/2008  . EMPHYSEMA 05/11/2009  . COPD (chronic obstructive pulmonary disease) with chronic bronchitis ([place]) 05/10/2009  . OSTEOPENIA 11/18/2008  . FATIGUE 11/05/2008  . DIARRHEA 07/01/2007  . FRACTURE, ANKLE, RIGHT 04/29/2008  . POSTMENOPAUSAL STATUS 05/30/1968  . Type 2 diabetes mellitus with complication ([place]) 23/30/0762  . Migraine without aura and without status migrainosus, not intractable 07/28/2014  . Macular degeneration, dry 07/29/2014  . Adenomatous polyp of colon 07/30/2014  . Toenail fungus 10/03/2014  . Ingrown left big toenail 10/04/2014  . Seasonal allergies 10/04/2014  . Neuropathy 10/04/2014  . Osteoarthritis of first metatarsophalangeal joint 11/17/2014  . Cervical spondylosis 12/04/2014  . GERD (gastroesophageal reflux disease) 01/26/2015  . Celiac disease 01/26/2015  . Seborrheic keratoses 01/31/2015  . Left-sided low back pain with left-sided sciatica 01/31/2015  . OSA (obstructive sleep apnea) 04/28/2015  .  Mouth lesion 05/04/2015  . Memory deficit 05/05/2015  . Balance problems 05/05/2015  . Hearing difficulty of both ears 05/05/2015  . Broken or cracked tooth, nontraumatic 03/27/2016  . Abdominal pain 04/12/2016  . Diverticulitis of colon 04/12/2016  . Osteopenia 05/24/2016  . Hypotension 09/22/2016   Resolved Ambulatory Problems    Diagnosis Date Noted  . Tobacco abuse 01/26/2015  . Thyroid activity decreased 01/31/2015   Past Medical History:  Diagnosis Date  . Allergy   . Anemia   . Celiac disease   . Celiac disease   . COPD (chronic obstructive pulmonary disease) ([place])   . Diabetes mellitus type II   . Diverticulitis   . Dyslipidemia   . Hypothyroidism   . Sore throat      Review of Systems See HPI.     Objective:   Physical Exam  Constitutional: She appears well-developed and well-nourished.  HENT:  Head: Normocephalic and atraumatic.  Right Ear: External ear normal.  Left Ear: External ear normal.  TM's clear bilaterally.  Oropharynx erythematous without tonsilar swelling or exudate.  Tenderness over right sided maxillary sinuses to palpation.  Nasal turbaintes red and swollen.   Very poor dentition. No active abscess seen. Multiple decaying teeth.   Eyes: Conjunctivae are normal.  Neck: Normal range of motion. Neck supple.  Right sided cervical adenopathy. Non tender.   Cardiovascular: Normal rate, regular rhythm and normal heart sounds.  Pulmonary/Chest: Effort normal and breath sounds normal. She has no wheezes.  Psychiatric: She has a normal mood and affect. Her behavior is normal.  Assessment & Plan:  .Marland KitchenGlori was seen today for cough and headache.  Diagnoses and all orders for this visit:  Acute non-recurrent maxillary sinusitis -     doxycycline (VIBRA-TABS) 100 MG tablet; Take 1 tablet (100 mg total) by mouth 2 (two) times daily. For 10 days.  Cough -     HYDROcodone-homatropine (HYCODAN) 5-1.5 MG/5ML syrup; Take 5 mLs by mouth  at bedtime as needed.   Discussed with patient her lungs sounded really good. I believe cough is coming from sinus drainage. She is tender over sinuses as well as hx of dental infections. She is scheduled to have multiple teeth removed in January. Doxycycline given for sinusitis. Discussed other symptomatic care. Hycodan given for cough only at night at patient's request.   [place] controlled substance database reviewed with no concerns.

## 2017-05-14 ENCOUNTER — Encounter: Payer: Self-pay | Admitting: Physician Assistant

## 2017-05-17 ENCOUNTER — Other Ambulatory Visit: Payer: Self-pay | Admitting: Physician Assistant

## 2017-05-17 ENCOUNTER — Other Ambulatory Visit: Payer: Self-pay | Admitting: Osteopathic Medicine

## 2017-05-17 DIAGNOSIS — J0100 Acute maxillary sinusitis, unspecified: Secondary | ICD-10-CM

## 2017-06-05 DIAGNOSIS — R69 Illness, unspecified: Secondary | ICD-10-CM | POA: Diagnosis not present

## 2017-06-10 ENCOUNTER — Other Ambulatory Visit: Payer: Self-pay | Admitting: Osteopathic Medicine

## 2017-07-11 ENCOUNTER — Ambulatory Visit (INDEPENDENT_AMBULATORY_CARE_PROVIDER_SITE_OTHER): Payer: Medicare HMO | Admitting: Osteopathic Medicine

## 2017-07-11 ENCOUNTER — Encounter: Payer: Self-pay | Admitting: Osteopathic Medicine

## 2017-07-11 VITALS — BP 132/81 | HR 93 | Temp 98.1°F | Wt 207.3 lb

## 2017-07-11 DIAGNOSIS — Z794 Long term (current) use of insulin: Secondary | ICD-10-CM | POA: Diagnosis not present

## 2017-07-11 DIAGNOSIS — E1142 Type 2 diabetes mellitus with diabetic polyneuropathy: Secondary | ICD-10-CM | POA: Diagnosis not present

## 2017-07-11 DIAGNOSIS — I959 Hypotension, unspecified: Secondary | ICD-10-CM

## 2017-07-11 DIAGNOSIS — F17200 Nicotine dependence, unspecified, uncomplicated: Secondary | ICD-10-CM | POA: Diagnosis not present

## 2017-07-11 DIAGNOSIS — K0401 Reversible pulpitis: Secondary | ICD-10-CM | POA: Diagnosis not present

## 2017-07-11 LAB — POCT GLYCOSYLATED HEMOGLOBIN (HGB A1C): Hemoglobin A1C: 8

## 2017-07-11 MED ORDER — VARENICLINE TARTRATE 1 MG PO TABS: 1.0000 mg | ORAL_TABLET | Freq: Two times a day (BID) | ORAL | 1 refills | Status: DC

## 2017-07-11 MED ORDER — VARENICLINE TARTRATE 0.5 MG X 11 & 1 MG X 42 PO MISC: ORAL | 0 refills | Status: DC

## 2017-07-11 NOTE — Progress Notes (Signed)
HPI: Autumn Bowen is a 72 y.o. female  who presents to [facility] today, 07/11/17,  for chief complaint of:  DM f/u BP f/u Dental concerns   Diabetes: Reports fasting sugars at home in 100s. No hypoglycemia. Last A1C good, due for repeat. Today indicates good control. Due for eye exam. Hx neuropathy, needs refill on Lyrica.   Blood pressure: Due to hypotension concerns, we a few months ago stopped lisinopril with discussion of risks versus benefits of doing this given benefits for renal protection in diabetic patients. BP has been better w/o dizziness or weakness. No palpitations.   Dental issues: Some questions about recent dental procedure, had to reschedule some things a few times with the bad weather recently.  Had to have some teeth pulled.  Gums are sore.   Past medical, surgical, social and family history reviewed: Patient Active Problem List   Diagnosis Date Noted  . Hypotension 09/22/2016  . Osteopenia 05/24/2016  . Abdominal pain 04/12/2016  . Diverticulitis of colon 04/12/2016  . Broken or cracked tooth, nontraumatic 03/27/2016  . Memory deficit 05/05/2015  . Balance problems 05/05/2015  . Hearing difficulty of both ears 05/05/2015  . Mouth lesion 05/04/2015  . OSA (obstructive sleep apnea) 04/28/2015  . Seborrheic keratoses 01/31/2015  . Left-sided low back pain with left-sided sciatica 01/31/2015  . GERD (gastroesophageal reflux disease) 01/26/2015  . Celiac disease 01/26/2015  . Cervical spondylosis 12/04/2014  . Osteoarthritis of first metatarsophalangeal joint 11/17/2014  . Ingrown left big toenail 10/04/2014  . Seasonal allergies 10/04/2014  . Neuropathy 10/04/2014  . Toenail fungus 10/03/2014  . Adenomatous polyp of colon 07/30/2014  . Macular degeneration, dry 07/29/2014  . Type 2 diabetes mellitus with complication ([place]) 01/74/9449  . Migraine without aura and without status migrainosus, not intractable 07/28/2014   . EMPHYSEMA 05/11/2009  . COPD (chronic obstructive pulmonary disease) with chronic bronchitis ([place]) 05/10/2009  . VISION DISORDER 12/03/2008  . OSTEOPENIA 11/18/2008  . VITAMIN D DEFICIENCY 11/06/2008  . Major depressive disorder 11/05/2008  . FATIGUE 11/05/2008  . ANEMIA 10/23/2008  . DYSLIPIDEMIA 10/16/2008  . TOBACCO ABUSE 10/15/2008  . ALLERGIC RHINITIS 10/15/2008  . FRACTURE, ANKLE, RIGHT 04/29/2008  . DIARRHEA 07/01/2007  . POST TRAUMATIC STRESS SYNDROME 05/30/2000  . Hypothyroidism 05/30/1988  . POSTMENOPAUSAL STATUS 05/30/1968   Past Surgical History:  Procedure Laterality Date  . APPENDECTOMY    . BREAST SURGERY     h/o benign cystleft breast and lymp node removal on right breast area.  Marland Kitchen EYE SURGERY     Left eye cataract removal, right eye h/o macular degeneration.  Marland Kitchen SPINE SURGERY     Pt not sure of the type of surgery she had but knows it was L4-5.  Marland Kitchen VAGINAL HYSTERECTOMY     Social History   Tobacco Use  . Smoking status: Current Every Day Smoker    Packs/day: 0.50    Years: 51.00    Pack years: 25.50    Types: Cigarettes    Start date: 01/02/2012  . Smokeless tobacco: Never Used  Substance Use Topics  . Alcohol use: No      Current medication list and allergy/intolerance information reviewed:   Current Outpatient Medications  Medication Sig Dispense Refill  . ACCU-CHEK AVIVA PLUS test strip test 1-2 TIMES daily 100 each 11  . acetaminophen-codeine (TYLENOL #3) 300-30 MG tablet Take 1 tablet every 4 (four) hours as needed by mouth for moderate pain or severe pain. 30 tablet  0  . albuterol (PROVENTIL HFA;VENTOLIN HFA) 108 (90 Base) MCG/ACT inhaler Inhale 2 puffs into the lungs every 6 (six) hours as needed for wheezing or shortness of breath. 2 Inhaler 5  . AMBULATORY NON FORMULARY MEDICATION Glucometer, test strips and lancets for testing once a day for DM, uncontrolled. 100 strip 0  . amitriptyline (ELAVIL) 50 MG tablet Take 1 tablet (50 mg total) by  mouth at bedtime. 90 tablet 1  . clindamycin (CLEOCIN) 300 MG capsule Take 1 capsule (300 mg total) 3 (three) times daily by mouth. 15 capsule 0  . dicyclomine (BENTYL) 10 MG capsule Take 1 capsule (10 mg total) 2 (two) times daily by mouth. 180 capsule 3  . doxycycline (VIBRA-TABS) 100 MG tablet Take 1 tablet (100 mg total) by mouth 2 (two) times daily. For 10 days. 20 tablet 0  . EPIPEN 2-PAK 0.3 MG/0.3ML SOAJ injection USE AS DIRECTED 1 Device 1  . ezetimibe (ZETIA) 10 MG tablet Take 1 tablet (10 mg total) by mouth daily. 90 tablet 3  . famotidine (PEPCID) 20 MG tablet Take 1 tablet (20 mg total) by mouth daily. 90 tablet 3  . Fluticasone-Salmeterol (ADVAIR) 250-50 MCG/DOSE AEPB Inhale 1 puff into the lungs every 12 (twelve) hours. 60 each 11  . GLUCOSAMINE-CHONDROITIN PO Take by mouth.    Marland Kitchen HYDROcodone-homatropine (HYCODAN) 5-1.5 MG/5ML syrup Take 5 mLs by mouth at bedtime as needed. 60 mL 0  . Insulin Degludec (TRESIBA FLEXTOUCH) 200 UNIT/ML SOPN Inject 10 Units into the skin at bedtime. Increase by 2 units every 5 days until fasting glucose 120. This is a 30 day supply., 4 pen 2  . levocetirizine (XYZAL) 5 MG tablet Take 5 mg by mouth every evening.    Marland Kitchen levothyroxine (SYNTHROID, LEVOTHROID) 137 MCG tablet Take 1 tablet (137 mcg total) by mouth daily before breakfast. 90 tablet 1  . Lidocaine HCl 2 % SOLN Use as directed 10-15 mLs every 3 (three) hours as needed in the mouth or throat (mouth/throat pain). 100 mL 1  . metFORMIN (GLUCOPHAGE-XR) 750 MG 24 hr tablet Take 1 tablet (750 mg total) by mouth daily with breakfast. 90 tablet 0  . NAPROXEN SODIUM PO Take 220 mg by mouth 2 (two) times daily as needed.    . ondansetron (ZOFRAN) 8 MG tablet TAKE 1 TABLET BY MOUTH EVERY 8 HOURS AS NEEDED FOR NAUSEA AND VOMITING 20 tablet 2  . pregabalin (LYRICA) 50 MG capsule Take 1 capsule (50 mg total) 2 (two) times daily by mouth. 180 capsule 1  . promethazine (PHENERGAN) 25 MG tablet Take 1 tablet (25 mg  total) by mouth every 6 (six) hours as needed for nausea or vomiting. 30 tablet 0  . QUEtiapine (SEROQUEL) 200 MG tablet Take 1 tablet (200 mg total) by mouth at bedtime. 90 tablet 1  . sertraline (ZOLOFT) 100 MG tablet Take 2 tablets (200 mg total) by mouth daily. 120 tablet 1  . simvastatin (ZOCOR) 40 MG tablet Take 1 tablet (40 mg total) by mouth at bedtime. 90 tablet 3  . SURE COMFORT PEN NEEDLES 32G X 4 MM MISC USE AS DIRECTED 100 each prn  . triamcinolone cream (KENALOG) 0.1 % Apply 1 application topically 2 (two) times daily. For itchy bumps. 60 g 1   No current facility-administered medications for this visit.    Allergies  Allergen Reactions  . Atorvastatin   . Esomeprazole Magnesium   . Gluten Meal   . Ivp Dye [Iodinated Diagnostic Agents] Other (See  Comments)    Per patient cardiac arrest  . Latex   . Penicillins   . Trazodone And Nefazodone Hives  . Zolpidem Tartrate Nausea And Vomiting      Review of Systems:  Constitutional:  No  fever, no chills, No unintentional weight changes. +significant fatigue - stable  HEENT: No  headache, no vision change  Cardiac: No  chest pain, No  pressure, No palpitation  Respiratory:  No  shortness of breath. +Cough  Neurologic: No  weakness, No  dizziness,  Psychiatric: No  concerns with depression, No concerns with anxiety, No sleep problems, No mood problems  Exam:  BP 132/81   Pulse 93   Temp 98.1 F (36.7 C) (Oral)   Wt 207 lb 4.8 oz (94 kg)   LMP 09/21/1974   BMI 36.72 kg/m   Constitutional: VS see above. General Appearance: alert, well-developed, well-nourished, NAD  Eyes: Normal lids and conjunctive, non-icteric sclera  Ears, Nose, Mouth, Throat: MMM, Normal external inspection ears/nares/mouth/lips/gums - no apparent gingivitis/infection   Neck: No masses, trachea midline.   Respiratory: Normal respiratory effort. no wheeze, no rhonchi, no rales  Cardiovascular: S1/S2 normal, no murmur, no rub/gallop  auscultated. RRR.   Gastrointestinal: Nontender, no masses. No hepatomegaly, no splenomegaly.   Musculoskeletal: Gait normal.   Neurological: Normal balance/coordination. No tremor.   Skin: warm, dry, intact. No rash/ulcer.  Psychiatric: Fair judgment/insight.    Results for orders placed or performed in visit on 07/11/17 (from the past 24 hour(s))  POCT HgB A1C     Status: None   Collection Time: 07/11/17  2:50 PM  Result Value Ref Range   Hemoglobin A1C 8.0      ASSESSMENT/PLAN: Doing well today. Reviewed few chronic issues, everything stable/doing well. Pt feeling well.   Type 2 diabetes mellitus with diabetic polyneuropathy, with long-term current use of insulin ([facility]) - A1c could be a little bit better but overall I am not too worried about 8.  Hypoglycemia.  Continue current insulin regimen - Plan: POCT HgB A1C  Tobacco dependence - Discussed risks versus benefits of Chantix.  She has failed nicotine replacement in the past, failed Wellbutrin.  Hypotension, unspecified hypotension type - Soft at this point, continue to monitor.  Pulpitis - Continue follow-up with dentist   Meds ordered this encounter  Medications  . varenicline (CHANTIX CONTINUING MONTH PAK) 1 MG tablet    Sig: Take 1 tablet (1 mg total) by mouth 2 (two) times daily.    Dispense:  30 tablet    Refill:  1  . varenicline (CHANTIX STARTING MONTH PAK) 0.5 MG X 11 & 1 MG X 42 tablet    Sig: Take one 0.5 mg tablet by mouth once daily for 3 days, then increase to one 0.5 mg tablet twice daily for 4 days, then increase to one 1 mg tablet twice daily.    Dispense:  53 tablet    Refill:  0      Visit summary with medication list and pertinent instructions was printed for patient to review. All questions at time of visit were answered - patient instructed to contact office with any additional concerns. ER/RTC precautions were reviewed with the patient. Follow-up plan: Return in about 3 months (around  10/08/2017) for recheck A1C, sooner if needed .  Total time spent 25 minutes. Greater than 50% of the visit was face-to-face time counseling and coordinating care for the following diagnoses: The primary encounter diagnosis was Type 2 diabetes mellitus with diabetic polyneuropathy,  with long-term current use of insulin ([place]). Diagnoses of Tobacco dependence, Hypotension, unspecified hypotension type, and Pulpitis were also pertinent to this visit.

## 2017-07-11 NOTE — Patient Instructions (Signed)
Varenicline oral tablets What is this medicine? VARENICLINE (var EN i kleen) is used to help people quit smoking. It can reduce the symptoms caused by stopping smoking. It is used with a patient support program recommended by your physician. This medicine may be used for other purposes; ask your health care provider or pharmacist if you have questions. COMMON BRAND NAME(S): Chantix What should I tell my health care provider before I take this medicine? They need to know if you have any of these conditions: -bipolar disorder, depression, schizophrenia or other mental illness -heart disease -if you often drink alcohol -kidney disease -peripheral vascular disease -seizures -stroke -suicidal thoughts, plans, or attempt; a previous suicide attempt by you or a family member -an unusual or allergic reaction to varenicline, other medicines, foods, dyes, or preservatives -pregnant or trying to get pregnant -breast-feeding How should I use this medicine? Take this medicine by mouth after eating. Take with a full glass of water. Follow the directions on the prescription label. Take your doses at regular intervals. Do not take your medicine more often than directed. There are 3 ways you can use this medicine to help you quit smoking; talk to your health care professional to decide which plan is right for you: 1) you can choose a quit date and start this medicine 1 week before the quit date, or, 2) you can start taking this medicine before you choose a quit date, and then pick a quit date between day 8 and 35 days of treatment, or, 3) if you are not sure that you are able or willing to quit smoking right away, start taking this medicine and slowly decrease the amount you smoke as directed by your health care professional with the goal of being cigarette-free by week 12 of treatment. Stick to your plan; ask about support groups or other ways to help you remain cigarette-free. If you are motivated to quit  smoking and did not succeed during a previous attempt with this medicine for reasons other than side effects, or if you returned to smoking after this treatment, speak with your health care professional about whether another course of this medicine may be right for you. A special MedGuide will be given to you by the pharmacist with each prescription and refill. Be sure to read this information carefully each time. Talk to your pediatrician regarding the use of this medicine in children. This medicine is not approved for use in children. Overdosage: If you think you have taken too much of this medicine contact a poison control center or emergency room at once. NOTE: This medicine is only for you. Do not share this medicine with others. What if I miss a dose? If you miss a dose, take it as soon as you can. If it is almost time for your next dose, take only that dose. Do not take double or extra doses. What may interact with this medicine? -alcohol or any product that contains alcohol -insulin -other stop smoking aids -theophylline -warfarin This list may not describe all possible interactions. Give your health care provider a list of all the medicines, herbs, non-prescription drugs, or dietary supplements you use. Also tell them if you smoke, drink alcohol, or use illegal drugs. Some items may interact with your medicine. What should I watch for while using this medicine? Visit your doctor or health care professional for regular check ups. Ask for ongoing advice and encouragement from your doctor or healthcare professional, friends, and family to help you quit. If  you smoke while on this medication, quit again Your mouth may get dry. Chewing sugarless gum or sucking hard candy, and drinking plenty of water may help. Contact your doctor if the problem does not go away or is severe. You may get drowsy or dizzy. Do not drive, use machinery, or do anything that needs mental alertness until you know how  this medicine affects you. Do not stand or sit up quickly, especially if you are an older patient. This reduces the risk of dizzy or fainting spells. Sleepwalking can happen during treatment with this medicine, and can sometimes lead to behavior that is harmful to you, other people, or property. Stop taking this medicine and tell your doctor if you start sleepwalking or have other unusual sleep-related activity. Decrease the amount of alcoholic beverages that you drink during treatment with this medicine until you know if this medicine affects your ability to tolerate alcohol. Some people have experienced increased drunkenness (intoxication), unusual or sometimes aggressive behavior, or no memory of things that have happened (amnesia) during treatment with this medicine. The use of this medicine may increase the chance of suicidal thoughts or actions. Pay special attention to how you are responding while on this medicine. Any worsening of mood, or thoughts of suicide or dying should be reported to your health care professional right away. What side effects may I notice from receiving this medicine? Side effects that you should report to your doctor or health care professional as soon as possible: -allergic reactions like skin rash, itching or hives, swelling of the face, lips, tongue, or throat -acting aggressive, being angry or violent, or acting on dangerous impulses -breathing problems -changes in vision -chest pain or chest tightness -confusion, trouble speaking or understanding -new or worsening depression, anxiety, or panic attacks -extreme increase in activity and talking (mania) -fast, irregular heartbeat -feeling faint or lightheaded, falls -fever -pain in legs when walking -problems with balance, talking, walking -redness, blistering, peeling or loosening of the skin, including inside the mouth -ringing in ears -seeing or hearing things that aren't there  (hallucinations) -seizures -sleepwalking -sudden numbness or weakness of the face, arm or leg -thoughts about suicide or dying, or attempts to commit suicide -trouble passing urine or change in the amount of urine -unusual bleeding or bruising -unusually weak or tired Side effects that usually do not require medical attention (report to your doctor or health care professional if they continue or are bothersome): -constipation -headache -nausea, vomiting -strange dreams -stomach gas -trouble sleeping This list may not describe all possible side effects. Call your doctor for medical advice about side effects. You may report side effects to FDA at 1-800-FDA-1088. Where should I keep my medicine? Keep out of the reach of children. Store at room temperature between 15 and 30 degrees C (59 and 86 degrees F). Throw away any unused medicine after the expiration date. NOTE: This sheet is a summary. It may not cover all possible information. If you have questions about this medicine, talk to your doctor, pharmacist, or health care provider.  2018 [employer]vier/Gold Standard (2015-01-29 16:14:23)

## 2017-07-13 ENCOUNTER — Encounter: Payer: Self-pay | Admitting: Osteopathic Medicine

## 2017-07-14 ENCOUNTER — Ambulatory Visit: Payer: Self-pay | Admitting: Osteopathic Medicine

## 2017-07-21 ENCOUNTER — Other Ambulatory Visit: Payer: Self-pay | Admitting: Osteopathic Medicine

## 2017-07-21 DIAGNOSIS — G629 Polyneuropathy, unspecified: Secondary | ICD-10-CM

## 2017-07-21 NOTE — Telephone Encounter (Signed)
Patient requesting a refill of Lyrica be sent to [place], [place]. Last refill was 04/13/2017. Please advise.

## 2017-07-26 ENCOUNTER — Other Ambulatory Visit: Payer: Self-pay

## 2017-07-26 MED ORDER — SERTRALINE HCL 100 MG PO TABS: 200.0000 mg | ORAL_TABLET | Freq: Every day | ORAL | 1 refills | Status: DC

## 2017-07-26 MED ORDER — LEVOTHYROXINE SODIUM 137 MCG PO TABS: 137.0000 ug | ORAL_TABLET | Freq: Every day | ORAL | 1 refills | Status: DC

## 2017-07-28 ENCOUNTER — Other Ambulatory Visit: Payer: Self-pay | Admitting: Osteopathic Medicine

## 2017-08-16 DIAGNOSIS — H43812 Vitreous degeneration, left eye: Secondary | ICD-10-CM | POA: Diagnosis not present

## 2017-08-16 DIAGNOSIS — H2512 Age-related nuclear cataract, left eye: Secondary | ICD-10-CM | POA: Diagnosis not present

## 2017-08-16 DIAGNOSIS — H26491 Other secondary cataract, right eye: Secondary | ICD-10-CM | POA: Diagnosis not present

## 2017-08-16 DIAGNOSIS — Z961 Presence of intraocular lens: Secondary | ICD-10-CM | POA: Diagnosis not present

## 2017-08-16 DIAGNOSIS — H353132 Nonexudative age-related macular degeneration, bilateral, intermediate dry stage: Secondary | ICD-10-CM | POA: Diagnosis not present

## 2017-08-16 DIAGNOSIS — H40053 Ocular hypertension, bilateral: Secondary | ICD-10-CM | POA: Diagnosis not present

## 2017-09-05 ENCOUNTER — Other Ambulatory Visit: Payer: Self-pay | Admitting: Osteopathic Medicine

## 2017-09-20 DIAGNOSIS — Z961 Presence of intraocular lens: Secondary | ICD-10-CM | POA: Diagnosis not present

## 2017-09-20 DIAGNOSIS — H26491 Other secondary cataract, right eye: Secondary | ICD-10-CM | POA: Diagnosis not present

## 2017-09-21 ENCOUNTER — Other Ambulatory Visit: Payer: Self-pay | Admitting: Osteopathic Medicine

## 2017-10-09 ENCOUNTER — Ambulatory Visit: Payer: Self-pay | Admitting: Osteopathic Medicine

## 2017-10-10 ENCOUNTER — Ambulatory Visit: Payer: Self-pay | Admitting: Osteopathic Medicine

## 2017-10-12 ENCOUNTER — Other Ambulatory Visit: Payer: Self-pay

## 2017-10-12 MED ORDER — METFORMIN HCL ER 750 MG PO TB24: 750.0000 mg | ORAL_TABLET | Freq: Every day | ORAL | 0 refills | Status: DC

## 2017-10-17 ENCOUNTER — Ambulatory Visit (INDEPENDENT_AMBULATORY_CARE_PROVIDER_SITE_OTHER): Payer: Medicare HMO | Admitting: Osteopathic Medicine

## 2017-10-17 ENCOUNTER — Encounter: Payer: Self-pay | Admitting: Osteopathic Medicine

## 2017-10-17 VITALS — BP 108/55 | HR 84 | Temp 98.2°F | Wt 212.5 lb

## 2017-10-17 DIAGNOSIS — E1142 Type 2 diabetes mellitus with diabetic polyneuropathy: Secondary | ICD-10-CM

## 2017-10-17 DIAGNOSIS — H409 Unspecified glaucoma: Secondary | ICD-10-CM | POA: Diagnosis not present

## 2017-10-17 DIAGNOSIS — K029 Dental caries, unspecified: Secondary | ICD-10-CM | POA: Diagnosis not present

## 2017-10-17 DIAGNOSIS — Z794 Long term (current) use of insulin: Secondary | ICD-10-CM | POA: Diagnosis not present

## 2017-10-17 DIAGNOSIS — F17200 Nicotine dependence, unspecified, uncomplicated: Secondary | ICD-10-CM

## 2017-10-17 DIAGNOSIS — K0401 Reversible pulpitis: Secondary | ICD-10-CM | POA: Diagnosis not present

## 2017-10-17 LAB — POCT GLYCOSYLATED HEMOGLOBIN (HGB A1C): Hemoglobin A1C: 8.1 % — AB (ref 4.0–5.6)

## 2017-10-17 MED ORDER — INSULIN DEGLUDEC 200 UNIT/ML ~~LOC~~ SOPN: 16.0000 [IU] | PEN_INJECTOR | Freq: Every day | SUBCUTANEOUS | 11 refills | Status: DC

## 2017-10-17 MED ORDER — VARENICLINE TARTRATE 1 MG PO TABS: 1.0000 mg | ORAL_TABLET | Freq: Two times a day (BID) | ORAL | 3 refills | Status: DC

## 2017-10-17 NOTE — Progress Notes (Signed)
HPI: Autumn Bowen is a 72 y.o. female  who presents to [facility] today, 10/17/17,  for chief complaint of:  DM f/u BP f/u  Diabetes: No hypoglycemia. Last A1C 8.0 three mos ago, 8.1 today. Dental issues have made diet compliance difficult - she is eating mostly soup. Tresiba 16 units in the evenings, no hypoglycemia. Fasting Glc occasionally into 150s but usually lower, though she can't say. No logs with her today.   Eyes: Recent eye exam, we don't have records available but no retinopathy per patient though she was diagnosed with cataracts and her Rx for lenses changed, also was put on drops for glaucoma. She is really anxious about macular degeneration and blindness.   Blood pressure: Due to hypotension concerns, we a few months ago stopped lisinopril with discussion of risks versus benefits of doing this given benefits for renal protection in diabetic patients. BP has been better w/o dizziness or weakness, though BP still a little bit low. No palpitations.   Smoking: Has cut back a lot, needs refill on Chantix     Results for orders placed or performed in visit on 10/17/17 (from the past 24 hour(s))  POCT HgB A1C     Status: Abnormal   Collection Time: 10/17/17  9:35 AM  Result Value Ref Range   Hemoglobin A1C 8.1 (A) 4.0 - 5.6 %   HbA1c, POC (prediabetic range)  5.7 - 6.4 %   HbA1c, POC (controlled diabetic range)  0.0 - 7.0 %      Past medical, surgical, social and family history reviewed: Patient Active Problem List   Diagnosis Date Noted  . Hypotension 09/22/2016  . Osteopenia 05/24/2016  . Abdominal pain 04/12/2016  . Diverticulitis of colon 04/12/2016  . Broken or cracked tooth, nontraumatic 03/27/2016  . Memory deficit 05/05/2015  . Balance problems 05/05/2015  . Hearing difficulty of both ears 05/05/2015  . Mouth lesion 05/04/2015  . OSA (obstructive sleep apnea) 04/28/2015  . Seborrheic keratoses 01/31/2015  .  Left-sided low back pain with left-sided sciatica 01/31/2015  . GERD (gastroesophageal reflux disease) 01/26/2015  . Celiac disease 01/26/2015  . Cervical spondylosis 12/04/2014  . Osteoarthritis of first metatarsophalangeal joint 11/17/2014  . Ingrown left big toenail 10/04/2014  . Seasonal allergies 10/04/2014  . Neuropathy 10/04/2014  . Toenail fungus 10/03/2014  . Adenomatous polyp of colon 07/30/2014  . Macular degeneration, dry 07/29/2014  . Type 2 diabetes mellitus with complication ([place]) 96/29/5284  . Migraine without aura and without status migrainosus, not intractable 07/28/2014  . EMPHYSEMA 05/11/2009  . COPD (chronic obstructive pulmonary disease) with chronic bronchitis ([place]) 05/10/2009  . VISION DISORDER 12/03/2008  . OSTEOPENIA 11/18/2008  . VITAMIN D DEFICIENCY 11/06/2008  . Major depressive disorder 11/05/2008  . FATIGUE 11/05/2008  . ANEMIA 10/23/2008  . DYSLIPIDEMIA 10/16/2008  . TOBACCO ABUSE 10/15/2008  . ALLERGIC RHINITIS 10/15/2008  . FRACTURE, ANKLE, RIGHT 04/29/2008  . DIARRHEA 07/01/2007  . POST TRAUMATIC STRESS SYNDROME 05/30/2000  . Hypothyroidism 05/30/1988  . POSTMENOPAUSAL STATUS 05/30/1968   Past Surgical History:  Procedure Laterality Date  . APPENDECTOMY    . BREAST SURGERY     h/o benign cystleft breast and lymp node removal on right breast area.  Marland Kitchen EYE SURGERY     Left eye cataract removal, right eye h/o macular degeneration.  Marland Kitchen SPINE SURGERY     Pt not sure of the type of surgery she had but knows it was L4-5.  Marland Kitchen VAGINAL HYSTERECTOMY  Social History   Tobacco Use  . Smoking status: Current Every Day Smoker    Packs/day: 0.50    Years: 51.00    Pack years: 25.50    Types: Cigarettes    Start date: 01/02/2012  . Smokeless tobacco: Never Used  Substance Use Topics  . Alcohol use: No      Current medication list and allergy/intolerance information reviewed:   Current Outpatient Medications  Medication Sig Dispense Refill  .  ACCU-CHEK AVIVA PLUS test strip test 1-2 TIMES daily 100 each 11  . acetaminophen-codeine (TYLENOL #3) 300-30 MG tablet Take 1 tablet every 4 (four) hours as needed by mouth for moderate pain or severe pain. (Patient not taking: Reported on 07/11/2017) 30 tablet 0  . albuterol (PROVENTIL HFA;VENTOLIN HFA) 108 (90 Base) MCG/ACT inhaler Inhale 2 puffs into the lungs every 6 (six) hours as needed for wheezing or shortness of breath. 2 Inhaler 5  . AMBULATORY NON FORMULARY MEDICATION Glucometer, test strips and lancets for testing once a day for DM, uncontrolled. 100 strip 0  . amitriptyline (ELAVIL) 50 MG tablet Take 1 tablet (50 mg total) by mouth at bedtime. 90 tablet 0  . dicyclomine (BENTYL) 10 MG capsule Take 1 capsule (10 mg total) 2 (two) times daily by mouth. 180 capsule 3  . EPIPEN 2-PAK 0.3 MG/0.3ML SOAJ injection USE AS DIRECTED 1 Device 1  . ezetimibe (ZETIA) 10 MG tablet Take 1 tablet (10 mg total) by mouth daily. 90 tablet 3  . famotidine (PEPCID) 20 MG tablet Take 1 tablet (20 mg total) by mouth daily. 90 tablet 3  . Fluticasone-Salmeterol (ADVAIR) 250-50 MCG/DOSE AEPB Inhale 1 puff into the lungs every 12 (twelve) hours. 60 each 11  . GLUCOSAMINE-CHONDROITIN PO Take by mouth.    Marland Kitchen HYDROcodone-homatropine (HYCODAN) 5-1.5 MG/5ML syrup Take 5 mLs by mouth at bedtime as needed. (Patient not taking: Reported on 07/11/2017) 60 mL 0  . Insulin Degludec (TRESIBA FLEXTOUCH) 200 UNIT/ML SOPN Inject 10 Units into the skin at bedtime. Increase by 2 units every 5 days until fasting glucose 120. This is a 30 day supply., 4 pen 2  . levocetirizine (XYZAL) 5 MG tablet Take 5 mg by mouth every evening.    Marland Kitchen levothyroxine (SYNTHROID, LEVOTHROID) 137 MCG tablet Take 1 tablet (137 mcg total) by mouth daily before breakfast. 90 tablet 1  . Lidocaine HCl 2 % SOLN Use as directed 10-15 mLs every 3 (three) hours as needed in the mouth or throat (mouth/throat pain). 100 mL 1  . LYRICA 50 MG capsule TAKE ONE  CAPSULE TWICE DAILY BY MOUTH 180 capsule 1  . metFORMIN (GLUCOPHAGE-XR) 750 MG 24 hr tablet Take 1 tablet (750 mg total) by mouth daily with breakfast. 90 tablet 0  . NAPROXEN SODIUM PO Take 220 mg by mouth 2 (two) times daily as needed.    . ondansetron (ZOFRAN) 8 MG tablet TAKE 1 TABLET BY MOUTH EVERY 8 HOURS AS NEEDED FOR NAUSEA AND VOMITING 20 tablet 2  . promethazine (PHENERGAN) 25 MG tablet Take 1 tablet (25 mg total) by mouth every 6 (six) hours as needed for nausea or vomiting. 30 tablet 0  . QUEtiapine (SEROQUEL) 200 MG tablet Take 1 tablet (200 mg total) by mouth at bedtime. 90 tablet 1  . sertraline (ZOLOFT) 100 MG tablet Take 2 tablets (200 mg total) by mouth daily. 120 tablet 1  . simvastatin (ZOCOR) 40 MG tablet Take 1 tablet (40 mg total) by mouth at bedtime. 90 tablet  3  . SURE COMFORT PEN NEEDLES 32G X 4 MM MISC USE AS DIRECTED 100 each prn  . triamcinolone cream (KENALOG) 0.1 % Apply 1 application topically 2 (two) times daily. For itchy bumps. (Patient not taking: Reported on 07/11/2017) 60 g 1  . varenicline (CHANTIX CONTINUING MONTH PAK) 1 MG tablet Take 1 tablet (1 mg total) by mouth 2 (two) times daily. 30 tablet 1  . varenicline (CHANTIX STARTING MONTH PAK) 0.5 MG X 11 & 1 MG X 42 tablet TAKE ONE 0.5MG TABLET BY MOUTH ONCE DAILY FOR THREE DAYS, THEN INCREASE TO ONE 0.5MG TABLET TWICE DAILY FOR FOUR DAYS, THEN ONE MG TABLET TW 53 tablet 0   No current facility-administered medications for this visit.    Allergies  Allergen Reactions  . Atorvastatin   . Esomeprazole Magnesium   . Gluten Meal   . Ivp Dye [Iodinated Diagnostic Agents] Other (See Comments)    Per patient cardiac arrest  . Latex   . Penicillins   . Trazodone And Nefazodone Hives  . Zolpidem Tartrate Nausea And Vomiting      Review of Systems:  Constitutional:  No  fever, no chills, No unintentional weight changes. +significant fatigue - stable  HEENT: No  headache, no vision change  Cardiac: No   chest pain, No  pressure, No palpitation  Respiratory:  No  shortness of breath. +Cough, better but chronic   Neurologic: No  weakness, No  dizziness,  Psychiatric: No  concerns with depression, No concerns with anxiety, No sleep problems, No mood problems  Exam:  BP (!) 108/55 (BP Location: Left Arm, Patient Position: Sitting, Cuff Size: Normal)   Pulse 84   Temp 98.2 F (36.8 C) (Oral)   Wt 212 lb 8 oz (96.4 kg)   LMP 09/21/1974   BMI 37.64 kg/m   Constitutional: VS see above. General Appearance: alert, well-developed, well-nourished, NAD  Eyes: Normal lids and conjunctive, non-icteric sclera  Ears, Nose, Mouth, Throat: MMM, Normal external inspection ears/nares/mouth/lips/gums - no apparent gingivitis/infection  Neck: No masses, trachea midline.   Respiratory: Normal respiratory effort. no wheeze, no rhonchi, no rales  Cardiovascular: S1/S2 normal, no murmur, no rub/gallop auscultated. RRR.   Gastrointestinal: Nontender, no masses. No hepatomegaly, no splenomegaly.   Musculoskeletal: Gait normal.   Neurological: Normal balance/coordination. No tremor.   Skin: warm, dry, intact. No rash/ulcer.  Psychiatric: Fair judgment/insight.    Results for orders placed or performed in visit on 10/17/17 (from the past 24 hour(s))  POCT HgB A1C     Status: Abnormal   Collection Time: 10/17/17  9:35 AM  Result Value Ref Range   Hemoglobin A1C 8.1 (A) 4.0 - 5.6 %   HbA1c, POC (prediabetic range)  5.7 - 6.4 %   HbA1c, POC (controlled diabetic range)  0.0 - 7.0 %     ASSESSMENT/PLAN: Doing well today. Reviewed few chronic issues, everything stable/doing overall ok except dental issues and A1C could be a bit better. Discussed titrating up on Tresiba a bit   The primary encounter diagnosis was Type 2 diabetes mellitus with diabetic polyneuropathy, with long-term current use of insulin ([place]). Diagnoses of Tobacco dependence, Pulpitis, Dental caries, and Glaucoma of both eyes,  unspecified glaucoma type were also pertinent to this visit.    Meds ordered this encounter  Medications  . varenicline (CHANTIX CONTINUING MONTH PAK) 1 MG tablet    Sig: Take 1 tablet (1 mg total) by mouth 2 (two) times daily.    Dispense:  30  tablet    Refill:  3  . Insulin Degludec (TRESIBA FLEXTOUCH) 200 UNIT/ML SOPN    Sig: Inject 16 Units into the skin at bedtime. Increase by 2 units every 5 days until fasting glucose 90-120.    Dispense:  5 pen    Refill:  11      Visit summary with medication list and pertinent instructions was printed for patient to review. All questions at time of visit were answered - patient instructed to contact office with any additional concerns. ER/RTC precautions were reviewed with the patient. Follow-up plan: Return in about 3 months (around 01/17/2018) for recheck A1C/diabetes .  Total time spent 25 minutes. Greater than 50% of the visit was face-to-face time counseling and coordinating care for the following diagnoses: The primary encounter diagnosis was Type 2 diabetes mellitus with diabetic polyneuropathy, with long-term current use of insulin ([place]). Diagnoses of Tobacco dependence, Pulpitis, Dental caries, and Glaucoma of both eyes, unspecified glaucoma type were also pertinent to this visit.

## 2017-10-17 NOTE — Patient Instructions (Signed)
Plan:  Let's increase the [place] a bit, up to 18 or even 20 units a day until you're able to get the teeth issue taken care of and can be better with diet   Goal fasting sugar 90-120, if lower than that, can go back to the 16 units [place]

## 2017-10-25 DIAGNOSIS — S90511A Abrasion, right ankle, initial encounter: Secondary | ICD-10-CM | POA: Diagnosis not present

## 2017-10-25 DIAGNOSIS — E119 Type 2 diabetes mellitus without complications: Secondary | ICD-10-CM | POA: Diagnosis not present

## 2017-10-25 DIAGNOSIS — E079 Disorder of thyroid, unspecified: Secondary | ICD-10-CM | POA: Diagnosis not present

## 2017-10-25 DIAGNOSIS — F329 Major depressive disorder, single episode, unspecified: Secondary | ICD-10-CM | POA: Diagnosis not present

## 2017-10-25 DIAGNOSIS — G8911 Acute pain due to trauma: Secondary | ICD-10-CM | POA: Diagnosis not present

## 2017-10-25 DIAGNOSIS — M25511 Pain in right shoulder: Secondary | ICD-10-CM | POA: Diagnosis not present

## 2017-10-25 DIAGNOSIS — S50811A Abrasion of right forearm, initial encounter: Secondary | ICD-10-CM | POA: Diagnosis not present

## 2017-10-25 DIAGNOSIS — I252 Old myocardial infarction: Secondary | ICD-10-CM | POA: Diagnosis not present

## 2017-10-25 DIAGNOSIS — M25461 Effusion, right knee: Secondary | ICD-10-CM | POA: Diagnosis not present

## 2017-10-25 DIAGNOSIS — T07XXXA Unspecified multiple injuries, initial encounter: Secondary | ICD-10-CM | POA: Diagnosis not present

## 2017-10-25 DIAGNOSIS — F17210 Nicotine dependence, cigarettes, uncomplicated: Secondary | ICD-10-CM | POA: Diagnosis not present

## 2017-10-25 DIAGNOSIS — Z8673 Personal history of transient ischemic attack (TIA), and cerebral infarction without residual deficits: Secondary | ICD-10-CM | POA: Diagnosis not present

## 2017-10-25 DIAGNOSIS — J439 Emphysema, unspecified: Secondary | ICD-10-CM | POA: Diagnosis not present

## 2017-10-25 DIAGNOSIS — I10 Essential (primary) hypertension: Secondary | ICD-10-CM | POA: Diagnosis not present

## 2017-10-25 DIAGNOSIS — M25521 Pain in right elbow: Secondary | ICD-10-CM | POA: Diagnosis not present

## 2017-10-25 DIAGNOSIS — M7989 Other specified soft tissue disorders: Secondary | ICD-10-CM | POA: Diagnosis not present

## 2017-11-10 ENCOUNTER — Other Ambulatory Visit: Payer: Self-pay | Admitting: Osteopathic Medicine

## 2017-11-11 ENCOUNTER — Other Ambulatory Visit: Payer: Self-pay | Admitting: Osteopathic Medicine

## 2017-11-16 ENCOUNTER — Encounter: Payer: Self-pay | Admitting: Sports Medicine

## 2017-11-16 ENCOUNTER — Ambulatory Visit (INDEPENDENT_AMBULATORY_CARE_PROVIDER_SITE_OTHER): Payer: Medicare HMO

## 2017-11-16 ENCOUNTER — Ambulatory Visit (INDEPENDENT_AMBULATORY_CARE_PROVIDER_SITE_OTHER): Payer: Medicare HMO | Admitting: Sports Medicine

## 2017-11-16 DIAGNOSIS — G8929 Other chronic pain: Secondary | ICD-10-CM | POA: Diagnosis not present

## 2017-11-16 DIAGNOSIS — M5442 Lumbago with sciatica, left side: Secondary | ICD-10-CM

## 2017-11-16 DIAGNOSIS — M1711 Unilateral primary osteoarthritis, right knee: Secondary | ICD-10-CM | POA: Diagnosis not present

## 2017-11-16 DIAGNOSIS — M11262 Other chondrocalcinosis, left knee: Secondary | ICD-10-CM | POA: Diagnosis not present

## 2017-11-16 DIAGNOSIS — M25562 Pain in left knee: Secondary | ICD-10-CM

## 2017-11-16 DIAGNOSIS — M47812 Spondylosis without myelopathy or radiculopathy, cervical region: Secondary | ICD-10-CM

## 2017-11-16 DIAGNOSIS — M4316 Spondylolisthesis, lumbar region: Secondary | ICD-10-CM | POA: Diagnosis not present

## 2017-11-16 DIAGNOSIS — M1712 Unilateral primary osteoarthritis, left knee: Secondary | ICD-10-CM

## 2017-11-16 DIAGNOSIS — M542 Cervicalgia: Secondary | ICD-10-CM

## 2017-11-16 DIAGNOSIS — M545 Low back pain: Secondary | ICD-10-CM | POA: Diagnosis not present

## 2017-11-16 DIAGNOSIS — M25561 Pain in right knee: Secondary | ICD-10-CM | POA: Diagnosis not present

## 2017-11-16 MED ORDER — PREDNISONE 50 MG PO TABS: ORAL_TABLET | ORAL | 0 refills | Status: DC

## 2017-11-16 NOTE — Assessment & Plan Note (Signed)
Cervical x-rays, therapy.   Prednisone, return in 1 month and if no better we will proceed with an MRI for epidural planning.

## 2017-11-16 NOTE — Progress Notes (Addendum)
Subjective:    CC: Multiple issues  HPI: Left knee pain: Feels like a charlie horse in the front and the back, swelling, lack of range of motion, symptoms have come slowly, moderate, persistent without radiation.  Also having some numbness and tingling running down the back of the left leg to the foot, no bowel or bladder dysfunction, saddle numbness, constitutional symptoms.  Neck pain: Moderate, persistent, localized at the base of the skull with radiation to the back of the head and over both shoulders.  I reviewed the past medical history, family history, social history, surgical history, and allergies today and no changes were needed.  Please see the problem list section below in epic for further details.  Past Medical History: Past Medical History:  Diagnosis Date  . Allergy    Notes that grass and some trees causes eyes to sting, burn, and water.  . Anemia   . Celiac disease   . Celiac disease   . COPD (chronic obstructive pulmonary disease) ([place])   . Diabetes mellitus type II   . Diverticulitis   . Dyslipidemia   . Hypothyroidism   . Sore throat    Past Surgical History: Past Surgical History:  Procedure Laterality Date  . APPENDECTOMY    . BREAST SURGERY     h/o benign cystleft breast and lymp node removal on right breast area.  Marland Kitchen EYE SURGERY     Left eye cataract removal, right eye h/o macular degeneration.  Marland Kitchen SPINE SURGERY     Pt not sure of the type of surgery she had but knows it was L4-5.  Marland Kitchen VAGINAL HYSTERECTOMY     Social History: Social History   Socioeconomic History  . Marital status: Divorced    Spouse name: Not on file  . Number of children: Not on file  . Years of education: Not on file  . Highest education level: Not on file  Occupational History  . Not on file  Social Needs  . Financial resource strain: Not on file  . Food insecurity:    Worry: Not on file    Inability: Not on file  . Transportation needs:    Medical: Not on file   Non-medical: Not on file  Tobacco Use  . Smoking status: Current Every Day Smoker    Packs/day: 0.50    Years: 51.00    Pack years: 25.50    Types: Cigarettes    Start date: 01/02/2012  . Smokeless tobacco: Never Used  Substance and Sexual Activity  . Alcohol use: No  . Drug use: No  . Sexual activity: Not Currently  Lifestyle  . Physical activity:    Days per week: Not on file    Minutes per session: Not on file  . Stress: Not on file  Relationships  . Social connections:    Talks on phone: Not on file    Gets together: Not on file    Attends religious service: Not on file    Active member of club or organization: Not on file    Attends meetings of clubs or organizations: Not on file    Relationship status: Not on file  Other Topics Concern  . Not on file  Social History Narrative  . Not on file   Family History: Family History  Problem Relation Age of Onset  . Anxiety disorder Mother   . Depression Mother   . Heart attack Mother   . Stroke Mother   . Diabetes type II Mother   .  Diabetes Father   . Glaucoma Father   . Hypertension Father   . COPD Father   . COPD Sister   . Hashimoto's thyroiditis Sister   . Cancer Sister   . Kidney disease Sister   . Drug abuse Brother   . Alcohol abuse Brother   . Asthma Son   . Diabetes Son    Allergies: Allergies  Allergen Reactions  . Bee Venom Anaphylaxis  . Iodinated Diagnostic Agents Other (See Comments)    Per patient cardiac arrest IVP DYE-CARDIAC ARREST  . Penicillins Anaphylaxis  . Povidone Iodine Itching and Rash  . Tomato Itching  . Chocolate Rash  . Latex Rash  . Atorvastatin   . Atorvastatin Calcium Nausea Only  . Esomeprazole Magnesium   . Esomeprazole Magnesium Nausea Only  . Gluten Meal   . Metformin And Related Diarrhea  . Quetiapine Fumarate     Per pt she can not take XR  . Trazodone And Nefazodone Hives  . Zolpidem Tartrate Nausea And Vomiting   Medications: See med rec.  Review of  Systems: No fevers, chills, night sweats, weight loss, chest pain, or shortness of breath.   Objective:    General: Well Developed, well nourished, and in no acute distress.  Neuro: Alert and oriented x3, extra-ocular muscles intact, sensation grossly intact.  HEENT: Normocephalic, atraumatic, pupils equal round reactive to light, neck supple, no masses, no lymphadenopathy, thyroid nonpalpable.  Skin: Warm and dry, no rashes. Cardiac: Regular rate and rhythm, no murmurs rubs or gallops, no lower extremity edema.  Respiratory: Clear to auscultation bilaterally. Not using accessory muscles, speaking in full sentences. Left knee: Swollen, tender all around with a palpable fluid wave ROM normal in flexion and extension and lower leg rotation. Ligaments with solid consistent endpoints including ACL, PCL, LCL, MCL. Negative Mcmurray's and provocative meniscal tests. Non painful patellar compression. Patellar and quadriceps tendons unremarkable. Hamstring and quadriceps strength is normal.  Procedure: Real-time Ultrasound Guided aspiration/injection of left knee Device: GE Logiq E  Verbal informed consent obtained.  Time-out conducted.  Noted no overlying erythema, induration, or other signs of local infection.  Skin prepped in a sterile fashion.  Local anesthesia: Topical Ethyl chloride.  With sterile technique and under real time ultrasound guidance: Using an 18-gauge needle I aspirated 10 mL of cloudy, serosanguineous fluid, syringe switched and 1 cc kenalog 40, 2 cc lidocaine, 2 cc bupivacaine injected easily Completed without difficulty  Pain immediately resolved suggesting accurate placement of the medication.  Advised to call if fevers/chills, erythema, induration, drainage, or persistent bleeding.  Images permanently stored and available for review in the ultrasound unit.  Impression: Technically successful ultrasound guided injection.  Impression and Recommendations:     Pseudogout of left knee Aspiration and injection, x-rays. Fluid was cloudy, adding crystal analysis.  Crystal analysis is positive for calcium pyrophosphate crystals indicating that this is pseudogout.  From now and we will use colchicine for flares.  Left-sided low back pain with left-sided sciatica Post L4-L5 laminectomy with decompression, having a recurrence of left-sided lumbar radiculitis. Adding prednisone, physical therapy. If insufficient relief we will proceed with an MRI for epidural planning. Previous MRI is over 28 years old.  Cervical spondylosis Cervical x-rays, therapy.   Prednisone, return in 1 month and if no better we will proceed with an MRI for epidural planning. ___________________________________________ Gwen Her. Dianah Field, M.D., ABFM., CAQSM. Primary Care and [place] Instructor of [employer]

## 2017-11-16 NOTE — Assessment & Plan Note (Signed)
Post L4-L5 laminectomy with decompression, having a recurrence of left-sided lumbar radiculitis. Adding prednisone, physical therapy. If insufficient relief we will proceed with an MRI for epidural planning. Previous MRI is over 73 years old.

## 2017-11-16 NOTE — Assessment & Plan Note (Addendum)
Aspiration and injection, x-rays. Fluid was cloudy, adding crystal analysis.  Crystal analysis is positive for calcium pyrophosphate crystals indicating that this is pseudogout.  From now and we will use colchicine for flares.

## 2017-11-17 ENCOUNTER — Telehealth: Payer: Self-pay | Admitting: Sports Medicine

## 2017-11-17 LAB — SYNOVIAL FLUID, CRYSTAL

## 2017-11-17 NOTE — Telephone Encounter (Signed)
Degenerative changes in both knees, multilevel DDD in cervical and lumbar spine.  All expected, fluid analyzed and shows that she has pseudogout.  The injection will likely help her GREATLY but further flares can be treated with colchicine.

## 2017-11-17 NOTE — Telephone Encounter (Signed)
Autumn Bowen called at 4:40 inquiring about the results of her x-rays. She would like if someone could call her back.

## 2017-11-20 NOTE — Telephone Encounter (Signed)
Called Pt to give results, she advised now was not a good time and she would call back when convenient.

## 2017-11-22 ENCOUNTER — Ambulatory Visit: Payer: Self-pay | Admitting: Rehabilitative and Restorative Service Providers"

## 2017-11-23 ENCOUNTER — Ambulatory Visit: Payer: Medicare HMO | Admitting: Physical Therapy

## 2017-11-24 ENCOUNTER — Ambulatory Visit: Payer: Self-pay | Admitting: Osteopathic Medicine

## 2017-11-27 ENCOUNTER — Encounter: Payer: Self-pay | Admitting: Osteopathic Medicine

## 2017-11-27 ENCOUNTER — Ambulatory Visit (INDEPENDENT_AMBULATORY_CARE_PROVIDER_SITE_OTHER): Payer: Medicare HMO

## 2017-11-27 ENCOUNTER — Ambulatory Visit (INDEPENDENT_AMBULATORY_CARE_PROVIDER_SITE_OTHER): Payer: Medicare HMO | Admitting: Osteopathic Medicine

## 2017-11-27 VITALS — BP 121/60 | HR 90 | Temp 98.9°F | Wt 211.2 lb

## 2017-11-27 DIAGNOSIS — Z794 Long term (current) use of insulin: Secondary | ICD-10-CM

## 2017-11-27 DIAGNOSIS — R079 Chest pain, unspecified: Secondary | ICD-10-CM | POA: Diagnosis not present

## 2017-11-27 DIAGNOSIS — D229 Melanocytic nevi, unspecified: Secondary | ICD-10-CM | POA: Diagnosis not present

## 2017-11-27 DIAGNOSIS — F17200 Nicotine dependence, unspecified, uncomplicated: Secondary | ICD-10-CM | POA: Diagnosis not present

## 2017-11-27 DIAGNOSIS — R0789 Other chest pain: Secondary | ICD-10-CM | POA: Diagnosis not present

## 2017-11-27 DIAGNOSIS — E1142 Type 2 diabetes mellitus with diabetic polyneuropathy: Secondary | ICD-10-CM | POA: Diagnosis not present

## 2017-11-27 DIAGNOSIS — I700 Atherosclerosis of aorta: Secondary | ICD-10-CM

## 2017-11-27 LAB — TROPONIN I: Troponin I: <0.01 ng/mL (ref <=0.0)

## 2017-11-27 MED ORDER — SERTRALINE HCL 100 MG PO TABS: 200.0000 mg | ORAL_TABLET | Freq: Every day | ORAL | 1 refills | Status: DC

## 2017-11-27 NOTE — Progress Notes (Signed)
HPI: Autumn Bowen is a 72 y.o. female who  has a past medical history of Allergy, Anemia, Celiac disease, Celiac disease, COPD (chronic obstructive pulmonary disease) ([place]), Diabetes mellitus type II, Diverticulitis, Dyslipidemia, Hypothyroidism, and Sore throat.  she presents to [facility] today, 11/27/17,  for chief complaint of:  Medication question Chest Pain  Skin problem   Chantix:doing well w/ quitting, down to 1 pack per day instead of 2.   Prednisone: rx by Dr T for knww/MSK issues bumped her fasting Glc up a bit but it's starting to trend down.   Chest pain: new. R arm shooting pain and into her R side on the ribs below the breast. Happens at random. Few episodes lasting 10 mins or so, she almost called 911 but the pain dissipated.   Skin issue: lesion on L forehead and under R breast she'd like looked at.   Has upcoming appt w/ dentist, eye doctor, oral surgeon, physical therapy.     Past medical history, surgical history, and family history reviewed.  Current medication list and allergy/intolerance information reviewed.   (See remainder of HPI, ROS, Phys Exam below)  BP 121/60 (BP Location: Left Arm, Patient Position: Sitting, Cuff Size: Large)   Pulse 90   Temp 98.9 F (37.2 C) (Oral)   Wt 211 lb 3.2 oz (95.8 kg)   LMP 09/21/1974   BMI 37.41 kg/m   EKG interpretation: Rate: 79 Rhythm: sinus No ST/T changes concerning for acute ischemia/infarct  Previous EKG no tracings available    ASSESSMENT/PLAN:   Other chest pain - seem slikely rib related, hx rib fx on that side, seems less likely cardiac, given risk factors will send referral to cardio to discuss possible stress test  - Plan: EKG 12-Lead, CBC with Differential/Platelet, COMPLETE METABOLIC PANEL WITH GFR, Lipid panel, TSH, Troponin I, Ambulatory referral to Cardiology, DG Chest 2 View  Type 2 diabetes mellitus with diabetic polyneuropathy, with long-term current  use of insulin (HCC) - trnaisent elevation Glc likely d/t prednisone, not too worried about this   Suspicious nevus - L forehead ?basal cell but looks fairly large, R side under breast not sure what to make of this lesion, not really c/w melanoma to me, derm referral ASAP  - Plan: Ambulatory referral to Dermatology  Tobacco dependence - doing well w/ quitting      Patient Instructions  Plan:  Chest Xray and labs downstairs today I sent a referral for a cardiologist If worse, call 911!   I'm not too worried about the sugars going up a bit with the prednisone This should get better in the next week or two      Follow-up plan: Return for recheck A1C in August at currently scheduled visit .     ############################################ ############################################ ############################################ ############################################    Outpatient Encounter Medications as of 11/27/2017  Medication Sig Note  . ACCU-CHEK AVIVA PLUS test strip test 1-2 TIMES daily   . acetaminophen-codeine (TYLENOL #3) 300-30 MG tablet Take 1 tablet every 4 (four) hours as needed by mouth for moderate pain or severe pain.   Marland Kitchen albuterol (PROVENTIL HFA;VENTOLIN HFA) 108 (90 Base) MCG/ACT inhaler Inhale 2 puffs into the lungs every 6 (six) hours as needed for wheezing or shortness of breath.   . AMBULATORY NON FORMULARY MEDICATION Glucometer, test strips and lancets for testing once a day for DM, uncontrolled.   Marland Kitchen amitriptyline (ELAVIL) 50 MG tablet TAKE 1 TABLET BY MOUTH EVERY NIGHT AT BEDTIME   .  CHANTIX CONTINUING MONTH PAK 1 MG tablet TAKE 1 TABLET BY MOUTH TWICE DAILY( EVERY TWELVE HOURS)   . dicyclomine (BENTYL) 10 MG capsule Take 1 capsule (10 mg total) 2 (two) times daily by mouth.   . EPIPEN 2-PAK 0.3 MG/0.3ML SOAJ injection USE AS DIRECTED   . ezetimibe (ZETIA) 10 MG tablet Take 1 tablet (10 mg total) by mouth daily.   . famotidine (PEPCID) 20 MG tablet  Take 1 tablet (20 mg total) by mouth daily.   . Fluticasone-Salmeterol (ADVAIR) 250-50 MCG/DOSE AEPB Inhale 1 puff into the lungs every 12 (twelve) hours.   Marland Kitchen GLUCOSAMINE-CHONDROITIN PO Take by mouth.   . Insulin Degludec (TRESIBA FLEXTOUCH) 200 UNIT/ML SOPN Inject 16 Units into the skin at bedtime. Increase by 2 units every 5 days until fasting glucose 90-120.   Marland Kitchen levocetirizine (XYZAL) 5 MG tablet Take 5 mg by mouth every evening.   Marland Kitchen levothyroxine (SYNTHROID, LEVOTHROID) 137 MCG tablet Take 1 tablet (137 mcg total) by mouth daily before breakfast.   . LUMIGAN 0.01 % SOLN Instill 1 drop into both eyes every night   . LYRICA 50 MG capsule TAKE ONE CAPSULE TWICE DAILY BY MOUTH   . metFORMIN (GLUCOPHAGE-XR) 750 MG 24 hr tablet Take 1 tablet (750 mg total) by mouth daily with breakfast.   . NAPROXEN SODIUM PO Take 220 mg by mouth 2 (two) times daily as needed.   . ondansetron (ZOFRAN) 8 MG tablet TAKE 1 TABLET BY MOUTH EVERY 8 HOURS AS NEEDED FOR NAUSEA AND VOMITING   . promethazine (PHENERGAN) 25 MG tablet Take 1 tablet (25 mg total) by mouth every 6 (six) hours as needed for nausea or vomiting.   Marland Kitchen QUEtiapine (SEROQUEL) 200 MG tablet Take 1 tablet (200 mg total) by mouth at bedtime.   . sertraline (ZOLOFT) 100 MG tablet Take 2 tablets (200 mg total) by mouth daily.   . simvastatin (ZOCOR) 40 MG tablet Take 1 tablet (40 mg total) by mouth at bedtime.   . SURE COMFORT PEN NEEDLES 32G X 4 MM MISC USE AS DIRECTED   . triamcinolone cream (KENALOG) 0.1 % Apply 1 application topically 2 (two) times daily. For itchy bumps. 06/22/2016: Pt. Reports that she takes this prn.  . [DISCONTINUED] Lidocaine HCl 2 % SOLN Use as directed 10-15 mLs every 3 (three) hours as needed in the mouth or throat (mouth/throat pain).   . [DISCONTINUED] predniSONE (DELTASONE) 50 MG tablet One tab PO daily for 5 days.   . [DISCONTINUED] sertraline (ZOLOFT) 100 MG tablet Take 2 tablets (200 mg total) by mouth daily.    No  facility-administered encounter medications on file as of 11/27/2017.    Allergies  Allergen Reactions  . Bee Venom Anaphylaxis  . Iodinated Diagnostic Agents Other (See Comments)    Per patient cardiac arrest IVP DYE-CARDIAC ARREST  . Penicillins Anaphylaxis  . Povidone Iodine Itching and Rash  . Tomato Itching  . Chocolate Rash  . Latex Rash  . Atorvastatin   . Atorvastatin Calcium Nausea Only  . Esomeprazole Magnesium   . Esomeprazole Magnesium Nausea Only  . Gluten Meal   . Metformin And Related Diarrhea  . Quetiapine Fumarate     Per pt she can not take XR  . Trazodone And Nefazodone Hives  . Zolpidem Tartrate Nausea And Vomiting      Review of Systems:  Constitutional: No recent illness  HEENT: No  headache, no vision change  Cardiac: No  chest pain, No  pressure,  No palpitations  Respiratory:  No  shortness of breath. No  Cough  Gastrointestinal: No  abdominal pain, no change on bowel habits  Musculoskeletal: No new myalgia/arthralgia  Skin: Spots as per HPI   Hem/Onc: No  easy bruising/bleeding, No  abnormal lumps/bumps  Neurologic: No  weakness, No  Dizziness  Psychiatric: No  concerns with depression, No  concerns with anxiety  Exam:  BP 121/60 (BP Location: Left Arm, Patient Position: Sitting, Cuff Size: Large)   Pulse 90   Temp 98.9 F (37.2 C) (Oral)   Wt 211 lb 3.2 oz (95.8 kg)   LMP 09/21/1974   BMI 37.41 kg/m   Constitutional: VS see above. General Appearance: alert, well-developed, well-nourished, NAD  Eyes: Normal lids and conjunctive, non-icteric sclera  Ears, Nose, Mouth, Throat: MMM, Normal external inspection ears/nares/mouth/lips/gums.  Neck: No masses, trachea midline.   Respiratory: Normal respiratory effort. no wheeze, no rhonchi, no rales  Cardiovascular: S1/S2 normal, no murmur, no rub/gallop auscultated. RRR.   Musculoskeletal: Gait normal. Symmetric and independent movement of all extremities. +tenderness to palpation R  lower ribs on inhalation  Neurological: Normal balance/coordination. No tremor.  Skin: warm, intact. +purplish/pink smooth raised lesion on L forehead, no erythema, ?basal cell. +hyperpigmented brown lesion .75 cm diameter under R breast w. smaler dark spots, no whitish, clear borders, too smoother for seb ker though she has many of these below breast.   Psychiatric: Normal judgment/insight. Normal mood and affect. Oriented x3.   Visit summary with medication list and pertinent instructions was printed for patient to review, advised to alert [place] if any changes needed. All questions at time of visit were answered - patient instructed to contact office with any additional concerns. ER/RTC precautions were reviewed with the patient and understanding verbalized.   Follow-up plan: Return for recheck A1C in August at currently scheduled visit .  Note: Total time spent 40 minutes, greater than 50% of the visit was spent face-to-face counseling and coordinating care for the following: The primary encounter diagnosis was Other chest pain. Diagnoses of Type 2 diabetes mellitus with diabetic polyneuropathy, with long-term current use of insulin ([place]), Suspicious nevus, and Tobacco dependence were also pertinent to this visit.Marland Kitchen  Please note: voice recognition software was used to produce this document, and typos may escape review. Please contact Dr. Sheppard Coil for any needed clarifications.

## 2017-11-27 NOTE — Patient Instructions (Addendum)
Plan:  Chest Xray and labs downstairs today I sent a referral for a cardiologist If worse, call 911!   I'm not too worried about the sugars going up a bit with the prednisone This should get better in the next week or two

## 2017-11-28 ENCOUNTER — Ambulatory Visit: Payer: Medicare HMO | Admitting: Physical Therapy

## 2017-11-28 ENCOUNTER — Other Ambulatory Visit: Payer: Self-pay | Admitting: Osteopathic Medicine

## 2017-11-28 LAB — COMPLETE METABOLIC PANEL WITH GFR
AG Ratio: 1.6 (calc) (ref 1.0–2.5)
ALT: 13 U/L (ref 6–29)
AST: 15 U/L (ref 10–35)
Albumin: 4 g/dL (ref 3.6–5.1)
Alkaline phosphatase (APISO): 51 U/L (ref 33–130)
BUN: 16 mg/dL (ref 7–25)
CO2: 31 mmol/L (ref 20–32)
Calcium: 9.8 mg/dL (ref 8.6–10.4)
Chloride: 102 mmol/L (ref 98–110)
Creat: 0.73 mg/dL (ref 0.60–0.93)
GFR, Est African American: 95 mL/min/{1.73_m2} (ref >=60)
GFR, Est Non African American: 82 mL/min/{1.73_m2} (ref >=60)
Globulin: 2.5 g/dL (calc) (ref 1.9–3.7)
Glucose, Bld: 153 mg/dL — ABNORMAL HIGH (ref 65–99)
Potassium: 4.8 mmol/L (ref 3.5–5.3)
Sodium: 141 mmol/L (ref 135–146)
Total Bilirubin: 0.4 mg/dL (ref 0.2–1.2)
Total Protein: 6.5 g/dL (ref 6.1–8.1)

## 2017-11-28 LAB — CBC WITH DIFFERENTIAL/PLATELET
Basophils Absolute: 96 cells/uL (ref 0–200)
Basophils Relative: 0.8 %
Eosinophils Absolute: 240 cells/uL (ref 15–500)
Eosinophils Relative: 2 %
HCT: 41.8 % (ref 35.0–45.0)
Hemoglobin: 13.6 g/dL (ref 11.7–15.5)
Lymphs Abs: 2688 cells/uL (ref 850–3900)
MCH: 29.2 pg (ref 27.0–33.0)
MCHC: 32.5 g/dL (ref 32.0–36.0)
MCV: 89.9 fL (ref 80.0–100.0)
MPV: 9.6 fL (ref 7.5–12.5)
Monocytes Relative: 10.4 %
Neutro Abs: 7728 cells/uL (ref 1500–7800)
Neutrophils Relative %: 64.4 %
Platelets: 321 10*3/uL (ref 140–400)
RBC: 4.65 10*6/uL (ref 3.80–5.10)
RDW: 13.3 % (ref 11.0–15.0)
Total Lymphocyte: 22.4 %
WBC mixed population: 1248 cells/uL — ABNORMAL HIGH (ref 200–950)
WBC: 12 10*3/uL — ABNORMAL HIGH (ref 3.8–10.8)

## 2017-11-28 LAB — LIPID PANEL
Cholesterol: 160 mg/dL (ref <200)
HDL: 65 mg/dL (ref >50)
LDL Cholesterol (Calc): 73 mg/dL (calc)
Non-HDL Cholesterol (Calc): 95 mg/dL (calc) (ref <130)
Total CHOL/HDL Ratio: 2.5 (calc) (ref <5.0)
Triglycerides: 135 mg/dL (ref <150)

## 2017-11-28 LAB — TSH: TSH: 11.75 mIU/L — ABNORMAL HIGH (ref 0.40–4.50)

## 2017-11-28 MED ORDER — LEVOTHYROXINE SODIUM 150 MCG PO TABS: 150.0000 ug | ORAL_TABLET | Freq: Every day | ORAL | 0 refills | Status: DC

## 2017-11-29 ENCOUNTER — Encounter: Payer: Self-pay | Admitting: Physical Therapy

## 2017-11-29 ENCOUNTER — Other Ambulatory Visit: Payer: Self-pay

## 2017-11-29 ENCOUNTER — Ambulatory Visit: Payer: Medicare HMO | Attending: Sports Medicine | Admitting: Physical Therapy

## 2017-11-29 DIAGNOSIS — M5442 Lumbago with sciatica, left side: Secondary | ICD-10-CM | POA: Diagnosis present

## 2017-11-29 DIAGNOSIS — M542 Cervicalgia: Secondary | ICD-10-CM | POA: Diagnosis present

## 2017-11-29 DIAGNOSIS — R262 Difficulty in walking, not elsewhere classified: Secondary | ICD-10-CM | POA: Diagnosis present

## 2017-11-29 DIAGNOSIS — M6281 Muscle weakness (generalized): Secondary | ICD-10-CM | POA: Insufficient documentation

## 2017-11-29 DIAGNOSIS — G8929 Other chronic pain: Secondary | ICD-10-CM | POA: Diagnosis present

## 2017-11-29 DIAGNOSIS — R293 Abnormal posture: Secondary | ICD-10-CM

## 2017-11-29 NOTE — Therapy (Addendum)
[facility] [address]  [place], [place], [address] Phone: [phone]   Fax:  [phone]  Physical Therapy Evaluation  Patient Details  Name: Autumn Bowen MRN: [id number] Date of Birth: 02/03/1946 Referring Provider: Aundria Mems, MD   Encounter Date: 11/29/2017  PT End of Session - 11/29/17 1810    Visit Number  1    Number of Visits  17    Date for PT Re-Evaluation  01/24/18    Authorization Type  [employer]    PT Start Time  2423    PT Stop Time  1620    PT Time Calculation (min)  45 min    Activity Tolerance  Patient tolerated treatment well;Patient limited by pain    Behavior During Therapy  [facility] for tasks assessed/performed       Past Medical History:  Diagnosis Date  . Allergy    Notes that grass and some trees causes eyes to sting, burn, and water.  . Anemia   . Celiac disease   . Celiac disease   . COPD (chronic obstructive pulmonary disease) ([place])   . Diabetes mellitus type II   . Diverticulitis   . Dyslipidemia   . Hypothyroidism   . Sore throat     Past Surgical History:  Procedure Laterality Date  . APPENDECTOMY    . BREAST SURGERY     h/o benign cystleft breast and lymp node removal on right breast area.  Marland Kitchen EYE SURGERY     Left eye cataract removal, right eye h/o macular degeneration.  Marland Kitchen SPINE SURGERY     Pt not sure of the type of surgery she had but knows it was L4-5.  Marland Kitchen VAGINAL HYSTERECTOMY      There were no vitals filed for this visit.   Subjective Assessment - 11/29/17 1540    Subjective  Patient reports she has a several year history of back and neck pain- says she blames it on her knees and also her weight. Reports she had L4/5 laminectomy/decompression in the 1990's. Reports neck pain is more of a concerns than the back pain. Has trouble with walking d/t vertigo. Plays Mahjong games and reports she has to hold her head up after playing for a while because  her neck can't support her head.  Has been having HAs when she goes to bed and when she wakes up. Has hx of migraines but they are under control.    Pertinent History  anemia, celiac disease, COPD, chronic back and knee pain, DM, diverticulitis, HLD,hypothyroidism, pseudogout L knee, L4/5 laminectomy and decompression     Limitations  Sitting;Lifting;Walking;House hold activities;Standing    How long can you sit comfortably?  0 minutes    How long can you stand comfortably?  0 minutes    How long can you walk comfortably?  0 minutes    Diagnostic tests  11/16/17 lumbar xray: Degenerative change with anterolisthesis of L4 on L5. 11/16/17 cervical xray: Multilevel degenerative change without acute abnormality    Patient Stated Goals  swim in a pool and take up tai chi    Currently in Pain?  Yes    Pain Score  7     Pain Location  Neck    Pain Orientation  Right;Posterior    Pain Descriptors / Indicators  Sharp;Burning    Pain Type  Chronic pain    Aggravating Factors   neck flexion, turning head    Multiple Pain Sites  Yes    Pain Score  4    Pain Location  Back    Pain Orientation  Left;Lower    Pain Descriptors / Indicators  Sharp    Pain Type  Chronic pain    Pain Radiating Towards  down L leg and into heel    Aggravating Factors   bending forward, reaching up, making the bed, lifting 1 gallon of milk    Pain Relieving Factors  meds, rest         [facility] PT Assessment - 11/29/17 1601      Assessment   Medical Diagnosis  Chronic L sided LBP with L sciatica    Referring Provider  Aundria Mems, MD    Onset Date/Surgical Date  -- several years duration    Prior Therapy  Yes- polio as child      Precautions   Precautions  None      Restrictions   Weight Bearing Restrictions  No      Balance Screen   Has the patient fallen in the past 6 months  Yes    How many times?  1 walking up steps without railing- exaccerbated LBP    Has the patient had a decrease in activity level  because of a fear of falling?   Yes    Is the patient reluctant to leave their home because of a fear of falling?   No      Home Social worker  Private residence    Living Arrangements  Alone    Available Help at Discharge  -- none    Type of [place] to enter    Entrance Stairs-Number of Steps  5    Entrance Stairs-Rails  None    Home Layout  One level    [place] - 4 wheels      Prior Function   Level of Independence  Independent    Vocation  Retired      Associate Professor   Overall Cognitive Status  Within Functional Limits for tasks assessed difficulty staying on task      Observation/Other Assessments   Focus on Therapeutic Outcomes (FOTO)   Lumbar: 39 (61% limited, 60% predicted)      Sensation   Light Touch  Appears Intact      Coordination   Gross Motor Movements are Fluid and Coordinated  No limited by pain      Posture/Postural Control   Posture/Postural Control  Postural limitations    Postural Limitations  Rounded Shoulders;Forward head;Flexed trunk      ROM / Strength   AROM / PROM / Strength  Strength;AROM      AROM   AROM Assessment Site  Lumbar;Cervical    Cervical Flexion  28    Cervical Extension  30 pain    Cervical - Right Side Bend  20    Cervical - Left Side Bend  20    Cervical - Right Rotation  severely limited    Cervical - Left Rotation  mildly limited    Lumbar Flexion  mid shin    Lumbar Extension  moderately limited pain in neck    Lumbar - Right Side Bend  mid thigh pain    Lumbar - Left Side Bend  distal thigh    Lumbar - Right Rotation  moderately limited pain in back    Lumbar - Left Rotation  mildly limited  Strength   Strength Assessment Site  Hip;Knee;Ankle    Right/Left Hip  Right;Left    Right Hip Flexion  4-/5 pain in back    Right Hip ABduction  4/5    Right Hip ADduction  4/5    Left Hip Flexion  4/5    Left Hip ABduction  4/5    Left Hip ADduction  4/5     Right/Left Knee  Right;Left    Right Knee Flexion  4-/5 pain in back    Right Knee Extension  4/5 pain in back    Left Knee Flexion  4/5    Left Knee Extension  4/5    Right/Left Ankle  Right;Left    Right Ankle Dorsiflexion  4/5    Right Ankle Plantar Flexion  4/5    Left Ankle Dorsiflexion  4/5    Left Ankle Plantar Flexion  4/5      Palpation   Palpation comment  TTP and soft tissue restriction in R suboccipitals and UT, L glute and piriformis      Ambulation/Gait   Assistive device  None    Gait Pattern  Step-to pattern;Decreased step length - left;Decreased step length - right;Trunk flexed;Poor foot clearance - right;Poor foot clearance - left    Ambulation Surface  Level;Indoor    Gait velocity  severely decreased                Objective measurements completed on examination: See above findings.              PT Education - 11/29/17 1810    Education Details  prognosis, HEP, POC    Person(s) Educated  Patient    Methods  Explanation;Demonstration;Tactile cues;Verbal cues;Handout    Comprehension  Returned demonstration;Verbalized understanding       PT Short Term Goals - 11/29/17 1824      PT SHORT TERM GOAL #1   Title  Patient to be independent with initial HEP.    Time  4    Period  Weeks    Status  New    Target Date  12/27/17        PT Long Term Goals - 11/29/17 1824      PT LONG TERM GOAL #1   Title  Patient to be independent with advanced HEP.    Time  8    Period  Weeks    Status  New    Target Date  01/24/18      PT LONG TERM GOAL #2   Title  Patient to demonstrate [facility] lumbar AROM without pain limiting.    Time  8    Period  Weeks    Status  New    Target Date  01/24/18      PT LONG TERM GOAL #3   Title  Patient to demonstrate [facility] cervical AROM without pain limiting.    Time  8    Period  Weeks    Status  New    Target Date  01/24/18      PT LONG TERM GOAL #4   Title  Patient to demonstrate >=4+/5 strength in B LEs.     Time  8    Period  Weeks    Status  New    Target Date  01/24/18      PT LONG TERM GOAL #5   Title  Patient to tolerate 1 hour of reading or playing games without c/o neck pain.    Time  8  Period  Weeks    Status  New    Target Date  01/24/18      Additional Long Term Goals   Additional Long Term Goals  Yes      PT LONG TERM GOAL #6   Title  Patient to increase gait speed to >2.62 ft/sec with LRAD and no evidence of instability.     Time  8    Period  Weeks    Status  New    Target Date  01/24/18             Plan - 11/29/17 1822    Clinical Impression Statement  Patient is a 72y/o F presenting to OPPT with c/o chronic neck and LBP. Reports hx of L4/5 laminectomy/decompression in the 1990's. Patient with difficulty explaining aggravating and easing factors- with prolonged reading/playing games, rotating head, and lumbar flexion/extension and lifting as being most aggravating. Patient today with decreased and painful lumbar and cervical AROM, decreased LE strength, gait deviations, and soft tissue restriction and tenderness in R suboccipitals, UT, L glute, L piriformis. Patient educated and received handout o gentle cervical and lumbar mobility exercises. Advised not to push into pain. Reported understanding.     Clinical Presentation  Stable    Clinical Presentation due to:  anemia, celiac disease, COPD, chronic back and knee pain, DM, diverticulitis, HLD,hypothyroidism, pseudogout L knee, L4/5 laminectomy and decompression in 1990's    Clinical Decision Making  Low    Rehab Potential  Good    PT Frequency  2x / week    PT Duration  8 weeks    PT Treatment/Interventions  ADLs/Self Care Home Management;Cryotherapy;Electrical Stimulation;Moist Heat;Ultrasound;Gait training;Stair training;Functional mobility training;Therapeutic activities;Therapeutic exercise;Manual techniques;Patient/family education;Neuromuscular re-education;Balance training;Passive range of motion;Dry  needling;Energy conservation;Splinting;Taping;Vasopneumatic Device    PT Next Visit Plan  reassess HEP, measure gait speed    Consulted and Agree with Plan of Care  Patient       Patient will benefit from skilled therapeutic intervention in order to improve the following deficits and impairments:  Abnormal gait, Decreased endurance, Hypomobility, Decreased activity tolerance, Decreased strength, Pain, Decreased balance, Decreased mobility, Difficulty walking, Improper body mechanics, Decreased range of motion, Postural dysfunction  Visit Diagnosis: Cervicalgia  Chronic left-sided low back pain with left-sided sciatica  Muscle weakness (generalized)  Difficulty in walking, not elsewhere classified  Abnormal posture     Problem List Patient Active Problem List   Diagnosis Date Noted  . Pseudogout of left knee 11/16/2017  . Hypotension 09/22/2016  . Osteopenia 05/24/2016  . Abdominal pain 04/12/2016  . Diverticulitis of colon 04/12/2016  . Broken or cracked tooth, nontraumatic 03/27/2016  . Memory deficit 05/05/2015  . Balance problems 05/05/2015  . Hearing difficulty of both ears 05/05/2015  . Mouth lesion 05/04/2015  . OSA (obstructive sleep apnea) 04/28/2015  . Seborrheic keratoses 01/31/2015  . Left-sided low back pain with left-sided sciatica 01/31/2015  . GERD (gastroesophageal reflux disease) 01/26/2015  . Celiac disease 01/26/2015  . Cervical spondylosis 12/04/2014  . Osteoarthritis of first metatarsophalangeal joint 11/17/2014  . Ingrown left big toenail 10/04/2014  . Seasonal allergies 10/04/2014  . Neuropathy 10/04/2014  . Toenail fungus 10/03/2014  . Adenomatous polyp of colon 07/30/2014  . Macular degeneration, dry 07/29/2014  . Type 2 diabetes mellitus with complication ([place]) 38/02/1750  . Migraine without aura and without status migrainosus, not intractable 07/28/2014  . EMPHYSEMA 05/11/2009  . COPD (chronic obstructive pulmonary disease) with chronic  bronchitis ([place]) 05/10/2009  . VISION DISORDER  12/03/2008  . OSTEOPENIA 11/18/2008  . VITAMIN D DEFICIENCY 11/06/2008  . Major depressive disorder 11/05/2008  . FATIGUE 11/05/2008  . ANEMIA 10/23/2008  . DYSLIPIDEMIA 10/16/2008  . TOBACCO ABUSE 10/15/2008  . ALLERGIC RHINITIS 10/15/2008  . FRACTURE, ANKLE, RIGHT 04/29/2008  . DIARRHEA 07/01/2007  . POST TRAUMATIC STRESS SYNDROME 05/30/2000  . Hypothyroidism 05/30/1988  . POSTMENOPAUSAL STATUS 05/30/1968    Janene Harvey, PT, DPT 11/29/17 6:30 PM   [facility] [address]  [place], [place], [address] Phone: [phone]   Fax:  [phone]  Name: ZIAN MOHAMED MRN: [id number] Date of Birth: Aug 25, 1945  PHYSICAL THERAPY DISCHARGE SUMMARY  Visits from Start of Care: 1  Current functional level related to goals / functional outcomes: Unable to assess; patient did not return d/t change in medical status   Remaining deficits: See above   Education / Equipment: See above  Plan: Patient agrees to discharge.  Patient goals were not met. Patient is being discharged due to a change in medical status.  ?????     Janene Harvey, PT, DPT 12/29/17 8:33 AM

## 2017-12-05 ENCOUNTER — Encounter: Payer: Self-pay | Admitting: Cardiology

## 2017-12-05 ENCOUNTER — Ambulatory Visit (INDEPENDENT_AMBULATORY_CARE_PROVIDER_SITE_OTHER): Payer: Medicare HMO | Admitting: Cardiology

## 2017-12-05 VITALS — BP 130/62 | HR 81 | Ht 63.0 in | Wt 213.4 lb

## 2017-12-05 DIAGNOSIS — F17210 Nicotine dependence, cigarettes, uncomplicated: Secondary | ICD-10-CM | POA: Diagnosis not present

## 2017-12-05 DIAGNOSIS — Z91041 Radiographic dye allergy status: Secondary | ICD-10-CM | POA: Diagnosis not present

## 2017-12-05 DIAGNOSIS — I209 Angina pectoris, unspecified: Secondary | ICD-10-CM

## 2017-12-05 DIAGNOSIS — J449 Chronic obstructive pulmonary disease, unspecified: Secondary | ICD-10-CM

## 2017-12-05 DIAGNOSIS — F17200 Nicotine dependence, unspecified, uncomplicated: Secondary | ICD-10-CM

## 2017-12-05 DIAGNOSIS — G4733 Obstructive sleep apnea (adult) (pediatric): Secondary | ICD-10-CM

## 2017-12-05 DIAGNOSIS — E088 Diabetes mellitus due to underlying condition with unspecified complications: Secondary | ICD-10-CM | POA: Diagnosis not present

## 2017-12-05 MED ORDER — ASPIRIN EC 81 MG PO TBEC: 81.0000 mg | DELAYED_RELEASE_TABLET | Freq: Every day | ORAL | 3 refills | Status: AC

## 2017-12-05 MED ORDER — PREDNISONE 50 MG PO TABS: ORAL_TABLET | ORAL | 0 refills | Status: DC

## 2017-12-05 MED ORDER — NITROGLYCERIN 0.4 MG SL SUBL: 0.4000 mg | SUBLINGUAL_TABLET | SUBLINGUAL | 11 refills | Status: DC | PRN

## 2017-12-05 NOTE — Patient Instructions (Addendum)
Medication Instructions:  Your physician has recommended you make the following change in your medication:   START nitroglycerin as needed for chest pain: When having chest pain, stop what you are doing and sit down. Take 1 nitro, wait 5 minutes. Still having chest pain, take 1 nitro, wait 5 minutes. Still having chest pain, take 1 nitro, dial 911. Total of 3 nitro in 15 minutes.   Labwork: None  Testing/Procedures: You had an EKG today.     [facility] [place] [address] [place] [address] Dept: [phone] Loc: [place]  12/05/2017  You are scheduled for a Cardiac Catheterization on Thursday, July 11 with Dr. Glenetta Hew.  1. Please arrive at the [facility] (Main Entrance A) at [facility] [place], [place] [address] at 10:00 AM (two hours before your procedure to ensure your preparation). Free valet parking service is available.   Special note: Every effort is made to have your procedure done on time. Please understand that emergencies sometimes delay scheduled procedures.  2. Diet: Do not eat or drink anything after midnight prior to your procedure except sips of water to take medications.  3. Labs: None needed.  4. Medication instructions in preparation for your procedure:  Stop taking, Glucophage (Metformin), Aleve or Naprosyn (Naproxen) on Thursday, July 11. Please hold metformin for 48 hours after the procedure.   Take only 9 units of insulin the night before your procedure. Do not take any insulin on the day of the procedure.   Due to your contrast allergy, please follow these instructions very carefully: 13 hours prior to procedure time, take Prednisone 50 mg (1 tablet). 7 hours before the procedure time, take prednisone 50 mg (1 tablet). 1-2 hours before procedure time (just before leaving for the hospital), take prednisone 50 mg (1  tablet) AND benadryl 50 mg. A prescription has been sent in for the prednisone and you will get benadryl over the counter.   On the morning of your procedure, take your Aspirin and any morning medicines NOT listed above.  You may use sips of water.  5. Plan for one night stay--bring personal belongings. 6. Bring a current list of your medications and current insurance cards. 7. You MUST have a responsible person to drive you home. 8. Someone MUST be with you the first 24 hours after you arrive home or your discharge will be delayed. 9. Please wear clothes that are easy to get on and off and wear slip-on shoes.  Thank you for allowing [place] to care for you!   -- [facility]     Follow-Up: Your physician recommends that you schedule a follow-up appointment as the hospital recommends at discharge after heart catheterization.   If you need a refill on your cardiac medications before your next appointment, please call your pharmacy.   Thank you for choosing [facility]! Robyne Peers, RN [phone]

## 2017-12-05 NOTE — Addendum Note (Signed)
Addended by: Jyl Heinz R on: 12/05/2017 01:34 PM   Modules accepted: Orders, SmartSet

## 2017-12-05 NOTE — H&P (View-Only) (Signed)
Cardiology Office Note:    Date:  12/05/2017   ID:  Autumn Bowen, DOB Aug 17, 1945, MRN [id number]  PCP:  Emeterio Reeve, DO  Cardiologist:  Jenean Lindau, MD   Referring MD: Emeterio Reeve, DO    ASSESSMENT:    1. Angina pectoris ([place])   2. COPD (chronic obstructive pulmonary disease) with chronic bronchitis ([place])   3. OSA (obstructive sleep apnea)   4. TOBACCO ABUSE   5. Diabetes mellitus due to underlying condition with complication, without long-term current use of insulin (HCC)   6. Radiographic dye allergy status    PLAN:    In order of problems listed above:  1. Primary prevention stressed with the patient.  Importance of compliance with diet and medications stressed and she vocalized understanding. 2. I spent 5 minutes with the patient discussing solely about smoking. Smoking cessation was counseled. I suggested to the patient also different medications and pharmacological interventions. Patient is keen to try stopping on its own at this time. He will get back to me if he needs any further assistance in this matter. 3. Diet was discussed for obesity and risks of obesity explained and she vocalized understanding. 4. In view of the patient's symptoms, I discussed with the patient options for evaluation. Invasive and noninvasive options were given to the patient. I discussed stress testing and coronary angiography and left heart catheterization at length. Benefits, pros and cons of each approach were discussed at length. Patient had multiple questions which were answered to the patient's satisfaction. Patient opted for invasive evaluation and we will set up for coronary angiography and left heart catheterization. Further recommendations will be made based on the findings with coronary angiography. In the interim if the patient has any significant symptoms in hospital to the nearest emergency room. 5. It appears that she has significant dye allergy and I specifically  mentioned the risks of this situation and that we will have to admit her the previous night to follow protocol for the allergy and she is agreeable.  She will be seen in follow-up appointment after the coronary angiography. 6. Sublingual nitroglycerin prescription was sent, its protocol and 911 protocol explained and the patient vocalized understanding questions were answered to the patient's satisfaction   Medication Adjustments/Labs and Tests Ordered: Current medicines are reviewed at length with the patient today.  Concerns regarding medicines are outlined above.  No orders of the defined types were placed in this encounter.  No orders of the defined types were placed in this encounter.    History of Present Illness:    Autumn Bowen is a 72 y.o. female who is being seen today for the evaluation of angina pectoris at the request of Emeterio Reeve, DO.  Patient is a pleasant 72 year old female.  She has past medical history of diabetes mellitus and dyslipidemia.  She mentions to me that she has been smoker since young age.  She leads a sedentary lifestyle because of orthopedic issues.  She has sleep apnea and COPD.  She is significantly overweight.  She is tells me that she has been referred here.  The patient mentions to me that when she tries to exert herself she has chest tightness going to the neck into the arm..  This is of concern to her.  This is been happening recurrently and therefore she is come here for evaluation.  At the time of my evaluation, the patient is alert awake oriented and in no distress.  She says she takes aspirin  on a regular basis.  Past Medical History:  Diagnosis Date  . Allergy    Notes that grass and some trees causes eyes to sting, burn, and water.  . Anemia   . Celiac disease   . Celiac disease   . COPD (chronic obstructive pulmonary disease) ([place])   . Diabetes mellitus type II   . Diverticulitis   . Dyslipidemia   . Hypothyroidism   . Sore throat      Past Surgical History:  Procedure Laterality Date  . APPENDECTOMY    . BREAST SURGERY     h/o benign cystleft breast and lymp node removal on right breast area.  Marland Kitchen EYE SURGERY     Left eye cataract removal, right eye h/o macular degeneration.  Marland Kitchen SPINE SURGERY     Pt not sure of the type of surgery she had but knows it was L4-5.  Marland Kitchen VAGINAL HYSTERECTOMY      Current Medications: Current Meds  Medication Sig  . ACCU-CHEK AVIVA PLUS test strip test 1-2 TIMES daily  . albuterol (PROVENTIL HFA;VENTOLIN HFA) 108 (90 Base) MCG/ACT inhaler Inhale 2 puffs into the lungs every 6 (six) hours as needed for wheezing or shortness of breath.  . AMBULATORY NON FORMULARY MEDICATION Glucometer, test strips and lancets for testing once a day for DM, uncontrolled.  Marland Kitchen amitriptyline (ELAVIL) 50 MG tablet TAKE 1 TABLET BY MOUTH EVERY NIGHT AT BEDTIME  . CHANTIX CONTINUING MONTH PAK 1 MG tablet TAKE 1 TABLET BY MOUTH TWICE DAILY( EVERY TWELVE HOURS)  . dicyclomine (BENTYL) 10 MG capsule Take 1 capsule (10 mg total) 2 (two) times daily by mouth.  . EPIPEN 2-PAK 0.3 MG/0.3ML SOAJ injection USE AS DIRECTED  . ezetimibe (ZETIA) 10 MG tablet Take 1 tablet (10 mg total) by mouth daily.  . Fluticasone-Salmeterol (ADVAIR) 250-50 MCG/DOSE AEPB Inhale 1 puff into the lungs every 12 (twelve) hours.  Marland Kitchen GLUCOSAMINE-CHONDROITIN PO Take by mouth.  . Insulin Degludec (TRESIBA FLEXTOUCH) 200 UNIT/ML SOPN Inject 16 Units into the skin at bedtime. Increase by 2 units every 5 days until fasting glucose 90-120.  Marland Kitchen levocetirizine (XYZAL) 5 MG tablet Take 5 mg by mouth every evening.  Marland Kitchen levothyroxine (SYNTHROID, LEVOTHROID) 150 MCG tablet Take 1 tablet (150 mcg total) by mouth daily before breakfast. (Patient taking differently: Take 175 mcg by mouth daily before breakfast. )  . LUMIGAN 0.01 % SOLN Instill 1 drop into both eyes every night  . LYRICA 50 MG capsule TAKE ONE CAPSULE TWICE DAILY BY MOUTH  . metFORMIN  (GLUCOPHAGE-XR) 750 MG 24 hr tablet Take 1 tablet (750 mg total) by mouth daily with breakfast.  . NAPROXEN SODIUM PO Take 220 mg by mouth 2 (two) times daily as needed.  . ondansetron (ZOFRAN) 8 MG tablet TAKE 1 TABLET BY MOUTH EVERY 8 HOURS AS NEEDED FOR NAUSEA AND VOMITING  . promethazine (PHENERGAN) 25 MG tablet Take 1 tablet (25 mg total) by mouth every 6 (six) hours as needed for nausea or vomiting.  Marland Kitchen QUEtiapine (SEROQUEL) 200 MG tablet Take 1 tablet (200 mg total) by mouth at bedtime.  . sertraline (ZOLOFT) 100 MG tablet Take 2 tablets (200 mg total) by mouth daily.  . SURE COMFORT PEN NEEDLES 32G X 4 MM MISC USE AS DIRECTED  . triamcinolone cream (KENALOG) 0.1 % Apply 1 application topically 2 (two) times daily. For itchy bumps.     Allergies:   Bee venom; Iodinated diagnostic agents; Penicillins; Povidone iodine; Tomato; Chocolate; Latex; Other;  Atorvastatin; Atorvastatin calcium; Esomeprazole magnesium; Esomeprazole magnesium; Gluten meal; Metformin and related; Quetiapine fumarate; Trazodone and nefazodone; and Zolpidem tartrate   Social History   Socioeconomic History  . Marital status: Divorced    Spouse name: Not on file  . Number of children: Not on file  . Years of education: Not on file  . Highest education level: Not on file  Occupational History  . Not on file  Social Needs  . Financial resource strain: Not on file  . Food insecurity:    Worry: Not on file    Inability: Not on file  . Transportation needs:    Medical: Not on file    Non-medical: Not on file  Tobacco Use  . Smoking status: Current Every Day Smoker    Packs/day: 0.25    Years: 51.00    Pack years: 12.75    Types: Cigarettes    Start date: 01/02/2012  . Smokeless tobacco: Never Used  Substance and Sexual Activity  . Alcohol use: No  . Drug use: No  . Sexual activity: Not Currently  Lifestyle  . Physical activity:    Days per week: Not on file    Minutes per session: Not on file  . Stress:  Not on file  Relationships  . Social connections:    Talks on phone: Not on file    Gets together: Not on file    Attends religious service: Not on file    Active member of club or organization: Not on file    Attends meetings of clubs or organizations: Not on file    Relationship status: Not on file  Other Topics Concern  . Not on file  Social History Narrative  . Not on file     Family History: The patient's family history includes Alcohol abuse in her brother; Anxiety disorder in her mother; Asthma in her son; COPD in her father and sister; Cancer in her sister; Depression in her mother; Diabetes in her father and son; Diabetes type II in her mother; Drug abuse in her brother; Glaucoma in her father; Hashimoto's thyroiditis in her sister; Heart attack in her mother; Hypertension in her father; Kidney disease in her sister; Stroke in her mother.  ROS:   Please see the history of present illness.    All other systems reviewed and are negative.  EKGs/Labs/Other Studies Reviewed:    The following studies were reviewed today: Sinus rhythm and nonspecific ST-T changes.   Recent Labs: 11/27/2017: ALT 13; BUN 16; Creat 0.73; Hemoglobin 13.6; Platelets 321; Potassium 4.8; Sodium 141; TSH 11.75  Recent Lipid Panel    Component Value Date/Time   CHOL 160 11/27/2017 1420   TRIG 135 11/27/2017 1420   HDL 65 11/27/2017 1420   CHOLHDL 2.5 11/27/2017 1420   VLDL 43 (H) 12/28/2016 1158   LDLCALC 73 11/27/2017 1420    Physical Exam:    VS:  BP 130/62   Pulse 81   Ht 5' 3"  (1.6 m)   Wt 213 lb 6.4 oz (96.8 kg)   LMP 09/21/1974   SpO2 97%   BMI 37.80 kg/m     Wt Readings from Last 3 Encounters:  12/05/17 213 lb 6.4 oz (96.8 kg)  11/27/17 211 lb 3.2 oz (95.8 kg)  10/17/17 212 lb 8 oz (96.4 kg)     GEN: Patient is in no acute distress HEENT: Normal NECK: No JVD; No carotid bruits LYMPHATICS: No lymphadenopathy CARDIAC: S1 S2 regular, 2/6 systolic murmur at the  apex. RESPIRATORY:  Clear to auscultation without rales, wheezing or rhonchi  ABDOMEN: Soft, non-tender, non-distended MUSCULOSKELETAL:  No edema; No deformity  SKIN: Warm and dry NEUROLOGIC:  Alert and oriented x 3 PSYCHIATRIC:  Normal affect    Signed, Jenean Lindau, MD  12/05/2017 12:22 PM    [facility]

## 2017-12-05 NOTE — Progress Notes (Signed)
Cardiology Office Note:    Date:  12/05/2017   ID:  Autumn Bowen, DOB 1946-01-27, MRN [id number]  PCP:  Autumn Reeve, DO  Cardiologist:  Autumn Lindau, MD   Referring MD: Autumn Reeve, DO    ASSESSMENT:    1. Angina pectoris ([place])   2. COPD (chronic obstructive pulmonary disease) with chronic bronchitis ([place])   3. OSA (obstructive sleep apnea)   4. TOBACCO ABUSE   5. Diabetes mellitus due to underlying condition with complication, without long-term current use of insulin (HCC)   6. Radiographic dye allergy status    PLAN:    In order of problems listed above:  1. Primary prevention stressed with the patient.  Importance of compliance with diet and medications stressed and she vocalized understanding. 2. I spent 5 minutes with the patient discussing solely about smoking. Smoking cessation was counseled. I suggested to the patient also different medications and pharmacological interventions. Patient is keen to try stopping on its own at this time. He will get back to me if he needs any further assistance in this matter. 3. Diet was discussed for obesity and risks of obesity explained and she vocalized understanding. 4. In view of the patient's symptoms, I discussed with the patient options for evaluation. Invasive and noninvasive options were given to the patient. I discussed stress testing and coronary angiography and left heart catheterization at length. Benefits, pros and cons of each approach were discussed at length. Patient had multiple questions which were answered to the patient's satisfaction. Patient opted for invasive evaluation and we will set up for coronary angiography and left heart catheterization. Further recommendations will be made based on the findings with coronary angiography. In the interim if the patient has any significant symptoms in hospital to the nearest emergency room. 5. It appears that she has significant dye allergy and I specifically  mentioned the risks of this situation and that we will have to admit her the previous night to follow protocol for the allergy and she is agreeable.  She will be seen in follow-up appointment after the coronary angiography. 6. Sublingual nitroglycerin prescription was sent, its protocol and 911 protocol explained and the patient vocalized understanding questions were answered to the patient's satisfaction   Medication Adjustments/Labs and Tests Ordered: Current medicines are reviewed at length with the patient today.  Concerns regarding medicines are outlined above.  No orders of the defined types were placed in this encounter.  No orders of the defined types were placed in this encounter.    History of Present Illness:    Autumn Bowen is a 72 y.o. female who is being seen today for the evaluation of angina pectoris at the request of Autumn Reeve, DO.  Patient is a pleasant 72 year old female.  She has past medical history of diabetes mellitus and dyslipidemia.  She mentions to me that she has been smoker since young age.  She leads a sedentary lifestyle because of orthopedic issues.  She has sleep apnea and COPD.  She is significantly overweight.  She is tells me that she has been referred here.  The patient mentions to me that when she tries to exert herself she has chest tightness going to the neck into the arm..  This is of concern to her.  This is been happening recurrently and therefore she is come here for evaluation.  At the time of my evaluation, the patient is alert awake oriented and in no distress.  She says she takes aspirin  on a regular basis.  Past Medical History:  Diagnosis Date  . Allergy    Notes that grass and some trees causes eyes to sting, burn, and water.  . Anemia   . Celiac disease   . Celiac disease   . COPD (chronic obstructive pulmonary disease) ([place])   . Diabetes mellitus type II   . Diverticulitis   . Dyslipidemia   . Hypothyroidism   . Sore throat      Past Surgical History:  Procedure Laterality Date  . APPENDECTOMY    . BREAST SURGERY     h/o benign cystleft breast and lymp node removal on right breast area.  Mar[facility] Kitchen EYE SURGERY     Left eye cataract removal, right eye h/o macular degeneration.  Marland Kitchen SPINE SURGERY     Pt not sure of the type of surgery she had but knows it was L4-5.  Marland Kitchen VAGINAL HYSTERECTOMY      Current Medications: Current Meds  Medication Sig  . ACCU-CHEK AVIVA PLUS test strip test 1-2 TIMES daily  . albuterol (PROVENTIL HFA;VENTOLIN HFA) 108 (90 Base) MCG/ACT inhaler Inhale 2 puffs into the lungs every 6 (six) hours as needed for wheezing or shortness of breath.  . AMBULATORY NON FORMULARY MEDICATION Glucometer, test strips and lancets for testing once a day for DM, uncontrolled.  Marland Kitchen amitriptyline (ELAVIL) 50 MG tablet TAKE 1 TABLET BY MOUTH EVERY NIGHT AT BEDTIME  . CHANTIX CONTINUING MONTH PAK 1 MG tablet TAKE 1 TABLET BY MOUTH TWICE DAILY( EVERY TWELVE HOURS)  . dicyclomine (BENTYL) 10 MG capsule Take 1 capsule (10 mg total) 2 (two) times daily by mouth.  . EPIPEN 2-PAK 0.3 MG/0.3ML SOAJ injection USE AS DIRECTED  . ezetimibe (ZETIA) 10 MG tablet Take 1 tablet (10 mg total) by mouth daily.  . Fluticasone-Salmeterol (ADVAIR) 250-50 MCG/DOSE AEPB Inhale 1 puff into the lungs every 12 (twelve) hours.  Marland Kitchen GLUCOSAMINE-CHONDROITIN PO Take by mouth.  . Insulin Degludec (TRESIBA FLEXTOUCH) 200 UNIT/ML SOPN Inject 16 Units into the skin at bedtime. Increase by 2 units every 5 days until fasting glucose 90-120.  Marland Kitchen levocetirizine (XYZAL) 5 MG tablet Take 5 mg by mouth every evening.  Marland Kitchen levothyroxine (SYNTHROID, LEVOTHROID) 150 MCG tablet Take 1 tablet (150 mcg total) by mouth daily before breakfast. (Patient taking differently: Take 175 mcg by mouth daily before breakfast. )  . LUMIGAN 0.01 % SOLN Instill 1 drop into both eyes every night  . LYRICA 50 MG capsule TAKE ONE CAPSULE TWICE DAILY BY MOUTH  . metFORMIN  (GLUCOPHAGE-XR) 750 MG 24 hr tablet Take 1 tablet (750 mg total) by mouth daily with breakfast.  . NAPROXEN SODIUM PO Take 220 mg by mouth 2 (two) times daily as needed.  . ondansetron (ZOFRAN) 8 MG tablet TAKE 1 TABLET BY MOUTH EVERY 8 HOURS AS NEEDED FOR NAUSEA AND VOMITING  . promethazine (PHENERGAN) 25 MG tablet Take 1 tablet (25 mg total) by mouth every 6 (six) hours as needed for nausea or vomiting.  Marland Kitchen QUEtiapine (SEROQUEL) 200 MG tablet Take 1 tablet (200 mg total) by mouth at bedtime.  . sertraline (ZOLOFT) 100 MG tablet Take 2 tablets (200 mg total) by mouth daily.  . SURE COMFORT PEN NEEDLES 32G X 4 MM MISC USE AS DIRECTED  . triamcinolone cream (KENALOG) 0.1 % Apply 1 application topically 2 (two) times daily. For itchy bumps.     Allergies:   Bee venom; Iodinated diagnostic agents; Penicillins; Povidone iodine; Tomato; Chocolate; Latex; Other;  Atorvastatin; Atorvastatin calcium; Esomeprazole magnesium; Esomeprazole magnesium; Gluten meal; Metformin and related; Quetiapine fumarate; Trazodone and nefazodone; and Zolpidem tartrate   Social History   Socioeconomic History  . Marital status: Divorced    Spouse name: Not on file  . Number of children: Not on file  . Years of education: Not on file  . Highest education level: Not on file  Occupational History  . Not on file  Social Needs  . Financial resource strain: Not on file  . Food insecurity:    Worry: Not on file    Inability: Not on file  . Transportation needs:    Medical: Not on file    Non-medical: Not on file  Tobacco Use  . Smoking status: Current Every Day Smoker    Packs/day: 0.25    Years: 51.00    Pack years: 12.75    Types: Cigarettes    Start date: 01/02/2012  . Smokeless tobacco: Never Used  Substance and Sexual Activity  . Alcohol use: No  . Drug use: No  . Sexual activity: Not Currently  Lifestyle  . Physical activity:    Days per week: Not on file    Minutes per session: Not on file  . Stress:  Not on file  Relationships  . Social connections:    Talks on phone: Not on file    Gets together: Not on file    Attends religious service: Not on file    Active member of club or organization: Not on file    Attends meetings of clubs or organizations: Not on file    Relationship status: Not on file  Other Topics Concern  . Not on file  Social History Narrative  . Not on file     Family History: The patient's family history includes Alcohol abuse in her brother; Anxiety disorder in her mother; Asthma in her son; COPD in her father and sister; Cancer in her sister; Depression in her mother; Diabetes in her father and son; Diabetes type II in her mother; Drug abuse in her brother; Glaucoma in her father; Hashimoto's thyroiditis in her sister; Heart attack in her mother; Hypertension in her father; Kidney disease in her sister; Stroke in her mother.  ROS:   Please see the history of present illness.    All other systems reviewed and are negative.  EKGs/Labs/Other Studies Reviewed:    The following studies were reviewed today: Sinus rhythm and nonspecific ST-T changes.   Recent Labs: 11/27/2017: ALT 13; BUN 16; Creat 0.73; Hemoglobin 13.6; Platelets 321; Potassium 4.8; Sodium 141; TSH 11.75  Recent Lipid Panel    Component Value Date/Time   CHOL 160 11/27/2017 1420   TRIG 135 11/27/2017 1420   HDL 65 11/27/2017 1420   CHOLHDL 2.5 11/27/2017 1420   VLDL 43 (H) 12/28/2016 1158   LDLCALC 73 11/27/2017 1420    Physical Exam:    VS:  BP 130/62   Pulse 81   Ht 5' 3"  (1.6 m)   Wt 213 lb 6.4 oz (96.8 kg)   LMP 09/21/1974   SpO2 97%   BMI 37.80 kg/m     Wt Readings from Last 3 Encounters:  12/05/17 213 lb 6.4 oz (96.8 kg)  11/27/17 211 lb 3.2 oz (95.8 kg)  10/17/17 212 lb 8 oz (96.4 kg)     GEN: Patient is in no acute distress HEENT: Normal NECK: No JVD; No carotid bruits LYMPHATICS: No lymphadenopathy CARDIAC: S1 S2 regular, 2/6 systolic murmur at the  apex. RESPIRATORY:  Clear to auscultation without rales, wheezing or rhonchi  ABDOMEN: Soft, non-tender, non-distended MUSCULOSKELETAL:  No edema; No deformity  SKIN: Warm and dry NEUROLOGIC:  Alert and oriented x 3 PSYCHIATRIC:  Normal affect    Signed, Autumn Lindau, MD  12/05/2017 12:22 PM    [facility]

## 2017-12-06 ENCOUNTER — Telehealth: Payer: Self-pay | Admitting: *Deleted

## 2017-12-06 NOTE — Telephone Encounter (Signed)
Pt contacted pre-catheterization scheduled at [facility] for: Thursday December 07, 2017 12 noon Verified arrival time and place: [place] Entrance A at: 10 AM  No solid food after midnight prior to cath, clear liquids until 5 AM day of procedure.   Hold: Metformin AM of procedure and 48 hours post procedure. Insulin AM of procedure. 1/2 Insulin PM prior to procedure.   AM meds can be  taken pre-cath with sip of water including: ASA 81 mg Prednisone 50 mg  Benadryl 50 mg   Verified contrast allergy, emphasized  to pt the importance of taking 13 hour Prednisone and Benadryl prep pre procedure, pt verbalized understanding, agreed with plan.   Confirmed patient has responsible person to drive home post procedure and for 24 hours after you arrive home: yes

## 2017-12-06 NOTE — Telephone Encounter (Signed)
Patient called very concerned after speaking with a nurse over the phone to confirm medications are correct before heart catheterization scheduled for tomorrow, 12/07/17. Patient states that the nurse told her that she would not be staying in the hospital over night due to her severe contrast allergy. Patient wanted to confirm whether or not to expect to stay overnight. Contacted the cath lab and spoke with Crystal about this. Explained to her that Dr. [name] entered the orders as inpatient and per Dr. Julien Nordmann verbal order, he wants the patient to be monitored overnight in the hospital after procedure. Crystal made a note in the chart of this as well. Notified the patient who verbalized understanding. No further questions.

## 2017-12-07 ENCOUNTER — Encounter (HOSPITAL_COMMUNITY)
Admission: RE | Disposition: A | Discharge status: Home / Self Care | Payer: Self-pay | Source: Ambulatory Visit | Attending: Cardiology

## 2017-12-07 ENCOUNTER — Other Ambulatory Visit: Payer: Self-pay

## 2017-12-07 ENCOUNTER — Encounter (HOSPITAL_COMMUNITY): Payer: Self-pay | Admitting: General Practice

## 2017-12-07 ENCOUNTER — Ambulatory Visit (HOSPITAL_COMMUNITY)
Admission: RE | Admit: 2017-12-07 | Discharge: 2017-12-08 | Disposition: A | Discharge status: Home / Self Care | Payer: Medicare HMO | Source: Ambulatory Visit | Attending: Cardiology | Admitting: Cardiology

## 2017-12-07 DIAGNOSIS — Z91041 Radiographic dye allergy status: Secondary | ICD-10-CM | POA: Diagnosis not present

## 2017-12-07 DIAGNOSIS — F17210 Nicotine dependence, cigarettes, uncomplicated: Secondary | ICD-10-CM | POA: Diagnosis not present

## 2017-12-07 DIAGNOSIS — Z7951 Long term (current) use of inhaled steroids: Secondary | ICD-10-CM | POA: Diagnosis not present

## 2017-12-07 DIAGNOSIS — J449 Chronic obstructive pulmonary disease, unspecified: Secondary | ICD-10-CM | POA: Insufficient documentation

## 2017-12-07 DIAGNOSIS — Z79890 Hormone replacement therapy (postmenopausal): Secondary | ICD-10-CM | POA: Insufficient documentation

## 2017-12-07 DIAGNOSIS — Z90710 Acquired absence of both cervix and uterus: Secondary | ICD-10-CM | POA: Insufficient documentation

## 2017-12-07 DIAGNOSIS — Z6837 Body mass index (BMI) 37.0-37.9, adult: Secondary | ICD-10-CM | POA: Diagnosis not present

## 2017-12-07 DIAGNOSIS — G4733 Obstructive sleep apnea (adult) (pediatric): Secondary | ICD-10-CM | POA: Diagnosis not present

## 2017-12-07 DIAGNOSIS — Z83511 Family history of glaucoma: Secondary | ICD-10-CM | POA: Insufficient documentation

## 2017-12-07 DIAGNOSIS — Z8249 Family history of ischemic heart disease and other diseases of the circulatory system: Secondary | ICD-10-CM | POA: Diagnosis not present

## 2017-12-07 DIAGNOSIS — Z8349 Family history of other endocrine, nutritional and metabolic diseases: Secondary | ICD-10-CM | POA: Insufficient documentation

## 2017-12-07 DIAGNOSIS — Z79899 Other long term (current) drug therapy: Secondary | ICD-10-CM | POA: Diagnosis not present

## 2017-12-07 DIAGNOSIS — I25119 Atherosclerotic heart disease of native coronary artery with unspecified angina pectoris: Secondary | ICD-10-CM | POA: Diagnosis not present

## 2017-12-07 DIAGNOSIS — I209 Angina pectoris, unspecified: Secondary | ICD-10-CM | POA: Diagnosis not present

## 2017-12-07 DIAGNOSIS — Z833 Family history of diabetes mellitus: Secondary | ICD-10-CM | POA: Insufficient documentation

## 2017-12-07 DIAGNOSIS — Z823 Family history of stroke: Secondary | ICD-10-CM | POA: Diagnosis not present

## 2017-12-07 DIAGNOSIS — Z888 Allergy status to other drugs, medicaments and biological substances status: Secondary | ICD-10-CM | POA: Diagnosis not present

## 2017-12-07 DIAGNOSIS — Z7982 Long term (current) use of aspirin: Secondary | ICD-10-CM | POA: Diagnosis not present

## 2017-12-07 DIAGNOSIS — K900 Celiac disease: Secondary | ICD-10-CM | POA: Diagnosis not present

## 2017-12-07 DIAGNOSIS — E669 Obesity, unspecified: Secondary | ICD-10-CM | POA: Diagnosis not present

## 2017-12-07 DIAGNOSIS — Z8674 Personal history of sudden cardiac arrest: Secondary | ICD-10-CM | POA: Diagnosis not present

## 2017-12-07 DIAGNOSIS — Z7984 Long term (current) use of oral hypoglycemic drugs: Secondary | ICD-10-CM | POA: Diagnosis not present

## 2017-12-07 DIAGNOSIS — Z91040 Latex allergy status: Secondary | ICD-10-CM | POA: Diagnosis not present

## 2017-12-07 DIAGNOSIS — E785 Hyperlipidemia, unspecified: Secondary | ICD-10-CM | POA: Insufficient documentation

## 2017-12-07 DIAGNOSIS — Z836 Family history of other diseases of the respiratory system: Secondary | ICD-10-CM | POA: Insufficient documentation

## 2017-12-07 DIAGNOSIS — E039 Hypothyroidism, unspecified: Secondary | ICD-10-CM | POA: Diagnosis not present

## 2017-12-07 DIAGNOSIS — Z880 Allergy status to penicillin: Secondary | ICD-10-CM | POA: Insufficient documentation

## 2017-12-07 DIAGNOSIS — Z9889 Other specified postprocedural states: Secondary | ICD-10-CM | POA: Insufficient documentation

## 2017-12-07 DIAGNOSIS — Z91018 Allergy to other foods: Secondary | ICD-10-CM | POA: Diagnosis not present

## 2017-12-07 DIAGNOSIS — R0789 Other chest pain: Secondary | ICD-10-CM | POA: Diagnosis present

## 2017-12-07 DIAGNOSIS — E119 Type 2 diabetes mellitus without complications: Secondary | ICD-10-CM | POA: Insufficient documentation

## 2017-12-07 HISTORY — PX: LEFT HEART CATH AND CORONARY ANGIOGRAPHY: CATH118249

## 2017-12-07 HISTORY — DX: Unspecified hearing loss, unspecified ear: H91.90

## 2017-12-07 HISTORY — DX: Essential (primary) hypertension: I10

## 2017-12-07 HISTORY — DX: Sleep apnea, unspecified: G47.30

## 2017-12-07 HISTORY — PX: INTRAVASCULAR PRESSURE WIRE/FFR STUDY: CATH118243

## 2017-12-07 LAB — POCT ACTIVATED CLOTTING TIME: Activated Clotting Time: 252 seconds

## 2017-12-07 LAB — GLUCOSE, CAPILLARY
Glucose-Capillary: 258 mg/dL — ABNORMAL HIGH (ref 70–99)
Glucose-Capillary: 270 mg/dL — ABNORMAL HIGH (ref 70–99)
Glucose-Capillary: 293 mg/dL — ABNORMAL HIGH (ref 70–99)
Glucose-Capillary: 325 mg/dL — ABNORMAL HIGH (ref 70–99)

## 2017-12-07 SURGERY — LEFT HEART CATH AND CORONARY ANGIOGRAPHY
Anesthesia: LOCAL

## 2017-12-07 MED ORDER — LIDOCAINE HCL (PF) 1 % IJ SOLN
INTRAMUSCULAR | Status: DC | PRN
  Administered 2017-12-07: 4 mL

## 2017-12-07 MED ORDER — SODIUM CHLORIDE 0.9 % IV SOLN: INTRAVENOUS | Status: AC

## 2017-12-07 MED ORDER — IOHEXOL 350 MG/ML SOLN
INTRAVENOUS | Status: DC | PRN
  Administered 2017-12-07: 120 mL via INTRAVENOUS

## 2017-12-07 MED ORDER — HEPARIN SODIUM (PORCINE) 1000 UNIT/ML IJ SOLN
INTRAMUSCULAR | Status: AC
  Filled 2017-12-07: qty 1

## 2017-12-07 MED ORDER — MORPHINE SULFATE (PF) 2 MG/ML IV SOLN
2.0000 mg | INTRAVENOUS | Status: DC | PRN
  Administered 2017-12-07: 2 mg via INTRAVENOUS
  Filled 2017-12-07: qty 1

## 2017-12-07 MED ORDER — SODIUM CHLORIDE 0.9% FLUSH: 3.0000 mL | INTRAVENOUS | Status: DC | PRN

## 2017-12-07 MED ORDER — INSULIN ASPART 100 UNIT/ML ~~LOC~~ SOLN
0.0000 [IU] | Freq: Three times a day (TID) | SUBCUTANEOUS | Status: DC
  Administered 2017-12-08: 5 [IU] via SUBCUTANEOUS
  Administered 2017-12-08: 8 [IU] via SUBCUTANEOUS

## 2017-12-07 MED ORDER — ADENOSINE (DIAGNOSTIC) 140MCG/KG/MIN
INTRAVENOUS | Status: DC | PRN
  Administered 2017-12-07: 140 ug/kg/min via INTRAVENOUS

## 2017-12-07 MED ORDER — HEPARIN (PORCINE) IN NACL 1000-0.9 UT/500ML-% IV SOLN
INTRAVENOUS | Status: AC
  Filled 2017-12-07: qty 1000

## 2017-12-07 MED ORDER — DIPHENHYDRAMINE HCL 50 MG/ML IJ SOLN: 50.0000 mg | Freq: Once | INTRAMUSCULAR | Status: DC

## 2017-12-07 MED ORDER — MIDAZOLAM HCL 2 MG/2ML IJ SOLN
INTRAMUSCULAR | Status: AC
  Filled 2017-12-07: qty 2

## 2017-12-07 MED ORDER — DIPHENHYDRAMINE HCL 25 MG PO CAPS
25.0000 mg | ORAL_CAPSULE | Freq: Four times a day (QID) | ORAL | Status: AC
  Administered 2017-12-07 (×2): 25 mg via ORAL
  Filled 2017-12-07 (×2): qty 1

## 2017-12-07 MED ORDER — SODIUM CHLORIDE 0.9 % IV SOLN: 250.0000 mL | INTRAVENOUS | Status: DC | PRN

## 2017-12-07 MED ORDER — SODIUM CHLORIDE 0.9% FLUSH: 3.0000 mL | Freq: Two times a day (BID) | INTRAVENOUS | Status: DC

## 2017-12-07 MED ORDER — PREDNISONE 20 MG PO TABS: 50.0000 mg | ORAL_TABLET | Freq: Four times a day (QID) | ORAL | Status: DC

## 2017-12-07 MED ORDER — FENTANYL CITRATE (PF) 100 MCG/2ML IJ SOLN
INTRAMUSCULAR | Status: DC | PRN
  Administered 2017-12-07 (×2): 25 ug via INTRAVENOUS

## 2017-12-07 MED ORDER — HEPARIN (PORCINE) IN NACL 1000-0.9 UT/500ML-% IV SOLN
INTRAVENOUS | Status: DC | PRN
  Administered 2017-12-07 (×2): 500 mL

## 2017-12-07 MED ORDER — VERAPAMIL HCL 2.5 MG/ML IV SOLN
INTRAVENOUS | Status: DC | PRN
  Administered 2017-12-07: 10 mL via INTRA_ARTERIAL

## 2017-12-07 MED ORDER — INSULIN ASPART 100 UNIT/ML ~~LOC~~ SOLN: 0.0000 [IU] | Freq: Three times a day (TID) | SUBCUTANEOUS | Status: DC

## 2017-12-07 MED ORDER — LIDOCAINE HCL (PF) 1 % IJ SOLN
INTRAMUSCULAR | Status: AC
  Filled 2017-12-07: qty 30

## 2017-12-07 MED ORDER — SODIUM CHLORIDE 0.9 % WEIGHT BASED INFUSION: 1.0000 mL/kg/h | INTRAVENOUS | Status: DC

## 2017-12-07 MED ORDER — FENTANYL CITRATE (PF) 100 MCG/2ML IJ SOLN
INTRAMUSCULAR | Status: AC
  Filled 2017-12-07: qty 2

## 2017-12-07 MED ORDER — SODIUM CHLORIDE 0.9 % WEIGHT BASED INFUSION
3.0000 mL/kg/h | INTRAVENOUS | Status: DC
  Administered 2017-12-07: 3 mL/kg/h via INTRAVENOUS

## 2017-12-07 MED ORDER — INSULIN ASPART 100 UNIT/ML ~~LOC~~ SOLN
0.0000 [IU] | Freq: Every day | SUBCUTANEOUS | Status: DC
  Administered 2017-12-07: 4 [IU] via SUBCUTANEOUS

## 2017-12-07 MED ORDER — HEPARIN SODIUM (PORCINE) 1000 UNIT/ML IJ SOLN
INTRAMUSCULAR | Status: DC | PRN
  Administered 2017-12-07: 5000 [IU] via INTRAVENOUS
  Administered 2017-12-07: 2000 [IU] via INTRAVENOUS
  Administered 2017-12-07: 5000 [IU] via INTRAVENOUS

## 2017-12-07 MED ORDER — MIDAZOLAM HCL 2 MG/2ML IJ SOLN
INTRAMUSCULAR | Status: DC | PRN
  Administered 2017-12-07: 1 mg via INTRAVENOUS

## 2017-12-07 MED ORDER — ADENOSINE 12 MG/4ML IV SOLN
INTRAVENOUS | Status: AC
  Filled 2017-12-07: qty 16

## 2017-12-07 MED ORDER — ONDANSETRON HCL 4 MG/2ML IJ SOLN
4.0000 mg | Freq: Four times a day (QID) | INTRAMUSCULAR | Status: DC | PRN
Start: 2017-12-07 — End: 2017-12-08
  Administered 2017-12-07: 4 mg via INTRAVENOUS
  Filled 2017-12-07: qty 2

## 2017-12-07 MED ORDER — DIPHENHYDRAMINE HCL 25 MG PO CAPS: 50.0000 mg | ORAL_CAPSULE | Freq: Once | ORAL | Status: DC

## 2017-12-07 MED ORDER — ASPIRIN 81 MG PO CHEW: 81.0000 mg | CHEWABLE_TABLET | ORAL | Status: DC

## 2017-12-07 MED ORDER — VERAPAMIL HCL 2.5 MG/ML IV SOLN
INTRAVENOUS | Status: AC
  Filled 2017-12-07: qty 2

## 2017-12-07 MED ORDER — SODIUM CHLORIDE 0.9% FLUSH
3.0000 mL | Freq: Two times a day (BID) | INTRAVENOUS | Status: DC
  Administered 2017-12-08: 3 mL via INTRAVENOUS

## 2017-12-07 MED ORDER — PREDNISONE 20 MG PO TABS
50.0000 mg | ORAL_TABLET | Freq: Four times a day (QID) | ORAL | Status: AC
  Administered 2017-12-07 (×2): 50 mg via ORAL
  Filled 2017-12-07 (×2): qty 1

## 2017-12-07 SURGICAL SUPPLY — 12 items
CATH OPTITORQUE TIG 4.0 5F (CATHETERS) ×2 IMPLANT
CATH VISTA GUIDE 6FR JR4 (CATHETERS) ×2 IMPLANT
DEVICE RAD COMP TR BAND LRG (VASCULAR PRODUCTS) ×2 IMPLANT
GLIDESHEATH SLEND A-KIT 6F 22G (SHEATH) ×2 IMPLANT
GUIDEWIRE INQWIRE 1.5J.035X260 (WIRE) ×1 IMPLANT
GUIDEWIRE PRESSURE COMET II (WIRE) ×2 IMPLANT
INQWIRE 1.5J .035X260CM (WIRE) ×2
KIT ESSENTIALS PG (KITS) ×2 IMPLANT
KIT HEART LEFT (KITS) ×2 IMPLANT
PACK CARDIAC CATHETERIZATION (CUSTOM PROCEDURE TRAY) ×2 IMPLANT
TRANSDUCER W/STOPCOCK (MISCELLANEOUS) ×2 IMPLANT
TUBING CIL FLEX 10 FLL-RA (TUBING) ×2 IMPLANT

## 2017-12-07 NOTE — Research (Signed)
CADFEM Informed Consent   Subject Name: Autumn Bowen  Subject met inclusion and exclusion criteria.  The informed consent form, study requirements and expectations were reviewed with the subject and questions and concerns were addressed prior to the signing of the consent form.  The subject verbalized understanding of the trail requirements.  The subject agreed to participate in the CADFEM trial and signed the informed consent.  The informed consent was obtained prior to performance of any protocol-specific procedures for the subject.  A copy of the signed informed consent was given to the subject and a copy was placed in the subject's medical record.  [name] 12/07/2017, 10:58 AM

## 2017-12-07 NOTE — Interval H&P Note (Signed)
History and Physical Interval Note:  12/07/2017 12:31 PM  Autumn Bowen  has presented today for surgery, with the diagnosis of (class III) angina  The various methods of treatment have been discussed with the patient and family. After consideration of risks, benefits and other options for treatment, the patient has consented to  Procedure(s): LEFT HEART CATH AND CORONARY ANGIOGRAPHY (N/A) with possible PERCUTANEOUS CORONARY INTERVENTION as a surgical intervention .  The patient's history has been reviewed, patient examined, no change in status, stable for surgery.  I have reviewed the patient's chart and labs.  Questions were answered to the patient's satisfaction.    Cath Lab Visit (complete for each Cath Lab visit)  Clinical Evaluation Leading to the Procedure:   ACS: No.  Non-ACS:    Anginal Classification: CCS III  Anti-ischemic medical therapy: Minimal Therapy (1 class of medications)  Non-Invasive Test Results: No non-invasive testing performed  Prior CABG: No previous CABG   Glenetta Hew

## 2017-12-08 ENCOUNTER — Encounter (HOSPITAL_COMMUNITY): Payer: Self-pay | Admitting: Cardiology

## 2017-12-08 DIAGNOSIS — I201 Angina pectoris with documented spasm: Secondary | ICD-10-CM | POA: Diagnosis not present

## 2017-12-08 DIAGNOSIS — Z8249 Family history of ischemic heart disease and other diseases of the circulatory system: Secondary | ICD-10-CM | POA: Diagnosis not present

## 2017-12-08 DIAGNOSIS — Z91041 Radiographic dye allergy status: Secondary | ICD-10-CM | POA: Diagnosis not present

## 2017-12-08 DIAGNOSIS — R0789 Other chest pain: Secondary | ICD-10-CM

## 2017-12-08 DIAGNOSIS — E785 Hyperlipidemia, unspecified: Secondary | ICD-10-CM | POA: Diagnosis not present

## 2017-12-08 DIAGNOSIS — E039 Hypothyroidism, unspecified: Secondary | ICD-10-CM | POA: Diagnosis not present

## 2017-12-08 DIAGNOSIS — G4733 Obstructive sleep apnea (adult) (pediatric): Secondary | ICD-10-CM

## 2017-12-08 DIAGNOSIS — Z823 Family history of stroke: Secondary | ICD-10-CM | POA: Diagnosis not present

## 2017-12-08 DIAGNOSIS — E119 Type 2 diabetes mellitus without complications: Secondary | ICD-10-CM | POA: Diagnosis not present

## 2017-12-08 DIAGNOSIS — F17210 Nicotine dependence, cigarettes, uncomplicated: Secondary | ICD-10-CM | POA: Diagnosis not present

## 2017-12-08 DIAGNOSIS — E669 Obesity, unspecified: Secondary | ICD-10-CM | POA: Diagnosis not present

## 2017-12-08 DIAGNOSIS — I209 Angina pectoris, unspecified: Secondary | ICD-10-CM | POA: Diagnosis not present

## 2017-12-08 LAB — GLUCOSE, CAPILLARY
Glucose-Capillary: 255 mg/dL — ABNORMAL HIGH (ref 70–99)
Glucose-Capillary: 226 mg/dL — ABNORMAL HIGH (ref 70–99)

## 2017-12-08 MED ORDER — ISOSORBIDE MONONITRATE ER 30 MG PO TB24: 30.0000 mg | ORAL_TABLET | Freq: Every day | ORAL | 3 refills | Status: DC

## 2017-12-08 MED ORDER — ISOSORBIDE MONONITRATE ER 30 MG PO TB24
30.0000 mg | ORAL_TABLET | Freq: Every day | ORAL | Status: DC
  Administered 2017-12-08: 30 mg via ORAL
  Filled 2017-12-08: qty 1

## 2017-12-08 NOTE — Progress Notes (Signed)
Progress Note  Patient Name: Autumn Bowen Date of Encounter: 12/08/2017  Primary Cardiologist: Jenean Lindau, MD   Subjective   Feeling well.  No chest pain or shortness of breath.  Reports that NTG relieves her chest pain.   Inpatient Medications    Scheduled Meds: . insulin aspart  0-15 Units Subcutaneous TID WC  . insulin aspart  0-5 Units Subcutaneous QHS  . sodium chloride flush  3 mL Intravenous Q12H   Continuous Infusions: . sodium chloride     PRN Meds: sodium chloride, morphine injection, ondansetron (ZOFRAN) IV, sodium chloride flush   Vital Signs    Vitals:   12/07/17 2000 12/07/17 2107 12/08/17 0546 12/08/17 0547  BP: 123/75     Pulse:      Resp:      Temp:  98 F (36.7 C) 97.9 F (36.6 C)   TempSrc:  Oral Oral   SpO2: 93%     Weight:    216 lb (98 kg)  Height:        Intake/Output Summary (Last 24 hours) at 12/08/2017 1133 Last data filed at 12/08/2017 0809 Gross per 24 hour  Intake 240 ml  Output 1350 ml  Net -1110 ml   Filed Weights   12/07/17 1035 12/08/17 0547  Weight: 212 lb (96.2 kg) 216 lb (98 kg)    Telemetry    Sinus rhythm.  PVCs - Personally Reviewed  ECG    N/a - Personally Reviewed  Physical Exam   VS:  BP 123/75   Pulse 95   Temp 97.9 F (36.6 C) (Oral)   Resp 20   Ht 5' 3"  (1.6 m)   Wt 216 lb (98 kg)   LMP 09/21/1974   SpO2 93%   BMI 38.26 kg/m  , BMI Body mass index is 38.26 kg/m. GENERAL:  Well appearing HEENT: Pupils equal round and reactive, fundi not visualized, oral mucosa unremarkable NECK:  No jugular venous distention, waveform within normal limits, carotid upstroke brisk and symmetric, no bruits LUNGS:  Clear to auscultation bilaterally HEART:  RRR.  PMI not displaced or sustained,S1 and S2 within normal limits, no S3, no S4, no clicks, no rubs, no murmurs ABD:  Flat, positive bowel sounds normal in frequency in pitch, no bruits, no rebound, no guarding, no midline pulsatile mass, no  hepatomegaly, no splenomegaly EXT:  2 plus pulses throughout, no edema, no cyanosis no clubbing SKIN:  No rashes no nodules NEURO:  Cranial nerves II through XII grossly intact, motor grossly intact throughout PSYCH:  Cognitively intact, oriented to person place and time   Labs    ChemistryNo results for input(s): NA, K, CL, CO2, GLUCOSE, BUN, CREATININE, CALCIUM, PROT, ALBUMIN, AST, ALT, ALKPHOS, BILITOT, GFRNONAA, GFRAA, ANIONGAP in the last 168 hours.   HematologyNo results for input(s): WBC, RBC, HGB, HCT, MCV, MCH, MCHC, RDW, PLT in the last 168 hours.  Cardiac EnzymesNo results for input(s): TROPONINI in the last 168 hours. No results for input(s): TROPIPOC in the last 168 hours.   BNPNo results for input(s): BNP, PROBNP in the last 168 hours.   DDimer No results for input(s): DDIMER in the last 168 hours.   Radiology    No results found.  Cardiac Studies   LHC 12/07/17:  Mid RCA lesion is 60% stenosed. FFR 0.94  There is hyperdynamic left ventricular systolic function. LVEF > 65% with normal LVEDP/   Angiographically moderate mid RCA lesion that was minimally reactive and FFR.  Not physiologically  significant.  Otherwise angiographically normal coronary arteries. Consider evaluation for noncardiac chest pain versus coronary spasm.  Patient Profile     72 y.o. female with morbid obesity, OSA, diabetes, and COPD here for elective cardiac catheterization in the setting of chest pain concerning for angina.  Assessment & Plan    # Chest pain: # Non-obstructive CAD: Cardiac catheterization revealed 60% mid RCA disease.  FFR was negative.  Ch she reports of dramatic relief with nitroglycerin.est pain is either non-cardiac or related to spasm.  We will give a trial of Imdur 30 mg daily.  Could also consider a CCB for spasm.  Continue aspirin, Zetia, and simvastatin.  LDL 73.  She was admitted for observation because of her history of contrast dye allergy and cardiac arrest.   She has been stable and has not had any allergic symptoms.  We will arrange follow up with Dr. [name].  For questions or updates, please contact [place] Please consult [email or web address] for contact info under Cardiology/STEMI.      Signed, Skeet Latch, MD  12/08/2017, 11:33 AM

## 2017-12-08 NOTE — Discharge Instructions (Signed)
PLEASE REMEMBER TO BRING ALL OF YOUR MEDICATIONS TO EACH OF YOUR FOLLOW-UP OFFICE VISITS.  PLEASE ATTEND ALL SCHEDULED FOLLOW-UP APPOINTMENTS.   Activity: Increase activity slowly as tolerated. You may shower, but no soaking baths (or swimming) for 1 week. No driving for 24 hours. No lifting over 5 lbs for 1 week. No sexual activity for 1 week.   You May Return to Work: in 1 week (if applicable)  Wound Care: You may wash cath site gently with soap and water. Keep cath site clean and dry. If you notice pain, swelling, bleeding or pus at your cath site, please call [phone].  Please do not take your metformin until 12/10/17.

## 2017-12-08 NOTE — Progress Notes (Signed)
Inpatient Diabetes Program Recommendations  AACE/ADA: New Consensus Statement on Inpatient Glycemic Control (2015)  Target Ranges:  Prepandial:   less than 140 mg/dL      Peak postprandial:   less than 180 mg/dL (1-2 hours)      Critically ill patients:  140 - 180 mg/dL   Lab Results  Component Value Date   GLUCAP 255 (H) 12/08/2017   HGBA1C 8.1 (A) 10/17/2017    Review of Glycemic Control Results for DEANDRE, STANSEL" (MRN [id number]) as of 12/08/2017 11:31  Ref. Range 12/07/2017 13:51 12/07/2017 17:23 12/07/2017 21:11 12/08/2017 07:36  Glucose-Capillary Latest Ref Range: 70 - 99 mg/dL 270 (H) 293 (H) 325 (H) 255 (H)   Diabetes history: Type 2 DM Outpatient Diabetes medications: Tresiba 18 units QHS, Metformin 750 mg QAM Current orders for Inpatient glycemic control: Novolog 0-15 units TID, Novolog 0-5 units QHS  Inpatient Diabetes Program Recommendations:    Noted that patient had received 2 doses of Prednisone 50 mg. Thus, explaining a portion of the increased BS.   However, patient has not received basal insulin during admission. FSBS was 255 mg/dL, consider adding back Lantus 12 units QHS.   Thanks, Bronson Curb, MSN, RNC-OB Diabetes Coordinator [phone] (8a-5p)

## 2017-12-08 NOTE — Discharge Summary (Signed)
Discharge Summary    Patient ID: Autumn Bowen,  MRN: [id number], DOB/AGE: [birth year] 73 y.o.  Admit date: 12/07/2017 Discharge date: 12/08/2017  Primary Care Provider: Emeterio Reeve Primary Cardiologist: Jenean Lindau, MD  Discharge Diagnoses    Principal Problem:   Angina, class III [facility]) Active Problems:   OSA (obstructive sleep apnea)   Radiographic dye allergy status   Chest pain of unknown etiology   Angina pectoris (HCC)   Allergies Allergies  Allergen Reactions  . Bee Venom Anaphylaxis  . Iodinated Diagnostic Agents Other (See Comments)    Per patient cardiac arrest IVP DYE-CARDIAC ARREST  . Penicillins Anaphylaxis    Has patient had a PCN reaction causing immediate rash, facial/tongue/throat swelling, SOB or lightheadedness with hypotension: yes Has patient had a PCN reaction causing severe rash involving mucus membranes or skin necrosis: no Has patient had a PCN reaction that required hospitalization: yes Has patient had a PCN reaction occurring within the last 10 years: no If all of the above answers are "NO", then may proceed with Cephalosporin use.   . Povidone Iodine Itching and Rash  . Tomato Itching  . Chocolate Rash  . Latex Rash  . Other Rash  . Atorvastatin Calcium Nausea Only  . Esomeprazole Magnesium Nausea Only  . Gluten Meal   . Metformin And Related Diarrhea  . Quetiapine Fumarate     Per pt she can not take XR  . Trazodone And Nefazodone Hives  . Zolpidem Tartrate Nausea And Vomiting    Diagnostic Studies/Procedures    Left heart catheterization 12/07/17:  Mid RCA lesion is 60% stenosed. FFR 0.94  There is hyperdynamic left ventricular systolic function. LVEF > 65% with normal LVEDP/   Angiographically moderate mid RCA lesion that was minimally reactive and FFR.  Not physiologically significant.  Otherwise angiographically normal coronary arteries. Consider evaluation for noncardiac chest pain versus coronary  spasm.  Plan: The patient will be discharged home after bedrest from the [place].  Follow-up with Dr. [name]  Recommend Aspirin 38m daily for moderate CAD. _____________   History of Present Illness     Patient is a pleasant 72year old female.  She has past medical history of diabetes mellitus and dyslipidemia.  She mentions to me that she has been a smoker since a young age.  She leads a sedentary lifestyle because of orthopedic issues.  She has sleep apnea and COPD.  She is significantly overweight.  The patient reported that when she tries to exert herself she has chest tightness going to the neck into the arm..  This is of concern to her.  This has been happening recurrently and therefore she was referred to outpatient cardiology for evalution. Decision was made to pursue left heart catheterization to further evaluate her symptoms.   Hospital Course     Consultants: None   1. Chest pain in patient with non-obstructive CAD: patient presented for outpatient LHC to further evaluate exertional chest pain. She underwent cardiac catheterization 12/07/17 which revealed 60% stenosis of RCA with negative FFR. Her pain improved dramatically with nitroglycerin, therefore it was suspected that pain may be related to spasm vs non-cardiac cause. She was started on imdur for possible spasm and recommended to continue ASA, zetia, and simvastatin. - Continue imdur; could consider CCB for spasm if intolerant or having breakthrough CP  2. HLD: LDL 73 11/27/17 - Continue zetia and simvastatin  3. DM type 2: A1C 8.1 09/2017; goal <7 - Continue  outpatient follow-up for continued management of diabetes - Patient instructed to not take her metformin 12/09/17 given recent contrast. She can resume this medication 12/10/17.  _____________  Discharge Vitals Blood pressure 123/75, pulse 95, temperature 97.9 F (36.6 C), temperature source Oral, resp. rate 20, height 5' 3"  (1.6 m),  weight 216 lb (98 kg), last menstrual period 09/21/1974, SpO2 93 %.  Filed Weights   12/07/17 1035 12/08/17 0547  Weight: 212 lb (96.2 kg) 216 lb (98 kg)    Labs & Radiologic Studies    CBC No results for input(s): WBC, NEUTROABS, HGB, HCT, MCV, PLT in the last 72 hours. Basic Metabolic Panel No results for input(s): NA, K, CL, CO2, GLUCOSE, BUN, CREATININE, CALCIUM, MG, PHOS in the last 72 hours. Liver Function Tests No results for input(s): AST, ALT, ALKPHOS, BILITOT, PROT, ALBUMIN in the last 72 hours. No results for input(s): LIPASE, AMYLASE in the last 72 hours. Cardiac Enzymes No results for input(s): CKTOTAL, CKMB, CKMBINDEX, TROPONINI in the last 72 hours. BNP Invalid input(s): POCBNP D-Dimer No results for input(s): DDIMER in the last 72 hours. Hemoglobin A1C No results for input(s): HGBA1C in the last 72 hours. Fasting Lipid Panel No results for input(s): CHOL, HDL, LDLCALC, TRIG, CHOLHDL, LDLDIRECT in the last 72 hours. Thyroid Function Tests No results for input(s): TSH, T4TOTAL, T3FREE, THYROIDAB in the last 72 hours.  Invalid input(s): FREET3 _____________  Dg Chest 2 View  Result Date: 11/28/2017 CLINICAL DATA:  Several month history of right-sided chest pain with pleuritic component. EXAM: CHEST - 2 VIEW COMPARISON:  Portable chest x-ray dated May 17, 2009 FINDINGS: The lungs are well-expanded. The interstitial markings are coarse. There is no alveolar infiltrate or pleural effusion. The heart and pulmonary vascularity are normal. The trachea is midline. There is calcification in the wall of the aortic arch. There is mild multilevel degenerative disc disease of the thoracic spine. IMPRESSION: Chronic bronchitic-smoking related changes. No acute pneumonia nor CHF. Thoracic aortic atherosclerosis. Electronically Signed   By: David  Martinique M.D.   On: 11/28/2017 07:54   Dg Cervical Spine Complete  Result Date: 11/16/2017 CLINICAL DATA:  Neck pain, no known injury,  initial encounter EXAM: CERVICAL SPINE - COMPLETE 4+ VIEW COMPARISON:  None. FINDINGS: Seven cervical segments are well visualized. Vertebral body height is well maintained. Mild osteophytic changes are noted most prominent at C6-7. No significant neural foraminal narrowing is seen. Multilevel facet hypertrophic changes are noted. The odontoid is within normal limits. IMPRESSION: Multilevel degenerative change without acute abnormality Electronically Signed   By: Inez Catalina M.D.   On: 11/16/2017 14:20   Dg Lumbar Spine Complete  Result Date: 11/16/2017 CLINICAL DATA:  Low back pain, no known injury, initial encounter EXAM: LUMBAR SPINE - COMPLETE 4+ VIEW COMPARISON:  None. FINDINGS: Five lumbar type vertebral bodies are well visualized. The fifth lumbar vertebra is partially sacralized. Facet hypertrophic changes are noted. Degenerative anterolisthesis of L4 on L5 is noted. Mild disc space narrowing is noted at L5-S1. Diffuse aortic calcifications are seen without aneurysmal dilatation. IMPRESSION: Degenerative change with anterolisthesis of L4 on L5. Electronically Signed   By: Inez Catalina M.D.   On: 11/16/2017 14:13   Dg Knee 1-2 Views Right  Result Date: 11/16/2017 CLINICAL DATA:  Knee pain, no known injury, initial encounter EXAM: RIGHT KNEE - 2 VIEW COMPARISON:  None. FINDINGS: Tricompartmental degenerative changes are noted most marked in the medial joint space. No joint effusion is seen. No soft tissue abnormality is noted.  IMPRESSION: Degenerative change without acute abnormality. Electronically Signed   By: Inez Catalina M.D.   On: 11/16/2017 14:12   Dg Knee Complete 4 Views Left  Result Date: 11/16/2017 CLINICAL DATA:  Left knee pain, no known injury, initial encounter EXAM: LEFT KNEE - COMPLETE 4+ VIEW COMPARISON:  None. FINDINGS: Mild medial joint space narrowing is noted. Mild patellofemoral degenerative changes are noted as well. No joint effusion is seen. No acute fracture is noted.  IMPRESSION: Degenerative change without acute abnormality. Electronically Signed   By: Inez Catalina M.D.   On: 11/16/2017 14:10   Disposition   Patient was seen and examined by Dr. Oval Linsey who deemed patient as stable for discharge. Follow-up has been arranged. Discharge medications as listed below.   Follow-up Plans & Appointments    Follow-up Information    Revankar, Reita Cliche, MD Follow up on 12/21/2017.   Specialty:  Cardiology Why:  Please arrive 15 minutes early for your 1:00pm appointment Contact information: [address] [place] [address] [phone]          Discharge Instructions    Diet - low sodium heart healthy   Complete by:  As directed    Increase activity slowly   Complete by:  As directed       Discharge Medications   Allergies as of 12/08/2017      Reactions   Bee Venom Anaphylaxis   Iodinated Diagnostic Agents Other (See Comments)   Per patient cardiac arrest IVP [facility] ARREST   Penicillins Anaphylaxis   Has patient had a PCN reaction causing immediate rash, facial/tongue/throat swelling, SOB or lightheadedness with hypotension: yes Has patient had a PCN reaction causing severe rash involving mucus membranes or skin necrosis: no Has patient had a PCN reaction that required hospitalization: yes Has patient had a PCN reaction occurring within the last 10 years: no If all of the above answers are "NO", then may proceed with Cephalosporin use.   Povidone Iodine Itching, Rash   Tomato Itching   Chocolate Rash   Latex Rash   Other Rash   Atorvastatin Calcium Nausea Only   Esomeprazole Magnesium Nausea Only   Gluten Meal    Metformin And Related Diarrhea   Quetiapine Fumarate    Per pt she can not take XR   Trazodone And Nefazodone Hives   Zolpidem Tartrate Nausea And Vomiting      Medication List    TAKE these medications   ACCU-CHEK AVIVA PLUS test strip Generic drug:  glucose blood test 1-2 TIMES daily    acetaminophen 500 MG tablet Commonly known as:  TYLENOL Take 1,000 mg by mouth every 6 (six) hours as needed for mild pain or moderate pain.   albuterol 108 (90 Base) MCG/ACT inhaler Commonly known as:  PROVENTIL HFA;VENTOLIN HFA Inhale 2 puffs into the lungs every 6 (six) hours as needed for wheezing or shortness of breath.   AMBULATORY NON FORMULARY MEDICATION Glucometer, test strips and lancets for testing once a day for DM, uncontrolled.   amitriptyline 50 MG tablet Commonly known as:  ELAVIL TAKE 1 TABLET BY MOUTH EVERY NIGHT AT BEDTIME   aspirin EC 81 MG tablet Take 1 tablet (81 mg total) by mouth daily.   cetirizine 10 MG tablet Commonly known as:  ZYRTEC Take 10 mg by mouth at bedtime.   CHANTIX CONTINUING MONTH PAK 1 MG tablet Generic drug:  varenicline TAKE 1 TABLET BY MOUTH TWICE DAILY( EVERY TWELVE HOURS)   cholecalciferol 1000 units tablet  Commonly known as:  VITAMIN D Take 1,000 Units by mouth daily.   dicyclomine 10 MG capsule Commonly known as:  BENTYL Take 1 capsule (10 mg total) 2 (two) times daily by mouth.   EPIPEN 2-PAK 0.3 mg/0.3 mL Soaj injection Generic drug:  EPINEPHrine USE AS DIRECTED   ezetimibe 10 MG tablet Commonly known as:  ZETIA Take 1 tablet (10 mg total) by mouth daily.   famotidine 20 MG tablet Commonly known as:  PEPCID Take 20 mg by mouth daily.   Fluticasone-Salmeterol 250-50 MCG/DOSE Aepb Commonly known as:  ADVAIR Inhale 1 puff into the lungs every 12 (twelve) hours.   GLUCOSAMINE-CHONDROITIN PO Take 1 tablet by mouth 2 (two) times daily.   Insulin Degludec 200 UNIT/ML Sopn Commonly known as:  TRESIBA FLEXTOUCH Inject 16 Units into the skin at bedtime. Increase by 2 units every 5 days until fasting glucose 90-120. What changed:    how much to take  additional instructions   isosorbide mononitrate 30 MG 24 hr tablet Commonly known as:  IMDUR Take 1 tablet (30 mg total) by mouth daily. Start taking on:   12/09/2017   levocetirizine 5 MG tablet Commonly known as:  XYZAL Take 5 mg by mouth every evening.   levothyroxine 175 MCG tablet Commonly known as:  SYNTHROID, LEVOTHROID Take 175 mcg by mouth daily before breakfast.   LUMIGAN 0.01 % Soln Generic drug:  bimatoprost Instill 1 drop into both eyes every night   LYRICA 50 MG capsule Generic drug:  pregabalin TAKE ONE CAPSULE TWICE DAILY BY MOUTH   metFORMIN 750 MG 24 hr tablet Commonly known as:  GLUCOPHAGE-XR Take 1 tablet (750 mg total) by mouth daily with breakfast. Notes to patient:  Please do not take this medication tomorrow 12/09/17. You can restart this medication 12/10/17   nitroGLYCERIN 0.4 MG SL tablet Commonly known as:  NITROSTAT Place 1 tablet (0.4 mg total) under the tongue every 5 (five) minutes as needed for chest pain.   ondansetron 8 MG tablet Commonly known as:  ZOFRAN TAKE 1 TABLET BY MOUTH EVERY 8 HOURS AS NEEDED FOR NAUSEA AND VOMITING   PRESERVISION AREDS 2 PO Take 1 capsule by mouth 2 (two) times daily.   promethazine 25 MG tablet Commonly known as:  PHENERGAN Take 1 tablet (25 mg total) by mouth every 6 (six) hours as needed for nausea or vomiting.   QUEtiapine 200 MG tablet Commonly known as:  SEROQUEL Take 1 tablet (200 mg total) by mouth at bedtime.   sertraline 100 MG tablet Commonly known as:  ZOLOFT Take 2 tablets (200 mg total) by mouth daily.   simvastatin 40 MG tablet Commonly known as:  ZOCOR Take 40 mg by mouth daily.   SURE COMFORT PEN NEEDLES 32G X 4 MM Misc Generic drug:  Insulin Pen Needle USE AS DIRECTED   triamcinolone cream 0.1 % Commonly known as:  KENALOG Apply 1 application topically 2 (two) times daily. For itchy bumps.        Outstanding Labs/Studies   None  Duration of Discharge Encounter   Greater than 30 minutes including physician time.  Signed, Abigail Butts PA-C 12/08/2017, 1:10 PM

## 2017-12-08 NOTE — Progress Notes (Signed)
The patient has been given discharge instructions along with a new medication list and what to take today. She has been educated on cath site care and has follow up appointment and a prescription to pick up. She is discharging with her son via car.   Saddie Benders RN

## 2017-12-11 ENCOUNTER — Ambulatory Visit: Payer: Medicare HMO | Admitting: Physical Therapy

## 2017-12-11 ENCOUNTER — Telehealth: Payer: Self-pay

## 2017-12-11 NOTE — Telephone Encounter (Signed)
Spoke with patient regarding TOC. Patient states that she is tolerating her imdur well. She did not have any further questions or concerns. The appointment was changed from [place] to [facility] as this was closer for the patient. The patient also stated that she has since stopped smoking.

## 2017-12-11 NOTE — Telephone Encounter (Signed)
-----   Message from Frederic Jericho sent at 12/08/2017 12:22 PM EDT ----- Regarding: TOC CALL Please maek a TOC Call for patient she is scheduled for RRR post nomral cath..needs call next 7 days please. DC 12/08/2017

## 2017-12-13 ENCOUNTER — Encounter: Payer: Self-pay | Admitting: Sports Medicine

## 2017-12-13 ENCOUNTER — Ambulatory Visit (INDEPENDENT_AMBULATORY_CARE_PROVIDER_SITE_OTHER): Payer: Medicare HMO | Admitting: Sports Medicine

## 2017-12-13 DIAGNOSIS — G8929 Other chronic pain: Secondary | ICD-10-CM | POA: Diagnosis not present

## 2017-12-13 DIAGNOSIS — M47812 Spondylosis without myelopathy or radiculopathy, cervical region: Secondary | ICD-10-CM

## 2017-12-13 DIAGNOSIS — M5442 Lumbago with sciatica, left side: Secondary | ICD-10-CM

## 2017-12-13 DIAGNOSIS — G894 Chronic pain syndrome: Secondary | ICD-10-CM

## 2017-12-13 DIAGNOSIS — M11262 Other chondrocalcinosis, left knee: Secondary | ICD-10-CM

## 2017-12-13 MED ORDER — COLCHICINE 0.6 MG PO TABS: 0.6000 mg | ORAL_TABLET | Freq: Every day | ORAL | 2 refills | Status: DC

## 2017-12-13 NOTE — Assessment & Plan Note (Signed)
Post L4-L5 laminectomy with decompression. Recurrence of left-sided lumbar radiculitis, this time failed greater than 6 weeks of physician directed conservative measures. Adding an MRI for interventional planning, no contrast for this 1.

## 2017-12-13 NOTE — Assessment & Plan Note (Signed)
If we go through the interventional pathway and she does not have adequate relief we will consider go either going up on Lyrica or switching sertraline to an SNRI or both.

## 2017-12-13 NOTE — Assessment & Plan Note (Signed)
At this point has failed greater than 6 weeks of physician directed conservative measures, adding an MRI for epidural planning.

## 2017-12-13 NOTE — Assessment & Plan Note (Signed)
Osteoarthritis and calcium pyrophosphate deposition disease. Adding colchicine 0.6 mg daily.

## 2017-12-13 NOTE — Progress Notes (Signed)
Subjective:    CC: Follow-up  HPI: Autumn Bowen returns, we found calcium pyrophosphate crystals in her knee arthrocentesis, still has pain.  Continues to have neck and back pain, agreeable to proceed with MRI.  I reviewed the past medical history, family history, social history, surgical history, and allergies today and no changes were needed.  Please see the problem list section below in epic for further details.  Past Medical History: Past Medical History:  Diagnosis Date  . Allergy    Notes that grass and some trees causes eyes to sting, burn, and water.  . Anemia   . Celiac disease   . Celiac disease   . COPD (chronic obstructive pulmonary disease) ([place])   . Diabetes mellitus type II   . Diverticulitis   . Dyslipidemia   . HOH (hard of hearing)   . Hypertension   . Hypothyroidism   . Sleep apnea   . Sore throat    Past Surgical History: Past Surgical History:  Procedure Laterality Date  . APPENDECTOMY    . BREAST SURGERY     h/o benign cystleft breast and lymp node removal on right breast area.  Marland Kitchen EYE SURGERY     Left eye cataract removal, right eye h/o macular degeneration.  . INTRAVASCULAR PRESSURE WIRE/FFR STUDY N/A 12/07/2017   Procedure: INTRAVASCULAR PRESSURE WIRE/FFR STUDY;  Surgeon: Leonie Man, MD;  Location: [facility];  Service: Cardiovascular;  Laterality: N/A;  . LEFT HEART CATH AND CORONARY ANGIOGRAPHY N/A 12/07/2017   Procedure: LEFT HEART CATH AND CORONARY ANGIOGRAPHY;  Surgeon: Leonie Man, MD;  Location: [facility];  Service: Cardiovascular;  Laterality: N/A;  . SPINE SURGERY     Pt not sure of the type of surgery she had but knows it was L4-5.  Marland Kitchen VAGINAL HYSTERECTOMY     Social History: Social History   Socioeconomic History  . Marital status: Divorced    Spouse name: Not on file  . Number of children: Not on file  . Years of education: Not on file  . Highest education level: Not on file  Occupational History  . Not on  file  Social Needs  . Financial resource strain: Not on file  . Food insecurity:    Worry: Not on file    Inability: Not on file  . Transportation needs:    Medical: Not on file    Non-medical: Not on file  Tobacco Use  . Smoking status: Current Every Day Smoker    Packs/day: 0.25    Years: 51.00    Pack years: 12.75    Types: Cigarettes    Start date: 01/02/2012  . Smokeless tobacco: Never Used  . Tobacco comment: QUITTING BACK  Substance and Sexual Activity  . Alcohol use: No  . Drug use: No  . Sexual activity: Not Currently  Lifestyle  . Physical activity:    Days per week: Not on file    Minutes per session: Not on file  . Stress: Not on file  Relationships  . Social connections:    Talks on phone: Not on file    Gets together: Not on file    Attends religious service: Not on file    Active member of club or organization: Not on file    Attends meetings of clubs or organizations: Not on file    Relationship status: Not on file  Other Topics Concern  . Not on file  Social History Narrative  . Not on file  Family History: Family History  Problem Relation Age of Onset  . Anxiety disorder Mother   . Depression Mother   . Heart attack Mother   . Stroke Mother   . Diabetes type II Mother   . Diabetes Father   . Glaucoma Father   . Hypertension Father   . COPD Father   . COPD Sister   . Hashimoto's thyroiditis Sister   . Cancer Sister   . Kidney disease Sister   . Drug abuse Brother   . Alcohol abuse Brother   . Asthma Son   . Diabetes Son    Allergies: Allergies  Allergen Reactions  . Bee Venom Anaphylaxis  . Iodinated Diagnostic Agents Other (See Comments)    Per patient cardiac arrest IVP DYE-CARDIAC ARREST  . Penicillins Anaphylaxis    Has patient had a PCN reaction causing immediate rash, facial/tongue/throat swelling, SOB or lightheadedness with hypotension: yes Has patient had a PCN reaction causing severe rash involving mucus membranes or skin  necrosis: no Has patient had a PCN reaction that required hospitalization: yes Has patient had a PCN reaction occurring within the last 10 years: no If all of the above answers are "NO", then may proceed with Cephalosporin use.   . Povidone Iodine Itching and Rash  . Tomato Itching  . Chocolate Rash  . Latex Rash  . Other Rash  . Atorvastatin Calcium Nausea Only  . Esomeprazole Magnesium Nausea Only  . Gluten Meal   . Metformin And Related Diarrhea  . Quetiapine Fumarate     Per pt she can not take XR  . Trazodone And Nefazodone Hives  . Zolpidem Tartrate Nausea And Vomiting   Medications: See med rec.  Review of Systems: No fevers, chills, night sweats, weight loss, chest pain, or shortness of breath.   Objective:    General: Well Developed, well nourished, and in no acute distress.  Neuro: Alert and oriented x3, extra-ocular muscles intact, sensation grossly intact.  HEENT: Normocephalic, atraumatic, pupils equal round reactive to light, neck supple, no masses, no lymphadenopathy, thyroid nonpalpable.  Skin: Warm and dry, no rashes. Cardiac: Regular rate and rhythm, no murmurs rubs or gallops, no lower extremity edema.  Respiratory: Clear to auscultation bilaterally. Not using accessory muscles, speaking in full sentences.  Impression and Recommendations:    Pseudogout of left knee Osteoarthritis and calcium pyrophosphate deposition disease. Adding colchicine 0.6 mg daily.  Left-sided low back pain with left-sided sciatica Post L4-L5 laminectomy with decompression. Recurrence of left-sided lumbar radiculitis, this time failed greater than 6 weeks of physician directed conservative measures. Adding an MRI for interventional planning, no contrast for this 1.  Cervical spondylosis At this point has failed greater than 6 weeks of physician directed conservative measures, adding an MRI for epidural planning.  Chronic pain syndrome If we go through the interventional  pathway and she does not have adequate relief we will consider go either going up on Lyrica or switching sertraline to an SNRI or both.  I spent 25 minutes with this patient, greater than 50% was face-to-face time counseling regarding the above diagnoses ___________________________________________ Gwen Her. Dianah Field, M.D., ABFM., CAQSM. Primary Care and [place] Instructor of [place] [employer] [facility] [employer]

## 2017-12-15 ENCOUNTER — Telehealth: Payer: Self-pay

## 2017-12-15 ENCOUNTER — Ambulatory Visit: Payer: Medicare HMO | Admitting: Physical Therapy

## 2017-12-15 NOTE — Telephone Encounter (Signed)
Take 2 tab and then one out later take 1 tab and then start back to once a day. It may cause some loose stools but will usually improved the pain or we can call in short course of prednisone.

## 2017-12-15 NOTE — Telephone Encounter (Signed)
[name] called and states she has a flare up on gout in her left knee. The colchicine is not lasting long enough. She needs something for the gout pain during the day time. She takes the colchicine at bedtime so she can sleep.

## 2017-12-18 ENCOUNTER — Ambulatory Visit: Payer: Medicare HMO | Admitting: Physical Therapy

## 2017-12-18 NOTE — Telephone Encounter (Signed)
How long can patient take the colchicine twice daily?

## 2017-12-18 NOTE — Telephone Encounter (Signed)
Patient advised to take the colchicine twice daily for 2 weeks.

## 2017-12-18 NOTE — Telephone Encounter (Signed)
Ok to increase to BID since she wants to do that.

## 2017-12-19 ENCOUNTER — Ambulatory Visit: Payer: Self-pay | Admitting: Family Medicine

## 2017-12-21 ENCOUNTER — Ambulatory Visit: Payer: Self-pay | Admitting: Cardiology

## 2017-12-21 ENCOUNTER — Telehealth: Payer: Self-pay | Admitting: Sports Medicine

## 2017-12-21 MED ORDER — DIAZEPAM 5 MG PO TABS: ORAL_TABLET | ORAL | 0 refills | Status: DC

## 2017-12-21 NOTE — Telephone Encounter (Signed)
Pt requesting premeds prior to double study MRI. Routing.

## 2017-12-21 NOTE — Telephone Encounter (Signed)
Valium preprocedural anxiolysis called in.

## 2017-12-22 ENCOUNTER — Ambulatory Visit: Payer: Medicare HMO | Admitting: Physical Therapy

## 2017-12-22 NOTE — Telephone Encounter (Signed)
Left VM with status update.  

## 2017-12-25 ENCOUNTER — Ambulatory Visit (INDEPENDENT_AMBULATORY_CARE_PROVIDER_SITE_OTHER): Payer: Medicare HMO

## 2017-12-25 ENCOUNTER — Ambulatory Visit: Payer: Medicare HMO | Admitting: Physical Therapy

## 2017-12-25 DIAGNOSIS — M48061 Spinal stenosis, lumbar region without neurogenic claudication: Secondary | ICD-10-CM

## 2017-12-25 DIAGNOSIS — M5442 Lumbago with sciatica, left side: Principal | ICD-10-CM

## 2017-12-25 DIAGNOSIS — M545 Low back pain: Secondary | ICD-10-CM | POA: Diagnosis not present

## 2017-12-25 DIAGNOSIS — M50123 Cervical disc disorder at C6-C7 level with radiculopathy: Secondary | ICD-10-CM | POA: Diagnosis not present

## 2017-12-25 DIAGNOSIS — M542 Cervicalgia: Secondary | ICD-10-CM | POA: Diagnosis not present

## 2017-12-25 DIAGNOSIS — G8929 Other chronic pain: Secondary | ICD-10-CM

## 2017-12-25 DIAGNOSIS — M47812 Spondylosis without myelopathy or radiculopathy, cervical region: Secondary | ICD-10-CM

## 2017-12-29 ENCOUNTER — Ambulatory Visit: Payer: Medicare HMO | Admitting: Physical Therapy

## 2017-12-30 ENCOUNTER — Other Ambulatory Visit: Payer: Self-pay | Admitting: Osteopathic Medicine

## 2018-01-01 ENCOUNTER — Ambulatory Visit: Payer: Medicare HMO | Admitting: Physical Therapy

## 2018-01-01 ENCOUNTER — Ambulatory Visit (INDEPENDENT_AMBULATORY_CARE_PROVIDER_SITE_OTHER): Payer: Medicare HMO | Admitting: Cardiology

## 2018-01-01 ENCOUNTER — Encounter: Payer: Self-pay | Admitting: Cardiology

## 2018-01-01 VITALS — BP 120/64 | HR 87 | Ht 63.0 in | Wt 211.8 lb

## 2018-01-01 DIAGNOSIS — J449 Chronic obstructive pulmonary disease, unspecified: Secondary | ICD-10-CM

## 2018-01-01 DIAGNOSIS — F17200 Nicotine dependence, unspecified, uncomplicated: Secondary | ICD-10-CM | POA: Diagnosis not present

## 2018-01-01 DIAGNOSIS — I2510 Atherosclerotic heart disease of native coronary artery without angina pectoris: Secondary | ICD-10-CM

## 2018-01-01 DIAGNOSIS — F17210 Nicotine dependence, cigarettes, uncomplicated: Secondary | ICD-10-CM | POA: Diagnosis not present

## 2018-01-01 DIAGNOSIS — E088 Diabetes mellitus due to underlying condition with unspecified complications: Secondary | ICD-10-CM | POA: Diagnosis not present

## 2018-01-01 MED ORDER — NITROGLYCERIN 0.4 MG SL SUBL: 0.4000 mg | SUBLINGUAL_TABLET | SUBLINGUAL | 11 refills | Status: DC | PRN

## 2018-01-01 NOTE — Patient Instructions (Signed)
Medication Instructions:  Your physician has recommended you make the following change in your medication:  START nitroglycerin as needed: When having chest pain, stop what you are doing and sit down. Take 1 nitro, wait 5 minutes. Still having chest pain, take 1 nitro, wait 5 minutes. Still having chest pain, take 1 nitro, dial 911. Total of 3 nitro in 15 minutes.   Labwork: None  Testing/Procedures: None  Follow-Up: Your physician wants you to follow-up in: 6 months. You will receive a reminder letter in the mail two months in advance. If you don't receive a letter, please call our office to schedule the follow-up appointment.   If you need a refill on your cardiac medications before your next appointment, please call your pharmacy.   Thank you for choosing [facility]! Robyne Peers, RN [phone]   Nitroglycerin sublingual tablets What is this medicine? NITROGLYCERIN (nye troe GLI ser in) is a type of vasodilator. It relaxes blood vessels, increasing the blood and oxygen supply to your heart. This medicine is used to relieve chest pain caused by angina. It is also used to prevent chest pain before activities like climbing stairs, going outdoors in cold weather, or sexual activity. This medicine may be used for other purposes; ask your health care provider or pharmacist if you have questions. COMMON BRAND NAME(S): Nitroquick, Nitrostat, Nitrotab What should I tell my health care provider before I take this medicine? They need to know if you have any of these conditions: -anemia -head injury, recent stroke, or bleeding in the brain -liver disease -previous heart attack -an unusual or allergic reaction to nitroglycerin, other medicines, foods, dyes, or preservatives -pregnant or trying to get pregnant -breast-feeding How should I use this medicine? Take this medicine by mouth as needed. At the first sign of an angina attack (chest pain or tightness) place one tablet  under your tongue. You can also take this medicine 5 to 10 minutes before an event likely to produce chest pain. Follow the directions on the prescription label. Let the tablet dissolve under the tongue. Do not swallow whole. Replace the dose if you accidentally swallow it. It will help if your mouth is not dry. Saliva around the tablet will help it to dissolve more quickly. Do not eat or drink, smoke or chew tobacco while a tablet is dissolving. If you are not better within 5 minutes after taking ONE dose of nitroglycerin, call 9-1-1 immediately to seek emergency medical care. Do not take more than 3 nitroglycerin tablets over 15 minutes. If you take this medicine often to relieve symptoms of angina, your doctor or health care professional may provide you with different instructions to manage your symptoms. If symptoms do not go away after following these instructions, it is important to call 9-1-1 immediately. Do not take more than 3 nitroglycerin tablets over 15 minutes. Talk to your pediatrician regarding the use of this medicine in children. Special care may be needed. Overdosage: If you think you have taken too much of this medicine contact a poison control center or emergency room at once. NOTE: This medicine is only for you. Do not share this medicine with others. What if I miss a dose? This does not apply. This medicine is only used as needed. What may interact with this medicine? Do not take this medicine with any of the following medications: -certain migraine medicines like ergotamine and dihydroergotamine (DHE) -medicines used to treat erectile dysfunction like sildenafil, tadalafil, and vardenafil -riociguat This medicine may also interact  with the following medications: -alteplase -aspirin -heparin -medicines for high blood pressure -medicines for mental depression -other medicines used to treat angina -phenothiazines like chlorpromazine, mesoridazine, prochlorperazine,  thioridazine This list may not describe all possible interactions. Give your health care provider a list of all the medicines, herbs, non-prescription drugs, or dietary supplements you use. Also tell them if you smoke, drink alcohol, or use illegal drugs. Some items may interact with your medicine. What should I watch for while using this medicine? Tell your doctor or health care professional if you feel your medicine is no longer working. Keep this medicine with you at all times. Sit or lie down when you take your medicine to prevent falling if you feel dizzy or faint after using it. Try to remain calm. This will help you to feel better faster. If you feel dizzy, take several deep breaths and lie down with your feet propped up, or bend forward with your head resting between your knees. You may get drowsy or dizzy. Do not drive, use machinery, or do anything that needs mental alertness until you know how this drug affects you. Do not stand or sit up quickly, especially if you are an older patient. This reduces the risk of dizzy or fainting spells. Alcohol can make you more drowsy and dizzy. Avoid alcoholic drinks. Do not treat yourself for coughs, colds, or pain while you are taking this medicine without asking your doctor or health care professional for advice. Some ingredients may increase your blood pressure. What side effects may I notice from receiving this medicine? Side effects that you should report to your doctor or health care professional as soon as possible: -blurred vision -dry mouth -skin rash -sweating -the feeling of extreme pressure in the head -unusually weak or tired Side effects that usually do not require medical attention (report to your doctor or health care professional if they continue or are bothersome): -flushing of the face or neck -headache -irregular heartbeat, palpitations -nausea, vomiting This list may not describe all possible side effects. Call your doctor for  medical advice about side effects. You may report side effects to FDA at 1-800-FDA-1088. Where should I keep my medicine? Keep out of the reach of children. Store at room temperature between 20 and 25 degrees C (68 and 77 degrees F). Store in [facility] [employer]. Protect from light and moisture. Keep tightly closed. Throw away any unused medicine after the expiration date. NOTE: This sheet is a summary. It may not cover all possible information. If you have questions about this medicine, talk to your doctor, pharmacist, or health care provider.  2018 Elsevier/Gold Standard (2013-03-14 17:57:36)

## 2018-01-01 NOTE — Progress Notes (Signed)
Cardiology Office Note:    Date:  01/01/2018   ID:  Autumn Bowen, DOB 02/22/1946, MRN [id number]  PCP:  Emeterio Reeve, DO  Cardiologist:  Jenean Lindau, MD   Referring MD: Emeterio Reeve, DO    ASSESSMENT:    1. Coronary artery disease involving native coronary artery of native heart without angina pectoris   2. COPD (chronic obstructive pulmonary disease) with chronic bronchitis ([employer])   3. Diabetes mellitus due to underlying condition with complication, without long-term current use of insulin ([place])   4. TOBACCO ABUSE    PLAN:    In order of problems listed above:  1. Secondary prevention stressed with the patient.  Importance of compliance with diet and medication stressed and she vocalized understanding.  Her blood pressure is stable.  Diet was discussed for diabetes mellitus dyslipidemia and obesity.  Risks of obesity explained and she vocalized understanding. 2. Patient will be seen in follow-up appointment in 6 months or earlier if the patient has any concerns 3. I spent 5 minutes with the patient discussing solely about smoking. Smoking cessation was counseled. I suggested to the patient also different medications and pharmacological interventions. Patient is keen to try stopping on its own at this time. He will get back to me if he needs any further assistance in this matter.   Medication Adjustments/Labs and Tests Ordered: Current medicines are reviewed at length with the patient today.  Concerns regarding medicines are outlined above.  No orders of the defined types were placed in this encounter.  No orders of the defined types were placed in this encounter.    Chief Complaint  Patient presents with  . Follow up Cath     History of Present Illness:    Autumn Bowen is a 72 y.o. female.  Patient was evaluated by me for chest pain.  She has significant but nonobstructive disease in the right coronary artery and preserved systolic function.  She denies  any problems at this time and takes care of activities of daily living.  She has undergone angiography and is happy about the results.  She is actively trying to quit smoking and lose weight.  At the time of my evaluation, the patient is alert awake oriented and in no distress.  Past Medical History:  Diagnosis Date  . Allergy    Notes that grass and some trees causes eyes to sting, burn, and water.  . Anemia   . Celiac disease   . Celiac disease   . COPD (chronic obstructive pulmonary disease) ([place])   . Diabetes mellitus type II   . Diverticulitis   . Dyslipidemia   . HOH (hard of hearing)   . Hypertension   . Hypothyroidism   . Sleep apnea   . Sore throat     Past Surgical History:  Procedure Laterality Date  . APPENDECTOMY    . BREAST SURGERY     h/o benign cystleft breast and lymp node removal on right breast area.  Marland Kitchen EYE SURGERY     Left eye cataract removal, right eye h/o macular degeneration.  . INTRAVASCULAR PRESSURE WIRE/FFR STUDY N/A 12/07/2017   Procedure: INTRAVASCULAR PRESSURE WIRE/FFR STUDY;  Surgeon: Leonie Man, MD;  Location: [facility];  Service: Cardiovascular;  Laterality: N/A;  . LEFT HEART CATH AND CORONARY ANGIOGRAPHY N/A 12/07/2017   Procedure: LEFT HEART CATH AND CORONARY ANGIOGRAPHY;  Surgeon: Leonie Man, MD;  Location: [facility];  Service: Cardiovascular;  Laterality:  N/A;  . SPINE SURGERY     Pt not sure of the type of surgery she had but knows it was L4-5.  Marland Kitchen VAGINAL HYSTERECTOMY      Current Medications: Current Meds  Medication Sig  . ACCU-CHEK AVIVA PLUS test strip test 1-2 TIMES daily  . acetaminophen (TYLENOL) 500 MG tablet Take 1,000 mg by mouth every 6 (six) hours as needed for mild pain or moderate pain.  Marland Kitchen albuterol (PROVENTIL HFA;VENTOLIN HFA) 108 (90 Base) MCG/ACT inhaler Inhale 2 puffs into the lungs every 6 (six) hours as needed for wheezing or shortness of breath.  . AMBULATORY NON FORMULARY MEDICATION  Glucometer, test strips and lancets for testing once a day for DM, uncontrolled.  Marland Kitchen amitriptyline (ELAVIL) 50 MG tablet TAKE 1 TABLET BY MOUTH EVERY NIGHT AT BEDTIME  . aspirin EC 81 MG tablet Take 1 tablet (81 mg total) by mouth daily.  . cetirizine (ZYRTEC) 10 MG tablet Take 10 mg by mouth at bedtime.  . CHANTIX CONTINUING MONTH PAK 1 MG tablet TAKE 1 TABLET BY MOUTH TWICE DAILY( EVERY TWELVE HOURS)  . cholecalciferol (VITAMIN D) 1000 units tablet Take 1,000 Units by mouth daily.  . colchicine 0.6 MG tablet Take 1 tablet (0.6 mg total) by mouth daily.  . diazepam (VALIUM) 5 MG tablet Take 1 tab PO 1 hour before procedure or imaging.  . dicyclomine (BENTYL) 10 MG capsule Take 1 capsule (10 mg total) 2 (two) times daily by mouth.  . EPIPEN 2-PAK 0.3 MG/0.3ML SOAJ injection USE AS DIRECTED  . ezetimibe (ZETIA) 10 MG tablet Take 1 tablet (10 mg total) by mouth daily.  . famotidine (PEPCID) 20 MG tablet Take 20 mg by mouth daily.  . Fluticasone-Salmeterol (ADVAIR) 250-50 MCG/DOSE AEPB Inhale 1 puff into the lungs every 12 (twelve) hours.  Marland Kitchen GLUCOSAMINE-CHONDROITIN PO Take 1 tablet by mouth 2 (two) times daily.   . Insulin Degludec (TRESIBA FLEXTOUCH) 200 UNIT/ML SOPN Inject 16 Units into the skin at bedtime. Increase by 2 units every 5 days until fasting glucose 90-120. (Patient taking differently: Inject 18 Units into the skin at bedtime. Increase by 2 units every 5 days until fasting glucose 90-120.)  . isosorbide mononitrate (IMDUR) 30 MG 24 hr tablet Take 1 tablet (30 mg total) by mouth daily.  Marland Kitchen levocetirizine (XYZAL) 5 MG tablet Take 5 mg by mouth every evening.  Marland Kitchen levothyroxine (SYNTHROID, LEVOTHROID) 175 MCG tablet Take 175 mcg by mouth daily before breakfast.  . LUMIGAN 0.01 % SOLN Instill 1 drop into both eyes every night  . LYRICA 50 MG capsule TAKE ONE CAPSULE TWICE DAILY BY MOUTH  . metFORMIN (GLUCOPHAGE-XR) 750 MG 24 hr tablet Take 1 tablet (750 mg total) by mouth daily with breakfast.   . Multiple Vitamins-Minerals (PRESERVISION AREDS 2 PO) Take 1 capsule by mouth 2 (two) times daily.  . ondansetron (ZOFRAN) 8 MG tablet TAKE 1 TABLET BY MOUTH EVERY 8 HOURS AS NEEDED FOR NAUSEA AND VOMITING  . promethazine (PHENERGAN) 25 MG tablet Take 1 tablet (25 mg total) by mouth every 6 (six) hours as needed for nausea or vomiting.  Marland Kitchen QUEtiapine (SEROQUEL) 200 MG tablet Take 1 tablet (200 mg total) by mouth at bedtime.  . sertraline (ZOLOFT) 100 MG tablet Take 2 tablets (200 mg total) by mouth daily.  . simvastatin (ZOCOR) 40 MG tablet Take 40 mg by mouth daily.  . SURE COMFORT PEN NEEDLES 32G X 4 MM MISC USE AS DIRECTED  . triamcinolone cream (KENALOG)  0.1 % Apply 1 application topically 2 (two) times daily. For itchy bumps.     Allergies:   Bee venom; Iodinated diagnostic agents; Penicillins; Povidone iodine; Tomato; Chocolate; Latex; Other; Atorvastatin calcium; Esomeprazole magnesium; Gluten meal; Metformin and related; Quetiapine fumarate; Trazodone and nefazodone; and Zolpidem tartrate   Social History   Socioeconomic History  . Marital status: Divorced    Spouse name: Not on file  . Number of children: Not on file  . Years of education: Not on file  . Highest education level: Not on file  Occupational History  . Not on file  Social Needs  . Financial resource strain: Not on file  . Food insecurity:    Worry: Not on file    Inability: Not on file  . Transportation needs:    Medical: Not on file    Non-medical: Not on file  Tobacco Use  . Smoking status: Current Some Day Smoker    Packs/day: 0.25    Years: 51.00    Pack years: 12.75    Types: Cigarettes    Start date: 01/02/2012  . Smokeless tobacco: Never Used  . Tobacco comment: Been down to 1  Substance and Sexual Activity  . Alcohol use: No  . Drug use: No  . Sexual activity: Not Currently  Lifestyle  . Physical activity:    Days per week: Not on file    Minutes per session: Not on file  . Stress: Not on  file  Relationships  . Social connections:    Talks on phone: Not on file    Gets together: Not on file    Attends religious service: Not on file    Active member of club or organization: Not on file    Attends meetings of clubs or organizations: Not on file    Relationship status: Not on file  Other Topics Concern  . Not on file  Social History Narrative  . Not on file     Family History: The patient's family history includes Alcohol abuse in her brother; Anxiety disorder in her mother; Asthma in her son; COPD in her father and sister; Cancer in her sister; Depression in her mother; Diabetes in her father and son; Diabetes type II in her mother; Drug abuse in her brother; Glaucoma in her father; Hashimoto's thyroiditis in her sister; Heart attack in her mother; Hypertension in her father; Kidney disease in her sister; Stroke in her mother.  ROS:   Please see the history of present illness.    All other systems reviewed and are negative.  EKGs/Labs/Other Studies Reviewed:    The following studies were reviewed today: Coronary angiography report was discussed with the patient at length.   Recent Labs: 11/27/2017: ALT 13; BUN 16; Creat 0.73; Hemoglobin 13.6; Platelets 321; Potassium 4.8; Sodium 141; TSH 11.75  Recent Lipid Panel    Component Value Date/Time   CHOL 160 11/27/2017 1420   TRIG 135 11/27/2017 1420   HDL 65 11/27/2017 1420   CHOLHDL 2.5 11/27/2017 1420   VLDL 43 (H) 12/28/2016 1158   LDLCALC 73 11/27/2017 1420    Physical Exam:    VS:  BP 120/64   Pulse 87   Ht 5' 3"  (1.6 m)   Wt 211 lb 12.8 oz (96.1 kg)   LMP 09/21/1974   SpO2 95%   BMI 37.52 kg/m     Wt Readings from Last 3 Encounters:  01/01/18 211 lb 12.8 oz (96.1 kg)  12/13/17 209 lb (94.8 kg)  12/08/17 216 lb (98 kg)     GEN: Patient is in no acute distress HEENT: Normal NECK: No JVD; No carotid bruits LYMPHATICS: No lymphadenopathy CARDIAC: Hear sounds regular, 2/6 systolic murmur at the  apex. RESPIRATORY:  Clear to auscultation without rales, wheezing or rhonchi  ABDOMEN: Soft, non-tender, non-distended MUSCULOSKELETAL:  No edema; No deformity  SKIN: Warm and dry NEUROLOGIC:  Alert and oriented x 3 PSYCHIATRIC:  Normal affect   Signed, Jenean Lindau, MD  01/01/2018 10:57 AM    [place]

## 2018-01-03 DIAGNOSIS — H524 Presbyopia: Secondary | ICD-10-CM | POA: Diagnosis not present

## 2018-01-04 ENCOUNTER — Encounter: Payer: Self-pay | Admitting: Sports Medicine

## 2018-01-04 ENCOUNTER — Ambulatory Visit (INDEPENDENT_AMBULATORY_CARE_PROVIDER_SITE_OTHER): Payer: Medicare HMO | Admitting: Sports Medicine

## 2018-01-04 DIAGNOSIS — M11262 Other chondrocalcinosis, left knee: Secondary | ICD-10-CM | POA: Diagnosis not present

## 2018-01-04 DIAGNOSIS — M5442 Lumbago with sciatica, left side: Secondary | ICD-10-CM | POA: Diagnosis not present

## 2018-01-04 DIAGNOSIS — G8929 Other chronic pain: Secondary | ICD-10-CM | POA: Diagnosis not present

## 2018-01-04 DIAGNOSIS — M47812 Spondylosis without myelopathy or radiculopathy, cervical region: Secondary | ICD-10-CM

## 2018-01-04 MED ORDER — DIAZEPAM 5 MG PO TABS: ORAL_TABLET | ORAL | 0 refills | Status: DC

## 2018-01-04 MED ORDER — COLCHICINE 0.6 MG PO TABS: 0.6000 mg | ORAL_TABLET | Freq: Every day | ORAL | 3 refills | Status: DC

## 2018-01-04 NOTE — Assessment & Plan Note (Signed)
C6-C7 protruding disc with mild central canal stenosis. Proceeding with a right C6-C7 interlaminar epidural.

## 2018-01-04 NOTE — Assessment & Plan Note (Signed)
Persistent axial back pain post L4-L5 laminectomy with decompression. There is significant facet arthritis, we are going to target these first, she does have transitional anatomy so care needs to be taken regarding the numbering scheme. I would like injections into the left and right L2-L5 facet joints based on the numbering scheme on the MRI. Return to see me 1 month after injections. Valium for preprocedural anxiolysis.

## 2018-01-04 NOTE — Progress Notes (Signed)
Subjective:    CC: MRI follow-up  HPI: Neck pain: Right-sided peri-trapezial, worse with prolonged downgaze, no progressive weakness, no trauma, constitutional symptoms.  Low back pain: Axial, occasional radiation down to the right buttock.  More severe, no bowel or bladder dysfunction, saddle numbness, constitutional symptoms.  I reviewed the past medical history, family history, social history, surgical history, and allergies today and no changes were needed.  Please see the problem list section below in epic for further details.  Past Medical History: Past Medical History:  Diagnosis Date  . Allergy    Notes that grass and some trees causes eyes to sting, burn, and water.  . Anemia   . Celiac disease   . Celiac disease   . COPD (chronic obstructive pulmonary disease) ([place])   . Diabetes mellitus type II   . Diverticulitis   . Dyslipidemia   . HOH (hard of hearing)   . Hypertension   . Hypothyroidism   . Sleep apnea   . Sore throat    Past Surgical History: Past Surgical History:  Procedure Laterality Date  . APPENDECTOMY    . BREAST SURGERY     h/o benign cystleft breast and lymp node removal on right breast area.  Marland Kitchen EYE SURGERY     Left eye cataract removal, right eye h/o macular degeneration.  . INTRAVASCULAR PRESSURE WIRE/FFR STUDY N/A 12/07/2017   Procedure: INTRAVASCULAR PRESSURE WIRE/FFR STUDY;  Surgeon: Leonie Man, MD;  Location: [place] [facility];  Service: Cardiovascular;  Laterality: N/A;  . LEFT HEART CATH AND CORONARY ANGIOGRAPHY N/A 12/07/2017   Procedure: LEFT HEART CATH AND CORONARY ANGIOGRAPHY;  Surgeon: Leonie Man, MD;  Location: [facility];  Service: Cardiovascular;  Laterality: N/A;  . SPINE SURGERY     Pt not sure of the type of surgery she had but knows it was L4-5.  Marland Kitchen VAGINAL HYSTERECTOMY     Social History: Social History   Socioeconomic History  . Marital status: Divorced    Spouse name: Not on file  . Number of  children: Not on file  . Years of education: Not on file  . Highest education level: Not on file  Occupational History  . Not on file  Social Needs  . Financial resource strain: Not on file  . Food insecurity:    Worry: Not on file    Inability: Not on file  . Transportation needs:    Medical: Not on file    Non-medical: Not on file  Tobacco Use  . Smoking status: Current Some Day Smoker    Packs/day: 0.25    Years: 51.00    Pack years: 12.75    Types: Cigarettes    Start date: 01/02/2012  . Smokeless tobacco: Never Used  . Tobacco comment: Been down to 1  Substance and Sexual Activity  . Alcohol use: No  . Drug use: No  . Sexual activity: Not Currently  Lifestyle  . Physical activity:    Days per week: Not on file    Minutes per session: Not on file  . Stress: Not on file  Relationships  . Social connections:    Talks on phone: Not on file    Gets together: Not on file    Attends religious service: Not on file    Active member of club or organization: Not on file    Attends meetings of clubs or organizations: Not on file    Relationship status: Not on file  Other Topics  Concern  . Not on file  Social History Narrative  . Not on file   Family History: Family History  Problem Relation Age of Onset  . Anxiety disorder Mother   . Depression Mother   . Heart attack Mother   . Stroke Mother   . Diabetes type II Mother   . Diabetes Father   . Glaucoma Father   . Hypertension Father   . COPD Father   . COPD Sister   . Hashimoto's thyroiditis Sister   . Cancer Sister   . Kidney disease Sister   . Drug abuse Brother   . Alcohol abuse Brother   . Asthma Son   . Diabetes Son    Allergies: Allergies  Allergen Reactions  . Bee Venom Anaphylaxis  . Iodinated Diagnostic Agents Other (See Comments)    Per patient cardiac arrest IVP DYE-CARDIAC ARREST  . Penicillins Anaphylaxis    Has patient had a PCN reaction causing immediate rash, facial/tongue/throat  swelling, SOB or lightheadedness with hypotension: yes Has patient had a PCN reaction causing severe rash involving mucus membranes or skin necrosis: no Has patient had a PCN reaction that required hospitalization: yes Has patient had a PCN reaction occurring within the last 10 years: no If all of the above answers are "NO", then may proceed with Cephalosporin use.   . Povidone Iodine Itching and Rash  . Tomato Itching  . Chocolate Rash  . Latex Rash  . Other Rash  . Atorvastatin Calcium Nausea Only  . Esomeprazole Magnesium Nausea Only  . Gluten Meal   . Metformin And Related Diarrhea  . Quetiapine Fumarate     Per pt she can not take XR  . Trazodone And Nefazodone Hives  . Zolpidem Tartrate Nausea And Vomiting   Medications: See med rec.  Review of Systems: No fevers, chills, night sweats, weight loss, chest pain, or shortness of breath.   Objective:    General: Well Developed, well nourished, and in no acute distress.  Neuro: Alert and oriented x3, extra-ocular muscles intact, sensation grossly intact.  HEENT: Normocephalic, atraumatic, pupils equal round reactive to light, neck supple, no masses, no lymphadenopathy, thyroid nonpalpable.  Skin: Warm and dry, no rashes. Cardiac: Regular rate and rhythm, no murmurs rubs or gallops, no lower extremity edema.  Respiratory: Clear to auscultation bilaterally. Not using accessory muscles, speaking in full sentences.  MRI reviewed, she has transitional anatomy, significant facet arthritis from L2-L5 bilaterally.  Cervical spine shows a large C6-C7 disc extrusion.  Impression and Recommendations:    Cervical spondylosis C6-C7 protruding disc with mild central canal stenosis. Proceeding with a right C6-C7 interlaminar epidural.  Left-sided low back pain with left-sided sciatica Persistent axial back pain post L4-L5 laminectomy with decompression. There is significant facet arthritis, we are going to target these first, she does  have transitional anatomy so care needs to be taken regarding the numbering scheme. I would like injections into the left and right L2-L5 facet joints based on the numbering scheme on the MRI. Return to see me 1 month after injections. Valium for preprocedural anxiolysis. ___________________________________________ Gwen Her. Dianah Field, M.D., ABFM., CAQSM. Primary Care and [place] Instructor of [place] of [facility]

## 2018-01-05 ENCOUNTER — Ambulatory Visit: Payer: Medicare HMO | Admitting: Physical Therapy

## 2018-01-05 ENCOUNTER — Telehealth: Payer: Self-pay | Admitting: Cardiology

## 2018-01-05 MED ORDER — ISOSORBIDE MONONITRATE ER 30 MG PO TB24: 30.0000 mg | ORAL_TABLET | Freq: Every day | ORAL | 3 refills | Status: DC

## 2018-01-05 NOTE — Telephone Encounter (Signed)
Please call Autumn Bowen regarding a medicine she was given at the hospital that she needs a script called in for. She was unsure of the name of the medicine.

## 2018-01-05 NOTE — Telephone Encounter (Signed)
Spoke with patient and she would like Rx for isosorbide sent to pharmacy.  Rx sent to pharmacy as requested.

## 2018-01-08 ENCOUNTER — Ambulatory Visit: Payer: Medicare HMO | Admitting: Physical Therapy

## 2018-01-09 ENCOUNTER — Ambulatory Visit (INDEPENDENT_AMBULATORY_CARE_PROVIDER_SITE_OTHER): Payer: Medicare HMO | Admitting: Osteopathic Medicine

## 2018-01-09 ENCOUNTER — Telehealth: Payer: Self-pay | Admitting: Osteopathic Medicine

## 2018-01-09 ENCOUNTER — Encounter: Payer: Self-pay | Admitting: Osteopathic Medicine

## 2018-01-09 VITALS — BP 109/55 | HR 87 | Temp 98.0°F | Wt 212.7 lb

## 2018-01-09 DIAGNOSIS — G8929 Other chronic pain: Secondary | ICD-10-CM

## 2018-01-09 DIAGNOSIS — Z794 Long term (current) use of insulin: Secondary | ICD-10-CM | POA: Diagnosis not present

## 2018-01-09 DIAGNOSIS — M5442 Lumbago with sciatica, left side: Secondary | ICD-10-CM | POA: Diagnosis not present

## 2018-01-09 DIAGNOSIS — M47812 Spondylosis without myelopathy or radiculopathy, cervical region: Secondary | ICD-10-CM

## 2018-01-09 DIAGNOSIS — I209 Angina pectoris, unspecified: Secondary | ICD-10-CM | POA: Diagnosis not present

## 2018-01-09 DIAGNOSIS — E1142 Type 2 diabetes mellitus with diabetic polyneuropathy: Secondary | ICD-10-CM | POA: Diagnosis not present

## 2018-01-09 LAB — POCT GLYCOSYLATED HEMOGLOBIN (HGB A1C): Hemoglobin A1C: 8.1 % — AB (ref 4.0–5.6)

## 2018-01-09 NOTE — Telephone Encounter (Signed)
error 

## 2018-01-09 NOTE — Telephone Encounter (Signed)
Left VM for Autumn Bowen at GI to advise what order Pt would like injections to be scheduled.

## 2018-01-09 NOTE — Telephone Encounter (Signed)
-----   Message from Silverio Decamp, MD sent at 01/09/2018  1:12 PM EDT ----- That is just a scheduling issue, I ordered both her lumbar facets and her cervical epidural, when they call her she just needs to tell them which when she wants done first. ___________________________________________ Gwen Her. Dianah Field, M.D., ABFM., CAQSM. Primary Care and [place] Instructor of [place] of [facility] ----- Message ----- From: Emeterio Reeve, DO Sent: 01/09/2018   1:00 PM EDT To: Silverio Decamp, MD, Jamesetta So, CMA, #  Pt wanted C-spine injected first, not L-spine? Is this a scheduling issue she handles or is there anything we have to do to change it? Sent to Dr Darene Lamer, Luetta Nutting, Reubin [place]

## 2018-01-09 NOTE — Progress Notes (Signed)
HPI: Autumn Bowen is a 72 y.o. female who  has a past medical history of Allergy, Anemia, Celiac disease, Celiac disease, COPD (chronic obstructive pulmonary disease) ([place]), Diabetes mellitus type II, Diverticulitis, Dyslipidemia, HOH (hard of hearing), Hypertension, Hypothyroidism, Sleep apnea, and Sore throat.  she presents to [facility] today, 01/09/18,  for chief complaint of:  DM2 routine follow-up Chest pain f/u Back pain f/u  DM2: A1C today 8.1%. Dental issues have hindered diet complliance, has been eating mostly soup and soft foods. BP at goal. Pt on statin. Reports eye exam done but we don't have records. LDL last check was 73 in 11/2017. Last visit we discussed titrating up a bit on Tresiba. She was on 16 units daily at that time. Now at 18 units, still having AM hyperglycemia.   Chest pain follow-up: seen by cardiologist. 12/07/17 heart cath - "Angiographically moderate mid RCA lesion that was minimally reactive and FFR. Not physiologically significant. Otherwise angiographically normal coronary arteries. Normal LV function with normal EDP" taking nitro as needed, just filled Rx for Imdur, following with cardiology. No chest pain past few days. Her son had some questions about management of the heart issue.   Has also been seen by sports med for L knee pseudogout and cervical spondylosis and lower back pain. Set up for injections into C- and L-spine. Advised follow up with Dr T one month after injections. Requests cervical spine injection first.    Patient is accompanied by  who assists with history-taking.   Past medical history, surgical history, and family history reviewed.  Current medication list and allergy/intolerance information reviewed.   (See remainder of HPI, ROS, Phys Exam below)       ASSESSMENT/PLAN:   Type 2 diabetes mellitus with diabetic polyneuropathy, with long-term current use of insulin (HCC) - discussed insulin  titration, diet control  - Plan: POCT HgB A1C  Angina pectoris ([place]) - following with cardiology.   Spondylosis of cervical region without myelopathy or radiculopathy - message sent to Dr Landis Martins - not sure if there's anything we need to do to facilitate scheduling the c-spine inejction first rather than l-spine  Chronic left-sided low back pain with left-sided sciatica     Patient Instructions  We are treating the heart blockage with medicines: isosorbide, simvastatin, aspirin, and controlling sugars plus keeping blood pressure normal. We are also working on quitting smoking! This is the most important piece.   Will continue with current medications. Keep an eye on sugars, if fasting sugars are over 120, can increase insulin by 2 units at a time, every 3 days or so. Goal A1C: 7.5% in the next 3-6 months.    Follow-up plan: Return in about 3 months (around 04/11/2018) for recheck sugars, see me sooner if needed! .                   ############################################ ############################################ ############################################ ############################################    Outpatient Encounter Medications as of 01/09/2018  Medication Sig Note  . ACCU-CHEK AVIVA PLUS test strip test 1-2 TIMES daily   . acetaminophen (TYLENOL) 500 MG tablet Take 1,000 mg by mouth every 6 (six) hours as needed for mild pain or moderate pain.   Marland Kitchen albuterol (PROVENTIL HFA;VENTOLIN HFA) 108 (90 Base) MCG/ACT inhaler Inhale 2 puffs into the lungs every 6 (six) hours as needed for wheezing or shortness of breath.   . AMBULATORY NON FORMULARY MEDICATION Glucometer, test strips and lancets for testing once a day for DM,  uncontrolled.   Marland Kitchen amitriptyline (ELAVIL) 50 MG tablet TAKE 1 TABLET BY MOUTH EVERY NIGHT AT BEDTIME   . aspirin EC 81 MG tablet Take 1 tablet (81 mg total) by mouth daily.   . cetirizine (ZYRTEC) 10 MG tablet Take 10 mg by mouth at bedtime.    . CHANTIX CONTINUING MONTH PAK 1 MG tablet TAKE 1 TABLET BY MOUTH TWICE DAILY( EVERY TWELVE HOURS)   . cholecalciferol (VITAMIN D) 1000 units tablet Take 1,000 Units by mouth daily.   . colchicine 0.6 MG tablet Take 1-2 tablets (0.6-1.2 mg total) by mouth daily.   . diazepam (VALIUM) 5 MG tablet Take 1 tab PO 1 hour before procedure or imaging.   . dicyclomine (BENTYL) 10 MG capsule Take 1 capsule (10 mg total) 2 (two) times daily by mouth.   . EPIPEN 2-PAK 0.3 MG/0.3ML SOAJ injection USE AS DIRECTED   . ezetimibe (ZETIA) 10 MG tablet Take 1 tablet (10 mg total) by mouth daily.   . famotidine (PEPCID) 20 MG tablet Take 20 mg by mouth daily.   . Fluticasone-Salmeterol (ADVAIR) 250-50 MCG/DOSE AEPB Inhale 1 puff into the lungs every 12 (twelve) hours.   Marland Kitchen GLUCOSAMINE-CHONDROITIN PO Take 1 tablet by mouth 2 (two) times daily.    . Insulin Degludec (TRESIBA FLEXTOUCH) 200 UNIT/ML SOPN Inject 16 Units into the skin at bedtime. Increase by 2 units every 5 days until fasting glucose 90-120. (Patient taking differently: Inject 18 Units into the skin at bedtime. Increase by 2 units every 5 days until fasting glucose 90-120.)   . isosorbide mononitrate (IMDUR) 30 MG 24 hr tablet Take 1 tablet (30 mg total) by mouth daily.   Marland Kitchen levocetirizine (XYZAL) 5 MG tablet Take 5 mg by mouth every evening.   Marland Kitchen levothyroxine (SYNTHROID, LEVOTHROID) 175 MCG tablet Take 175 mcg by mouth daily before breakfast.   . LUMIGAN 0.01 % SOLN Instill 1 drop into both eyes every night   . LYRICA 50 MG capsule TAKE ONE CAPSULE TWICE DAILY BY MOUTH   . metFORMIN (GLUCOPHAGE-XR) 750 MG 24 hr tablet Take 1 tablet (750 mg total) by mouth daily with breakfast.   . Multiple Vitamins-Minerals (PRESERVISION AREDS 2 PO) Take 1 capsule by mouth 2 (two) times daily.   . nitroGLYCERIN (NITROSTAT) 0.4 MG SL tablet Place 1 tablet (0.4 mg total) under the tongue every 5 (five) minutes as needed for chest pain.   Marland Kitchen ondansetron (ZOFRAN) 8 MG  tablet TAKE 1 TABLET BY MOUTH EVERY 8 HOURS AS NEEDED FOR NAUSEA AND VOMITING   . promethazine (PHENERGAN) 25 MG tablet Take 1 tablet (25 mg total) by mouth every 6 (six) hours as needed for nausea or vomiting.   Marland Kitchen QUEtiapine (SEROQUEL) 200 MG tablet Take 1 tablet (200 mg total) by mouth at bedtime.   . sertraline (ZOLOFT) 100 MG tablet Take 2 tablets (200 mg total) by mouth daily.   . simvastatin (ZOCOR) 40 MG tablet Take 40 mg by mouth daily.   . SURE COMFORT PEN NEEDLES 32G X 4 MM MISC USE AS DIRECTED   . triamcinolone cream (KENALOG) 0.1 % Apply 1 application topically 2 (two) times daily. For itchy bumps. 06/22/2016: Pt. Reports that she takes this prn.   No facility-administered encounter medications on file as of 01/09/2018.    Allergies  Allergen Reactions  . Bee Venom Anaphylaxis  . Iodinated Diagnostic Agents Other (See Comments)    Per patient cardiac arrest IVP DYE-CARDIAC ARREST  . Penicillins Anaphylaxis  Has patient had a PCN reaction causing immediate rash, facial/tongue/throat swelling, SOB or lightheadedness with hypotension: yes Has patient had a PCN reaction causing severe rash involving mucus membranes or skin necrosis: no Has patient had a PCN reaction that required hospitalization: yes Has patient had a PCN reaction occurring within the last 10 years: no If all of the above answers are "NO", then may proceed with Cephalosporin use.   . Povidone Iodine Itching and Rash  . Tomato Itching  . Chocolate Rash  . Latex Rash  . Other Rash  . Atorvastatin Calcium Nausea Only  . Esomeprazole Magnesium Nausea Only  . Gluten Meal   . Metformin And Related Diarrhea  . Quetiapine Fumarate     Per pt she can not take XR  . Trazodone And Nefazodone Hives  . Zolpidem Tartrate Nausea And Vomiting      Review of Systems:  Constitutional: No recent illness  HEENT: No  headache, no vision change  Cardiac: +occasional chest pain, No  pressure, No  palpitations  Respiratory:  No  shortness of breath. No  Cough  Gastrointestinal: No  abdominal pain, no change on bowel habits  Hem/Onc: No  easy bruising/bleeding, No  abnormal lumps/bumps  Neurologic: No  weakness, No  Dizziness  Psychiatric: No  concerns with depression, No  concerns with anxiety  Exam:  BP (!) 109/55 (BP Location: Left Arm, Patient Position: Sitting, Cuff Size: Large)   Pulse 87   Temp 98 F (36.7 C) (Oral)   Wt 212 lb 11.2 oz (96.5 kg)   LMP 09/21/1974   BMI 37.68 kg/m   Constitutional: VS see above. General Appearance: alert, well-developed, well-nourished, NAD  Eyes: Normal lids and conjunctive, non-icteric sclera  Neck: No masses, trachea midline.   Respiratory: Normal respiratory effort. no wheeze, no rhonchi, no rales  Cardiovascular: S1/S2 normal, no murmur, no rub/gallop auscultated. RRR.   Musculoskeletal: Gait normal. Symmetric and independent movement of all extremities  Neurological: Normal balance/coordination. No tremor.  Skin: warm, dry, intact.   Psychiatric: Normal judgment/insight. Normal mood and affect. Oriented x3.   Visit summary with medication list and pertinent instructions was printed for patient to review, advised to alert [place] if any changes needed. All questions at time of visit were answered - patient instructed to contact office with any additional concerns. ER/RTC precautions were reviewed with the patient and understanding verbalized.   Follow-up plan: Return in about 3 months (around 04/11/2018) for recheck sugars, see me sooner if needed! .  Note: Total time spent 25 minutes, greater than 50% of the visit was spent face-to-face counseling and coordinating care for the following: The primary encounter diagnosis was Type 2 diabetes mellitus with diabetic polyneuropathy, with long-term current use of insulin ([place]). Diagnoses of Angina pectoris ([place]), Spondylosis of cervical region without myelopathy or radiculopathy, and  Chronic left-sided low back pain with left-sided sciatica were also pertinent to this visit.Marland Kitchen  Please note: voice recognition software was used to produce this document, and typos may escape review. Please contact Dr. Sheppard Coil for any needed clarifications.

## 2018-01-09 NOTE — Patient Instructions (Signed)
We are treating the heart blockage with medicines: isosorbide, simvastatin, aspirin, and controlling sugars plus keeping blood pressure normal. We are also working on quitting smoking! This is the most important piece.   Will continue with current medications. Keep an eye on sugars, if fasting sugars are over 120, can increase insulin by 2 units at a time, every 3 days or so. Goal A1C: 7.5% in the next 3-6 months.

## 2018-01-12 ENCOUNTER — Ambulatory Visit: Payer: Medicare HMO | Admitting: Physical Therapy

## 2018-01-17 ENCOUNTER — Ambulatory Visit: Payer: Self-pay | Admitting: Osteopathic Medicine

## 2018-01-22 ENCOUNTER — Telehealth: Payer: Self-pay | Admitting: Osteopathic Medicine

## 2018-01-22 ENCOUNTER — Other Ambulatory Visit: Payer: Self-pay | Admitting: Osteopathic Medicine

## 2018-01-22 NOTE — Telephone Encounter (Addendum)
[facility] called due to not being able to set up an appointment for Autumn Bowen. This was due to her past of negatively reacting to the contrast.   Please Advise.

## 2018-01-25 ENCOUNTER — Other Ambulatory Visit: Payer: Self-pay | Admitting: Osteopathic Medicine

## 2018-01-25 DIAGNOSIS — E1169 Type 2 diabetes mellitus with other specified complication: Secondary | ICD-10-CM

## 2018-01-25 DIAGNOSIS — E785 Hyperlipidemia, unspecified: Principal | ICD-10-CM

## 2018-01-25 NOTE — Telephone Encounter (Signed)
[facility] pharmacy requesting med RFs for chantix, zetia and simvastatin. Rx for simvastatin written by historical provider.

## 2018-01-26 ENCOUNTER — Other Ambulatory Visit: Payer: Self-pay

## 2018-01-26 NOTE — Telephone Encounter (Signed)
Pt has been updated. Requested next available appt w/provider. Pt was transferred to [place] for scheduling.

## 2018-02-06 ENCOUNTER — Ambulatory Visit: Payer: Self-pay | Admitting: Osteopathic Medicine

## 2018-02-11 ENCOUNTER — Other Ambulatory Visit: Payer: Self-pay | Admitting: Osteopathic Medicine

## 2018-02-14 ENCOUNTER — Other Ambulatory Visit: Payer: Self-pay

## 2018-02-21 ENCOUNTER — Encounter: Payer: Self-pay | Admitting: Physician Assistant

## 2018-02-21 ENCOUNTER — Ambulatory Visit (INDEPENDENT_AMBULATORY_CARE_PROVIDER_SITE_OTHER): Payer: Medicare HMO | Admitting: Physician Assistant

## 2018-02-21 ENCOUNTER — Ambulatory Visit (INDEPENDENT_AMBULATORY_CARE_PROVIDER_SITE_OTHER): Payer: Medicare HMO

## 2018-02-21 VITALS — BP 117/76 | HR 98 | Temp 98.2°F | Wt 211.0 lb

## 2018-02-21 DIAGNOSIS — I209 Angina pectoris, unspecified: Secondary | ICD-10-CM

## 2018-02-21 DIAGNOSIS — R809 Proteinuria, unspecified: Secondary | ICD-10-CM

## 2018-02-21 DIAGNOSIS — W19XXXA Unspecified fall, initial encounter: Secondary | ICD-10-CM | POA: Diagnosis not present

## 2018-02-21 DIAGNOSIS — M25562 Pain in left knee: Secondary | ICD-10-CM | POA: Diagnosis not present

## 2018-02-21 DIAGNOSIS — I2510 Atherosclerotic heart disease of native coronary artery without angina pectoris: Secondary | ICD-10-CM

## 2018-02-21 DIAGNOSIS — E118 Type 2 diabetes mellitus with unspecified complications: Secondary | ICD-10-CM | POA: Diagnosis not present

## 2018-02-21 DIAGNOSIS — E039 Hypothyroidism, unspecified: Secondary | ICD-10-CM | POA: Diagnosis not present

## 2018-02-21 DIAGNOSIS — Z23 Encounter for immunization: Secondary | ICD-10-CM

## 2018-02-21 DIAGNOSIS — S8992XA Unspecified injury of left lower leg, initial encounter: Secondary | ICD-10-CM

## 2018-02-21 DIAGNOSIS — E785 Hyperlipidemia, unspecified: Secondary | ICD-10-CM

## 2018-02-21 DIAGNOSIS — Z794 Long term (current) use of insulin: Secondary | ICD-10-CM | POA: Diagnosis not present

## 2018-02-21 DIAGNOSIS — E1129 Type 2 diabetes mellitus with other diabetic kidney complication: Secondary | ICD-10-CM | POA: Insufficient documentation

## 2018-02-21 DIAGNOSIS — R635 Abnormal weight gain: Secondary | ICD-10-CM

## 2018-02-21 DIAGNOSIS — E1169 Type 2 diabetes mellitus with other specified complication: Secondary | ICD-10-CM | POA: Insufficient documentation

## 2018-02-21 DIAGNOSIS — M7989 Other specified soft tissue disorders: Secondary | ICD-10-CM | POA: Diagnosis not present

## 2018-02-21 LAB — POCT UA - MICROALBUMIN
Creatinine, POC: 200 mg/dL
Microalbumin Ur, POC: 80 mg/L

## 2018-02-21 MED ORDER — LOSARTAN POTASSIUM 25 MG PO TABS: 25.0000 mg | ORAL_TABLET | Freq: Every day | ORAL | 5 refills | Status: DC

## 2018-02-21 MED ORDER — ZOSTER VAC RECOMB ADJUVANTED 50 MCG/0.5ML IM SUSR: 0.5000 mL | Freq: Once | INTRAMUSCULAR | 1 refills | Status: AC

## 2018-02-21 MED ORDER — INSULIN DEGLUDEC 200 UNIT/ML ~~LOC~~ SOPN: 20.0000 [IU] | PEN_INJECTOR | Freq: Every day | SUBCUTANEOUS | Status: DC

## 2018-02-21 MED ORDER — DOXYCYCLINE HYCLATE 100 MG PO TABS: 100.0000 mg | ORAL_TABLET | Freq: Two times a day (BID) | ORAL | 0 refills | Status: DC

## 2018-02-21 MED ORDER — EMPAGLIFLOZIN 10 MG PO TABS: 10.0000 mg | ORAL_TABLET | Freq: Every day | ORAL | 1 refills | Status: DC

## 2018-02-21 NOTE — Progress Notes (Signed)
HPI:                                                                Autumn Bowen is a 72 y.o. female who presents to [facility]: [place] today for multiple concerns  Pleasant 72 yo F with PMH CAD, COPD, OSA, DM2, mood disorder. She has many concerns about her health today.  She underwent cardiac catheterization 12/07/17 which revealed 60% stenosis of RCA with negative FFR. She is currently on Imdur and denies chest pain. She wants a referral to a new heart specialist. She saw Dr. [name] on 01/01/18 and she states "he did not care about me." She is worried about the blockage in her heart and is requesting to see the blockage. She wants to know if this blockage can be removed.   States her blood sugars are up and down 100's-160's. Today FBG was 101. Last A1c 8.1, 1 month ago. Currently she is taking Metformin and 20 Units of insulin nightly.  Requesting thyroid labs and SHBG test because she has difficulty losing weight. She states her thyroid has been "all over the place." She currently takes Synthroid 150 mcg daily. She is having trouble losing weight. Attributes this to "all the steroids they were pumping into me in the hospital"   She is currently taking Chantix and has reduced cigarettes to 6/day  She fell at home about 1 week ago. landed on carpet and injured her left knee. Knee has been tender and swollen. She states she has had fluid taken off this knee by Dr. Darene Lamer in the past.  Lastly, requesting shingles and flu vaccines today.  Past Medical History:  Diagnosis Date  . Allergy    Notes that grass and some trees causes eyes to sting, burn, and water.  . Anemia   . Celiac disease   . Celiac disease   . COPD (chronic obstructive pulmonary disease) ([place])   . Diabetes mellitus type II   . Diverticulitis   . Dyslipidemia   . HOH (hard of hearing)   . Hypertension   . Hypothyroidism   . Sleep apnea   . Sore throat    Past Surgical  History:  Procedure Laterality Date  . APPENDECTOMY    . BREAST SURGERY     h/o benign cystleft breast and lymp node removal on right breast area.  Marland Kitchen EYE SURGERY     Left eye cataract removal, right eye h/o macular degeneration.  . INTRAVASCULAR PRESSURE WIRE/FFR STUDY N/A 12/07/2017   Procedure: INTRAVASCULAR PRESSURE WIRE/FFR STUDY;  Surgeon: Leonie Man, MD;  Location: [facility];  Service: Cardiovascular;  Laterality: N/A;  . LEFT HEART CATH AND CORONARY ANGIOGRAPHY N/A 12/07/2017   Procedure: LEFT HEART CATH AND CORONARY ANGIOGRAPHY;  Surgeon: Leonie Man, MD;  Location: [facility];  Service: Cardiovascular;  Laterality: N/A;  . SPINE SURGERY     Pt not sure of the type of surgery she had but knows it was L4-5.  Marland Kitchen VAGINAL HYSTERECTOMY     Social History   Tobacco Use  . Smoking status: Current Some Day Smoker    Packs/day: 0.25    Years: 51.00    Pack years: 12.75    Types:  Cigarettes    Start date: 01/02/2012  . Smokeless tobacco: Never Used  . Tobacco comment: Been down to 1  Substance Use Topics  . Alcohol use: No   family history includes Alcohol abuse in her brother; Anxiety disorder in her mother; Asthma in her son; COPD in her father and sister; Cancer in her sister; Depression in her mother; Diabetes in her father and son; Diabetes type II in her mother; Drug abuse in her brother; Glaucoma in her father; Hashimoto's thyroiditis in her sister; Heart attack in her mother; Hypertension in her father; Kidney disease in her sister; Stroke in her mother.    ROS: negative except as noted in the HPI  Medications: Current Outpatient Medications  Medication Sig Dispense Refill  . ACCU-CHEK AVIVA PLUS test strip test 1-2 TIMES daily 100 each 11  . acetaminophen (TYLENOL) 500 MG tablet Take 1,000 mg by mouth every 6 (six) hours as needed for mild pain or moderate pain.    Marland Kitchen albuterol (PROVENTIL HFA;VENTOLIN HFA) 108 (90 Base) MCG/ACT inhaler Inhale 2  puffs into the lungs every 6 (six) hours as needed for wheezing or shortness of breath. 2 Inhaler 5  . AMBULATORY NON FORMULARY MEDICATION Glucometer, test strips and lancets for testing once a day for DM, uncontrolled. 100 strip 0  . amitriptyline (ELAVIL) 50 MG tablet TAKE 1 TABLET BY MOUTH EVERY NIGHT AT BEDTIME 90 tablet 0  . aspirin EC 81 MG tablet Take 1 tablet (81 mg total) by mouth daily. 90 tablet 3  . cetirizine (ZYRTEC) 10 MG tablet Take 10 mg by mouth at bedtime.    . CHANTIX 1 MG tablet TAKE 1 TABLET BY MOUTH TWICE DAILY( EVERY TWELVE HOURS) 56 tablet 0  . cholecalciferol (VITAMIN D) 1000 units tablet Take 1,000 Units by mouth daily.    . colchicine 0.6 MG tablet Take 1-2 tablets (0.6-1.2 mg total) by mouth daily. 60 tablet 3  . diazepam (VALIUM) 5 MG tablet Take 1 tab PO 1 hour before procedure or imaging. 5 tablet 0  . dicyclomine (BENTYL) 10 MG capsule Take 1 capsule (10 mg total) 2 (two) times daily by mouth. 180 capsule 3  . EPIPEN 2-PAK 0.3 MG/0.3ML SOAJ injection USE AS DIRECTED 1 Device 1  . ezetimibe (ZETIA) 10 MG tablet TAKE 1 TABLET BY MOUTH EVERY DAY 90 tablet 0  . famotidine (PEPCID) 20 MG tablet Take 20 mg by mouth daily.    . Fluticasone-Salmeterol (ADVAIR) 250-50 MCG/DOSE AEPB Inhale 1 puff into the lungs every 12 (twelve) hours. 60 each 11  . GLUCOSAMINE-CHONDROITIN PO Take 1 tablet by mouth 2 (two) times daily.     . Insulin Degludec (TRESIBA FLEXTOUCH) 200 UNIT/ML SOPN Inject 20 Units into the skin at bedtime. Increase by 2 units every 5 days until fasting glucose 90-120.    Marland Kitchen isosorbide mononitrate (IMDUR) 30 MG 24 hr tablet Take 1 tablet (30 mg total) by mouth daily. 30 tablet 3  . levocetirizine (XYZAL) 5 MG tablet Take 5 mg by mouth every evening.    Marland Kitchen levothyroxine (SYNTHROID, LEVOTHROID) 150 MCG tablet Take 150 mcg by mouth daily before breakfast.    . LUMIGAN 0.01 % SOLN Instill 1 drop into both eyes every night  3  . LYRICA 50 MG capsule TAKE ONE CAPSULE  TWICE DAILY BY MOUTH 180 capsule 1  . metFORMIN (GLUCOPHAGE-XR) 750 MG 24 hr tablet TAKE 1 TABLET(750 MG TOTAL) BY MOUTH DAILY WITH BREAKFAST. 90 tablet 0  . Multiple Vitamins-Minerals (PRESERVISION  AREDS 2 PO) Take 1 capsule by mouth 2 (two) times daily.    . nitroGLYCERIN (NITROSTAT) 0.4 MG SL tablet Place 1 tablet (0.4 mg total) under the tongue every 5 (five) minutes as needed for chest pain. 25 tablet 11  . ondansetron (ZOFRAN) 8 MG tablet TAKE 1 TABLET BY MOUTH EVERY 8 HOURS AS NEEDED FOR NAUSEA AND VOMITING 20 tablet 2  . promethazine (PHENERGAN) 25 MG tablet Take 1 tablet (25 mg total) by mouth every 6 (six) hours as needed for nausea or vomiting. 30 tablet 0  . QUEtiapine (SEROQUEL) 200 MG tablet Take 1 tablet (200 mg total) by mouth at bedtime. 90 tablet 1  . sertraline (ZOLOFT) 100 MG tablet Take 2 tablets (200 mg total) by mouth daily. 120 tablet 1  . simvastatin (ZOCOR) 40 MG tablet TAKE 1 TABLET BY MOUTH EVERY NIGHT AT BEDTIME 90 tablet 0  . SURE COMFORT PEN NEEDLES 32G X 4 MM MISC USE AS DIRECTED 100 each prn  . triamcinolone cream (KENALOG) 0.1 % Apply 1 application topically 2 (two) times daily. For itchy bumps. 60 g 1  . doxycycline (VIBRA-TABS) 100 MG tablet Take 1 tablet (100 mg total) by mouth 2 (two) times daily. 14 tablet 0  . empagliflozin (JARDIANCE) 10 MG TABS tablet Take 10 mg by mouth daily. 90 tablet 1  . losartan (COZAAR) 25 MG tablet Take 1 tablet (25 mg total) by mouth daily. 30 tablet 5  . Zoster Vaccine Adjuvanted [facility]) injection Inject 0.5 mLs into the muscle once for 1 dose. Repeat in 2-6 months 0.5 mL 1   No current facility-administered medications for this visit.    Allergies  Allergen Reactions  . Bee Venom Anaphylaxis  . Iodinated Diagnostic Agents Other (See Comments)    Per patient cardiac arrest IVP DYE-CARDIAC ARREST  . Penicillins Anaphylaxis    Has patient had a PCN reaction causing immediate rash, facial/tongue/throat swelling, SOB or  lightheadedness with hypotension: yes Has patient had a PCN reaction causing severe rash involving mucus membranes or skin necrosis: no Has patient had a PCN reaction that required hospitalization: yes Has patient had a PCN reaction occurring within the last 10 years: no If all of the above answers are "NO", then may proceed with Cephalosporin use.   . Povidone Iodine Itching and Rash  . Tomato Itching  . Chocolate Rash  . Latex Rash  . Other Rash  . Atorvastatin Calcium Nausea Only  . Esomeprazole Magnesium Nausea Only  . Gluten Meal   . Metformin And Related Diarrhea  . Quetiapine Fumarate     Per pt she can not take XR  . Trazodone And Nefazodone Hives  . Zolpidem Tartrate Nausea And Vomiting       Objective:  BP 117/76 (BP Location: Left Arm, Patient Position: Sitting, Cuff Size: Normal)   Pulse 98   Temp 98.2 F (36.8 C) (Oral)   Wt 211 lb (95.7 kg)   LMP 09/21/1974   SpO2 98%   BMI 37.38 kg/m  Gen:  alert, not ill-appearing, no distress, appropriate for age, obese female HEENT: head normocephalic without obvious abnormality, conjunctiva and cornea clear, trachea midline Pulm: Normal work of breathing, normal phonation Neuro: alert and oriented x 3, no tremor MSK: extremities atraumatic, normal gait and station Left knee: 2 abrasions overlying the patella which are scabbed over, knee is visibly red and swollen, there is medial joint line tenderness   Diabetic Foot Exam - Simple   Simple Foot Form Diabetic  Foot exam was performed with the following findings:  Yes 02/21/2018  3:25 PM  Visual Inspection See comments:  Yes Sensation Testing See comments:  Yes Pulse Check Posterior Tibialis and Dorsalis pulse intact bilaterally:  Yes Comments Bunion and callus of left MTP Sensation is not intact to monofilament at the left great toe (#1) and plantar arch (#7)     Lab Results  Component Value Date   CREATININE 0.73 11/27/2017   BUN 16 11/27/2017   NA 141  11/27/2017   K 4.8 11/27/2017   CL 102 11/27/2017   CO2 31 11/27/2017   Lab Results  Component Value Date   HGBA1C 8.1 (A) 01/09/2018     Results for orders placed or performed in visit on 02/21/18 (from the past 72 hour(s))  POCT UA - Microalbumin     Status: None   Collection Time: 02/21/18  4:22 PM  Result Value Ref Range   Microalbumin Ur, POC 80 mg/L   Creatinine, POC 200 mg/dL   Albumin/Creatinine Ratio, Urine, POC 30 - 300 mg     Comment: abnormal   No results found.    Assessment and Plan: 72 y.o. female with   .Diagnoses and all orders for this visit:  Coronary artery disease involving native coronary artery of native heart without angina pectoris -     Ambulatory referral to Cardiology  Angina pectoris [facility]) -     Ambulatory referral to Cardiology  Abnormal weight gain -     Thyroid Panel With TSH -     Sex hormone binding globulin  Hypothyroidism, unspecified type -     Thyroid Panel With TSH -     Sex hormone binding globulin  Type 2 diabetes mellitus with complication, with long-term current use of insulin (HCC) -     empagliflozin (JARDIANCE) 10 MG TABS tablet; Take 10 mg by mouth daily. -     Insulin Degludec (TRESIBA FLEXTOUCH) 200 UNIT/ML SOPN; Inject 20 Units into the skin at bedtime. Increase by 2 units every 5 days until fasting glucose 90-120. -     POCT UA - Microalbumin  Injury of left knee, initial encounter -     DG Knee Complete 4 Views Left -     doxycycline (VIBRA-TABS) 100 MG tablet; Take 1 tablet (100 mg total) by mouth 2 (two) times daily.  Microalbuminuria due to type 2 diabetes mellitus (HCC) -     losartan (COZAAR) 25 MG tablet; Take 1 tablet (25 mg total) by mouth daily.  Hyperlipidemia associated with type 2 diabetes mellitus ([place])  Need for shingles vaccine -     Zoster Vaccine Adjuvanted [facility]) injection; Inject 0.5 mLs into the muscle once for 1 dose. Repeat in 2-6 months  Need for influenza vaccination -     Flu  vaccine HIGH DOSE PF (Fluzone High dose)    1. CAD - we reviewed the results of her cardiac catheterization and discussed the 60% blockage in her RCA. We discussed treatment for this is medical management including baby aspirin, statin therapy and BP control. I am also starting her on Jardiance for co-morbid diabetes. We discussed the cardiovascular benefits of this medication. I have placed a new referral to Dr. Stanford Breed at patient's request  2. Type 2 Diabetes - foot exam performed today, mild peripheral neuropathy noted, no diabetic ulcers. Urine microalbumin abnormal today. Her BP is soft, so we will start low-dose ARB Starting Jardiance 10 mg daily for glycemic control and co-morbid CAD Cont Metformin  and insulin She may also benefit from switching from insulin to a GLP-1 to assist with weight loss Follow-up with PCP in 2 months  3. We discussed her weight gain and thyroid disease. In reviewing her past weights, her weight is increased about 10 pounds compared to last year. Contributory factors include: postmenopausal status, hypothyroidism, medications (prednisone, insulin), smoking cessation, and sedentary lifestyle. Agree to check the thyroid panel she requested. Thyroid hormone increases myocardial oxygen demand, which is associated with a small risk of inducing cardiac arrhythmias, angina pectoris, or myocardial infarction in older patients. Dose should not exceed 1.6 mcg/kg/day.  4. Left knee injury - X-ray ordered to assess for fracture. Sports Medicine consulted (see A&P from Dr. Lynne Leader) Follow-up in 1 week w/Dr. Georgina Snell  5. High-dose flu given in office today. Prescription for Shingrix sent to local pharmacy.   Patient education and anticipatory guidance given Patient agrees with treatment plan   I spent 40 minutes with this patient, greater than 50% was face-to-face time counseling regarding the above diagnoses  Darlyne Russian PA-C

## 2018-02-21 NOTE — Patient Instructions (Addendum)
START DOXYCYCLINE.  Recheck with Dr Georgina Snell next week.    Continue your Metformin and Insulin as you are taking it Start Jardiance daily. This will help to lower your blood sugar and protect you from a future heart attack. You can take Jardiance with your Metformin. Follow-up in 2 months to repeat your A1C test  Diabetes Preventive Care: - annual foot exam  - annual dilated eye exam with an eye doctor - self foot exams at least weekly - pneumonia vaccine once (booster in 5 years and at age 10) - annual influenza vaccine - twice yearly dental cleanings and yearly exam - goal blood pressure <140/90, ideally <130/80 - LDL cholesterol <70 - A1C <7.0 - body mass index (BMI) <25.0 - follow-up every 3 months if your A1C is not at goal - follow-up every 6 months if diabetes is well controlled

## 2018-02-21 NOTE — Progress Notes (Signed)
I saw Autumn Bowen.  She has what appears to be cellulitis overlying prepatellar bursitis.  Plan for oral doxycycline and recheck with me early next week.  Xray no acute fracture per my read awating radiology review.

## 2018-02-24 ENCOUNTER — Emergency Department (INDEPENDENT_AMBULATORY_CARE_PROVIDER_SITE_OTHER)
Admission: EM | Admit: 2018-02-24 | Discharge: 2018-02-24 | Disposition: A | Discharge status: Home / Self Care | Payer: Medicare HMO | Source: Home / Self Care | Attending: Family Medicine | Admitting: Family Medicine

## 2018-02-24 ENCOUNTER — Other Ambulatory Visit: Payer: Self-pay | Admitting: Osteopathic Medicine

## 2018-02-24 ENCOUNTER — Other Ambulatory Visit: Payer: Self-pay

## 2018-02-24 DIAGNOSIS — L03116 Cellulitis of left lower limb: Secondary | ICD-10-CM

## 2018-02-24 DIAGNOSIS — Z0189 Encounter for other specified special examinations: Secondary | ICD-10-CM | POA: Diagnosis not present

## 2018-02-24 LAB — POCT CBC W AUTO DIFF (K'VILLE URGENT CARE)

## 2018-02-24 MED ORDER — MUPIROCIN 2 % EX OINT: 1.0000 "application " | TOPICAL_OINTMENT | Freq: Three times a day (TID) | CUTANEOUS | 0 refills | Status: DC

## 2018-02-24 MED ORDER — CLINDAMYCIN HCL 300 MG PO CAPS: ORAL_CAPSULE | ORAL | 0 refills | Status: DC

## 2018-02-24 NOTE — Discharge Instructions (Signed)
Stop doxycycline.  Change bandage on knee wound with each application of mupirocin ointment (3 times daily). Apply heating pad to left lower leg 2 or 3 times daily.

## 2018-02-24 NOTE — ED Triage Notes (Signed)
[facility] a week and a half ago. Saw PCP on Wed, was rxd doxycycline. Leg not improving. Pain radiating down from knee. Taking ibuprofen prn. Pain level 7/10 Thinks she needs stronger antibiotic.

## 2018-02-24 NOTE — ED Provider Notes (Signed)
[facility]    CSN: [id number] Arrival date & time: 02/24/18  1649     History   Chief Complaint Chief Complaint  Patient presents with  . Leg Pain    infection in left leg    HPI Autumn Bowen is a 72 y.o. female.   Patient fell 8 days ago, abrading her left anterior knee.  The area became increasingly sore and erythematous.  Thre days ago she visited her PCP who diagnosed cellulitis overlying prepatellar bursitis, and prescribed doxycycline. She presents today complaining of persistent pain and redness of her left anterior knee, although no worse.  She states that she has had chills/sweats but no fever.  She also complains of a burning sensation and bruising down the pre-tibial area of her right lower leg, but denies calf pain. She reports that she has an upcoming follow-up appointment with her PCP.  The history is provided by the patient.  Knee Pain  Location:  Knee Time since incident:  8 days Injury: yes   Mechanism of injury: fall   Knee location:  L knee Pain details:    Quality:  Aching   Radiates to: left lower leg.   Severity:  Moderate   Onset quality:  Gradual   Duration:  8 days   Timing:  Constant   Progression:  Unchanged Chronicity:  New Prior injury to area:  No Relieved by:  Nothing Worsened by:  Flexion and extension Ineffective treatments: doxycycline. Associated symptoms: decreased ROM, stiffness and swelling   Associated symptoms: no fever and no tingling     Past Medical History:  Diagnosis Date  . Allergy    Notes that grass and some trees causes eyes to sting, burn, and water.  . Anemia   . Celiac disease   . Celiac disease   . COPD (chronic obstructive pulmonary disease) ([place])   . Diabetes mellitus type II   . Diverticulitis   . Dyslipidemia   . HOH (hard of hearing)   . Hypertension   . Hypothyroidism   . Sleep apnea   . Sore throat     Patient Active Problem List   Diagnosis Date Noted  . Abnormal weight  gain 02/21/2018  . Injury of left knee 02/21/2018  . Microalbuminuria due to type 2 diabetes mellitus ([place]) 02/21/2018  . Hyperlipidemia associated with type 2 diabetes mellitus ([place]) 02/21/2018  . CAD (coronary artery disease) 01/01/2018  . Chronic pain syndrome 12/13/2017  . Angina pectoris ([place]) 12/07/2017  . Diabetes mellitus due to underlying condition with unspecified complications ([place]) 56/97/9480  . Radiographic dye allergy status 12/05/2017  . Pseudogout of left knee 11/16/2017  . Hypotension 09/22/2016  . Osteopenia 05/24/2016  . Abdominal pain 04/12/2016  . Diverticulitis of colon 04/12/2016  . Broken or cracked tooth, nontraumatic 03/27/2016  . Memory deficit 05/05/2015  . Balance problems 05/05/2015  . Hearing difficulty of both ears 05/05/2015  . Mouth lesion 05/04/2015  . OSA (obstructive sleep apnea) 04/28/2015  . Seborrheic keratoses 01/31/2015  . Left-sided low back pain with left-sided sciatica 01/31/2015  . GERD (gastroesophageal reflux disease) 01/26/2015  . Celiac disease 01/26/2015  . Cervical spondylosis 12/04/2014  . Osteoarthritis of first metatarsophalangeal joint 11/17/2014  . Ingrown left big toenail 10/04/2014  . Seasonal allergies 10/04/2014  . Neuropathy 10/04/2014  . Toenail fungus 10/03/2014  . Adenomatous polyp of colon 07/30/2014  . Macular degeneration, dry 07/29/2014  . Type 2 diabetes mellitus with complication ([place]) 16/55/3748  . Migraine without  aura and without status migrainosus, not intractable 07/28/2014  . EMPHYSEMA 05/11/2009  . COPD (chronic obstructive pulmonary disease) with chronic bronchitis ([place]) 05/10/2009  . VISION DISORDER 12/03/2008  . OSTEOPENIA 11/18/2008  . VITAMIN D DEFICIENCY 11/06/2008  . Major depressive disorder 11/05/2008  . FATIGUE 11/05/2008  . ANEMIA 10/23/2008  . DYSLIPIDEMIA 10/16/2008  . TOBACCO ABUSE 10/15/2008  . ALLERGIC RHINITIS 10/15/2008  . FRACTURE, ANKLE, RIGHT 04/29/2008  . DIARRHEA  07/01/2007  . POST TRAUMATIC STRESS SYNDROME 05/30/2000  . Hypothyroidism 05/30/1988  . POSTMENOPAUSAL STATUS 05/30/1968    Past Surgical History:  Procedure Laterality Date  . APPENDECTOMY    . BREAST SURGERY     h/o benign cystleft breast and lymp node removal on right breast area.  Marland Kitchen EYE SURGERY     Left eye cataract removal, right eye h/o macular degeneration.  . INTRAVASCULAR PRESSURE WIRE/FFR STUDY N/A 12/07/2017   Procedure: INTRAVASCULAR PRESSURE WIRE/FFR STUDY;  Surgeon: Leonie Man, MD;  Location: [facility];  Service: Cardiovascular;  Laterality: N/A;  . LEFT HEART CATH AND CORONARY ANGIOGRAPHY N/A 12/07/2017   Procedure: LEFT HEART CATH AND CORONARY ANGIOGRAPHY;  Surgeon: Leonie Man, MD;  Location: [facility];  Service: Cardiovascular;  Laterality: N/A;  . SPINE SURGERY     Pt not sure of the type of surgery she had but knows it was L4-5.  Marland Kitchen VAGINAL HYSTERECTOMY      OB History   None      Home Medications    Prior to Admission medications   Medication Sig Start Date End Date Taking? Authorizing Provider  ACCU-CHEK AVIVA PLUS test strip test 1-2 TIMES daily 03/02/17   Emeterio Reeve, DO  acetaminophen (TYLENOL) 500 MG tablet Take 1,000 mg by mouth every 6 (six) hours as needed for mild pain or moderate pain.    [provider]  albuterol (PROVENTIL HFA;VENTOLIN HFA) 108 (90 Base) MCG/ACT inhaler Inhale 2 puffs into the lungs every 6 (six) hours as needed for wheezing or shortness of breath. 01/20/17   Emeterio Reeve, DO  AMBULATORY NON FORMULARY MEDICATION Glucometer, test strips and lancets for testing once a day for DM, uncontrolled. 10/04/14   Breeback, Jade L, PA-C  amitriptyline (ELAVIL) 50 MG tablet TAKE 1 TABLET BY MOUTH EVERY NIGHT AT BEDTIME 02/12/18   Emeterio Reeve, DO  aspirin EC 81 MG tablet Take 1 tablet (81 mg total) by mouth daily. 12/05/17   Revankar, Reita Cliche, MD  cetirizine (ZYRTEC) 10 MG tablet Take 10 mg by  mouth at bedtime.    [provider]  CHANTIX 1 MG tablet TAKE 1 TABLET BY MOUTH TWICE DAILY( EVERY TWELVE HOURS) 01/26/18   Emeterio Reeve, DO  cholecalciferol (VITAMIN D) 1000 units tablet Take 1,000 Units by mouth daily.    [provider]  clindamycin (CLEOCIN) 300 MG capsule Take one cap by mouth every 8 hours. 02/24/18   Kandra Nicolas, MD  colchicine 0.6 MG tablet Take 1-2 tablets (0.6-1.2 mg total) by mouth daily. 01/04/18   Silverio Decamp, MD  diazepam (VALIUM) 5 MG tablet Take 1 tab PO 1 hour before procedure or imaging. 01/04/18   Silverio Decamp, MD  dicyclomine (BENTYL) 10 MG capsule Take 1 capsule (10 mg total) 2 (two) times daily by mouth. 04/13/17   Emeterio Reeve, DO  doxycycline (VIBRA-TABS) 100 MG tablet Take 1 tablet (100 mg total) by mouth 2 (two) times daily. 02/21/18   Gregor Hams, MD  empagliflozin (JARDIANCE) 10 MG TABS tablet Take 10 mg by mouth daily. 02/21/18   Trixie Dredge, PA-C  EPIPEN 2-PAK 0.3 MG/0.3ML SOAJ injection USE AS DIRECTED 10/20/16   Breeback, Jade L, PA-C  ezetimibe (ZETIA) 10 MG tablet TAKE 1 TABLET BY MOUTH EVERY DAY 01/26/18   Emeterio Reeve, DO  famotidine (PEPCID) 20 MG tablet Take 20 mg by mouth daily.    [provider]  Fluticasone-Salmeterol (ADVAIR) 250-50 MCG/DOSE AEPB Inhale 1 puff into the lungs every 12 (twelve) hours. 01/20/17   Emeterio Reeve, DO  GLUCOSAMINE-CHONDROITIN PO Take 1 tablet by mouth 2 (two) times daily.     [provider]  Insulin Degludec (TRESIBA FLEXTOUCH) 200 UNIT/ML SOPN Inject 20 Units into the skin at bedtime. Increase by 2 units every 5 days until fasting glucose 90-120. 02/21/18   Trixie Dredge, PA-C  isosorbide mononitrate (IMDUR) 30 MG 24 hr tablet Take 1 tablet (30 mg total) by mouth daily. 01/05/18   Richardo Priest, MD  levocetirizine (XYZAL) 5 MG tablet Take 5 mg by mouth every evening.    [provider]    levothyroxine (SYNTHROID, LEVOTHROID) 150 MCG tablet Take 150 mcg by mouth daily before breakfast.    [provider]  losartan (COZAAR) 25 MG tablet Take 1 tablet (25 mg total) by mouth daily. 02/21/18   Trixie Dredge, PA-C  LUMIGAN 0.01 % SOLN Instill 1 drop into both eyes every night 08/17/17   [provider]  LYRICA 50 MG capsule TAKE ONE CAPSULE TWICE DAILY BY MOUTH 07/21/17   Emeterio Reeve, DO  metFORMIN (GLUCOPHAGE-XR) 750 MG 24 hr tablet TAKE 1 TABLET(750 MG TOTAL) BY MOUTH DAILY WITH BREAKFAST. 01/22/18   Emeterio Reeve, DO  Multiple Vitamins-Minerals (PRESERVISION AREDS 2 PO) Take 1 capsule by mouth 2 (two) times daily.    [provider]  mupirocin ointment (BACTROBAN) 2 % Apply 1 application topically 3 (three) times daily. 02/24/18   Kandra Nicolas, MD  nitroGLYCERIN (NITROSTAT) 0.4 MG SL tablet Place 1 tablet (0.4 mg total) under the tongue every 5 (five) minutes as needed for chest pain. 01/01/18 04/01/18  Revankar, Reita Cliche, MD  ondansetron (ZOFRAN) 8 MG tablet TAKE 1 TABLET BY MOUTH EVERY 8 HOURS AS NEEDED FOR NAUSEA AND VOMITING 04/06/16   Breeback, Jade L, PA-C  promethazine (PHENERGAN) 25 MG tablet Take 1 tablet (25 mg total) by mouth every 6 (six) hours as needed for nausea or vomiting. 04/19/16   Breeback, Jade L, PA-C  QUEtiapine (SEROQUEL) 200 MG tablet Take 1 tablet (200 mg total) by mouth at bedtime. 09/05/17   Emeterio Reeve, DO  sertraline (ZOLOFT) 100 MG tablet Take 2 tablets (200 mg total) by mouth daily. 11/27/17   Emeterio Reeve, DO  simvastatin (ZOCOR) 40 MG tablet TAKE 1 TABLET BY MOUTH EVERY NIGHT AT BEDTIME 01/26/18   Emeterio Reeve, DO  SURE COMFORT PEN NEEDLES 32G X 4 MM MISC USE AS DIRECTED 04/26/17   Emeterio Reeve, DO  triamcinolone cream (KENALOG) 0.1 % Apply 1 application topically 2 (two) times daily. For itchy bumps. 11/23/15   Donella Stade, PA-C    Family History Family History  Problem  Relation Age of Onset  . Anxiety disorder Mother   . Depression Mother   . Heart attack Mother   . Stroke Mother   . Diabetes type II Mother   . Diabetes Father   . Glaucoma Father   . Hypertension Father   . COPD Father   .  COPD Sister   . Hashimoto's thyroiditis Sister   . Cancer Sister   . Kidney disease Sister   . Drug abuse Brother   . Alcohol abuse Brother   . Asthma Son   . Diabetes Son     Social History Social History   Tobacco Use  . Smoking status: Current Some Day Smoker    Packs/day: 0.25    Years: 51.00    Pack years: 12.75    Types: Cigarettes    Start date: 01/02/2012  . Smokeless tobacco: Never Used  . Tobacco comment: Been down to 1  Substance Use Topics  . Alcohol use: No  . Drug use: No     Allergies   Bee venom; Iodinated diagnostic agents; Penicillins; Povidone iodine; Tomato; Chocolate; Latex; Other; Atorvastatin calcium; Esomeprazole magnesium; Gluten meal; Metformin and related; Quetiapine fumarate; Trazodone and nefazodone; and Zolpidem tartrate   Review of Systems Review of Systems  Constitutional: Positive for chills and diaphoresis. Negative for fever.  Musculoskeletal: Positive for stiffness.  All other systems reviewed and are negative.    Physical Exam Triage Vital Signs ED Triage Vitals  Enc Vitals Group     BP 02/24/18 1726 114/70     Pulse Rate 02/24/18 1726 (!) 102     Resp --      Temp 02/24/18 1726 99 F (37.2 C)     Temp Source 02/24/18 1726 Oral     SpO2 02/24/18 1726 95 %     Weight 02/24/18 1727 206 lb (93.4 kg)     Height 02/24/18 1727 5' 3"  (1.6 m)     Head Circumference --      Peak Flow --      Pain Score 02/24/18 1726 7     Pain Loc --      Pain Edu? --      Excl. in [place]? --    No data found.  Updated Vital Signs BP 114/70 (BP Location: Right Arm)   Pulse (!) 102   Temp 99 F (37.2 C) (Oral)   Ht 5' 3"  (1.6 m)   Wt 93.4 kg   LMP 09/21/1974   SpO2 95%   BMI 36.49 kg/m   Visual Acuity Right  Eye Distance:   Left Eye Distance:   Bilateral Distance:    Right Eye Near:   Left Eye Near:    Bilateral Near:     Physical Exam  Constitutional: She appears well-developed and well-nourished. No distress.  HENT:  Head: Atraumatic.  Eyes: Pupils are equal, round, and reactive to light.  Cardiovascular:  Rate 102  Pulmonary/Chest: Effort normal.  Musculoskeletal: She exhibits no edema.       Left knee: She exhibits erythema. She exhibits normal range of motion and no effusion.       Legs: Left knee:  No effusion.  Knee stable, negative drawer test.  There is erythema and tenderness to palpation over the pre-patellar bursa where there is a persistent central eschar present.  No drainage noted.  The anterior aspect of lower left leg over the pre-tibial area has resolving ecchymosis and mild tenderness to palpation over the tibia.  No calf tenderness to palpation.  Neurological: She is alert.  Skin: Skin is warm and dry.  Nursing note and vitals reviewed.    UC Treatments / Results  Labs (all labs ordered are listed, but only abnormal results are displayed) Labs Reviewed  WOUND CULTURE  POCT CBC W AUTO DIFF ([place]):  WBC  9.7; LY 35.5; MO 3.4; GR 61.1; Hgb 14.3; Platelets 296     EKG None  Radiology No results found.  Procedures Procedures (including critical care time)  Medications Ordered in UC Medications - No data to display  Initial Impression / Assessment and Plan / UC Course  I have reviewed the triage vital signs and the nursing notes.  Pertinent labs & imaging results that were available during my care of the patient were reviewed by me and considered in my medical decision making (see chart for details).    Normal WBC reassuring. Wound culture pending.  Begin clindamycin, and topical mupirocin. Tenderness and resolving ecchymosis left pre-tibial area most likely a result of downward migration of blood from resolving knee hematoma. Followup  with Dr. Georgina Snell as scheduled.   Final Clinical Impressions(s) / UC Diagnoses   Final diagnoses:  Cellulitis of left knee     Discharge Instructions     Stop doxycycline.  Change bandage on knee wound with each application of mupirocin ointment (3 times daily). Apply heating pad to left lower leg 2 or 3 times daily.    ED Prescriptions    Medication Sig Dispense Auth. Provider   clindamycin (CLEOCIN) 300 MG capsule Take one cap by mouth every 8 hours. 30 capsule Kandra Nicolas, MD   mupirocin ointment (BACTROBAN) 2 % Apply 1 application topically 3 (three) times daily. 30 g Kandra Nicolas, MD        Kandra Nicolas, MD 02/27/18 [phone]

## 2018-02-26 ENCOUNTER — Other Ambulatory Visit: Payer: Self-pay | Admitting: Family Medicine

## 2018-02-26 ENCOUNTER — Telehealth: Payer: Self-pay

## 2018-02-26 NOTE — Telephone Encounter (Signed)
Pt has been updated & aware of provider's note regarding thyroid check. No other inquiries during call.

## 2018-02-26 NOTE — Telephone Encounter (Signed)
I did go ahead and refill the 150 mcg for 30 tabs because she needs to go and have her blood work drawn before we refill another 90-day supply to make sure that it does not need to be adjusted again.  She was actually supposed to have it rechecked in August so she needs to get down there in the next couple weeks if at all possible.

## 2018-02-26 NOTE — Telephone Encounter (Signed)
[facility] pharmacy requesting med RF for levothyroxine 137 mcg & 150 mcg. As per provider's lab results note on 11/27/2017  - pt was to discontinue taking 137 mcg due to low thyroid. Current dose is 150 mcg. Cannot send med refill to pharmacy. Rx written by historical provider. Thanks.

## 2018-02-26 NOTE — Telephone Encounter (Signed)
Pts knee feels somewhat improved. Was not able to get ointment last night, it is ready now at the pharmacy. Advised pt to continue antibiotics and start ointment today. Follow up with PCP if not improved by end of week.

## 2018-02-27 ENCOUNTER — Ambulatory Visit (INDEPENDENT_AMBULATORY_CARE_PROVIDER_SITE_OTHER): Payer: Medicare HMO | Admitting: Family Medicine

## 2018-02-27 ENCOUNTER — Encounter: Payer: Self-pay | Admitting: Family Medicine

## 2018-02-27 VITALS — BP 118/68 | HR 99 | Wt 204.0 lb

## 2018-02-27 DIAGNOSIS — M7042 Prepatellar bursitis, left knee: Secondary | ICD-10-CM | POA: Diagnosis not present

## 2018-02-27 DIAGNOSIS — L03116 Cellulitis of left lower limb: Secondary | ICD-10-CM

## 2018-02-27 NOTE — Progress Notes (Signed)
Autumn Bowen is a 72 y.o. female who presents to [facility]: [place] today for follow up cellulitis.   She was seen last week for prepatellar bursitis and overlying cellulitis following a fall. She was originally given doxycycline but was seen again in urgent care and her doxycycline was stopped and started on clindamycin. She is doing much better now. She reports significantly decreased swelling and pain. She still does have some pain when she is up and walking for extended periods. Her culture from her swab in urgent care is no growth at 3 days. She has been dressing it and putting antibiotic ointment on it in addition to taking the clindamycin.    ROS as above:  Exam:  BP 118/68   Pulse 99   Wt 204 lb (92.5 kg)   LMP 09/21/1974   BMI 36.14 kg/m  Wt Readings from Last 5 Encounters:  02/27/18 204 lb (92.5 kg)  02/24/18 206 lb (93.4 kg)  02/21/18 211 lb (95.7 kg)  01/09/18 212 lb 11.2 oz (96.5 kg)  01/04/18 210 lb (95.3 kg)    Gen: Well NAD HEENT: EOMI,  MMM Lungs: Normal work of breathing. CTABL Heart: RRR no MRG Abd: NABS, Soft. Nondistended, Nontender Exts: Brisk capillary refill, warm and well perfused.  Left knee: 1 cm scabbed lesion on knee with 1 cm border of surrounding erythema and swelling.  Patient has a 1 cm area of soft mobile patella bursitis deep to the abrasion.  This overlies the medial aspect of the inferior patella.   Lab and Radiology Results Results for orders placed or performed during the hospital encounter of 02/24/18 (from the past 72 hour(s))  POCT CBC w auto diff     Status: None   Collection Time: 02/24/18  6:25 PM  Result Value Ref Range   WBC      Comment: see scanned report   Lymphocytes relative %     Monocytes relative %     Neutrophils relative % (GR)     Lymphocytes absolute     Monocyes absolute     Neutrophils absolute  (GR#)     RBC     Hemoglobin     Hematocrit     MCV     MCH     MCHC     RDW     Platelet count     MPV    WOUND CULTURE     Status: None (Preliminary result)   Collection Time: 02/24/18  6:26 PM  Result Value Ref Range   MICRO NUMBER: [id number]    SPECIMEN QUALITY: ADEQUATE    SOURCE: NOT GIVEN    STATUS: PRELIMINARY    GRAM STAIN:      No white blood cells seen Rare epithelial cells No organisms seen   RESULT: No Growth    No results found.    Assessment and Plan: 72 y.o. female with cellulitis on her left knee. She is improving significantly on clindamycin but she is still having some pain and swelling. The culture is no growth to date at 3 days. The plan will be for her to finish her 10 days of clindamycin and come back to clinic to follow up. She was advised that it might take a few weeks to completely heal. In the meantime she should continue to use the abx ointment and keep it covered with a dressing.    Recheck in 1 week.  At that  point if the prepatellar bursitis is still present may consider aspiration however would like to avoid aspiration if possible.  No orders of the defined types were placed in this encounter.  No orders of the defined types were placed in this encounter.    Historical information moved to improve visibility of documentation.  Past Medical History:  Diagnosis Date  . Allergy    Notes that grass and some trees causes eyes to sting, burn, and water.  . Anemia   . Celiac disease   . Celiac disease   . COPD (chronic obstructive pulmonary disease) ([place])   . Diabetes mellitus type II   . Diverticulitis   . Dyslipidemia   . HOH (hard of hearing)   . Hypertension   . Hypothyroidism   . Sleep apnea   . Sore throat    Past Surgical History:  Procedure Laterality Date  . APPENDECTOMY    . BREAST SURGERY     h/o benign cystleft breast and lymp node removal on right breast area.  Marland Kitchen EYE SURGERY     Left eye cataract removal, right eye h/o  macular degeneration.  . INTRAVASCULAR PRESSURE WIRE/FFR STUDY N/A 12/07/2017   Procedure: INTRAVASCULAR PRESSURE WIRE/FFR STUDY;  Surgeon: Leonie Man, MD;  Location: [facility];  Service: Cardiovascular;  Laterality: N/A;  . LEFT HEART CATH AND CORONARY ANGIOGRAPHY N/A 12/07/2017   Procedure: LEFT HEART CATH AND CORONARY ANGIOGRAPHY;  Surgeon: Leonie Man, MD;  Location: [facility];  Service: Cardiovascular;  Laterality: N/A;  . SPINE SURGERY     Pt not sure of the type of surgery she had but knows it was L4-5.  Marland Kitchen VAGINAL HYSTERECTOMY     Social History   Tobacco Use  . Smoking status: Current Some Day Smoker    Packs/day: 0.25    Years: 51.00    Pack years: 12.75    Types: Cigarettes    Start date: 01/02/2012  . Smokeless tobacco: Never Used  . Tobacco comment: Been down to 1  Substance Use Topics  . Alcohol use: No   family history includes Alcohol abuse in her brother; Anxiety disorder in her mother; Asthma in her son; COPD in her father and sister; Cancer in her sister; Depression in her mother; Diabetes in her father and son; Diabetes type II in her mother; Drug abuse in her brother; Glaucoma in her father; Hashimoto's thyroiditis in her sister; Heart attack in her mother; Hypertension in her father; Kidney disease in her sister; Stroke in her mother.  Medications: Current Outpatient Medications  Medication Sig Dispense Refill  . ACCU-CHEK AVIVA PLUS test strip test 1-2 TIMES daily 100 each 11  . acetaminophen (TYLENOL) 500 MG tablet Take 1,000 mg by mouth every 6 (six) hours as needed for mild pain or moderate pain.    Marland Kitchen albuterol (PROVENTIL HFA;VENTOLIN HFA) 108 (90 Base) MCG/ACT inhaler Inhale 2 puffs into the lungs every 6 (six) hours as needed for wheezing or shortness of breath. 2 Inhaler 5  . AMBULATORY NON FORMULARY MEDICATION Glucometer, test strips and lancets for testing once a day for DM, uncontrolled. 100 strip 0  . amitriptyline (ELAVIL) 50  MG tablet TAKE 1 TABLET BY MOUTH EVERY NIGHT AT BEDTIME 90 tablet 0  . aspirin EC 81 MG tablet Take 1 tablet (81 mg total) by mouth daily. 90 tablet 3  . cetirizine (ZYRTEC) 10 MG tablet Take 10 mg by mouth at bedtime.    . CHANTIX 1  MG tablet TAKE 1 TABLET BY MOUTH TWICE DAILY( EVERY TWELVE HOURS) 56 tablet 0  . cholecalciferol (VITAMIN D) 1000 units tablet Take 1,000 Units by mouth daily.    . clindamycin (CLEOCIN) 300 MG capsule Take one cap by mouth every 8 hours. 30 capsule 0  . colchicine 0.6 MG tablet Take 1-2 tablets (0.6-1.2 mg total) by mouth daily. 60 tablet 3  . diazepam (VALIUM) 5 MG tablet Take 1 tab PO 1 hour before procedure or imaging. 5 tablet 0  . dicyclomine (BENTYL) 10 MG capsule Take 1 capsule (10 mg total) 2 (two) times daily by mouth. 180 capsule 3  . doxycycline (VIBRA-TABS) 100 MG tablet Take 1 tablet (100 mg total) by mouth 2 (two) times daily. 14 tablet 0  . empagliflozin (JARDIANCE) 10 MG TABS tablet Take 10 mg by mouth daily. 90 tablet 1  . EPIPEN 2-PAK 0.3 MG/0.3ML SOAJ injection USE AS DIRECTED 1 Device 1  . ezetimibe (ZETIA) 10 MG tablet TAKE 1 TABLET BY MOUTH EVERY DAY 90 tablet 0  . famotidine (PEPCID) 20 MG tablet Take 20 mg by mouth daily.    . Fluticasone-Salmeterol (ADVAIR) 250-50 MCG/DOSE AEPB Inhale 1 puff into the lungs every 12 (twelve) hours. 60 each 11  . GLUCOSAMINE-CHONDROITIN PO Take 1 tablet by mouth 2 (two) times daily.     . Insulin Degludec (TRESIBA FLEXTOUCH) 200 UNIT/ML SOPN Inject 20 Units into the skin at bedtime. Increase by 2 units every 5 days until fasting glucose 90-120.    Marland Kitchen isosorbide mononitrate (IMDUR) 30 MG 24 hr tablet Take 1 tablet (30 mg total) by mouth daily. 30 tablet 3  . levocetirizine (XYZAL) 5 MG tablet Take 5 mg by mouth every evening.    Marland Kitchen levothyroxine (SYNTHROID, LEVOTHROID) 150 MCG tablet TAKE 1 TABLET BY MOUTH DAILY BEFORE BREAKFAST 90 tablet 0  . losartan (COZAAR) 25 MG tablet Take 1 tablet (25 mg total) by mouth  daily. 30 tablet 5  . LUMIGAN 0.01 % SOLN Instill 1 drop into both eyes every night  3  . LYRICA 50 MG capsule TAKE ONE CAPSULE TWICE DAILY BY MOUTH 180 capsule 1  . metFORMIN (GLUCOPHAGE-XR) 750 MG 24 hr tablet TAKE 1 TABLET(750 MG TOTAL) BY MOUTH DAILY WITH BREAKFAST. 90 tablet 0  . Multiple Vitamins-Minerals (PRESERVISION AREDS 2 PO) Take 1 capsule by mouth 2 (two) times daily.    . mupirocin ointment (BACTROBAN) 2 % Apply 1 application topically 3 (three) times daily. 30 g 0  . nitroGLYCERIN (NITROSTAT) 0.4 MG SL tablet Place 1 tablet (0.4 mg total) under the tongue every 5 (five) minutes as needed for chest pain. 25 tablet 11  . ondansetron (ZOFRAN) 8 MG tablet TAKE 1 TABLET BY MOUTH EVERY 8 HOURS AS NEEDED FOR NAUSEA AND VOMITING 20 tablet 2  . QUEtiapine (SEROQUEL) 200 MG tablet Take 1 tablet (200 mg total) by mouth at bedtime. 90 tablet 1  . sertraline (ZOLOFT) 100 MG tablet Take 2 tablets (200 mg total) by mouth daily. 120 tablet 1  . simvastatin (ZOCOR) 40 MG tablet TAKE 1 TABLET BY MOUTH EVERY NIGHT AT BEDTIME 90 tablet 0  . SURE COMFORT PEN NEEDLES 32G X 4 MM MISC USE AS DIRECTED 100 each prn  . triamcinolone cream (KENALOG) 0.1 % Apply 1 application topically 2 (two) times daily. For itchy bumps. 60 g 1   No current facility-administered medications for this visit.    Allergies  Allergen Reactions  . Bee Venom Anaphylaxis  .  Iodinated Diagnostic Agents Other (See Comments)    Per patient cardiac arrest IVP DYE-CARDIAC ARREST  . Penicillins Anaphylaxis    Has patient had a PCN reaction causing immediate rash, facial/tongue/throat swelling, SOB or lightheadedness with hypotension: yes Has patient had a PCN reaction causing severe rash involving mucus membranes or skin necrosis: no Has patient had a PCN reaction that required hospitalization: yes Has patient had a PCN reaction occurring within the last 10 years: no If all of the above answers are "NO", then may proceed with  Cephalosporin use.   . Povidone Iodine Itching and Rash  . Tomato Itching  . Chocolate Rash  . Latex Rash  . Other Rash  . Atorvastatin Calcium Nausea Only  . Esomeprazole Magnesium Nausea Only  . Gluten Meal   . Metformin And Related Diarrhea  . Quetiapine Fumarate     Per pt she can not take XR  . Trazodone And Nefazodone Hives  . Zolpidem Tartrate Nausea And Vomiting     Discussed warning signs or symptoms. Please see discharge instructions. Patient expresses understanding.  I personally was present and performed or re-performed the history, physical exam and medical decision-making activities of this service and have verified that the service and findings are accurately documented in the student's note. ___________________________________________ Lynne Leader M.D., ABFM., CAQSM. Primary Care and Sports Medicine Adjunct Instructor of [employer] [facility] [employer]

## 2018-02-27 NOTE — Patient Instructions (Addendum)
Thank you for coming in today. Keep the wound covered with the ointment.  Finish out the clindamycin oral antibiotic.  Recheck in 1 week.  Return sooner if needed.    Prepatellar Bursitis Prepatellar bursitis is inflammation of the prepatellar bursa. The prepatellar bursa is a fluid-filled sac that cushions the kneecap (patella). Prepatellar bursitis happens when fluid builds up in the this sac and causes it to get larger. The condition causes knee pain. What are the causes? This condition may be caused by:  Constant pressure on the knees from kneeling.  A hit to the knee.  Falling on the knee.  A bacterial infection.  Moving the knee often in a forceful way.  What increases the risk? This condition is more likely to develop in:  People who play a sport that involves a risk for falls on the knee or hard hits (blows) to the knee. These sports include: ? Football. ? Wrestling. ? Basketball. ? Soccer.  People who have to kneel for long periods of time, such as roofers, plumbers, and gardeners.  People with another inflammatory condition, such as gout or rheumatoid arthritis.  What are the signs or symptoms? The most common symptom of this condition is knee pain that gets better with rest. Other symptoms include:  Swelling on the front of the kneecap.  Warmth in the knee.  Tenderness with activity.  Redness in the knee.  Inability to bend the knee or to kneel.  How is this diagnosed? This condition may be diagnosed based on:  Your symptoms.  Your medical history.  A physical exam. During the exam, your provider will compare your knees and check for tenderness and pain when moving your knee. Your health care provider may also use a needle to remove fluid from the bursa to help diagnose an infection.  Tests, such as: ? A blood test that checks for infection. ? X-rays. These may be taken to check the structure of the patella. ? MRI or ultrasound. These may be done  to check for swelling and fluid buildup in the bursa.  How is this treated? This condition may be treated by:  Resting the knee.  Applying ice to the knee.  Medicine, such as: ? Nonsteroidal anti-inflammatory drugs (NSAIDs). These can help to reduce pain and swelling. ? Antibiotic medicines. These may be needed if you have an infection. ? Steroid medicines. These may be prescribed if other treatments are not helping.  Raising (elevating) the knee while resting.  Doing strengthening and stretching exercises (physical therapy). These may be recommended after pain and swelling improve.  Having a procedure to remove fluid from the bursa. This may be done if other treatment is not helping.  Having surgery to remove the bursa. This may be done if you have a severe infection or if the condition keeps coming back after treatment.  Follow these instructions at home: Medicines  Take over-the-counter and prescription medicines only as told by your health care provider.  If you were prescribed an antibiotic medicine, take it as told by your health care provider. Do not stop taking the antibiotic even if you start to feel better. Managing pain, stiffness, and swelling  If directed, apply ice to your knee. ? Put ice in a plastic bag. ? Place a towel between your skin and the bag. ? Leave the ice on for 20 minutes, 2-3 times a day.  Elevate your knee above the level of your heart while you are sitting or lying down. Driving  Do not drive or operate heavy machinery while taking prescription pain medicine.  Ask your health care provider when it is safe fpr you to drive. Activity  Rest your knee.  Avoid activities that cause pain.  Return to your normal activities as told by your health care provider. Ask your health care provider what activities are safe for you.  Do exercises as told by your health care provider. General instructions  Do not use the injured limb to support your  body weight until your health care provider says that you can.  Do not use any tobacco products, such as cigarettes, chewing tobacco, and e-cigarettes. Tobacco can delay bone healing. If you need help quitting, ask your health care provider.  Keep all follow-up visits as told by your health care provider. This is important. How is this prevented?  Warm up and stretch before being active.  Cool down and stretch after being active.  Give your body time to rest between periods of activity.  Make sure to use equipment that fits you.  Be safe and responsible while being active to avoid falls.  Do at least 150 minutes of moderate-intensity exercise each week, such as brisk walking or water aerobics.  Maintain physical fitness, including: ? Strength. ? Flexibility. ? Cardiovascular fitness. ? Endurance. Contact a health care provider if:  Your symptoms do not improve.  Your symptoms get worse.  Your symptoms keep coming back after treatment.  You develop a fever and have warmth, redness, and swelling over your knee. This information is not intended to replace advice given to you by your health care provider. Make sure you discuss any questions you have with your health care provider. Document Released: 05/16/2005 Document Revised: 01/19/2016 Document Reviewed: 02/13/2015 Elsevier Interactive Patient Education  [employer].

## 2018-02-28 ENCOUNTER — Telehealth: Payer: Self-pay | Admitting: Emergency Medicine

## 2018-02-28 LAB — WOUND CULTURE
MICRO NUMBER:: 91170045
RESULT:: NO GROWTH
SPECIMEN QUALITY:: ADEQUATE

## 2018-02-28 NOTE — Telephone Encounter (Signed)
Unable to leave message, no answer

## 2018-02-28 NOTE — Telephone Encounter (Signed)
Pt called back and [facility] results given. Charna Archer, LPN

## 2018-03-02 ENCOUNTER — Other Ambulatory Visit: Payer: Self-pay | Admitting: Osteopathic Medicine

## 2018-03-02 NOTE — Telephone Encounter (Signed)
Please review for refill- patient at [facility] 

## 2018-03-06 ENCOUNTER — Ambulatory Visit (INDEPENDENT_AMBULATORY_CARE_PROVIDER_SITE_OTHER): Payer: Medicare HMO | Admitting: Family Medicine

## 2018-03-06 ENCOUNTER — Encounter: Payer: Self-pay | Admitting: Family Medicine

## 2018-03-06 VITALS — BP 109/56 | HR 92 | Wt 209.0 lb

## 2018-03-06 DIAGNOSIS — M7042 Prepatellar bursitis, left knee: Secondary | ICD-10-CM | POA: Diagnosis not present

## 2018-03-06 NOTE — Patient Instructions (Signed)
Thank you for coming in today. Ok to STOP the clindamycin antibiotic.  Try to keep the swelling compression.  Recheck in 2 weeks if the swelling is still there.  Return sooner if needed.  If we are doing the fluid aspiration we can apply the gel ahead of time.    Prepatellar Bursitis Prepatellar bursitis is inflammation of the prepatellar bursa. The prepatellar bursa is a fluid-filled sac that cushions the kneecap (patella). Prepatellar bursitis happens when fluid builds up in the this sac and causes it to get larger. The condition causes knee pain. What are the causes? This condition may be caused by:  Constant pressure on the knees from kneeling.  A hit to the knee.  Falling on the knee.  A bacterial infection.  Moving the knee often in a forceful way.  What increases the risk? This condition is more likely to develop in:  People who play a sport that involves a risk for falls on the knee or hard hits (blows) to the knee. These sports include: ? Football. ? Wrestling. ? Basketball. ? Soccer.  People who have to kneel for long periods of time, such as roofers, plumbers, and gardeners.  People with another inflammatory condition, such as gout or rheumatoid arthritis.  What are the signs or symptoms? The most common symptom of this condition is knee pain that gets better with rest. Other symptoms include:  Swelling on the front of the kneecap.  Warmth in the knee.  Tenderness with activity.  Redness in the knee.  Inability to bend the knee or to kneel.  How is this diagnosed? This condition may be diagnosed based on:  Your symptoms.  Your medical history.  A physical exam. During the exam, your provider will compare your knees and check for tenderness and pain when moving your knee. Your health care provider may also use a needle to remove fluid from the bursa to help diagnose an infection.  Tests, such as: ? A blood test that checks for infection. ? X-rays.  These may be taken to check the structure of the patella. ? MRI or ultrasound. These may be done to check for swelling and fluid buildup in the bursa.  How is this treated? This condition may be treated by:  Resting the knee.  Applying ice to the knee.  Medicine, such as: ? Nonsteroidal anti-inflammatory drugs (NSAIDs). These can help to reduce pain and swelling. ? Antibiotic medicines. These may be needed if you have an infection. ? Steroid medicines. These may be prescribed if other treatments are not helping.  Raising (elevating) the knee while resting.  Doing strengthening and stretching exercises (physical therapy). These may be recommended after pain and swelling improve.  Having a procedure to remove fluid from the bursa. This may be done if other treatment is not helping.  Having surgery to remove the bursa. This may be done if you have a severe infection or if the condition keeps coming back after treatment.  Follow these instructions at home: Medicines  Take over-the-counter and prescription medicines only as told by your health care provider.  If you were prescribed an antibiotic medicine, take it as told by your health care provider. Do not stop taking the antibiotic even if you start to feel better. Managing pain, stiffness, and swelling  If directed, apply ice to your knee. ? Put ice in a plastic bag. ? Place a towel between your skin and the bag. ? Leave the ice on for 20 minutes, 2-3  times a day.  Elevate your knee above the level of your heart while you are sitting or lying down. Driving  Do not drive or operate heavy machinery while taking prescription pain medicine.  Ask your health care provider when it is safe fpr you to drive. Activity  Rest your knee.  Avoid activities that cause pain.  Return to your normal activities as told by your health care provider. Ask your health care provider what activities are safe for you.  Do exercises as told by  your health care provider. General instructions  Do not use the injured limb to support your body weight until your health care provider says that you can.  Do not use any tobacco products, such as cigarettes, chewing tobacco, and e-cigarettes. Tobacco can delay bone healing. If you need help quitting, ask your health care provider.  Keep all follow-up visits as told by your health care provider. This is important. How is this prevented?  Warm up and stretch before being active.  Cool down and stretch after being active.  Give your body time to rest between periods of activity.  Make sure to use equipment that fits you.  Be safe and responsible while being active to avoid falls.  Do at least 150 minutes of moderate-intensity exercise each week, such as brisk walking or water aerobics.  Maintain physical fitness, including: ? Strength. ? Flexibility. ? Cardiovascular fitness. ? Endurance. Contact a health care provider if:  Your symptoms do not improve.  Your symptoms get worse.  Your symptoms keep coming back after treatment.  You develop a fever and have warmth, redness, and swelling over your knee. This information is not intended to replace advice given to you by your health care provider. Make sure you discuss any questions you have with your health care provider. Document Released: 05/16/2005 Document Revised: 01/19/2016 Document Reviewed: 02/13/2015 Elsevier Interactive Patient Education  [employer].

## 2018-03-06 NOTE — Progress Notes (Signed)
KADYNCE BONDS is a 72 y.o. female who presents to [facility] today for prepatellar bursitis and overlying cellulitis.   Her cellulitis has significantly improved from last week. She has been taking Clindamycin, however notes that she had to switch from BID to TID because her dog has been having seizures and she needed to spend a lot of time with him and could not take TID. The infection is clearing however. She still has some tenderness to palpation along the anteromedial knee. She notes that she has a latex allergy and wonders if there may be a component of latex irritation form her bandages contributing to her redness and swelling. She is also using Bactroban ointment which she says has worked very well.     ROS:  As above  Exam:  BP (!) 109/56   Pulse 92   Wt 209 lb (94.8 kg)   LMP 09/21/1974   BMI 37.02 kg/m  General: Well Developed, well nourished, and in no acute distress.  Neuro/Psych: Alert and oriented x3, extra-ocular muscles intact, able to move all 4 extremities, sensation grossly intact. Skin: Warm and dry, no rashes noted.  Respiratory: Not using accessory muscles, speaking in full sentences, trachea midline.  Cardiovascular: Pulses palpable, no extremity edema. Abdomen: Does not appear distended. Left knee: swelling in anterior knee, inferomedial to the patella. Non erythematous, not warm. Tender to palpation at the anteromedial knee  ROM and strength normal    Lab and Radiology Results No results found for this or any previous visit (from the past 72 hour(s)). No results found.     Assessment and Plan: 72 y.o. female with prepatellar bursitis and overlying cellulitis. Her cellulitis has greatly improved on the clindamycin. She does still have a fluid collection in her knee. She can stop taking the clindamycin at this time. She should also start wrapping the knee in an ace wrap to keep compression on the fluid. We  will plan to recheck in 2 weeks and if the swelling if not improve we can aspirate the fluid.   I spent 15 minutes with this patient, greater than 50% was face-to-face time counseling regarding ddx and plan.    Historical information moved to improve visibility of documentation.  Past Medical History:  Diagnosis Date  . Allergy    Notes that grass and some trees causes eyes to sting, burn, and water.  . Anemia   . Celiac disease   . Celiac disease   . COPD (chronic obstructive pulmonary disease) ([place])   . Diabetes mellitus type II   . Diverticulitis   . Dyslipidemia   . HOH (hard of hearing)   . Hypertension   . Hypothyroidism   . Sleep apnea   . Sore throat    Past Surgical History:  Procedure Laterality Date  . APPENDECTOMY    . BREAST SURGERY     h/o benign cystleft breast and lymp node removal on right breast area.  Marland Kitchen EYE SURGERY     Left eye cataract removal, right eye h/o macular degeneration.  . INTRAVASCULAR PRESSURE WIRE/FFR STUDY N/A 12/07/2017   Procedure: INTRAVASCULAR PRESSURE WIRE/FFR STUDY;  Surgeon: Leonie Man, MD;  Location: [facility];  Service: Cardiovascular;  Laterality: N/A;  . LEFT HEART CATH AND CORONARY ANGIOGRAPHY N/A 12/07/2017   Procedure: LEFT HEART CATH AND CORONARY ANGIOGRAPHY;  Surgeon: Leonie Man, MD;  Location: [facility];  Service: Cardiovascular;  Laterality: N/A;  . SPINE  SURGERY     Pt not sure of the type of surgery she had but knows it was L4-5.  Marland Kitchen VAGINAL HYSTERECTOMY     Social History   Tobacco Use  . Smoking status: Current Some Day Smoker    Packs/day: 0.25    Years: 51.00    Pack years: 12.75    Types: Cigarettes    Start date: 01/02/2012  . Smokeless tobacco: Never Used  . Tobacco comment: Been down to 1  Substance Use Topics  . Alcohol use: No   family history includes Alcohol abuse in her brother; Anxiety disorder in her mother; Asthma in her son; COPD in her father and sister; Cancer in her  sister; Depression in her mother; Diabetes in her father and son; Diabetes type II in her mother; Drug abuse in her brother; Glaucoma in her father; Hashimoto's thyroiditis in her sister; Heart attack in her mother; Hypertension in her father; Kidney disease in her sister; Stroke in her mother.  Medications: Current Outpatient Medications  Medication Sig Dispense Refill  . ACCU-CHEK AVIVA PLUS test strip test 1-2 TIMES daily 100 each 11  . acetaminophen (TYLENOL) 500 MG tablet Take 1,000 mg by mouth every 6 (six) hours as needed for mild pain or moderate pain.    Marland Kitchen albuterol (PROVENTIL HFA;VENTOLIN HFA) 108 (90 Base) MCG/ACT inhaler Inhale 2 puffs into the lungs every 6 (six) hours as needed for wheezing or shortness of breath. 2 Inhaler 5  . AMBULATORY NON FORMULARY MEDICATION Glucometer, test strips and lancets for testing once a day for DM, uncontrolled. 100 strip 0  . amitriptyline (ELAVIL) 50 MG tablet TAKE 1 TABLET BY MOUTH EVERY NIGHT AT BEDTIME 90 tablet 0  . aspirin EC 81 MG tablet Take 1 tablet (81 mg total) by mouth daily. 90 tablet 3  . cetirizine (ZYRTEC) 10 MG tablet Take 10 mg by mouth at bedtime.    . CHANTIX 1 MG tablet TAKE 1 TABLET BY MOUTH TWICE DAILY( EVERY TWELVE HOURS) 56 tablet 0  . cholecalciferol (VITAMIN D) 1000 units tablet Take 1,000 Units by mouth daily.    . colchicine 0.6 MG tablet Take 1-2 tablets (0.6-1.2 mg total) by mouth daily. 60 tablet 3  . diazepam (VALIUM) 5 MG tablet Take 1 tab PO 1 hour before procedure or imaging. 5 tablet 0  . dicyclomine (BENTYL) 10 MG capsule Take 1 capsule (10 mg total) 2 (two) times daily by mouth. 180 capsule 3  . doxycycline (VIBRA-TABS) 100 MG tablet Take 1 tablet (100 mg total) by mouth 2 (two) times daily. 14 tablet 0  . empagliflozin (JARDIANCE) 10 MG TABS tablet Take 10 mg by mouth daily. 90 tablet 1  . EPIPEN 2-PAK 0.3 MG/0.3ML SOAJ injection USE AS DIRECTED 1 Device 1  . ezetimibe (ZETIA) 10 MG tablet TAKE 1 TABLET BY  MOUTH EVERY DAY 90 tablet 0  . famotidine (PEPCID) 20 MG tablet Take 20 mg by mouth daily.    . Fluticasone-Salmeterol (ADVAIR) 250-50 MCG/DOSE AEPB Inhale 1 puff into the lungs every 12 (twelve) hours. 60 each 11  . GLUCOSAMINE-CHONDROITIN PO Take 1 tablet by mouth 2 (two) times daily.     . Insulin Degludec (TRESIBA FLEXTOUCH) 200 UNIT/ML SOPN Inject 20 Units into the skin at bedtime. Increase by 2 units every 5 days until fasting glucose 90-120.    Marland Kitchen isosorbide mononitrate (IMDUR) 30 MG 24 hr tablet Take 1 tablet (30 mg total) by mouth daily. 30 tablet 3  . levocetirizine (  XYZAL) 5 MG tablet Take 5 mg by mouth every evening.    Marland Kitchen levothyroxine (SYNTHROID, LEVOTHROID) 150 MCG tablet TAKE 1 TABLET BY MOUTH DAILY BEFORE BREAKFAST 90 tablet 0  . losartan (COZAAR) 25 MG tablet Take 1 tablet (25 mg total) by mouth daily. 30 tablet 5  . LUMIGAN 0.01 % SOLN Instill 1 drop into both eyes every night  3  . LYRICA 50 MG capsule TAKE ONE CAPSULE TWICE DAILY BY MOUTH 180 capsule 1  . metFORMIN (GLUCOPHAGE-XR) 750 MG 24 hr tablet TAKE 1 TABLET(750 MG TOTAL) BY MOUTH DAILY WITH BREAKFAST. 90 tablet 0  . Multiple Vitamins-Minerals (PRESERVISION AREDS 2 PO) Take 1 capsule by mouth 2 (two) times daily.    . mupirocin ointment (BACTROBAN) 2 % Apply 1 application topically 3 (three) times daily. 30 g 0  . nitroGLYCERIN (NITROSTAT) 0.4 MG SL tablet Place 1 tablet (0.4 mg total) under the tongue every 5 (five) minutes as needed for chest pain. 25 tablet 11  . ondansetron (ZOFRAN) 8 MG tablet TAKE 1 TABLET BY MOUTH EVERY 8 HOURS AS NEEDED FOR NAUSEA AND VOMITING 20 tablet 2  . QUEtiapine (SEROQUEL) 200 MG tablet Take 1 tablet (200 mg total) by mouth at bedtime. 90 tablet 1  . sertraline (ZOLOFT) 100 MG tablet Take 2 tablets (200 mg total) by mouth daily. 120 tablet 1  . simvastatin (ZOCOR) 40 MG tablet TAKE 1 TABLET BY MOUTH EVERY NIGHT AT BEDTIME 90 tablet 0  . SURE COMFORT PEN NEEDLES 32G X 4 MM MISC USE AS  DIRECTED 100 each prn  . triamcinolone cream (KENALOG) 0.1 % Apply 1 application topically 2 (two) times daily. For itchy bumps. 60 g 1   No current facility-administered medications for this visit.    Allergies  Allergen Reactions  . Bee Venom Anaphylaxis  . Iodinated Diagnostic Agents Other (See Comments)    Per patient cardiac arrest IVP DYE-CARDIAC ARREST  . Penicillins Anaphylaxis    Has patient had a PCN reaction causing immediate rash, facial/tongue/throat swelling, SOB or lightheadedness with hypotension: yes Has patient had a PCN reaction causing severe rash involving mucus membranes or skin necrosis: no Has patient had a PCN reaction that required hospitalization: yes Has patient had a PCN reaction occurring within the last 10 years: no If all of the above answers are "NO", then may proceed with Cephalosporin use.   . Povidone Iodine Itching and Rash  . Tomato Itching  . Chocolate Rash  . Latex Rash  . Other Rash  . Atorvastatin Calcium Nausea Only  . Esomeprazole Magnesium Nausea Only  . Gluten Meal   . Metformin And Related Diarrhea  . Quetiapine Fumarate     Per pt she can not take XR  . Trazodone And Nefazodone Hives  . Zolpidem Tartrate Nausea And Vomiting      Discussed warning signs or symptoms. Please see discharge instructions. Patient expresses understanding.  I personally was present and performed or re-performed the history, physical exam and medical decision-making activities of this service and have verified that the service and findings are accurately documented in the student's note. ___________________________________________ Lynne Leader M.D., ABFM., CAQSM. Primary Care and Sports Medicine Adjunct Instructor of [employer]

## 2018-03-13 ENCOUNTER — Other Ambulatory Visit: Payer: Self-pay | Admitting: Osteopathic Medicine

## 2018-03-13 NOTE — Telephone Encounter (Signed)
[facility] pharmacy requesting med RF for famotidine (written by historical provider) and quetiapine. Thanks.

## 2018-03-14 NOTE — Progress Notes (Deleted)
HPI: FU CAD. Previously followed by Dr [name]. Carotid dopplers 2014 showed 40-59 right stenosis. Seen with CP 7/19. Cardiac cath 7/19 showed 60 RCA with FFR 0.94, hyperdynamic LV function. Since last seen,   Current Outpatient Medications  Medication Sig Dispense Refill  . ACCU-CHEK AVIVA PLUS test strip test 1-2 TIMES daily 100 each 11  . acetaminophen (TYLENOL) 500 MG tablet Take 1,000 mg by mouth every 6 (six) hours as needed for mild pain or moderate pain.    Marland Kitchen albuterol (PROVENTIL HFA;VENTOLIN HFA) 108 (90 Base) MCG/ACT inhaler Inhale 2 puffs into the lungs every 6 (six) hours as needed for wheezing or shortness of breath. 2 Inhaler 5  . AMBULATORY NON FORMULARY MEDICATION Glucometer, test strips and lancets for testing once a day for DM, uncontrolled. 100 strip 0  . amitriptyline (ELAVIL) 50 MG tablet TAKE 1 TABLET BY MOUTH EVERY NIGHT AT BEDTIME 90 tablet 0  . aspirin EC 81 MG tablet Take 1 tablet (81 mg total) by mouth daily. 90 tablet 3  . cetirizine (ZYRTEC) 10 MG tablet Take 10 mg by mouth at bedtime.    . CHANTIX 1 MG tablet TAKE 1 TABLET BY MOUTH TWICE DAILY( EVERY TWELVE HOURS) 56 tablet 0  . cholecalciferol (VITAMIN D) 1000 units tablet Take 1,000 Units by mouth daily.    . colchicine 0.6 MG tablet Take 1-2 tablets (0.6-1.2 mg total) by mouth daily. 60 tablet 3  . diazepam (VALIUM) 5 MG tablet Take 1 tab PO 1 hour before procedure or imaging. 5 tablet 0  . dicyclomine (BENTYL) 10 MG capsule Take 1 capsule (10 mg total) 2 (two) times daily by mouth. 180 capsule 3  . doxycycline (VIBRA-TABS) 100 MG tablet Take 1 tablet (100 mg total) by mouth 2 (two) times daily. 14 tablet 0  . empagliflozin (JARDIANCE) 10 MG TABS tablet Take 10 mg by mouth daily. 90 tablet 1  . EPIPEN 2-PAK 0.3 MG/0.3ML SOAJ injection USE AS DIRECTED 1 Device 1  . ezetimibe (ZETIA) 10 MG tablet TAKE 1 TABLET BY MOUTH EVERY DAY 90 tablet 0  . famotidine (PEPCID) 20 MG tablet Take 20 mg by mouth daily.      . Fluticasone-Salmeterol (ADVAIR) 250-50 MCG/DOSE AEPB Inhale 1 puff into the lungs every 12 (twelve) hours. 60 each 11  . GLUCOSAMINE-CHONDROITIN PO Take 1 tablet by mouth 2 (two) times daily.     . Insulin Degludec (TRESIBA FLEXTOUCH) 200 UNIT/ML SOPN Inject 20 Units into the skin at bedtime. Increase by 2 units every 5 days until fasting glucose 90-120.    Marland Kitchen isosorbide mononitrate (IMDUR) 30 MG 24 hr tablet Take 1 tablet (30 mg total) by mouth daily. 30 tablet 3  . levocetirizine (XYZAL) 5 MG tablet Take 5 mg by mouth every evening.    Marland Kitchen levothyroxine (SYNTHROID, LEVOTHROID) 150 MCG tablet TAKE 1 TABLET BY MOUTH DAILY BEFORE BREAKFAST 90 tablet 0  . losartan (COZAAR) 25 MG tablet Take 1 tablet (25 mg total) by mouth daily. 30 tablet 5  . LUMIGAN 0.01 % SOLN Instill 1 drop into both eyes every night  3  . LYRICA 50 MG capsule TAKE ONE CAPSULE TWICE DAILY BY MOUTH 180 capsule 1  . metFORMIN (GLUCOPHAGE-XR) 750 MG 24 hr tablet TAKE 1 TABLET(750 MG TOTAL) BY MOUTH DAILY WITH BREAKFAST. 90 tablet 0  . Multiple Vitamins-Minerals (PRESERVISION AREDS 2 PO) Take 1 capsule by mouth 2 (two) times daily.    . mupirocin ointment (BACTROBAN) 2 %  Apply 1 application topically 3 (three) times daily. 30 g 0  . nitroGLYCERIN (NITROSTAT) 0.4 MG SL tablet Place 1 tablet (0.4 mg total) under the tongue every 5 (five) minutes as needed for chest pain. 25 tablet 11  . ondansetron (ZOFRAN) 8 MG tablet TAKE 1 TABLET BY MOUTH EVERY 8 HOURS AS NEEDED FOR NAUSEA AND VOMITING 20 tablet 2  . QUEtiapine (SEROQUEL) 200 MG tablet Take 1 tablet (200 mg total) by mouth at bedtime. 90 tablet 1  . sertraline (ZOLOFT) 100 MG tablet Take 2 tablets (200 mg total) by mouth daily. 120 tablet 1  . simvastatin (ZOCOR) 40 MG tablet TAKE 1 TABLET BY MOUTH EVERY NIGHT AT BEDTIME 90 tablet 0  . SURE COMFORT PEN NEEDLES 32G X 4 MM MISC USE AS DIRECTED 100 each prn  . triamcinolone cream (KENALOG) 0.1 % Apply 1 application topically 2 (two)  times daily. For itchy bumps. 60 g 1   No current facility-administered medications for this visit.      Past Medical History:  Diagnosis Date  . Allergy    Notes that grass and some trees causes eyes to sting, burn, and water.  . Anemia   . Celiac disease   . Celiac disease   . COPD (chronic obstructive pulmonary disease) ([place])   . Diabetes mellitus type II   . Diverticulitis   . Dyslipidemia   . HOH (hard of hearing)   . Hypertension   . Hypothyroidism   . Sleep apnea   . Sore throat     Past Surgical History:  Procedure Laterality Date  . APPENDECTOMY    . BREAST SURGERY     h/o benign cystleft breast and lymp node removal on right breast area.  Marland Kitchen EYE SURGERY     Left eye cataract removal, right eye h/o macular degeneration.  . INTRAVASCULAR PRESSURE WIRE/FFR STUDY N/A 12/07/2017   Procedure: INTRAVASCULAR PRESSURE WIRE/FFR STUDY;  Surgeon: Leonie Man, MD;  Location: [facility];  Service: Cardiovascular;  Laterality: N/A;  . LEFT HEART CATH AND CORONARY ANGIOGRAPHY N/A 12/07/2017   Procedure: LEFT HEART CATH AND CORONARY ANGIOGRAPHY;  Surgeon: Leonie Man, MD;  Location: [facility];  Service: Cardiovascular;  Laterality: N/A;  . SPINE SURGERY     Pt not sure of the type of surgery she had but knows it was L4-5.  Marland Kitchen VAGINAL HYSTERECTOMY      Social History   Socioeconomic History  . Marital status: Divorced    Spouse name: Not on file  . Number of children: Not on file  . Years of education: Not on file  . Highest education level: Not on file  Occupational History  . Not on file  Social Needs  . Financial resource strain: Not on file  . Food insecurity:    Worry: Not on file    Inability: Not on file  . Transportation needs:    Medical: Not on file    Non-medical: Not on file  Tobacco Use  . Smoking status: Current Some Day Smoker    Packs/day: 0.25    Years: 51.00    Pack years: 12.75    Types: Cigarettes    Start date:  01/02/2012  . Smokeless tobacco: Never Used  . Tobacco comment: Been down to 1  Substance and Sexual Activity  . Alcohol use: No  . Drug use: No  . Sexual activity: Not Currently  Lifestyle  . Physical activity:    Days per  week: Not on file    Minutes per session: Not on file  . Stress: Not on file  Relationships  . Social connections:    Talks on phone: Not on file    Gets together: Not on file    Attends religious service: Not on file    Active member of club or organization: Not on file    Attends meetings of clubs or organizations: Not on file    Relationship status: Not on file  . Intimate partner violence:    Fear of current or ex partner: Not on file    Emotionally abused: Not on file    Physically abused: Not on file    Forced sexual activity: Not on file  Other Topics Concern  . Not on file  Social History Narrative  . Not on file    Family History  Problem Relation Age of Onset  . Anxiety disorder Mother   . Depression Mother   . Heart attack Mother   . Stroke Mother   . Diabetes type II Mother   . Diabetes Father   . Glaucoma Father   . Hypertension Father   . COPD Father   . COPD Sister   . Hashimoto's thyroiditis Sister   . Cancer Sister   . Kidney disease Sister   . Drug abuse Brother   . Alcohol abuse Brother   . Asthma Son   . Diabetes Son     ROS: no fevers or chills, productive cough, hemoptysis, dysphasia, odynophagia, melena, hematochezia, dysuria, hematuria, rash, seizure activity, orthopnea, PND, pedal edema, claudication. Remaining systems are negative.  Physical Exam: Well-developed well-nourished in no acute distress.  Skin is warm and dry.  HEENT is normal.  Neck is supple.  Chest is clear to auscultation with normal expansion.  Cardiovascular exam is regular rate and rhythm.  Abdominal exam nontender or distended. No masses palpated. Extremities show no edema. neuro grossly intact  ECG- personally reviewed  A/P  1  Kirk Ruths, MD

## 2018-03-15 NOTE — Telephone Encounter (Signed)
Pt has been updated. No other inquires during call.

## 2018-03-20 ENCOUNTER — Encounter: Payer: Self-pay | Admitting: Family Medicine

## 2018-03-20 ENCOUNTER — Ambulatory Visit (INDEPENDENT_AMBULATORY_CARE_PROVIDER_SITE_OTHER): Payer: Medicare HMO | Admitting: Family Medicine

## 2018-03-20 VITALS — BP 111/66 | HR 92 | Wt 208.0 lb

## 2018-03-20 DIAGNOSIS — M7042 Prepatellar bursitis, left knee: Secondary | ICD-10-CM | POA: Diagnosis not present

## 2018-03-20 MED ORDER — CLINDAMYCIN HCL 300 MG PO CAPS: 300.0000 mg | ORAL_CAPSULE | Freq: Two times a day (BID) | ORAL | 0 refills | Status: DC

## 2018-03-20 NOTE — Patient Instructions (Signed)
Thank you for coming in today. Take clindamycin twice daily for 1 week.  Keep the wound covered with ointment.  Recheck in 2 weeks.  Return sooner if needed.    Incision and Drainage Incision and drainage is a surgical procedure to open and drain a fluid-filled sac. The sac may be filled with pus, mucus, or blood. Examples of fluid-filled sacs that may need surgical drainage include cysts, skin infections (abscesses), and red lumps that develop from a ruptured cyst or a small abscess (boils). You may need this procedure if the affected area is large, painful, infected, or not healing well. Tell a health care provider about:  Any allergies you have.  All medicines you are taking, including vitamins, herbs, eye drops, creams, and over-the-counter medicines.  Any problems you or family members have had with anesthetic medicines.  Any blood disorders you have.  Any surgeries you have had.  Any medical conditions you have.  Whether you are pregnant or may be pregnant. What are the risks? Generally, this is a safe procedure. However, problems may occur, including:  Infection.  Bleeding.  Allergic reactions to medicines.  Scarring.  What happens before the procedure?  You may need an ultrasound or other imaging tests to see how large or deep the fluid-filled sac is.  You may have blood tests to check for infection.  You may get a tetanus shot.  You may be given antibiotic medicine to help prevent infection.  Follow instructions from your health care provider about eating or drinking restrictions.  Ask your health care provider about: ? Changing or stopping your regular medicines. This is especially important if you are taking diabetes medicines or blood thinners. ? Taking medicines such as aspirin and ibuprofen. These medicines can thin your blood. Do not take these medicines before your procedure if your health care provider instructs you not to.  Plan to have someone  take you home after the procedure.  If you will be going home right after the procedure, plan to have someone stay with you for 24 hours. What happens during the procedure?  To reduce your risk of infection: ? Your health care team will wash or sanitize their hands. ? Your skin will be washed with soap.  You will be given one or more of the following: ? A medicine to help you relax (sedative). ? A medicine to numb the area (local anesthetic). ? A medicine to make you fall asleep (general anesthetic).  An incision will be made in the top of the fluid-filled sac.  The contents of the sac may be squeezed out, or a syringe or tube (catheter)may be used to empty the sac.  The catheter may be left in place for several weeks to drain any fluid. Or, your health care provider may stitch open the edges of the incision to make a long-term opening for drainage (marsupialization).  The inside of the sac may be washed out (irrigated) with a sterile solution and packed with gauze before it is covered with a bandage (dressing). The procedure may vary among health care providers and hospitals. What happens after the procedure?  Your blood pressure, heart rate, breathing rate, and blood oxygen level will be monitored often until the medicines you were given have worn off.  Do not drive for 24 hours if you received a sedative. This information is not intended to replace advice given to you by your health care provider. Make sure you discuss any questions you have with your health  care provider. Document Released: 11/09/2000 Document Revised: 10/22/2015 Document Reviewed: 03/06/2015 Elsevier Interactive Patient Education  [employer].

## 2018-03-21 ENCOUNTER — Ambulatory Visit (INDEPENDENT_AMBULATORY_CARE_PROVIDER_SITE_OTHER): Payer: Medicare HMO | Admitting: Cardiology

## 2018-03-21 ENCOUNTER — Encounter: Payer: Self-pay | Admitting: Cardiology

## 2018-03-21 ENCOUNTER — Ambulatory Visit: Payer: Medicare HMO | Admitting: Cardiology

## 2018-03-21 VITALS — BP 101/60 | HR 84 | Ht 63.0 in | Wt 209.8 lb

## 2018-03-21 DIAGNOSIS — R072 Precordial pain: Secondary | ICD-10-CM

## 2018-03-21 DIAGNOSIS — I2510 Atherosclerotic heart disease of native coronary artery without angina pectoris: Secondary | ICD-10-CM

## 2018-03-21 DIAGNOSIS — E7800 Pure hypercholesterolemia, unspecified: Secondary | ICD-10-CM

## 2018-03-21 DIAGNOSIS — I10 Essential (primary) hypertension: Secondary | ICD-10-CM | POA: Diagnosis not present

## 2018-03-21 MED ORDER — ROSUVASTATIN CALCIUM 40 MG PO TABS: 40.0000 mg | ORAL_TABLET | Freq: Every day | ORAL | 3 refills | Status: DC

## 2018-03-21 NOTE — Progress Notes (Signed)
HPI: Follow-up coronary artery disease.  Previously followed by Dr. [name].  Carotid Doppler December 2014 showed 40 to 59% right stenosis.  Abdominal CT November 2017 showed atherosclerosis but no aneurysm.  Patient had cardiac catheterization July 2019 due to CP.  There was a 60% mid right coronary artery lesion with FFR of 0.94.  No other coronary disease noted.  Patient continues to have chest pain.  It is under her left breast and radiates to the right.  It is not pleuritic, positional or related to food.  Occurs both with exertion and at rest.  Lasts approximately 15 minutes and resolves with nitroglycerin.  She states she has had intermittent chest pain for 7 years.  She also has dyspnea on exertion.  No syncope.  Current Outpatient Medications  Medication Sig Dispense Refill  . ACCU-CHEK AVIVA PLUS test strip test 1-2 TIMES daily 100 each 11  . acetaminophen (TYLENOL) 500 MG tablet Take 1,000 mg by mouth every 6 (six) hours as needed for mild pain or moderate pain.    Marland Kitchen AMBULATORY NON FORMULARY MEDICATION Glucometer, test strips and lancets for testing once a day for DM, uncontrolled. 100 strip 0  . amitriptyline (ELAVIL) 50 MG tablet TAKE 1 TABLET BY MOUTH EVERY NIGHT AT BEDTIME 90 tablet 0  . aspirin EC 81 MG tablet Take 1 tablet (81 mg total) by mouth daily. 90 tablet 3  . cetirizine (ZYRTEC) 10 MG tablet Take 10 mg by mouth at bedtime.    . CHANTIX 1 MG tablet TAKE 1 TABLET BY MOUTH TWICE DAILY( EVERY TWELVE HOURS) 56 tablet 0  . cholecalciferol (VITAMIN D) 1000 units tablet Take 1,000 Units by mouth daily.    . clindamycin (CLEOCIN) 300 MG capsule Take 1 capsule (300 mg total) by mouth 2 (two) times daily. 14 capsule 0  . colchicine 0.6 MG tablet Take 1-2 tablets (0.6-1.2 mg total) by mouth daily. 60 tablet 3  . diazepam (VALIUM) 5 MG tablet Take 1 tab PO 1 hour before procedure or imaging. 5 tablet 0  . dicyclomine (BENTYL) 10 MG capsule Take 1 capsule (10 mg total) 2 (two)  times daily by mouth. 180 capsule 3  . doxycycline (VIBRA-TABS) 100 MG tablet Take 1 tablet (100 mg total) by mouth 2 (two) times daily. 14 tablet 0  . empagliflozin (JARDIANCE) 10 MG TABS tablet Take 10 mg by mouth daily. 90 tablet 1  . EPIPEN 2-PAK 0.3 MG/0.3ML SOAJ injection USE AS DIRECTED 1 Device 1  . ezetimibe (ZETIA) 10 MG tablet TAKE 1 TABLET BY MOUTH EVERY DAY 90 tablet 0  . famotidine (PEPCID) 20 MG tablet TAKE 1 TABLET(20 MG TOTAL) BY MOUTH DAILY. 90 tablet 0  . Fluticasone-Salmeterol (ADVAIR) 250-50 MCG/DOSE AEPB Inhale 1 puff into the lungs every 12 (twelve) hours. 60 each 11  . GLUCOSAMINE-CHONDROITIN PO Take 1 tablet by mouth 2 (two) times daily.     . Insulin Degludec (TRESIBA FLEXTOUCH) 200 UNIT/ML SOPN Inject 20 Units into the skin at bedtime. Increase by 2 units every 5 days until fasting glucose 90-120.    Marland Kitchen isosorbide mononitrate (IMDUR) 30 MG 24 hr tablet Take 1 tablet (30 mg total) by mouth daily. 30 tablet 3  . levocetirizine (XYZAL) 5 MG tablet Take 5 mg by mouth every evening.    Marland Kitchen levothyroxine (SYNTHROID, LEVOTHROID) 150 MCG tablet TAKE 1 TABLET BY MOUTH DAILY BEFORE BREAKFAST 90 tablet 0  . losartan (COZAAR) 25 MG tablet Take 1 tablet (25 mg  total) by mouth daily. 30 tablet 5  . LUMIGAN 0.01 % SOLN Instill 1 drop into both eyes every night  3  . LYRICA 50 MG capsule TAKE ONE CAPSULE TWICE DAILY BY MOUTH 180 capsule 1  . metFORMIN (GLUCOPHAGE-XR) 750 MG 24 hr tablet TAKE 1 TABLET(750 MG TOTAL) BY MOUTH DAILY WITH BREAKFAST. 90 tablet 0  . Multiple Vitamins-Minerals (PRESERVISION AREDS 2 PO) Take 1 capsule by mouth 2 (two) times daily.    . mupirocin ointment (BACTROBAN) 2 % Apply 1 application topically 3 (three) times daily. 30 g 0  . nitroGLYCERIN (NITROSTAT) 0.4 MG SL tablet Place 1 tablet (0.4 mg total) under the tongue every 5 (five) minutes as needed for chest pain. 25 tablet 11  . ondansetron (ZOFRAN) 8 MG tablet TAKE 1 TABLET BY MOUTH EVERY 8 HOURS AS NEEDED  FOR NAUSEA AND VOMITING 20 tablet 2  . QUEtiapine (SEROQUEL) 200 MG tablet TAKE 1 TABLET(200 MG TOTAL) BY MOUTH AT BEDTIME. 90 tablet 0  . sertraline (ZOLOFT) 100 MG tablet Take 2 tablets (200 mg total) by mouth daily. 120 tablet 1  . simvastatin (ZOCOR) 40 MG tablet TAKE 1 TABLET BY MOUTH EVERY NIGHT AT BEDTIME 90 tablet 0  . SURE COMFORT PEN NEEDLES 32G X 4 MM MISC USE AS DIRECTED 100 each prn  . triamcinolone cream (KENALOG) 0.1 % Apply 1 application topically 2 (two) times daily. For itchy bumps. 60 g 1   No current facility-administered medications for this visit.      Past Medical History:  Diagnosis Date  . Allergy    Notes that grass and some trees causes eyes to sting, burn, and water.  . Anemia   . Celiac disease   . Celiac disease   . COPD (chronic obstructive pulmonary disease) ([place])   . Diabetes mellitus type II   . Diverticulitis   . Dyslipidemia   . HOH (hard of hearing)   . Hypertension   . Hypothyroidism   . Sleep apnea   . Sore throat     Past Surgical History:  Procedure Laterality Date  . APPENDECTOMY    . BREAST SURGERY     h/o benign cystleft breast and lymp node removal on right breast area.  Marland Kitchen EYE SURGERY     Left eye cataract removal, right eye h/o macular degeneration.  . INTRAVASCULAR PRESSURE WIRE/FFR STUDY N/A 12/07/2017   Procedure: INTRAVASCULAR PRESSURE WIRE/FFR STUDY;  Surgeon: Leonie Man, MD;  Location: [facility];  Service: Cardiovascular;  Laterality: N/A;  . LEFT HEART CATH AND CORONARY ANGIOGRAPHY N/A 12/07/2017   Procedure: LEFT HEART CATH AND CORONARY ANGIOGRAPHY;  Surgeon: Leonie Man, MD;  Location: [facility];  Service: Cardiovascular;  Laterality: N/A;  . SPINE SURGERY     Pt not sure of the type of surgery she had but knows it was L4-5.  Marland Kitchen VAGINAL HYSTERECTOMY      Social History   Socioeconomic History  . Marital status: Divorced    Spouse name: Not on file  . Number of children: Not on file  .  Years of education: Not on file  . Highest education level: Not on file  Occupational History  . Not on file  Social Needs  . Financial resource strain: Not on file  . Food insecurity:    Worry: Not on file    Inability: Not on file  . Transportation needs:    Medical: Not on file    Non-medical: Not  on file  Tobacco Use  . Smoking status: Current Some Day Smoker    Packs/day: 0.25    Years: 51.00    Pack years: 12.75    Types: Cigarettes    Start date: 01/02/2012  . Smokeless tobacco: Never Used  . Tobacco comment: Been down to 1  Substance and Sexual Activity  . Alcohol use: No  . Drug use: No  . Sexual activity: Not Currently  Lifestyle  . Physical activity:    Days per week: Not on file    Minutes per session: Not on file  . Stress: Not on file  Relationships  . Social connections:    Talks on phone: Not on file    Gets together: Not on file    Attends religious service: Not on file    Active member of club or organization: Not on file    Attends meetings of clubs or organizations: Not on file    Relationship status: Not on file  . Intimate partner violence:    Fear of current or ex partner: Not on file    Emotionally abused: Not on file    Physically abused: Not on file    Forced sexual activity: Not on file  Other Topics Concern  . Not on file  Social History Narrative  . Not on file    Family History  Problem Relation Age of Onset  . Anxiety disorder Mother   . Depression Mother   . Heart attack Mother   . Stroke Mother   . Diabetes type II Mother   . Diabetes Father   . Glaucoma Father   . Hypertension Father   . COPD Father   . COPD Sister   . Hashimoto's thyroiditis Sister   . Cancer Sister   . Kidney disease Sister   . Drug abuse Brother   . Alcohol abuse Brother   . Asthma Son   . Diabetes Son     ROS: no fevers or chills, productive cough, hemoptysis, dysphasia, odynophagia, melena, hematochezia, dysuria, hematuria, rash, seizure  activity, orthopnea, PND, pedal edema, claudication. Remaining systems are negative.  Physical Exam: Well-developed obese in no acute distress.  Skin is warm and dry.  HEENT is normal.  Neck is supple.  Chest is clear to auscultation with normal expansion.  Cardiovascular exam is regular rate and rhythm.  Abdominal exam nontender or distended. No masses palpated. Extremities show no edema. neuro grossly intact  ECG-normal sinus rhythm, no ST changes.  Personally reviewed  A/P  1 coronary artery disease-recent catheterization revealed 60% right coronary artery but FFR negative.  Plan medical therapy.  Continue aspirin and statin.  Patient does describe chest pain but this has been intermittent for 7 years.  Symptoms are atypical.  Electrocardiogram shows no ST changes.  I spent 20 minutes reviewing previous records prior to patient visit.  2 hyperlipidemia-given documented coronary disease I would like to be more aggressive with cholesterol lowering therapy.  Discontinue Zocor and Zetia.  I will instead treat with Crestor 40 mg daily.  Check lipids and liver in 4 weeks.  3 hypertension-blood pressure controlled.  Continue present medications.  4 tobacco abuse-patient counseled on discontinuing.  5 diabetes mellitus-management per primary care.  Kirk Ruths, MD

## 2018-03-21 NOTE — Progress Notes (Signed)
Autumn Bowen is a 72 y.o. female who presents to [facility] today for follow-up left knee prepatellar bursitis.  Autumn Bowen has been seen several times for prepatellar bursitis of the left knee with possible cellulitis superficial.  She responded well to trial of clindamycin antibiotics but notes that the swelling never resolved and the redness and discomfort is starting to return.  She is here today for aspiration.  She feels well with no fevers chills nausea vomiting or diarrhea.    ROS:  As above  Exam:  BP 111/66   Pulse 92   Wt 208 lb (94.3 kg)   LMP 09/21/1974   BMI 36.85 kg/m  General: Well Developed, well nourished, and in no acute distress.  Neuro/Psych: Alert and oriented x3, extra-ocular muscles intact, able to move all 4 extremities, sensation grossly intact. Skin: Warm and dry, no rashes noted.  Respiratory: Not using accessory muscles, speaking in full sentences, trachea midline.  Cardiovascular: Pulses palpable, no extremity edema. Abdomen: Does not appear distended. MSK:  Left knee fluctuant mass at the anterior aspect of the knee overlying the proximal anterior tibia.  Mild skin erythema superficial with mild tenderness.  No induration or expressible pus or discharge.   Lab and Radiology Results Limited musculoskeletal ultrasound of the left anterior knee reveals a single hypoechoic mass superficial to the distal patellar tendon and proximal anterior tibia.  No bony involvement.  No blood flow within mass.   Left knee prepatellar bursitis incision and drainage: Consent obtained and timeout performed for aspiration. Skin cleaned with rubbing alcohol cold spray applied and 1 mL of lidocaine injected superficially overlying the central portion of the fluctuant mass achieving good anesthesia. The skin was again cleaned with rubbing alcohol and chlorhexidine.  18-gauge needle was used to access the mass however no fluid was able  to be aspirated. Skin resterilized with chlorhexidine and a sharp incision was made into the central portion of the mass.  The incision was widened slightly and dark red gelatinous material was expressed consistent appearance with old hematoma. Dressing applied patient tolerated the procedure well.   Assessment and Plan: 72 y.o. female with  Left knee pre-patella bursitis was more consistent with large hematoma.  Hematoma incised and drained as I believe it was becoming infected.  Culture obtained and pending.  Will proactively prescribe clindamycin as again as I believe she is starting to have return of cellulitis/infection.  Recheck in 2 weeks.  Return sooner if needed.    Orders Placed This Encounter  Procedures  . Wound culture    Order Specific Question:   Source    Answer:   left knee prepar hematoma   Meds ordered this encounter  Medications  . clindamycin (CLEOCIN) 300 MG capsule    Sig: Take 1 capsule (300 mg total) by mouth 2 (two) times daily.    Dispense:  14 capsule    Refill:  0    Historical information moved to improve visibility of documentation.  Past Medical History:  Diagnosis Date  . Allergy    Notes that grass and some trees causes eyes to sting, burn, and water.  . Anemia   . Celiac disease   . Celiac disease   . COPD (chronic obstructive pulmonary disease) ([place])   . Diabetes mellitus type II   . Diverticulitis   . Dyslipidemia   . HOH (hard of hearing)   . Hypertension   . Hypothyroidism   . Sleep apnea   .  Sore throat    Past Surgical History:  Procedure Laterality Date  . APPENDECTOMY    . BREAST SURGERY     h/o benign cystleft breast and lymp node removal on right breast area.  Marland Kitchen EYE SURGERY     Left eye cataract removal, right eye h/o macular degeneration.  . INTRAVASCULAR PRESSURE WIRE/FFR STUDY N/A 12/07/2017   Procedure: INTRAVASCULAR PRESSURE WIRE/FFR STUDY;  Surgeon: Leonie Man, MD;  Location: [facility];  Service:  Cardiovascular;  Laterality: N/A;  . LEFT HEART CATH AND CORONARY ANGIOGRAPHY N/A 12/07/2017   Procedure: LEFT HEART CATH AND CORONARY ANGIOGRAPHY;  Surgeon: Leonie Man, MD;  Location: [facility];  Service: Cardiovascular;  Laterality: N/A;  . SPINE SURGERY     Pt not sure of the type of surgery she had but knows it was L4-5.  Marland Kitchen VAGINAL HYSTERECTOMY     Social History   Tobacco Use  . Smoking status: Current Some Day Smoker    Packs/day: 0.25    Years: 51.00    Pack years: 12.75    Types: Cigarettes    Start date: 01/02/2012  . Smokeless tobacco: Never Used  . Tobacco comment: Been down to 1  Substance Use Topics  . Alcohol use: No   family history includes Alcohol abuse in her brother; Anxiety disorder in her mother; Asthma in her son; COPD in her father and sister; Cancer in her sister; Depression in her mother; Diabetes in her father and son; Diabetes type II in her mother; Drug abuse in her brother; Glaucoma in her father; Hashimoto's thyroiditis in her sister; Heart attack in her mother; Hypertension in her father; Kidney disease in her sister; Stroke in her mother.  Medications: Current Outpatient Medications  Medication Sig Dispense Refill  . ACCU-CHEK AVIVA PLUS test strip test 1-2 TIMES daily 100 each 11  . acetaminophen (TYLENOL) 500 MG tablet Take 1,000 mg by mouth every 6 (six) hours as needed for mild pain or moderate pain.    Marland Kitchen albuterol (PROVENTIL HFA;VENTOLIN HFA) 108 (90 Base) MCG/ACT inhaler Inhale 2 puffs into the lungs every 6 (six) hours as needed for wheezing or shortness of breath. 2 Inhaler 5  . AMBULATORY NON FORMULARY MEDICATION Glucometer, test strips and lancets for testing once a day for DM, uncontrolled. 100 strip 0  . amitriptyline (ELAVIL) 50 MG tablet TAKE 1 TABLET BY MOUTH EVERY NIGHT AT BEDTIME 90 tablet 0  . aspirin EC 81 MG tablet Take 1 tablet (81 mg total) by mouth daily. 90 tablet 3  . cetirizine (ZYRTEC) 10 MG tablet Take 10 mg by  mouth at bedtime.    . CHANTIX 1 MG tablet TAKE 1 TABLET BY MOUTH TWICE DAILY( EVERY TWELVE HOURS) 56 tablet 0  . cholecalciferol (VITAMIN D) 1000 units tablet Take 1,000 Units by mouth daily.    . colchicine 0.6 MG tablet Take 1-2 tablets (0.6-1.2 mg total) by mouth daily. 60 tablet 3  . diazepam (VALIUM) 5 MG tablet Take 1 tab PO 1 hour before procedure or imaging. 5 tablet 0  . dicyclomine (BENTYL) 10 MG capsule Take 1 capsule (10 mg total) 2 (two) times daily by mouth. 180 capsule 3  . doxycycline (VIBRA-TABS) 100 MG tablet Take 1 tablet (100 mg total) by mouth 2 (two) times daily. 14 tablet 0  . empagliflozin (JARDIANCE) 10 MG TABS tablet Take 10 mg by mouth daily. 90 tablet 1  . EPIPEN 2-PAK 0.3 MG/0.3ML SOAJ injection USE AS  DIRECTED 1 Device 1  . ezetimibe (ZETIA) 10 MG tablet TAKE 1 TABLET BY MOUTH EVERY DAY 90 tablet 0  . famotidine (PEPCID) 20 MG tablet TAKE 1 TABLET(20 MG TOTAL) BY MOUTH DAILY. 90 tablet 0  . Fluticasone-Salmeterol (ADVAIR) 250-50 MCG/DOSE AEPB Inhale 1 puff into the lungs every 12 (twelve) hours. 60 each 11  . GLUCOSAMINE-CHONDROITIN PO Take 1 tablet by mouth 2 (two) times daily.     . Insulin Degludec (TRESIBA FLEXTOUCH) 200 UNIT/ML SOPN Inject 20 Units into the skin at bedtime. Increase by 2 units every 5 days until fasting glucose 90-120.    Marland Kitchen isosorbide mononitrate (IMDUR) 30 MG 24 hr tablet Take 1 tablet (30 mg total) by mouth daily. 30 tablet 3  . levocetirizine (XYZAL) 5 MG tablet Take 5 mg by mouth every evening.    Marland Kitchen levothyroxine (SYNTHROID, LEVOTHROID) 150 MCG tablet TAKE 1 TABLET BY MOUTH DAILY BEFORE BREAKFAST 90 tablet 0  . losartan (COZAAR) 25 MG tablet Take 1 tablet (25 mg total) by mouth daily. 30 tablet 5  . LUMIGAN 0.01 % SOLN Instill 1 drop into both eyes every night  3  . LYRICA 50 MG capsule TAKE ONE CAPSULE TWICE DAILY BY MOUTH 180 capsule 1  . metFORMIN (GLUCOPHAGE-XR) 750 MG 24 hr tablet TAKE 1 TABLET(750 MG TOTAL) BY MOUTH DAILY WITH  BREAKFAST. 90 tablet 0  . Multiple Vitamins-Minerals (PRESERVISION AREDS 2 PO) Take 1 capsule by mouth 2 (two) times daily.    . mupirocin ointment (BACTROBAN) 2 % Apply 1 application topically 3 (three) times daily. 30 g 0  . nitroGLYCERIN (NITROSTAT) 0.4 MG SL tablet Place 1 tablet (0.4 mg total) under the tongue every 5 (five) minutes as needed for chest pain. 25 tablet 11  . ondansetron (ZOFRAN) 8 MG tablet TAKE 1 TABLET BY MOUTH EVERY 8 HOURS AS NEEDED FOR NAUSEA AND VOMITING 20 tablet 2  . QUEtiapine (SEROQUEL) 200 MG tablet TAKE 1 TABLET(200 MG TOTAL) BY MOUTH AT BEDTIME. 90 tablet 0  . sertraline (ZOLOFT) 100 MG tablet Take 2 tablets (200 mg total) by mouth daily. 120 tablet 1  . simvastatin (ZOCOR) 40 MG tablet TAKE 1 TABLET BY MOUTH EVERY NIGHT AT BEDTIME 90 tablet 0  . SURE COMFORT PEN NEEDLES 32G X 4 MM MISC USE AS DIRECTED 100 each prn  . triamcinolone cream (KENALOG) 0.1 % Apply 1 application topically 2 (two) times daily. For itchy bumps. 60 g 1  . clindamycin (CLEOCIN) 300 MG capsule Take 1 capsule (300 mg total) by mouth 2 (two) times daily. 14 capsule 0   No current facility-administered medications for this visit.    Allergies  Allergen Reactions  . Bee Venom Anaphylaxis  . Iodinated Diagnostic Agents Other (See Comments)    Per patient cardiac arrest IVP DYE-CARDIAC ARREST  . Penicillins Anaphylaxis    Has patient had a PCN reaction causing immediate rash, facial/tongue/throat swelling, SOB or lightheadedness with hypotension: yes Has patient had a PCN reaction causing severe rash involving mucus membranes or skin necrosis: no Has patient had a PCN reaction that required hospitalization: yes Has patient had a PCN reaction occurring within the last 10 years: no If all of the above answers are "NO", then may proceed with Cephalosporin use.   . Povidone Iodine Itching and Rash  . Tomato Itching  . Chocolate Rash  . Latex Rash  . Other Rash  . Atorvastatin Calcium  Nausea Only  . Esomeprazole Magnesium Nausea Only  . Gluten Meal   .  Metformin And Related Diarrhea  . Quetiapine Fumarate     Per pt she can not take XR  . Trazodone And Nefazodone Hives  . Zolpidem Tartrate Nausea And Vomiting      Discussed warning signs or symptoms. Please see discharge instructions. Patient expresses understanding.

## 2018-03-21 NOTE — Patient Instructions (Signed)
Medication Instructions:   STOP EZETIMIBE  STOP SIMVASTATIN  START ROSUVASTATIN 40 MG ONCE DAILY  Labwork:  Your physician recommends that you return for lab work in: [place]:  Your physician recommends that you schedule a follow-up appointment in: [place]

## 2018-03-22 ENCOUNTER — Other Ambulatory Visit: Payer: Self-pay

## 2018-03-22 DIAGNOSIS — E1169 Type 2 diabetes mellitus with other specified complication: Secondary | ICD-10-CM

## 2018-03-22 DIAGNOSIS — E785 Hyperlipidemia, unspecified: Secondary | ICD-10-CM

## 2018-03-22 DIAGNOSIS — I2510 Atherosclerotic heart disease of native coronary artery without angina pectoris: Secondary | ICD-10-CM

## 2018-03-22 LAB — HEPATIC FUNCTION PANEL
AG Ratio: 1.6 (calc) (ref 1.0–2.5)
ALT: 19 U/L (ref 6–29)
AST: 22 U/L (ref 10–35)
Albumin: 4.1 g/dL (ref 3.6–5.1)
Alkaline phosphatase (APISO): 45 U/L (ref 33–130)
Bilirubin, Direct: 0.1 mg/dL (ref 0.0–0.2)
Globulin: 2.5 g/dL (calc) (ref 1.9–3.7)
Indirect Bilirubin: 0.2 mg/dL (calc) (ref 0.2–1.2)
Total Bilirubin: 0.3 mg/dL (ref 0.2–1.2)
Total Protein: 6.6 g/dL (ref 6.1–8.1)

## 2018-03-22 LAB — LIPID PANEL
Cholesterol: 152 mg/dL (ref <200)
HDL: 56 mg/dL (ref >50)
LDL Cholesterol (Calc): 70 mg/dL (calc)
Non-HDL Cholesterol (Calc): 96 mg/dL (calc) (ref <130)
Total CHOL/HDL Ratio: 2.7 (calc) (ref <5.0)
Triglycerides: 189 mg/dL — ABNORMAL HIGH (ref <150)

## 2018-03-23 ENCOUNTER — Telehealth: Payer: Self-pay | Admitting: Cardiology

## 2018-03-23 LAB — WOUND CULTURE
MICRO NUMBER:: 91269069
RESULT:: NO GROWTH
SPECIMEN QUALITY:: ADEQUATE

## 2018-03-23 NOTE — Telephone Encounter (Signed)
New Message   Pt c/o medication issue:  1. Name of Medication: Not sure  2. How are you currently taking this medication (dosage and times per day)?   3. Are you having a reaction (difficulty breathing--STAT)?   4. What is your medication issue? Pt is calling, wanting to know what 2 medications Dr. Stanford Breed is wanting her to no longer take. Please call

## 2018-03-23 NOTE — Telephone Encounter (Signed)
LEFT DETAILED MESSAGE Medication Instructions:  STOP EZETIMIBE(ZETIA), STOP SIMVASTATIN START ROSUVASTATIN 40 MG ONCE DAILY Labwork: Your physician recommends that you return for lab work in: [place]

## 2018-03-24 ENCOUNTER — Other Ambulatory Visit: Payer: Self-pay | Admitting: Osteopathic Medicine

## 2018-03-26 ENCOUNTER — Other Ambulatory Visit: Payer: Self-pay | Admitting: Family Medicine

## 2018-03-26 NOTE — Telephone Encounter (Signed)
Spoke to patient , unable to speak at the moment , she will call back

## 2018-03-28 NOTE — Telephone Encounter (Signed)
Verified patient aware of medication changes.

## 2018-04-03 ENCOUNTER — Encounter: Payer: Self-pay | Admitting: Family Medicine

## 2018-04-03 ENCOUNTER — Ambulatory Visit (INDEPENDENT_AMBULATORY_CARE_PROVIDER_SITE_OTHER): Payer: Medicare HMO | Admitting: Family Medicine

## 2018-04-03 ENCOUNTER — Ambulatory Visit (INDEPENDENT_AMBULATORY_CARE_PROVIDER_SITE_OTHER): Payer: Medicare HMO

## 2018-04-03 ENCOUNTER — Other Ambulatory Visit: Payer: Self-pay

## 2018-04-03 ENCOUNTER — Other Ambulatory Visit: Payer: Self-pay | Admitting: Osteopathic Medicine

## 2018-04-03 VITALS — BP 103/55 | HR 97 | Wt 209.0 lb

## 2018-04-03 DIAGNOSIS — M7042 Prepatellar bursitis, left knee: Secondary | ICD-10-CM | POA: Diagnosis not present

## 2018-04-03 DIAGNOSIS — M170 Bilateral primary osteoarthritis of knee: Secondary | ICD-10-CM

## 2018-04-03 DIAGNOSIS — M25562 Pain in left knee: Secondary | ICD-10-CM | POA: Diagnosis not present

## 2018-04-03 DIAGNOSIS — M5416 Radiculopathy, lumbar region: Secondary | ICD-10-CM | POA: Diagnosis not present

## 2018-04-03 DIAGNOSIS — S8991XA Unspecified injury of right lower leg, initial encounter: Secondary | ICD-10-CM | POA: Diagnosis not present

## 2018-04-03 DIAGNOSIS — S8992XA Unspecified injury of left lower leg, initial encounter: Secondary | ICD-10-CM | POA: Diagnosis not present

## 2018-04-03 DIAGNOSIS — M7989 Other specified soft tissue disorders: Secondary | ICD-10-CM

## 2018-04-03 MED ORDER — PREDNISONE 10 MG PO TABS: 30.0000 mg | ORAL_TABLET | Freq: Every day | ORAL | 0 refills | Status: DC

## 2018-04-03 MED ORDER — DICLOFENAC SODIUM 1 % TD GEL: 4.0000 g | Freq: Four times a day (QID) | TRANSDERMAL | 11 refills | Status: DC

## 2018-04-03 MED ORDER — ISOSORBIDE MONONITRATE ER 30 MG PO TB24: 30.0000 mg | ORAL_TABLET | Freq: Every day | ORAL | 1 refills | Status: DC

## 2018-04-03 NOTE — Patient Instructions (Signed)
Thank you for coming in today. Take a short course of prednisone for 5 days.  Use the topical gel up to 4x daily for knee pain.  Recheck in 2 weeks.   The prednisone may increase the blood sugar.

## 2018-04-04 NOTE — Progress Notes (Signed)
Autumn Bowen is a 72 y.o. female who presents to [facility] today for follow-up left knee prepatellar bursitis and new knee pain.  Autumn Bowen had incision and drainage of hematoma at the anterior aspect of her knee that to be prepatellar bursitis on October 22.  She is feeling much better and notes that the anterior aspect of the knee is no longer painful.  She does however note new pain at the posterior knee present for a few days now.  She has this is located at the posterior lateral knee and refers to the medial knee.  This is worse with activity and better with rest.  Initially she notes pain in her low back and pain radiating down her posterior legs to her posterior thighs bilaterally.  She denies any injury fevers or chills nausea vomiting or diarrhea.  She feels well otherwise.    ROS:  As above  Exam:  BP (!) 103/55   Pulse 97   Wt 209 lb (94.8 kg)   LMP 09/21/1974   BMI 37.02 kg/m  General: Well Developed, well nourished, and in no acute distress.  Neuro/Psych: Alert and oriented x3, extra-ocular muscles intact, able to move all 4 extremities, sensation grossly intact. Skin: Warm and dry, no rashes noted.  Respiratory: Not using accessory muscles, speaking in full sentences, trachea midline.  Cardiovascular: Pulses palpable, no extremity edema. Abdomen: Does not appear distended. MSK:  Left knee: Anterior knee with small residual area of fullness with some fluctuance but no erythema or induration at the anterior medial knee overlying the proximal tibia.  No expressible pus.  Nontender in this area.  Range of motion full. Mildly tender to palpation posterior lateral knee. Stable ligamentous exam. Intact strength.  L-spine nontender normal flexion negative slump test bilaterally. Normal gait.    Lab and Radiology Results X-ray left knee images personally independently reviewed.  Moderate medial degenerative changes.  No acute  fractures.  Await formal radiology review.    Assessment and Plan: 72 y.o. female with  Left knee prepatellar bursitis significant improvement following incision and drainage.  No evidence of continued infection.  Autumn Bowen does have new posterior lateral knee pain.  I am not sure the cause of this.  I think it is possible this is lumbar radicular if she does have some pain radiating down the posterior aspect of her legs bilaterally.  Plan for short course of prednisone and trial of diclofenac gel.  Recheck in about 2 to 4 weeks return sooner if needed.    Orders Placed This Encounter  Procedures  . DG Knee Complete 4 Views Left    Please include patellar sunrise, lateral, and weightbearing bilateral AP and bilateral rosenberg views    Standing Status:   Future    Number of Occurrences:   1    Standing Expiration Date:   06/03/2019    Order Specific Question:   Reason for exam:    Answer:   Please include patellar sunrise, lateral, and weightbearing bilateral AP and bilateral rosenberg views    Comments:   Please include patellar sunrise, lateral, and weightbearing bilateral AP and bilateral rosenberg views    Order Specific Question:   Preferred imaging location?    Answer:   [facility]  . DG Knee 1-2 Views Right    Standing Status:   Future    Number of Occurrences:   1    Standing Expiration Date:   06/04/2019    Order Specific  Question:   Reason for Exam (SYMPTOM  OR DIAGNOSIS REQUIRED)    Answer:   For use with the left knee x-ray bilateral AP and Rosenberg standing.    Order Specific Question:   Preferred imaging location?    Answer:   [facility]   Meds ordered this encounter  Medications  . diclofenac sodium (VOLTAREN) 1 % GEL    Sig: Apply 4 g topically 4 (four) times daily. To affected joint.    Dispense:  100 g    Refill:  11    Knee OA left knee.  Tried and failed ibuprofen and aleve  . predniSONE (DELTASONE) 10 MG tablet    Sig: Take 3 tablets (30 mg  total) by mouth daily with breakfast.    Dispense:  15 tablet    Refill:  0    Historical information moved to improve visibility of documentation.  Past Medical History:  Diagnosis Date  . Allergy    Notes that grass and some trees causes eyes to sting, burn, and water.  . Anemia   . Celiac disease   . Celiac disease   . COPD (chronic obstructive pulmonary disease) ([place])   . Diabetes mellitus type II   . Diverticulitis   . Dyslipidemia   . HOH (hard of hearing)   . Hypertension   . Hypothyroidism   . Sleep apnea   . Sore throat    Past Surgical History:  Procedure Laterality Date  . APPENDECTOMY    . BREAST SURGERY     h/o benign cystleft breast and lymp node removal on right breast area.  Marland Kitchen EYE SURGERY     Left eye cataract removal, right eye h/o macular degeneration.  . INTRAVASCULAR PRESSURE WIRE/FFR STUDY N/A 12/07/2017   Procedure: INTRAVASCULAR PRESSURE WIRE/FFR STUDY;  Surgeon: Autumn Man, MD;  Location: [facility];  Service: Cardiovascular;  Laterality: N/A;  . LEFT HEART CATH AND CORONARY ANGIOGRAPHY N/A 12/07/2017   Procedure: LEFT HEART CATH AND CORONARY ANGIOGRAPHY;  Surgeon: Autumn Man, MD;  Location: [facility];  Service: Cardiovascular;  Laterality: N/A;  . SPINE SURGERY     Pt not sure of the type of surgery she had but knows it was L4-5.  Marland Kitchen VAGINAL HYSTERECTOMY     Social History   Tobacco Use  . Smoking status: Current Some Day Smoker    Packs/day: 0.25    Years: 51.00    Pack years: 12.75    Types: Cigarettes    Start date: 01/02/2012  . Smokeless tobacco: Never Used  . Tobacco comment: Been down to 1  Substance Use Topics  . Alcohol use: No   family history includes Alcohol abuse in her brother; Anxiety disorder in her mother; Asthma in her son; COPD in her father and sister; Cancer in her sister; Depression in her mother; Diabetes in her father and son; Diabetes type II in her mother; Drug abuse in her brother; Glaucoma  in her father; Hashimoto's thyroiditis in her sister; Heart attack in her mother; Hypertension in her father; Kidney disease in her sister; Stroke in her mother.  Medications: Current Outpatient Medications  Medication Sig Dispense Refill  . ACCU-CHEK AVIVA PLUS test strip test 1-2 TIMES daily 100 each 11  . acetaminophen (TYLENOL) 500 MG tablet Take 1,000 mg by mouth every 6 (six) hours as needed for mild pain or moderate pain.    Marland Kitchen AMBULATORY NON FORMULARY MEDICATION Glucometer, test strips and lancets for testing once  a day for DM, uncontrolled. 100 strip 0  . amitriptyline (ELAVIL) 50 MG tablet TAKE 1 TABLET BY MOUTH EVERY NIGHT AT BEDTIME 90 tablet 0  . aspirin EC 81 MG tablet Take 1 tablet (81 mg total) by mouth daily. 90 tablet 3  . cetirizine (ZYRTEC) 10 MG tablet Take 10 mg by mouth at bedtime.    . CHANTIX 1 MG tablet TAKE 1 TABLET BY MOUTH TWICE DAILY( EVERY TWELVE HOURS) 56 tablet 0  . cholecalciferol (VITAMIN D) 1000 units tablet Take 1,000 Units by mouth daily.    . clindamycin (CLEOCIN) 300 MG capsule Take 1 capsule (300 mg total) by mouth 2 (two) times daily. 14 capsule 0  . colchicine 0.6 MG tablet Take 1-2 tablets (0.6-1.2 mg total) by mouth daily. 60 tablet 3  . diazepam (VALIUM) 5 MG tablet Take 1 tab PO 1 hour before procedure or imaging. 5 tablet 0  . dicyclomine (BENTYL) 10 MG capsule Take 1 capsule (10 mg total) 2 (two) times daily by mouth. 180 capsule 3  . doxycycline (VIBRA-TABS) 100 MG tablet Take 1 tablet (100 mg total) by mouth 2 (two) times daily. 14 tablet 0  . empagliflozin (JARDIANCE) 10 MG TABS tablet Take 10 mg by mouth daily. 90 tablet 1  . EPIPEN 2-PAK 0.3 MG/0.3ML SOAJ injection USE AS DIRECTED 1 Device 1  . famotidine (PEPCID) 20 MG tablet TAKE 1 TABLET(20 MG TOTAL) BY MOUTH DAILY. 90 tablet 0  . Fluticasone-Salmeterol (ADVAIR) 250-50 MCG/DOSE AEPB Inhale 1 puff into the lungs every 12 (twelve) hours. 60 each 11  . GLUCOSAMINE-CHONDROITIN PO Take 1 tablet  by mouth 2 (two) times daily.     . Insulin Degludec (TRESIBA FLEXTOUCH) 200 UNIT/ML SOPN Inject 20 Units into the skin at bedtime. Increase by 2 units every 5 days until fasting glucose 90-120.    Marland Kitchen levocetirizine (XYZAL) 5 MG tablet Take 5 mg by mouth every evening.    Marland Kitchen levothyroxine (SYNTHROID, LEVOTHROID) 150 MCG tablet TAKE 1 TABLET BY MOUTH DAILY BEFORE BREAKFAST 30 tablet 0  . losartan (COZAAR) 25 MG tablet Take 1 tablet (25 mg total) by mouth daily. 30 tablet 5  . LUMIGAN 0.01 % SOLN Instill 1 drop into both eyes every night  3  . LYRICA 50 MG capsule TAKE ONE CAPSULE TWICE DAILY BY MOUTH 180 capsule 1  . metFORMIN (GLUCOPHAGE-XR) 750 MG 24 hr tablet TAKE 1 TABLET(750 MG TOTAL) BY MOUTH DAILY WITH BREAKFAST. 90 tablet 0  . Multiple Vitamins-Minerals (PRESERVISION AREDS 2 PO) Take 1 capsule by mouth 2 (two) times daily.    . mupirocin ointment (BACTROBAN) 2 % Apply 1 application topically 3 (three) times daily. 30 g 0  . ondansetron (ZOFRAN) 8 MG tablet TAKE 1 TABLET BY MOUTH EVERY 8 HOURS AS NEEDED FOR NAUSEA AND VOMITING 20 tablet 2  . QUEtiapine (SEROQUEL) 200 MG tablet TAKE 1 TABLET(200 MG TOTAL) BY MOUTH AT BEDTIME. 90 tablet 0  . rosuvastatin (CRESTOR) 40 MG tablet Take 1 tablet (40 mg total) by mouth daily. 90 tablet 3  . sertraline (ZOLOFT) 100 MG tablet TAKE 2 TABLETS BY MOUTH DAILY 120 tablet 0  . SURE COMFORT PEN NEEDLES 32G X 4 MM MISC USE AS DIRECTED 100 each prn  . triamcinolone cream (KENALOG) 0.1 % Apply 1 application topically 2 (two) times daily. For itchy bumps. 60 g 1  . diclofenac sodium (VOLTAREN) 1 % GEL Apply 4 g topically 4 (four) times daily. To affected joint. 100 g 11  .  isosorbide mononitrate (IMDUR) 30 MG 24 hr tablet Take 1 tablet (30 mg total) by mouth daily. 90 tablet 1  . nitroGLYCERIN (NITROSTAT) 0.4 MG SL tablet Place 1 tablet (0.4 mg total) under the tongue every 5 (five) minutes as needed for chest pain. 25 tablet 11  . predniSONE (DELTASONE) 10 MG  tablet Take 3 tablets (30 mg total) by mouth daily with breakfast. 15 tablet 0   No current facility-administered medications for this visit.    Allergies  Allergen Reactions  . Bee Venom Anaphylaxis  . Iodinated Diagnostic Agents Other (See Comments)    Per patient cardiac arrest IVP DYE-CARDIAC ARREST  . Penicillins Anaphylaxis    Has patient had a PCN reaction causing immediate rash, facial/tongue/throat swelling, SOB or lightheadedness with hypotension: yes Has patient had a PCN reaction causing severe rash involving mucus membranes or skin necrosis: no Has patient had a PCN reaction that required hospitalization: yes Has patient had a PCN reaction occurring within the last 10 years: no If all of the above answers are "NO", then may proceed with Cephalosporin use.   . Povidone Iodine Itching and Rash  . Tomato Itching  . Chocolate Rash  . Latex Rash  . Other Rash  . Atorvastatin Calcium Nausea Only  . Esomeprazole Magnesium Nausea Only  . Gluten Meal   . Metformin And Related Diarrhea  . Quetiapine Fumarate     Per pt she can not take XR  . Trazodone And Nefazodone Hives  . Zolpidem Tartrate Nausea And Vomiting      Discussed warning signs or symptoms. Please see discharge instructions. Patient expresses understanding.

## 2018-04-11 ENCOUNTER — Ambulatory Visit (INDEPENDENT_AMBULATORY_CARE_PROVIDER_SITE_OTHER): Payer: Medicare HMO | Admitting: Osteopathic Medicine

## 2018-04-11 ENCOUNTER — Encounter: Payer: Self-pay | Admitting: Osteopathic Medicine

## 2018-04-11 VITALS — BP 97/58 | HR 84 | Temp 97.4°F | Wt 208.9 lb

## 2018-04-11 DIAGNOSIS — E039 Hypothyroidism, unspecified: Secondary | ICD-10-CM

## 2018-04-11 DIAGNOSIS — G894 Chronic pain syndrome: Secondary | ICD-10-CM | POA: Diagnosis not present

## 2018-04-11 DIAGNOSIS — R5382 Chronic fatigue, unspecified: Secondary | ICD-10-CM

## 2018-04-11 DIAGNOSIS — I209 Angina pectoris, unspecified: Secondary | ICD-10-CM

## 2018-04-11 DIAGNOSIS — I959 Hypotension, unspecified: Secondary | ICD-10-CM | POA: Diagnosis not present

## 2018-04-11 DIAGNOSIS — Z794 Long term (current) use of insulin: Secondary | ICD-10-CM | POA: Diagnosis not present

## 2018-04-11 DIAGNOSIS — E118 Type 2 diabetes mellitus with unspecified complications: Secondary | ICD-10-CM | POA: Diagnosis not present

## 2018-04-11 DIAGNOSIS — R351 Nocturia: Secondary | ICD-10-CM | POA: Diagnosis not present

## 2018-04-11 LAB — POCT GLYCOSYLATED HEMOGLOBIN (HGB A1C): Hemoglobin A1C: 7.6 % — AB (ref 4.0–5.6)

## 2018-04-11 NOTE — Progress Notes (Signed)
HPI: Autumn Bowen is a 72 y.o. female who  has a past medical history of Allergy, Anemia, Celiac disease, Celiac disease, COPD (chronic obstructive pulmonary disease) ([place]), Diabetes mellitus type II, Diverticulitis, Dyslipidemia, HOH (hard of hearing), Hypertension, Hypothyroidism, Sleep apnea, and Sore throat.  she presents to [facility] today, 04/11/18,  for chief complaint of:  DM2 routine follow-up Chest pain f/u Back pain f/u  DM2: A1C last visit 3 mos ago was 8.1%. Dental issues have hindered diet complliance, has been eating mostly soup and soft foods. Pt on statin. Reports eye exam done but we still don't have records. LDL last check was 73 in 11/2017. Last visit we discussed titrating up a bit on Tresiba reduced to 18 units, at last visit was still having AM hyperglycemia on occasion.   Chest pain follow-up: 12/07/17 heart cath - "Angiographically moderate mid RCA lesion that was minimally reactive and FFR. Not physiologically significant. Otherwise angiographically normal coronary arteries. Normal LV function with normal EDP" taking nitro as needed. Follow up w/ Cardio 03/21/18 still having intermittent chest pain. Switched to Crestor 40 mg, stopped Zocor and Zetia. Advised f/u 3 mos   Reports some increased urinary frequency and would like urine analysis done today.  Patient cannot recall which medications were discontinued by cardiology  Patient reports feeling of tiredness, chronic fatigue.  Still smoking.  Blood pressure is on the low side today, a bit better with recheck at 97/58.  Patient denies any dizziness, orthostatic symptoms.  No easy bruising or bleeding, no blood in stool.     Past medical history, surgical history, and family history reviewed.  Current medication list and allergy/intolerance information reviewed.   (See remainder of HPI, ROS, Phys Exam below)       ASSESSMENT/PLAN:   Chronic fatigue - Plan: CBC,  COMPLETE METABOLIC PANEL WITH GFR, TSH  Hypotension, unspecified hypotension type - Plan: CBC, COMPLETE METABOLIC PANEL WITH GFR, TSH  Angina pectoris (HCC) - Plan: CBC, COMPLETE METABOLIC PANEL WITH GFR, TSH  Hypothyroidism, unspecified type - Plan: CBC, COMPLETE METABOLIC PANEL WITH GFR, TSH  Chronic pain syndrome - Plan: CBC, COMPLETE METABOLIC PANEL WITH GFR, TSH  Type 2 diabetes mellitus with complication, with long-term current use of insulin (HCC) - Plan: POCT HgB A1C, CBC, COMPLETE METABOLIC PANEL WITH GFR, TSH  Nocturia - Plan: POCT Urinalysis Dipstick, Urinalysis, microscopic only, Urine Culture     Patient Instructions  Cardiology stopped:  Zocor (simvastatin)   Zetia (ezetimibe)   Low blood pressure:  Labs today  Stop taking Losartan   Will recheck BP with nurse on Friday or Monday   Sugars look good!     If blood pressure still low would probably go ahead and reduce dose of Imdur and have her follow-up with cardiology.   Follow-up plan: Return for nurse visit Fri or Mon to recheck BP .                   ############################################ ############################################ ############################################ ############################################    Outpatient Encounter Medications as of 04/11/2018  Medication Sig Note  . ACCU-CHEK AVIVA PLUS test strip TEST 1-2 TIMES DAILY   . acetaminophen (TYLENOL) 500 MG tablet Take 1,000 mg by mouth every 6 (six) hours as needed for mild pain or moderate pain.   Marland Kitchen AMBULATORY NON FORMULARY MEDICATION Glucometer, test strips and lancets for testing once a day for DM, uncontrolled.   Marland Kitchen amitriptyline (ELAVIL) 50 MG tablet TAKE 1 TABLET BY MOUTH  EVERY NIGHT AT BEDTIME   . aspirin EC 81 MG tablet Take 1 tablet (81 mg total) by mouth daily. 02/21/2018: As per pt, taking twice daily  . cetirizine (ZYRTEC) 10 MG tablet Take 10 mg by mouth at bedtime.   . CHANTIX 1 MG tablet  TAKE 1 TABLET BY MOUTH TWICE DAILY( EVERY TWELVE HOURS)   . CHANTIX 1 MG tablet TAKE 1 TABLET BY MOUTH TWICE DAILY( EVERY TWELVE HOURS)   . cholecalciferol (VITAMIN D) 1000 units tablet Take 1,000 Units by mouth daily.   . clindamycin (CLEOCIN) 300 MG capsule Take 1 capsule (300 mg total) by mouth 2 (two) times daily.   . colchicine 0.6 MG tablet Take 1-2 tablets (0.6-1.2 mg total) by mouth daily.   . diazepam (VALIUM) 5 MG tablet Take 1 tab PO 1 hour before procedure or imaging.   . diclofenac sodium (VOLTAREN) 1 % GEL Apply 4 g topically 4 (four) times daily. To affected joint.   Marland Kitchen dicyclomine (BENTYL) 10 MG capsule Take 1 capsule (10 mg total) 2 (two) times daily by mouth.   . doxycycline (VIBRA-TABS) 100 MG tablet Take 1 tablet (100 mg total) by mouth 2 (two) times daily.   . empagliflozin (JARDIANCE) 10 MG TABS tablet Take 10 mg by mouth daily.   Marland Kitchen EPIPEN 2-PAK 0.3 MG/0.3ML SOAJ injection USE AS DIRECTED   . famotidine (PEPCID) 20 MG tablet TAKE 1 TABLET(20 MG TOTAL) BY MOUTH DAILY.   Marland Kitchen Fluticasone-Salmeterol (ADVAIR) 250-50 MCG/DOSE AEPB Inhale 1 puff into the lungs every 12 (twelve) hours.   Marland Kitchen GLUCOSAMINE-CHONDROITIN PO Take 1 tablet by mouth 2 (two) times daily.    . Insulin Degludec (TRESIBA FLEXTOUCH) 200 UNIT/ML SOPN Inject 20 Units into the skin at bedtime. Increase by 2 units every 5 days until fasting glucose 90-120.   Marland Kitchen isosorbide mononitrate (IMDUR) 30 MG 24 hr tablet Take 1 tablet (30 mg total) by mouth daily.   Marland Kitchen levocetirizine (XYZAL) 5 MG tablet Take 5 mg by mouth every evening.   Marland Kitchen levothyroxine (SYNTHROID, LEVOTHROID) 150 MCG tablet TAKE 1 TABLET BY MOUTH DAILY BEFORE BREAKFAST   . losartan (COZAAR) 25 MG tablet Take 1 tablet (25 mg total) by mouth daily.   Marland Kitchen LUMIGAN 0.01 % SOLN Instill 1 drop into both eyes every night   . LYRICA 50 MG capsule TAKE ONE CAPSULE TWICE DAILY BY MOUTH   . metFORMIN (GLUCOPHAGE-XR) 750 MG 24 hr tablet TAKE 1 TABLET(750 MG TOTAL) BY MOUTH DAILY  WITH BREAKFAST.   . Multiple Vitamins-Minerals (PRESERVISION AREDS 2 PO) Take 1 capsule by mouth 2 (two) times daily.   . mupirocin ointment (BACTROBAN) 2 % Apply 1 application topically 3 (three) times daily.   . nitroGLYCERIN (NITROSTAT) 0.4 MG SL tablet Place 1 tablet (0.4 mg total) under the tongue every 5 (five) minutes as needed for chest pain.   Marland Kitchen ondansetron (ZOFRAN) 8 MG tablet TAKE 1 TABLET BY MOUTH EVERY 8 HOURS AS NEEDED FOR NAUSEA AND VOMITING   . predniSONE (DELTASONE) 10 MG tablet Take 3 tablets (30 mg total) by mouth daily with breakfast.   . QUEtiapine (SEROQUEL) 200 MG tablet TAKE 1 TABLET(200 MG TOTAL) BY MOUTH AT BEDTIME.   . rosuvastatin (CRESTOR) 40 MG tablet Take 1 tablet (40 mg total) by mouth daily.   . sertraline (ZOLOFT) 100 MG tablet TAKE 2 TABLETS BY MOUTH DAILY   . SURE COMFORT PEN NEEDLES 32G X 4 MM MISC USE AS DIRECTED   . triamcinolone cream (  KENALOG) 0.1 % Apply 1 application topically 2 (two) times daily. For itchy bumps. 06/22/2016: Pt. Reports that she takes this prn.   No facility-administered encounter medications on file as of 04/11/2018.    Allergies  Allergen Reactions  . Bee Venom Anaphylaxis  . Iodinated Diagnostic Agents Other (See Comments)    Per patient cardiac arrest IVP DYE-CARDIAC ARREST  . Penicillins Anaphylaxis    Has patient had a PCN reaction causing immediate rash, facial/tongue/throat swelling, SOB or lightheadedness with hypotension: yes Has patient had a PCN reaction causing severe rash involving mucus membranes or skin necrosis: no Has patient had a PCN reaction that required hospitalization: yes Has patient had a PCN reaction occurring within the last 10 years: no If all of the above answers are "NO", then may proceed with Cephalosporin use.   . Povidone Iodine Itching and Rash  . Tomato Itching  . Chocolate Rash  . Latex Rash  . Other Rash  . Atorvastatin Calcium Nausea Only  . Esomeprazole Magnesium Nausea Only  . Gluten  Meal   . Metformin And Related Diarrhea  . Quetiapine Fumarate     Per pt she can not take XR  . Trazodone And Nefazodone Hives  . Zolpidem Tartrate Nausea And Vomiting      Review of Systems:  Constitutional: No recent illness  HEENT: No  headache, no vision change  Cardiac: +occasional chest pain, No  pressure, No palpitations  Respiratory:  No  shortness of breath. No  Cough  Gastrointestinal: No  abdominal pain, no change on bowel habits  Hem/Onc: No  easy bruising/bleeding, No  abnormal lumps/bumps  Neurologic: No  weakness, No  Dizziness  Psychiatric: No  concerns with depression, No  concerns with anxiety  Exam:  BP (!) 97/58 (BP Location: Left Arm, Patient Position: Sitting, Cuff Size: Large)   Pulse 84   Temp (!) 97.4 F (36.3 C) (Oral)   Wt 208 lb 14.4 oz (94.8 kg)   LMP 09/21/1974   BMI 37.00 kg/m   Constitutional: VS see above. General Appearance: alert, well-developed, well-nourished, NAD  Eyes: Normal lids and conjunctive, non-icteric sclera  Neck: No masses, trachea midline.   Respiratory: Normal respiratory effort. no wheeze, no rhonchi, no rales  Cardiovascular: S1/S2 normal, no murmur, no rub/gallop auscultated. RRR.   Musculoskeletal: Gait normal. Symmetric and independent movement of all extremities  Neurological: Normal balance/coordination. No tremor.  Skin: warm, dry, intact.   Psychiatric: Fair judgment/insight. Normal mood and affect. Oriented x3.   Visit summary with medication list and pertinent instructions was printed for patient to review, advised to alert [place] if any changes needed. All questions at time of visit were answered - patient instructed to contact office with any additional concerns. ER/RTC precautions were reviewed with the patient and understanding verbalized.   Follow-up plan: Return for nurse visit Fri or Mon to recheck BP .  Note: Total time spent 40 minutes, greater than 50% of the visit was spent face-to-face  counseling and coordinating care for the following: The primary encounter diagnosis was Chronic fatigue. Diagnoses of Hypotension, unspecified hypotension type, Angina pectoris ([place]), Hypothyroidism, unspecified type, Chronic pain syndrome, Type 2 diabetes mellitus with complication, with long-term current use of insulin ([place]), and Nocturia were also pertinent to this visit.Marland Kitchen  Please note: voice recognition software was used to produce this document, and typos may escape review. Please contact Dr. Sheppard Coil for any needed clarifications.

## 2018-04-11 NOTE — Patient Instructions (Addendum)
Cardiology stopped:  Zocor (simvastatin)   Zetia (ezetimibe)   Low blood pressure:  Labs today  Stop taking Losartan   Will recheck BP with nurse on Friday or Monday   Sugars look good!

## 2018-04-12 LAB — URINALYSIS, MICROSCOPIC ONLY
Bacteria, UA: NONE SEEN /HPF
Hyaline Cast: NONE SEEN /LPF

## 2018-04-12 LAB — CBC
HCT: 41.6 % (ref 35.0–45.0)
Hemoglobin: 13.7 g/dL (ref 11.7–15.5)
MCH: 28.9 pg (ref 27.0–33.0)
MCHC: 32.9 g/dL (ref 32.0–36.0)
MCV: 87.8 fL (ref 80.0–100.0)
MPV: 10.1 fL (ref 7.5–12.5)
Platelets: 327 10*3/uL (ref 140–400)
RBC: 4.74 10*6/uL (ref 3.80–5.10)
RDW: 13.2 % (ref 11.0–15.0)
WBC: 13 10*3/uL — ABNORMAL HIGH (ref 3.8–10.8)

## 2018-04-12 LAB — URINE CULTURE
MICRO NUMBER:: 91367829
SPECIMEN QUALITY:: ADEQUATE

## 2018-04-12 LAB — COMPLETE METABOLIC PANEL WITH GFR
AG Ratio: 1.7 (calc) (ref 1.0–2.5)
ALT: 23 U/L (ref 6–29)
AST: 25 U/L (ref 10–35)
Albumin: 4.3 g/dL (ref 3.6–5.1)
Alkaline phosphatase (APISO): 49 U/L (ref 33–130)
BUN: 17 mg/dL (ref 7–25)
CO2: 28 mmol/L (ref 20–32)
Calcium: 9.3 mg/dL (ref 8.6–10.4)
Chloride: 102 mmol/L (ref 98–110)
Creat: 0.71 mg/dL (ref 0.60–0.93)
GFR, Est African American: 99 mL/min/{1.73_m2} (ref >=60)
GFR, Est Non African American: 85 mL/min/{1.73_m2} (ref >=60)
Globulin: 2.5 g/dL (calc) (ref 1.9–3.7)
Glucose, Bld: 137 mg/dL — ABNORMAL HIGH (ref 65–99)
Potassium: 4.4 mmol/L (ref 3.5–5.3)
Sodium: 138 mmol/L (ref 135–146)
Total Bilirubin: 0.4 mg/dL (ref 0.2–1.2)
Total Protein: 6.8 g/dL (ref 6.1–8.1)

## 2018-04-12 LAB — TSH: TSH: 0.9 mIU/L (ref 0.40–4.50)

## 2018-04-13 ENCOUNTER — Ambulatory Visit: Payer: Medicare HMO

## 2018-04-13 ENCOUNTER — Encounter: Payer: Self-pay | Admitting: Osteopathic Medicine

## 2018-04-13 LAB — POCT URINALYSIS DIPSTICK
Bilirubin, UA: NEGATIVE
Blood, UA: NEGATIVE
Glucose, UA: POSITIVE — AB
Ketones, UA: NEGATIVE
Leukocytes, UA: NEGATIVE
Nitrite, UA: NEGATIVE
Protein, UA: NEGATIVE
Spec Grav, UA: 1.015 (ref 1.010–1.025)
Urobilinogen, UA: 0.2 E.U./dL
pH, UA: 5.5 (ref 5.0–8.0)

## 2018-04-20 ENCOUNTER — Other Ambulatory Visit: Payer: Self-pay | Admitting: Osteopathic Medicine

## 2018-04-23 ENCOUNTER — Ambulatory Visit: Payer: Medicare HMO | Admitting: Osteopathic Medicine

## 2018-04-23 ENCOUNTER — Ambulatory Visit (INDEPENDENT_AMBULATORY_CARE_PROVIDER_SITE_OTHER): Payer: Medicare HMO | Admitting: Family Medicine

## 2018-04-23 ENCOUNTER — Encounter: Payer: Self-pay | Admitting: Family Medicine

## 2018-04-23 ENCOUNTER — Encounter: Payer: Self-pay | Admitting: Osteopathic Medicine

## 2018-04-23 ENCOUNTER — Ambulatory Visit (INDEPENDENT_AMBULATORY_CARE_PROVIDER_SITE_OTHER): Payer: Medicare HMO | Admitting: Osteopathic Medicine

## 2018-04-23 VITALS — BP 133/83 | HR 91 | Wt 211.0 lb

## 2018-04-23 DIAGNOSIS — M25562 Pain in left knee: Secondary | ICD-10-CM

## 2018-04-23 DIAGNOSIS — I951 Orthostatic hypotension: Secondary | ICD-10-CM | POA: Diagnosis not present

## 2018-04-23 DIAGNOSIS — R42 Dizziness and giddiness: Secondary | ICD-10-CM | POA: Diagnosis not present

## 2018-04-23 NOTE — Patient Instructions (Addendum)
Hypotension As your heart beats, it forces blood through your body. This force is called blood pressure. If you have hypotension, you have low blood pressure. When your blood pressure is too low, you may not get enough blood to your brain. You may feel weak, feel light-headed, have a fast heartbeat, or even pass out (faint). Follow these instructions at home: Eating and drinking  Drink enough fluids to keep your pee (urine) clear or pale yellow.  Eat a healthy diet, and follow instructions from your doctor about eating or drinking restrictions. A healthy diet includes: ? Fresh fruits and vegetables. ? Whole grains. ? Low-fat (lean) meats. ? Low-fat dairy products.  Eat extra salt only as told. Do not add extra salt to your diet unless your doctor tells you to.  Eat small meals often.  Avoid standing up quickly after you eat. Medicines  Take over-the-counter and prescription medicines only as told by your doctor. ? Follow instructions from your doctor about changing how much you take (the dosage) of your medicines, if this applies. ? Do not stop or change your medicine on your own. General instructions  Wear compression stockings as told by your doctor.  Get up slowly from lying down or sitting.  Avoid hot showers and a lot of heat as told by your doctor.  Return to your normal activities as told by your doctor. Ask what activities are safe for you.  Do not use any products that contain nicotine or tobacco, such as cigarettes and e-cigarettes. If you need help quitting, ask your doctor.  Keep all follow-up visits as told by your doctor. This is important. Contact a doctor if:  You throw up (vomit).  You have watery poop (diarrhea).  You have a fever for more than 2-3 days.  You feel more thirsty than normal.  You feel weak and tired. Get help right away if:  You have chest pain.  You have a fast or irregular heartbeat.  You lose feeling (get numbness) in any part  of your body.  You cannot move your arms or your legs.  You have trouble talking.  You get sweaty or feel light-headed.  You faint.  You have trouble breathing.  You have trouble staying awake.  You feel confused. This information is not intended to replace advice given to you by your health care provider. Make sure you discuss any questions you have with your health care provider. Document Released: 08/10/2009 Document Revised: 02/02/2016 Document Reviewed: 02/02/2016 Elsevier Interactive Patient Education  2017 [employer].

## 2018-04-23 NOTE — Patient Instructions (Signed)
Thank you for coming in today. Continue diclofenac gel.  Continue hamstring stretching and exercises.   Follow up with Dr Sheppard Coil for your blood pressure.   Recheck with me as needed in 1-2 months.

## 2018-04-23 NOTE — Progress Notes (Signed)
Autumn Bowen is a 72 y.o. female who presents to [place] today for follow-up on left knee pain. Autumn Bowen notes that the pain has changed since her last visit. Certain movements cause the pain at the posterior lateral aspect of her knee radiating upward and other movements cause the pain to radiate across the front of her knee and down her leg. She also notes that her left knee is now buckling, making it difficult to walk. She has had to lift her left leg to get into the car.   The diclofenac gel has been working very well for her, and she thinks the prednisone helped as well. She does exercises and massages her leg while she is in the bed.  Blood pressure: She also complains of dizziness and vision changes when she bends over.   ROS:  As above  Exam:  BP 133/83   Pulse 91   Wt 211 lb (95.7 kg)   LMP 09/21/1974   BMI 37.38 kg/m  General: Well Developed, well nourished, and in no acute distress.  Neuro/Psych: Alert and oriented x3, extra-ocular muscles intact, able to move all 4 extremities, sensation grossly intact. Skin: Warm and dry, no rashes noted.  Respiratory: Not using accessory muscles, speaking in full sentences, trachea midline.  Cardiovascular: Pulses palpable, no extremity edema. Abdomen: Does not appear distended. MSK:  Left knee:  Mild bruising and small scar remain around the medial patella from previous I&D.  Tender to palpation around the posterior lateral aspect of knee. Normal ROM. Negative slump test.  Pulses and capillary refill normal.   Right knee: No tenderness to palpation. Normal ROM and strength. Pulses and capillary refill normal.   Lab and Radiology Results  Dg Knee 1-2 Views Right  Result Date: 04/04/2018 CLINICAL DATA:  Left anterior knee pain for several days after falling onto the knee. No other complaints. EXAM: LEFT KNEE - COMPLETE 4+ VIEW; RIGHT KNEE - 1-2 VIEW COMPARISON:  Left knee  series of November 16, 2017 FINDINGS: Right knee: The bones are subjectively adequately mineralized. There is moderate narrowing of the medial joint space. There is a prominent osteophyte arising from the articular margin of the medial tibial plateau. There is beaking of the tibial spines. No lateral view was requested. Left knee: The bones are subjectively adequately mineralized. There is mild narrowing of the medial joint compartment. There is beaking of the tibial spines. Tiny spurs arise from the articular margins of the patella. There is mild swelling of the subcutaneous tissues of the infrapatellar region. No acute fracture or dislocation is observed. IMPRESSION: Mild soft tissue swelling of the infrapatellar region of the left knee may be post traumatic. No acute fracture or dislocation. Mild degenerative change of the medial and patellofemoral compartments of the left knee. Moderate osteoarthritic joint space loss of the medial compartment of the right knee. Mild beaking of the tibial spines. Electronically Signed   By: David  Martinique M.D.   On: 04/04/2018 07:53   Dg Knee Complete 4 Views Left  Result Date: 04/04/2018 CLINICAL DATA:  Left anterior knee pain for several days after falling onto the knee. No other complaints. EXAM: LEFT KNEE - COMPLETE 4+ VIEW; RIGHT KNEE - 1-2 VIEW COMPARISON:  Left knee series of November 16, 2017 FINDINGS: Right knee: The bones are subjectively adequately mineralized. There is moderate narrowing of the medial joint space. There is a prominent osteophyte arising from the articular margin of the medial tibial plateau.  There is beaking of the tibial spines. No lateral view was requested. Left knee: The bones are subjectively adequately mineralized. There is mild narrowing of the medial joint compartment. There is beaking of the tibial spines. Tiny spurs arise from the articular margins of the patella. There is mild swelling of the subcutaneous tissues of the infrapatellar region. No  acute fracture or dislocation is observed. IMPRESSION: Mild soft tissue swelling of the infrapatellar region of the left knee may be post traumatic. No acute fracture or dislocation. Mild degenerative change of the medial and patellofemoral compartments of the left knee. Moderate osteoarthritic joint space loss of the medial compartment of the right knee. Mild beaking of the tibial spines. Electronically Signed   By: David  Martinique M.D.   On: 04/04/2018 07:53   I personally (independently) visualized and performed the interpretation of the images attached in this note.    Assessment and Plan: 72 y.o. female with  Left knee pain: Autumn Bowen continues to have left knee pain that is shooting up and down her leg. She had great symptom relief with diclofenac gel. Continue diclofenac gel, as well as hamstring stretches and exercises. She is not interested in injection at this time. Recheck in 1-2 months if needed.   Blood pressure: Follow-up with Dr. Sheppard Coil.   Historical information moved to improve visibility of documentation.  Past Medical History:  Diagnosis Date  . Allergy    Notes that grass and some trees causes eyes to sting, burn, and water.  . Anemia   . Celiac disease   . Celiac disease   . COPD (chronic obstructive pulmonary disease) ([place])   . Diabetes mellitus type II   . Diverticulitis   . Dyslipidemia   . HOH (hard of hearing)   . Hypertension   . Hypothyroidism   . Sleep apnea   . Sore throat    Past Surgical History:  Procedure Laterality Date  . APPENDECTOMY    . BREAST SURGERY     h/o benign cystleft breast and lymp node removal on right breast area.  Marland Kitchen EYE SURGERY     Left eye cataract removal, right eye h/o macular degeneration.  . INTRAVASCULAR PRESSURE WIRE/FFR STUDY N/A 12/07/2017   Procedure: INTRAVASCULAR PRESSURE WIRE/FFR STUDY;  Surgeon: Leonie Man, MD;  Location: [facility];  Service: Cardiovascular;  Laterality: N/A;  . LEFT HEART CATH AND  CORONARY ANGIOGRAPHY N/A 12/07/2017   Procedure: LEFT HEART CATH AND CORONARY ANGIOGRAPHY;  Surgeon: Leonie Man, MD;  Location: [facility];  Service: Cardiovascular;  Laterality: N/A;  . SPINE SURGERY     Pt not sure of the type of surgery she had but knows it was L4-5.  Marland Kitchen VAGINAL HYSTERECTOMY     Social History   Tobacco Use  . Smoking status: Current Some Day Smoker    Packs/day: 0.25    Years: 51.00    Pack years: 12.75    Types: Cigarettes    Start date: 01/02/2012  . Smokeless tobacco: Never Used  . Tobacco comment: Been down to 1 - 2 cigs a day  Substance Use Topics  . Alcohol use: No   family history includes Alcohol abuse in her brother; Anxiety disorder in her mother; Asthma in her son; COPD in her father and sister; Cancer in her sister; Depression in her mother; Diabetes in her father and son; Diabetes type II in her mother; Drug abuse in her brother; Glaucoma in her father; Hashimoto's thyroiditis in  her sister; Heart attack in her mother; Hypertension in her father; Kidney disease in her sister; Stroke in her mother.  Medications: Current Outpatient Medications  Medication Sig Dispense Refill  . ACCU-CHEK AVIVA PLUS test strip TEST 1-2 TIMES DAILY 100 each 1  . acetaminophen (TYLENOL) 500 MG tablet Take 1,000 mg by mouth every 6 (six) hours as needed for mild pain or moderate pain.    Marland Kitchen AMBULATORY NON FORMULARY MEDICATION Glucometer, test strips and lancets for testing once a day for DM, uncontrolled. 100 strip 0  . amitriptyline (ELAVIL) 50 MG tablet TAKE 1 TABLET BY MOUTH EVERY NIGHT AT BEDTIME 90 tablet 0  . aspirin EC 81 MG tablet Take 1 tablet (81 mg total) by mouth daily. 90 tablet 3  . cetirizine (ZYRTEC) 10 MG tablet Take 10 mg by mouth at bedtime.    . CHANTIX 1 MG tablet TAKE 1 TABLET BY MOUTH TWICE DAILY( EVERY TWELVE HOURS) 56 tablet 0  . CHANTIX 1 MG tablet TAKE 1 TABLET BY MOUTH TWICE DAILY( EVERY TWELVE HOURS) 56 tablet 0  . cholecalciferol  (VITAMIN D) 1000 units tablet Take 1,000 Units by mouth daily.    . colchicine 0.6 MG tablet Take 1-2 tablets (0.6-1.2 mg total) by mouth daily. 60 tablet 3  . diazepam (VALIUM) 5 MG tablet Take 1 tab PO 1 hour before procedure or imaging. 5 tablet 0  . diclofenac sodium (VOLTAREN) 1 % GEL Apply 4 g topically 4 (four) times daily. To affected joint. 100 g 11  . dicyclomine (BENTYL) 10 MG capsule Take 1 capsule (10 mg total) 2 (two) times daily by mouth. 180 capsule 3  . doxycycline (VIBRA-TABS) 100 MG tablet Take 1 tablet (100 mg total) by mouth 2 (two) times daily. 14 tablet 0  . empagliflozin (JARDIANCE) 10 MG TABS tablet Take 10 mg by mouth daily. 90 tablet 1  . EPIPEN 2-PAK 0.3 MG/0.3ML SOAJ injection USE AS DIRECTED 1 Device 1  . famotidine (PEPCID) 20 MG tablet TAKE 1 TABLET(20 MG TOTAL) BY MOUTH DAILY. 90 tablet 0  . Fluticasone-Salmeterol (ADVAIR) 250-50 MCG/DOSE AEPB Inhale 1 puff into the lungs every 12 (twelve) hours. 60 each 11  . GLUCOSAMINE-CHONDROITIN PO Take 1 tablet by mouth 2 (two) times daily.     . Insulin Degludec (TRESIBA FLEXTOUCH) 200 UNIT/ML SOPN Inject 20 Units into the skin at bedtime. Increase by 2 units every 5 days until fasting glucose 90-120.    Marland Kitchen isosorbide mononitrate (IMDUR) 30 MG 24 hr tablet Take 1 tablet (30 mg total) by mouth daily. 90 tablet 1  . levocetirizine (XYZAL) 5 MG tablet Take 5 mg by mouth every evening.    Marland Kitchen levothyroxine (SYNTHROID, LEVOTHROID) 150 MCG tablet TAKE 1 TABLET BY MOUTH DAILY BEFORE BREAKFAST 30 tablet 0  . LUMIGAN 0.01 % SOLN Instill 1 drop into both eyes every night  3  . LYRICA 50 MG capsule TAKE ONE CAPSULE TWICE DAILY BY MOUTH 180 capsule 1  . metFORMIN (GLUCOPHAGE-XR) 750 MG 24 hr tablet TAKE 1 TABLET(750 MG TOTAL) BY MOUTH DAILY WITH BREAKFAST. 90 tablet 0  . Multiple Vitamins-Minerals (PRESERVISION AREDS 2 PO) Take 1 capsule by mouth 2 (two) times daily.    . mupirocin ointment (BACTROBAN) 2 % Apply 1 application topically 3  (three) times daily. 30 g 0  . ondansetron (ZOFRAN) 8 MG tablet TAKE 1 TABLET BY MOUTH EVERY 8 HOURS AS NEEDED FOR NAUSEA AND VOMITING 20 tablet 2  . QUEtiapine (SEROQUEL) 200 MG tablet  TAKE 1 TABLET(200 MG TOTAL) BY MOUTH AT BEDTIME. 90 tablet 0  . rosuvastatin (CRESTOR) 40 MG tablet Take 1 tablet (40 mg total) by mouth daily. 90 tablet 3  . sertraline (ZOLOFT) 100 MG tablet TAKE 2 TABLETS BY MOUTH DAILY 120 tablet 0  . SURE COMFORT PEN NEEDLES 32G X 4 MM MISC USE AS DIRECTED 100 each prn  . triamcinolone cream (KENALOG) 0.1 % Apply 1 application topically 2 (two) times daily. For itchy bumps. 60 g 1  . nitroGLYCERIN (NITROSTAT) 0.4 MG SL tablet Place 1 tablet (0.4 mg total) under the tongue every 5 (five) minutes as needed for chest pain. 25 tablet 11   No current facility-administered medications for this visit.    Allergies  Allergen Reactions  . Bee Venom Anaphylaxis  . Iodinated Diagnostic Agents Other (See Comments)    Per patient cardiac arrest IVP DYE-CARDIAC ARREST  . Penicillins Anaphylaxis    Has patient had a PCN reaction causing immediate rash, facial/tongue/throat swelling, SOB or lightheadedness with hypotension: yes Has patient had a PCN reaction causing severe rash involving mucus membranes or skin necrosis: no Has patient had a PCN reaction that required hospitalization: yes Has patient had a PCN reaction occurring within the last 10 years: no If all of the above answers are "NO", then may proceed with Cephalosporin use.   . Povidone Iodine Itching and Rash  . Tomato Itching  . Chocolate Rash  . Latex Rash  . Other Rash  . Atorvastatin Calcium Nausea Only  . Esomeprazole Magnesium Nausea Only  . Gluten Meal   . Metformin And Related Diarrhea  . Quetiapine Fumarate     Per pt she can not take XR  . Trazodone And Nefazodone Hives  . Zolpidem Tartrate Nausea And Vomiting      Discussed warning signs or symptoms. Please see discharge instructions. Patient  expresses understanding.  I personally was present and performed or re-performed the history, physical exam and medical decision-making activities of this service and have verified that the service and findings are accurately documented in the student's note. ___________________________________________ Lynne Leader M.D., ABFM., CAQSM. Primary Care and Sports Medicine Adjunct Instructor of [employer] [employer]

## 2018-04-23 NOTE — Progress Notes (Signed)
HPI: Autumn Bowen is a 72 y.o. female who  has a past medical history of Allergy, Anemia, Celiac disease, Celiac disease, COPD (chronic obstructive pulmonary disease) ([place]), Diabetes mellitus type II, Diverticulitis, Dyslipidemia, HOH (hard of hearing), Hypertension, Hypothyroidism, Sleep apnea, and Sore throat.  she presents to [facility] today, 04/23/18,  for chief complaint of:  Lightheaded  Mentioned feeling lightheaded at visit today w/ Dr Georgina Snell, so I went in to see her. She reports no major changes, always a bit light headed when she stands up from leaning over or sitting, she has adapted to this a bit with moving slowly, trying to stya hydrated. We d/c losartan last visit d/t hypotension and she feels fine off this Rx.     At today's visit... Past medical history, surgical history, and family history reviewed and updated as needed.  Current medication list and allergy/intolerance information reviewed and updated as needed. (See remainder of HPI, ROS, Phys Exam below)   Orthostatic VS for the past 24 hrs (Last 3 readings):  BP- Lying Pulse- Lying BP- Sitting Pulse- Sitting BP- Standing at 0 minutes Pulse- Standing at 0 minutes  04/23/18 1618 123/79 90 103/67 92 102/66 97         ASSESSMENT/PLAN: The primary encounter diagnosis was Lightheadedness. A diagnosis of Orthostatic hypotension was also pertinent to this visit.  Stable chronic issue, printed instructions for patient, precautions reviewed.     Patient Instructions  Hypotension As your heart beats, it forces blood through your body. This force is called blood pressure. If you have hypotension, you have low blood pressure. When your blood pressure is too low, you may not get enough blood to your brain. You may feel weak, feel light-headed, have a fast heartbeat, or even pass out (faint). Follow these instructions at home: Eating and drinking  Drink enough fluids to keep your  pee (urine) clear or pale yellow.  Eat a healthy diet, and follow instructions from your doctor about eating or drinking restrictions. A healthy diet includes: ? Fresh fruits and vegetables. ? Whole grains. ? Low-fat (lean) meats. ? Low-fat dairy products.  Eat extra salt only as told. Do not add extra salt to your diet unless your doctor tells you to.  Eat small meals often.  Avoid standing up quickly after you eat. Medicines  Take over-the-counter and prescription medicines only as told by your doctor. ? Follow instructions from your doctor about changing how much you take (the dosage) of your medicines, if this applies. ? Do not stop or change your medicine on your own. General instructions  Wear compression stockings as told by your doctor.  Get up slowly from lying down or sitting.  Avoid hot showers and a lot of heat as told by your doctor.  Return to your normal activities as told by your doctor. Ask what activities are safe for you.  Do not use any products that contain nicotine or tobacco, such as cigarettes and e-cigarettes. If you need help quitting, ask your doctor.  Keep all follow-up visits as told by your doctor. This is important. Contact a doctor if:  You throw up (vomit).  You have watery poop (diarrhea).  You have a fever for more than 2-3 days.  You feel more thirsty than normal.  You feel weak and tired. Get help right away if:  You have chest pain.  You have a fast or irregular heartbeat.  You lose feeling (get numbness) in any part of  your body.  You cannot move your arms or your legs.  You have trouble talking.  You get sweaty or feel light-headed.  You faint.  You have trouble breathing.  You have trouble staying awake.  You feel confused. This information is not intended to replace advice given to you by your health care provider. Make sure you discuss any questions you have with your health care provider. Document Released:  08/10/2009 Document Revised: 02/02/2016 Document Reviewed: 02/02/2016 Elsevier Interactive Patient Education  2017 [employer].       Follow-up plan: Return in about 3 months (around 07/24/2018) for recheck diabetes - sooner if needed.                             ############################################ ############################################ ############################################ ############################################    Current Meds  Medication Sig  . ACCU-CHEK AVIVA PLUS test strip TEST 1-2 TIMES DAILY  . acetaminophen (TYLENOL) 500 MG tablet Take 1,000 mg by mouth every 6 (six) hours as needed for mild pain or moderate pain.  Marland Kitchen AMBULATORY NON FORMULARY MEDICATION Glucometer, test strips and lancets for testing once a day for DM, uncontrolled.  Marland Kitchen amitriptyline (ELAVIL) 50 MG tablet TAKE 1 TABLET BY MOUTH EVERY NIGHT AT BEDTIME  . aspirin EC 81 MG tablet Take 1 tablet (81 mg total) by mouth daily.  . cetirizine (ZYRTEC) 10 MG tablet Take 10 mg by mouth at bedtime.  . CHANTIX 1 MG tablet TAKE 1 TABLET BY MOUTH TWICE DAILY( EVERY TWELVE HOURS)  . CHANTIX 1 MG tablet TAKE 1 TABLET BY MOUTH TWICE DAILY( EVERY TWELVE HOURS)  . cholecalciferol (VITAMIN D) 1000 units tablet Take 1,000 Units by mouth daily.  . colchicine 0.6 MG tablet Take 1-2 tablets (0.6-1.2 mg total) by mouth daily.  . diazepam (VALIUM) 5 MG tablet Take 1 tab PO 1 hour before procedure or imaging.  . diclofenac sodium (VOLTAREN) 1 % GEL Apply 4 g topically 4 (four) times daily. To affected joint.  Marland Kitchen dicyclomine (BENTYL) 10 MG capsule Take 1 capsule (10 mg total) 2 (two) times daily by mouth.  . doxycycline (VIBRA-TABS) 100 MG tablet Take 1 tablet (100 mg total) by mouth 2 (two) times daily.  . empagliflozin (JARDIANCE) 10 MG TABS tablet Take 10 mg by mouth daily.  Marland Kitchen EPIPEN 2-PAK 0.3 MG/0.3ML SOAJ injection USE AS DIRECTED  . famotidine (PEPCID) 20 MG tablet TAKE 1  TABLET(20 MG TOTAL) BY MOUTH DAILY.  Marland Kitchen Fluticasone-Salmeterol (ADVAIR) 250-50 MCG/DOSE AEPB Inhale 1 puff into the lungs every 12 (twelve) hours.  Marland Kitchen GLUCOSAMINE-CHONDROITIN PO Take 1 tablet by mouth 2 (two) times daily.   . Insulin Degludec (TRESIBA FLEXTOUCH) 200 UNIT/ML SOPN Inject 20 Units into the skin at bedtime. Increase by 2 units every 5 days until fasting glucose 90-120.  Marland Kitchen isosorbide mononitrate (IMDUR) 30 MG 24 hr tablet Take 1 tablet (30 mg total) by mouth daily.  Marland Kitchen levocetirizine (XYZAL) 5 MG tablet Take 5 mg by mouth every evening.  Marland Kitchen levothyroxine (SYNTHROID, LEVOTHROID) 150 MCG tablet TAKE 1 TABLET BY MOUTH DAILY BEFORE BREAKFAST  . LUMIGAN 0.01 % SOLN Instill 1 drop into both eyes every night  . LYRICA 50 MG capsule TAKE ONE CAPSULE TWICE DAILY BY MOUTH  . metFORMIN (GLUCOPHAGE-XR) 750 MG 24 hr tablet TAKE 1 TABLET(750 MG TOTAL) BY MOUTH DAILY WITH BREAKFAST.  . Multiple Vitamins-Minerals (PRESERVISION AREDS 2 PO) Take 1 capsule by mouth 2 (two) times daily.  . mupirocin  ointment (BACTROBAN) 2 % Apply 1 application topically 3 (three) times daily.  . ondansetron (ZOFRAN) 8 MG tablet TAKE 1 TABLET BY MOUTH EVERY 8 HOURS AS NEEDED FOR NAUSEA AND VOMITING  . QUEtiapine (SEROQUEL) 200 MG tablet TAKE 1 TABLET(200 MG TOTAL) BY MOUTH AT BEDTIME.  . rosuvastatin (CRESTOR) 40 MG tablet Take 1 tablet (40 mg total) by mouth daily.  . sertraline (ZOLOFT) 100 MG tablet TAKE 2 TABLETS BY MOUTH DAILY  . SURE COMFORT PEN NEEDLES 32G X 4 MM MISC USE AS DIRECTED  . triamcinolone cream (KENALOG) 0.1 % Apply 1 application topically 2 (two) times daily. For itchy bumps.    Allergies  Allergen Reactions  . Bee Venom Anaphylaxis  . Iodinated Diagnostic Agents Other (See Comments)    Per patient cardiac arrest IVP DYE-CARDIAC ARREST  . Penicillins Anaphylaxis    Has patient had a PCN reaction causing immediate rash, facial/tongue/throat swelling, SOB or lightheadedness with hypotension: yes Has  patient had a PCN reaction causing severe rash involving mucus membranes or skin necrosis: no Has patient had a PCN reaction that required hospitalization: yes Has patient had a PCN reaction occurring within the last 10 years: no If all of the above answers are "NO", then may proceed with Cephalosporin use.   . Povidone Iodine Itching and Rash  . Tomato Itching  . Chocolate Rash  . Latex Rash  . Other Rash  . Atorvastatin Calcium Nausea Only  . Esomeprazole Magnesium Nausea Only  . Gluten Meal   . Metformin And Related Diarrhea  . Quetiapine Fumarate     Per pt she can not take XR  . Trazodone And Nefazodone Hives  . Zolpidem Tartrate Nausea And Vomiting       Review of Systems:  Constitutional: No recent illness  HEENT: No  headache, no vision change  Cardiac: No  chest pain, No  pressure, No palpitations  Respiratory:  No  shortness of breath. No  Cough  Gastrointestinal: No  abdominal pain  Neurologic: No  weakness, No  Vertigo/spinning   Exam:  BP 133/83 (BP Location: Left Arm, Patient Position: Sitting, Cuff Size: Normal)   Pulse 91   Wt 211 lb (95.7 kg)   LMP 09/21/1974   BMI 37.38 kg/m   Constitutional: VS see above. General Appearance: alert, well-developed, well-nourished, NAD  Eyes: Normal lids and conjunctive, non-icteric sclera  Ears, Nose, Mouth, Throat: MMM, Normal external inspection ears/nares/mouth/lips/gums.  Neck: No masses, trachea midline.   Respiratory: Normal respiratory effort. no wheeze, no rhonchi, no rales  Cardiovascular: S1/S2 normal, no rub/gallop auscultated. RRR.   Musculoskeletal: Gait normal. Symmetric and independent movement of all extremities  Neurological: Normal balance/coordination. No tremor.  Skin: warm, dry, intact.   Psychiatric: Normal judgment/insight. Normal mood and affect. Oriented x3.       Visit summary with medication list and pertinent instructions was printed for patient to review, patient was  advised to alert [place] if any updates are needed. All questions at time of visit were answered - patient instructed to contact office with any additional concerns. ER/RTC precautions were reviewed with the patient and understanding verbalized.   Please note: voice recognition software was used to produce this document, and typos may escape review. Please contact Dr. Sheppard Coil for any needed clarifications.    Follow up plan: Return in about 3 months (around 07/24/2018) for recheck diabetes - sooner if needed.

## 2018-04-24 ENCOUNTER — Ambulatory Visit: Payer: Self-pay | Admitting: Family Medicine

## 2018-05-04 ENCOUNTER — Other Ambulatory Visit: Payer: Self-pay | Admitting: Osteopathic Medicine

## 2018-05-07 DIAGNOSIS — H40053 Ocular hypertension, bilateral: Secondary | ICD-10-CM | POA: Diagnosis not present

## 2018-05-07 DIAGNOSIS — H2512 Age-related nuclear cataract, left eye: Secondary | ICD-10-CM | POA: Diagnosis not present

## 2018-05-07 DIAGNOSIS — H353132 Nonexudative age-related macular degeneration, bilateral, intermediate dry stage: Secondary | ICD-10-CM | POA: Diagnosis not present

## 2018-05-07 DIAGNOSIS — Z961 Presence of intraocular lens: Secondary | ICD-10-CM | POA: Diagnosis not present

## 2018-05-07 DIAGNOSIS — H43812 Vitreous degeneration, left eye: Secondary | ICD-10-CM | POA: Diagnosis not present

## 2018-05-08 ENCOUNTER — Other Ambulatory Visit: Payer: Self-pay | Admitting: Osteopathic Medicine

## 2018-05-08 DIAGNOSIS — G629 Polyneuropathy, unspecified: Secondary | ICD-10-CM

## 2018-05-08 NOTE — Telephone Encounter (Signed)
Pharmacy sent request to [facility]  Last written 07/21/17 for #180 with 1 RF   RX pended, please review and send if appropriate

## 2018-05-10 ENCOUNTER — Other Ambulatory Visit: Payer: Self-pay | Admitting: Osteopathic Medicine

## 2018-05-14 ENCOUNTER — Other Ambulatory Visit: Payer: Self-pay | Admitting: Osteopathic Medicine

## 2018-05-14 DIAGNOSIS — R109 Unspecified abdominal pain: Secondary | ICD-10-CM

## 2018-05-15 ENCOUNTER — Encounter: Payer: Self-pay | Admitting: *Deleted

## 2018-05-15 ENCOUNTER — Telehealth: Payer: Self-pay | Admitting: Emergency Medicine

## 2018-05-15 DIAGNOSIS — E785 Hyperlipidemia, unspecified: Principal | ICD-10-CM

## 2018-05-15 DIAGNOSIS — E1169 Type 2 diabetes mellitus with other specified complication: Secondary | ICD-10-CM

## 2018-05-15 NOTE — Telephone Encounter (Signed)
Patient calling to inquire about labs that were needed. States Dr. Stanford Breed told her she could have them drawn at the [facility] office however no one was there when she went. She would like for Dr. Jacalyn Lefevre nurse to call her back. Will route this to her.

## 2018-05-15 NOTE — Telephone Encounter (Signed)
Spoke with pt, lab orders changed to [facility]

## 2018-05-22 ENCOUNTER — Other Ambulatory Visit: Payer: Self-pay | Admitting: Family Medicine

## 2018-05-24 ENCOUNTER — Encounter: Payer: Self-pay | Admitting: Osteopathic Medicine

## 2018-05-24 ENCOUNTER — Ambulatory Visit (INDEPENDENT_AMBULATORY_CARE_PROVIDER_SITE_OTHER): Payer: Medicare HMO | Admitting: Osteopathic Medicine

## 2018-05-24 VITALS — BP 124/64 | HR 87 | Temp 98.6°F | Wt 203.2 lb

## 2018-05-24 DIAGNOSIS — I951 Orthostatic hypotension: Secondary | ICD-10-CM | POA: Diagnosis not present

## 2018-05-24 DIAGNOSIS — E039 Hypothyroidism, unspecified: Secondary | ICD-10-CM | POA: Diagnosis not present

## 2018-05-24 LAB — COMPLETE METABOLIC PANEL WITH GFR
AG Ratio: 2 (calc) (ref 1.0–2.5)
ALT: 15 U/L (ref 6–29)
AST: 23 U/L (ref 10–35)
Albumin: 4.4 g/dL (ref 3.6–5.1)
Alkaline phosphatase (APISO): 53 U/L (ref 33–130)
BUN: 16 mg/dL (ref 7–25)
CO2: 28 mmol/L (ref 20–32)
Calcium: 9.6 mg/dL (ref 8.6–10.4)
Chloride: 104 mmol/L (ref 98–110)
Creat: 0.69 mg/dL (ref 0.60–0.93)
GFR, Est African American: 100 mL/min/{1.73_m2} (ref >=60)
GFR, Est Non African American: 86 mL/min/{1.73_m2} (ref >=60)
Globulin: 2.2 g/dL (calc) (ref 1.9–3.7)
Glucose, Bld: 105 mg/dL — ABNORMAL HIGH (ref 65–99)
Potassium: 4.8 mmol/L (ref 3.5–5.3)
Sodium: 140 mmol/L (ref 135–146)
Total Bilirubin: 0.3 mg/dL (ref 0.2–1.2)
Total Protein: 6.6 g/dL (ref 6.1–8.1)

## 2018-05-24 LAB — CBC
HCT: 41.6 % (ref 35.0–45.0)
Hemoglobin: 13.3 g/dL (ref 11.7–15.5)
MCH: 28.1 pg (ref 27.0–33.0)
MCHC: 32 g/dL (ref 32.0–36.0)
MCV: 87.8 fL (ref 80.0–100.0)
MPV: 10.6 fL (ref 7.5–12.5)
Platelets: 293 10*3/uL (ref 140–400)
RBC: 4.74 10*6/uL (ref 3.80–5.10)
RDW: 13.4 % (ref 11.0–15.0)
WBC: 8.3 10*3/uL (ref 3.8–10.8)

## 2018-05-24 LAB — TSH: TSH: 2 mIU/L (ref 0.40–4.50)

## 2018-05-24 MED ORDER — AMBULATORY NON FORMULARY MEDICATION: 99 refills | Status: DC

## 2018-05-24 NOTE — Patient Instructions (Addendum)
See below for information on orthostatic hypotension.   I'll message Dr. Stanford Breed and see if he recommends any adjustment ot medications or if he wants further testing before he sees you in January.   Labs today.   See attached prescription for compression stockings. Can take to any medical supply store.      Orthostatic Hypotension Blood pressure is a measurement of how strongly, or weakly, your blood is pressing against the walls of your arteries. Orthostatic hypotension is a sudden drop in blood pressure that happens when you quickly change positions, such as when you get up from sitting or lying down. Arteries are blood vessels that carry blood from your heart throughout your body. When blood pressure is too low, you may not get enough blood to your brain or to the rest of your organs. This can cause weakness, light-headedness, rapid heartbeat, and fainting. This can last for just a few seconds or for up to a few minutes. Orthostatic hypotension is usually not a serious problem. However, if it happens frequently or gets worse, it may be a sign of something more serious. What are the causes? This condition may be caused by:  Sudden changes in posture, such as standing up quickly after you have been sitting or lying down.  Blood loss.  Loss of body fluids (dehydration).  Heart problems.  Hormone (endocrine) problems.  Pregnancy.  Severe infection.  Lack of certain nutrients.  Severe allergic reactions (anaphylaxis).  Certain medicines, such as blood pressure medicine or medicines that make the body lose excess fluids (diuretics). Sometimes, this condition can be caused by not taking medicine as directed, such as taking too much of a certain medicine. What increases the risk? The following factors may make you more likely to develop this condition:  Age. Risk increases as you get older.  Conditions that affect the heart or the central nervous system.  Taking certain  medicines, such as blood pressure medicine or diuretics.  Being pregnant. What are the signs or symptoms? Symptoms of this condition may include:  Weakness.  Light-headedness.  Dizziness.  Blurred vision.  Fatigue.  Rapid heartbeat.  Fainting, in severe cases. How is this diagnosed? This condition is diagnosed based on:  Your medical history.  Your symptoms.  Your blood pressure measurement. Your health care provider will check your blood pressure when you are: ? Lying down. ? Sitting. ? Standing. A blood pressure reading is recorded as two numbers, such as "120 over 80" (or 120/80). The first ("top") number is called the systolic pressure. It is a measure of the pressure in your arteries as your heart beats. The second ("bottom") number is called the diastolic pressure. It is a measure of the pressure in your arteries when your heart relaxes between beats. Blood pressure is measured in a unit called mm Hg. Healthy blood pressure for most adults is 120/80. If your blood pressure is below 90/60, you may be diagnosed with hypotension. Other information or tests that may be used to diagnose orthostatic hypotension include:  Your other vital signs, such as your heart rate and temperature.  Blood tests.  Tilt table test. For this test, you will be safely secured to a table that moves you from a lying position to an upright position. Your heart rhythm and blood pressure will be monitored during the test. How is this treated? This condition may be treated by:  Changing your diet. This may involve eating more salt (sodium) or drinking more water.  Taking medicines  to raise your blood pressure.  Changing the dosage of certain medicines you are taking that might be lowering your blood pressure.  Wearing compression stockings. These stockings help to prevent blood clots and reduce swelling in your legs. In some cases, you may need to go to the hospital for:  Fluid replacement.  This means you will receive fluids through an IV.  Blood replacement. This means you will receive donated blood through an IV (transfusion).  Treating an infection or heart problems, if this applies.  Monitoring. You may need to be monitored while medicines that you are taking wear off. Follow these instructions at home: Eating and drinking   Drink enough fluid to keep your urine pale yellow.  Eat a healthy diet, and follow instructions from your health care provider about eating or drinking restrictions. A healthy diet includes: ? Fresh fruits and vegetables. ? Whole grains. ? Lean meats. ? Low-fat dairy products.  Eat extra salt only as directed. Do not add extra salt to your diet unless your health care provider told you to do that.  Eat frequent, small meals.  Avoid standing up suddenly after eating. Medicines  Take over-the-counter and prescription medicines only as told by your health care provider. ? Follow instructions from your health care provider about changing the dosage of your current medicines, if this applies. ? Do not stop or adjust any of your medicines on your own. General instructions   Wear compression stockings as told by your health care provider.  Get up slowly from lying down or sitting positions. This gives your blood pressure a chance to adjust.  Avoid hot showers and excessive heat as directed by your health care provider.  Return to your normal activities as told by your health care provider. Ask your health care provider what activities are safe for you.  Do not use any products that contain nicotine or tobacco, such as cigarettes, e-cigarettes, and chewing tobacco. If you need help quitting, ask your health care provider.  Keep all follow-up visits as told by your health care provider. This is important. Contact a health care provider if you:  Vomit.  Have diarrhea.  Have a fever for more than 2-3 days.  Feel more thirsty than  usual.  Feel weak and tired. Get help right away if you:  Have chest pain.  Have a fast or irregular heartbeat.  Develop numbness in any part of your body.  Cannot move your arms or your legs.  Have trouble speaking.  Become sweaty or feel light-headed.  Faint.  Feel short of breath.  Have trouble staying awake.  Feel confused. Summary  Orthostatic hypotension is a sudden drop in blood pressure that happens when you quickly change positions.  Orthostatic hypotension is usually not a serious problem.  It is diagnosed by having your blood pressure taken lying down, sitting, and then standing.  It may be treated by changing your diet or adjusting your medicines. This information is not intended to replace advice given to you by your health care provider. Make sure you discuss any questions you have with your health care provider. Document Released: 05/06/2002 Document Revised: 11/09/2017 Document Reviewed: 11/09/2017 Elsevier Interactive Patient Education  [employer].

## 2018-05-24 NOTE — Progress Notes (Signed)
HPI: Autumn Bowen is a 72 y.o. female who  has a past medical history of Allergy, Anemia, Celiac disease, Celiac disease, COPD (chronic obstructive pulmonary disease) ([place]), Diabetes mellitus type II, Diverticulitis, Dyslipidemia, HOH (hard of hearing), Hypertension, Hypothyroidism, Sleep apnea, and Sore throat.  she presents to [facility] today, 05/24/18,  for chief complaint of:  Lightheaded, concern for low BP  Lightheaded - chronic issue for her. Orthostatic Hypotension, she doesn't really remember talking about this at last visit. She isn't checking home BP.    BP- Lying Pulse- Lying BP- Sitting Pulse- Sitting BP- Standing at 0 minutes Pulse- Standing at 0 minutes  04/23/18 1618 123/79 90 103/67 92 102/66 97      At today's visit... Past medical history, surgical history, and family history reviewed and updated as needed.  Current medication list and allergy/intolerance information reviewed and updated as needed. (See remainder of HPI, ROS, Phys Exam below)            ASSESSMENT/PLAN: The encounter diagnosis was Orthostatic hypotension.   Reiterated care plan for orthostatic hypotension   Will send note to cardiology - appreciate comment if any suggestions for meds (take off Imdur possibly, but concern for angina; could consider adding midodrine or similar?) or whether any other procedures needed though pt has L heart cath earlier this year w/ normal LVEF and essentially normal coronaries except mid-RCA   Orders Placed This Encounter  Procedures  . CBC  . COMPLETE METABOLIC PANEL WITH GFR  . TSH       Patient Instructions  See below for information on orthostatic hypotension.   I'll message Dr. Stanford Breed and see if he recommends any adjustment ot medications or if he wants further testing before he sees you in January.   Labs today.   See attached prescription for compression stockings. Can take to any medical supply  store.      Orthostatic Hypotension Blood pressure is a measurement of how strongly, or weakly, your blood is pressing against the walls of your arteries. Orthostatic hypotension is a sudden drop in blood pressure that happens when you quickly change positions, such as when you get up from sitting or lying down. Arteries are blood vessels that carry blood from your heart throughout your body. When blood pressure is too low, you may not get enough blood to your brain or to the rest of your organs. This can cause weakness, light-headedness, rapid heartbeat, and fainting. This can last for just a few seconds or for up to a few minutes. Orthostatic hypotension is usually not a serious problem. However, if it happens frequently or gets worse, it may be a sign of something more serious. What are the causes? This condition may be caused by:  Sudden changes in posture, such as standing up quickly after you have been sitting or lying down.  Blood loss.  Loss of body fluids (dehydration).  Heart problems.  Hormone (endocrine) problems.  Pregnancy.  Severe infection.  Lack of certain nutrients.  Severe allergic reactions (anaphylaxis).  Certain medicines, such as blood pressure medicine or medicines that make the body lose excess fluids (diuretics). Sometimes, this condition can be caused by not taking medicine as directed, such as taking too much of a certain medicine. What increases the risk? The following factors may make you more likely to develop this condition:  Age. Risk increases as you get older.  Conditions that affect the heart or the central nervous system.  Taking  certain medicines, such as blood pressure medicine or diuretics.  Being pregnant. What are the signs or symptoms? Symptoms of this condition may include:  Weakness.  Light-headedness.  Dizziness.  Blurred vision.  Fatigue.  Rapid heartbeat.  Fainting, in severe cases. How is this diagnosed? This  condition is diagnosed based on:  Your medical history.  Your symptoms.  Your blood pressure measurement. Your health care provider will check your blood pressure when you are: ? Lying down. ? Sitting. ? Standing. A blood pressure reading is recorded as two numbers, such as "120 over 80" (or 120/80). The first ("top") number is called the systolic pressure. It is a measure of the pressure in your arteries as your heart beats. The second ("bottom") number is called the diastolic pressure. It is a measure of the pressure in your arteries when your heart relaxes between beats. Blood pressure is measured in a unit called mm Hg. Healthy blood pressure for most adults is 120/80. If your blood pressure is below 90/60, you may be diagnosed with hypotension. Other information or tests that may be used to diagnose orthostatic hypotension include:  Your other vital signs, such as your heart rate and temperature.  Blood tests.  Tilt table test. For this test, you will be safely secured to a table that moves you from a lying position to an upright position. Your heart rhythm and blood pressure will be monitored during the test. How is this treated? This condition may be treated by:  Changing your diet. This may involve eating more salt (sodium) or drinking more water.  Taking medicines to raise your blood pressure.  Changing the dosage of certain medicines you are taking that might be lowering your blood pressure.  Wearing compression stockings. These stockings help to prevent blood clots and reduce swelling in your legs. In some cases, you may need to go to the hospital for:  Fluid replacement. This means you will receive fluids through an IV.  Blood replacement. This means you will receive donated blood through an IV (transfusion).  Treating an infection or heart problems, if this applies.  Monitoring. You may need to be monitored while medicines that you are taking wear off. Follow these  instructions at home: Eating and drinking   Drink enough fluid to keep your urine pale yellow.  Eat a healthy diet, and follow instructions from your health care provider about eating or drinking restrictions. A healthy diet includes: ? Fresh fruits and vegetables. ? Whole grains. ? Lean meats. ? Low-fat dairy products.  Eat extra salt only as directed. Do not add extra salt to your diet unless your health care provider told you to do that.  Eat frequent, small meals.  Avoid standing up suddenly after eating. Medicines  Take over-the-counter and prescription medicines only as told by your health care provider. ? Follow instructions from your health care provider about changing the dosage of your current medicines, if this applies. ? Do not stop or adjust any of your medicines on your own. General instructions   Wear compression stockings as told by your health care provider.  Get up slowly from lying down or sitting positions. This gives your blood pressure a chance to adjust.  Avoid hot showers and excessive heat as directed by your health care provider.  Return to your normal activities as told by your health care provider. Ask your health care provider what activities are safe for you.  Do not use any products that contain nicotine or tobacco,  such as cigarettes, e-cigarettes, and chewing tobacco. If you need help quitting, ask your health care provider.  Keep all follow-up visits as told by your health care provider. This is important. Contact a health care provider if you:  Vomit.  Have diarrhea.  Have a fever for more than 2-3 days.  Feel more thirsty than usual.  Feel weak and tired. Get help right away if you:  Have chest pain.  Have a fast or irregular heartbeat.  Develop numbness in any part of your body.  Cannot move your arms or your legs.  Have trouble speaking.  Become sweaty or feel light-headed.  Faint.  Feel short of breath.  Have  trouble staying awake.  Feel confused. Summary  Orthostatic hypotension is a sudden drop in blood pressure that happens when you quickly change positions.  Orthostatic hypotension is usually not a serious problem.  It is diagnosed by having your blood pressure taken lying down, sitting, and then standing.  It may be treated by changing your diet or adjusting your medicines. This information is not intended to replace advice given to you by your health care provider. Make sure you discuss any questions you have with your health care provider. Document Released: 05/06/2002 Document Revised: 11/09/2017 Document Reviewed: 11/09/2017 Elsevier Interactive Patient Education  2019 [employer].      Follow-up plan: Return for follow up A1C (diabetes check-up) around 07/19/18, or sooner if needed .                             ############################################ ############################################ ############################################ ############################################    Current Meds  Medication Sig  . ACCU-CHEK AVIVA PLUS test strip TEST 1-2 TIMES DAILY  . acetaminophen (TYLENOL) 500 MG tablet Take 1,000 mg by mouth every 6 (six) hours as needed for mild pain or moderate pain.  Marland Kitchen AMBULATORY NON FORMULARY MEDICATION Glucometer, test strips and lancets for testing once a day for DM, uncontrolled.  Marland Kitchen amitriptyline (ELAVIL) 50 MG tablet TAKE 1 TABLET BY MOUTH EVERY NIGHT AT BEDTIME  . aspirin EC 81 MG tablet Take 1 tablet (81 mg total) by mouth daily.  . BD PEN NEEDLE NANO U/F 32G X 4 MM MISC USE AS DIRECTED  . cetirizine (ZYRTEC) 10 MG tablet Take 10 mg by mouth at bedtime.  . CHANTIX 1 MG tablet TAKE 1 TABLET BY MOUTH TWICE DAILY( EVERY TWELVE HOURS)  . CHANTIX 1 MG tablet TAKE 1 TABLET BY MOUTH TWICE DAILY( EVERY TWELVE HOURS)  . cholecalciferol (VITAMIN D) 1000 units tablet Take 1,000 Units by mouth daily.  . colchicine  0.6 MG tablet Take 1-2 tablets (0.6-1.2 mg total) by mouth daily.  . diazepam (VALIUM) 5 MG tablet Take 1 tab PO 1 hour before procedure or imaging.  . diclofenac sodium (VOLTAREN) 1 % GEL Apply 4 g topically 4 (four) times daily. To affected joint.  Marland Kitchen dicyclomine (BENTYL) 10 MG capsule TAKE ONE CAPSULE BY MOUTH TWICE DAILY  . doxycycline (VIBRA-TABS) 100 MG tablet Take 1 tablet (100 mg total) by mouth 2 (two) times daily.  . empagliflozin (JARDIANCE) 10 MG TABS tablet Take 10 mg by mouth daily.  Marland Kitchen EPIPEN 2-PAK 0.3 MG/0.3ML SOAJ injection USE AS DIRECTED  . famotidine (PEPCID) 20 MG tablet TAKE 1 TABLET(20 MG TOTAL) BY MOUTH DAILY.  Marland Kitchen Fluticasone-Salmeterol (ADVAIR) 250-50 MCG/DOSE AEPB Inhale 1 puff into the lungs every 12 (twelve) hours.  Marland Kitchen GLUCOSAMINE-CHONDROITIN PO Take 1 tablet by mouth 2 (  two) times daily.   . Insulin Degludec (TRESIBA FLEXTOUCH) 200 UNIT/ML SOPN Inject 20 Units into the skin at bedtime. Increase by 2 units every 5 days until fasting glucose 90-120.  Marland Kitchen isosorbide mononitrate (IMDUR) 30 MG 24 hr tablet Take 1 tablet (30 mg total) by mouth daily.  Marland Kitchen levocetirizine (XYZAL) 5 MG tablet Take 5 mg by mouth every evening.  Marland Kitchen levothyroxine (SYNTHROID, LEVOTHROID) 150 MCG tablet TAKE 1 TABLET BY MOUTH DAILY BEFORE BREAKFAST  . LUMIGAN 0.01 % SOLN Instill 1 drop into both eyes every night  . metFORMIN (GLUCOPHAGE-XR) 750 MG 24 hr tablet TAKE 1 TABLET(750 MG TOTAL) BY MOUTH DAILY WITH BREAKFAST.  . Multiple Vitamins-Minerals (PRESERVISION AREDS 2 PO) Take 1 capsule by mouth 2 (two) times daily.  . mupirocin ointment (BACTROBAN) 2 % Apply 1 application topically 3 (three) times daily.  . ondansetron (ZOFRAN) 8 MG tablet TAKE 1 TABLET BY MOUTH EVERY 8 HOURS AS NEEDED FOR NAUSEA AND VOMITING  . pregabalin (LYRICA) 50 MG capsule TAKE ONE CAPSULE BY MOUTH TWICE DAILY  . QUEtiapine (SEROQUEL) 200 MG tablet TAKE 1 TABLET(200 MG TOTAL) BY MOUTH AT BEDTIME.  . rosuvastatin (CRESTOR) 40 MG  tablet Take 1 tablet (40 mg total) by mouth daily.  . sertraline (ZOLOFT) 100 MG tablet TAKE 2 TABLETS BY MOUTH DAILY  . triamcinolone cream (KENALOG) 0.1 % Apply 1 application topically 2 (two) times daily. For itchy bumps.    Allergies  Allergen Reactions  . Bee Venom Anaphylaxis  . Iodinated Diagnostic Agents Other (See Comments)    Per patient cardiac arrest IVP DYE-CARDIAC ARREST  . Penicillins Anaphylaxis    Has patient had a PCN reaction causing immediate rash, facial/tongue/throat swelling, SOB or lightheadedness with hypotension: yes Has patient had a PCN reaction causing severe rash involving mucus membranes or skin necrosis: no Has patient had a PCN reaction that required hospitalization: yes Has patient had a PCN reaction occurring within the last 10 years: no If all of the above answers are "NO", then may proceed with Cephalosporin use.   . Povidone Iodine Itching and Rash  . Tomato Itching  . Chocolate Rash  . Latex Rash  . Other Rash  . Atorvastatin Calcium Nausea Only  . Esomeprazole Magnesium Nausea Only  . Gluten Meal   . Metformin And Related Diarrhea  . Quetiapine Fumarate     Per pt she can not take XR  . Trazodone And Nefazodone Hives  . Zolpidem Tartrate Nausea And Vomiting       Review of Systems:  Constitutional: No recent illness  HEENT: No  headache, no vision change  Cardiac: No  chest pain, No  pressure, No palpitations  Respiratory:  No  shortness of breath. No  Cough  Gastrointestinal: No  abdominal pain  Neurologic: No  weakness, No  Dizziness   Exam:  BP 124/64 (BP Location: Left Arm, Patient Position: Sitting, Cuff Size: Normal)   Pulse 87   Temp 98.6 F (37 C) (Oral)   Wt 203 lb 3.2 oz (92.2 kg)   LMP 09/21/1974   BMI 36.00 kg/m   Constitutional: VS see above. General Appearance: alert, well-developed, well-nourished, NAD  Eyes: Normal lids and conjunctive, non-icteric sclera  Ears, Nose, Mouth, Throat: MMM, Normal  external inspection ears/nares/mouth/lips/gums.  Neck: No masses, trachea midline.   Respiratory: Normal respiratory effort. no wheeze, no rhonchi, no rales  Cardiovascular: S1/S2 normal, no murmur, no rub/gallop auscultated. RRR.   Musculoskeletal: Gait normal. Symmetric and independent movement of  all extremities  Neurological: Normal balance/coordination. No tremor.  Skin: warm, dry, intact.   Psychiatric: Normal judgment/insight. Normal mood and affect. Oriented x3.       Visit summary with medication list and pertinent instructions was printed for patient to review, patient was advised to alert [place] if any updates are needed. All questions at time of visit were answered - patient instructed to contact office with any additional concerns. ER/RTC precautions were reviewed with the patient and understanding verbalized.      Please note: voice recognition software was used to produce this document, and typos may escape review. Please contact Dr. Sheppard Coil for any needed clarifications.    Follow up plan: Return for follow up A1C (diabetes check-up) around 07/19/18, or sooner if needed .

## 2018-05-25 ENCOUNTER — Encounter: Payer: Self-pay | Admitting: Family Medicine

## 2018-05-25 ENCOUNTER — Ambulatory Visit (INDEPENDENT_AMBULATORY_CARE_PROVIDER_SITE_OTHER): Payer: Medicare HMO | Admitting: Family Medicine

## 2018-05-25 VITALS — BP 124/74 | HR 91 | Temp 97.8°F | Wt 205.8 lb

## 2018-05-25 DIAGNOSIS — M25562 Pain in left knee: Secondary | ICD-10-CM

## 2018-05-25 NOTE — Patient Instructions (Signed)
Thank you for coming in today. Call or go to the ER if you develop a large red swollen joint with extreme pain or oozing puss.  Use the compression stockings.  You can get them from  [facility] Address: [address] [place],  [address] Phone: [phone]  Recheck in 1 month.  Return sooner if needed.

## 2018-05-25 NOTE — Progress Notes (Signed)
Autumn Bowen is a 72 y.o. female who presents to [facility] today for left knee pain.  Autumn Bowen has been seen several times for left knee pain in October and November.  She had prepatellar bursitis that became infected and ultimately required incision and drainage as well as antibiotics.  She had considerable improvement in her symptoms.  However upon follow-up again in early November and then again on November 25 she developed pain in the posterior lateral aspect of the knee radiating proximally.  She had sensation of her knee giving way.  She had trials of diclofenac gel.  X-ray had not significantly changed and was significant for moderate degenerative changes.  At the last visit she was prescribed and continued on diclofenac gel as hamstring stretches and exercises.  She is here today for follow-up.    She notes that she continues to experience knee pain.  She notes pain in the superior patellar and lateral aspect of the knee.  She notes a little bit of swelling this area as well.  She denies significant swelling or pain in the anterior aspect of the knee.  No fevers or chills.  He does diclofenac gel is not very helpful   ROS as above:  Exam:  BP 124/74   Pulse 91   Temp 97.8 F (36.6 C) (Oral)   Wt 205 lb 12.8 oz (93.4 kg)   LMP 09/21/1974   BMI 36.46 kg/m  Wt Readings from Last 5 Encounters:  05/25/18 205 lb 12.8 oz (93.4 kg)  05/24/18 203 lb 3.2 oz (92.2 kg)  04/23/18 211 lb (95.7 kg)  04/23/18 211 lb (95.7 kg)  04/11/18 208 lb 14.4 oz (94.8 kg)    Gen: Well NAD HEENT: EOMI,  MMM Lungs: Normal work of breathing. CTABL Heart: RRR no MRG Abd: NABS, Soft. Nondistended, Nontender Exts: Brisk capillary refill, warm and well perfused.  Left knee well-appearing with mild effusion.  No significant erythema or tenderness at the anterior aspect of the knee. Range  of motion 0-120 degrees with retropatellar crepitations.  Stable ligamentous exam.  Lab and Radiology Results  Procedure: Real-time Ultrasound Guided Injection of left knee Device: GE Logiq E   Images permanently stored and available for review in the ultrasound unit. Verbal informed consent obtained.  Discussed risks and benefits of procedure. Warned about infection bleeding damage to structures skin hypopigmentation and fat atrophy among others. Patient expresses understanding and agreement Time-out conducted.   Noted no overlying erythema, induration, or other signs of local infection.   Skin prepped in a sterile fashion.   Local anesthesia: Topical Ethyl chloride.   With sterile technique and under real time ultrasound guidance:  40 mg of Kenalog and 3 mL of Marcaine injected easily.   Completed without difficulty   Pain immediately resolved suggesting accurate placement of the medication.   Advised to call if fevers/chills, erythema, induration, drainage, or persistent bleeding.   Images permanently stored and available for review in the ultrasound unit.  Impression: Technically successful ultrasound guided injection.       EXAM: LEFT KNEE - COMPLETE 4+ VIEW; RIGHT KNEE - 1-2 VIEW  COMPARISON:  Left knee series of November 16, 2017  FINDINGS: Right knee: The bones are subjectively adequately mineralized. There is moderate narrowing of the medial joint space. There is a prominent osteophyte arising from the articular margin of the medial tibial plateau. There is beaking of the tibial spines. No lateral  view was requested.  Left knee: The bones are subjectively adequately mineralized. There is mild narrowing of the medial joint compartment. There is beaking of the tibial spines. Tiny spurs arise from the articular margins of the patella. There is mild swelling of the subcutaneous tissues of the infrapatellar region. No acute fracture or dislocation  is observed.  IMPRESSION: Mild soft tissue swelling of the infrapatellar region of the left knee may be post traumatic. No acute fracture or dislocation. Mild degenerative change of the medial and patellofemoral compartments of the left knee.  Moderate osteoarthritic joint space loss of the medial compartment of the right knee. Mild beaking of the tibial spines.   Electronically Signed   By: David  Martinique M.D.   On: 04/04/2018 07:53  I personally (independently) visualized and performed the interpretation of the images attached in this note.    Assessment and Plan: 72 y.o. female with  Left knee pain and swelling likely due to degenerative changes seen on previous x-ray.  Doubtful for return of prepatellar bursitis.  Plan for intra-articular steroid knee injection and continued diclofenac gel and home exercise program.  Recheck in about 1 month.  Return sooner if needed.   No orders of the defined types were placed in this encounter.  No orders of the defined types were placed in this encounter.    Historical information moved to improve visibility of documentation.  Past Medical History:  Diagnosis Date  . Allergy    Notes that grass and some trees causes eyes to sting, burn, and water.  . Anemia   . Celiac disease   . Celiac disease   . COPD (chronic obstructive pulmonary disease) ([place])   . Diabetes mellitus type II   . Diverticulitis   . Dyslipidemia   . HOH (hard of hearing)   . Hypertension   . Hypothyroidism   . Sleep apnea   . Sore throat    Past Surgical History:  Procedure Laterality Date  . APPENDECTOMY    . BREAST SURGERY     h/o benign cystleft breast and lymp node removal on right breast area.  Marland Kitchen EYE SURGERY     Left eye cataract removal, right eye h/o macular degeneration.  . INTRAVASCULAR PRESSURE WIRE/FFR STUDY N/A 12/07/2017   Procedure: INTRAVASCULAR PRESSURE WIRE/FFR STUDY;  Surgeon: Leonie Man, MD;  Location: [facility];   Service: Cardiovascular;  Laterality: N/A;  . LEFT HEART CATH AND CORONARY ANGIOGRAPHY N/A 12/07/2017   Procedure: LEFT HEART CATH AND CORONARY ANGIOGRAPHY;  Surgeon: Leonie Man, MD;  Location: [facility];  Service: Cardiovascular;  Laterality: N/A;  . SPINE SURGERY     Pt not sure of the type of surgery she had but knows it was L4-5.  Marland Kitchen VAGINAL HYSTERECTOMY     Social History   Tobacco Use  . Smoking status: Current Some Day Smoker    Packs/day: 0.25    Years: 51.00    Pack years: 12.75    Types: Cigarettes    Start date: 01/02/2012  . Smokeless tobacco: Never Used  . Tobacco comment: Been down to 1 - 2 cigs a day  Substance Use Topics  . Alcohol use: No   family history includes Alcohol abuse in her brother; Anxiety disorder in her mother; Asthma in her son; COPD in her father and sister; Cancer in her sister; Depression in her mother; Diabetes in her father and son; Diabetes type II in her mother; Drug abuse in her  brother; Glaucoma in her father; Hashimoto's thyroiditis in her sister; Heart attack in her mother; Hypertension in her father; Kidney disease in her sister; Stroke in her mother.  Medications: Current Outpatient Medications  Medication Sig Dispense Refill  . ACCU-CHEK AVIVA PLUS test strip TEST 1-2 TIMES DAILY 100 each 1  . acetaminophen (TYLENOL) 500 MG tablet Take 1,000 mg by mouth every 6 (six) hours as needed for mild pain or moderate pain.    Marland Kitchen AMBULATORY NON FORMULARY MEDICATION Glucometer, test strips and lancets for testing once a day for DM, uncontrolled. 100 strip 0  . AMBULATORY NON FORMULARY MEDICATION Knee-high, medium compression, graduated compression stockings. Apply to lower extremities. Disp per patient preference and insurance coverage. 10 each prn  . amitriptyline (ELAVIL) 50 MG tablet TAKE 1 TABLET BY MOUTH EVERY NIGHT AT BEDTIME 90 tablet 3  . aspirin EC 81 MG tablet Take 1 tablet (81 mg total) by mouth daily. 90 tablet 3  . BD PEN  NEEDLE NANO U/F 32G X 4 MM MISC USE AS DIRECTED 100 each 0  . cetirizine (ZYRTEC) 10 MG tablet Take 10 mg by mouth at bedtime.    . CHANTIX 1 MG tablet TAKE 1 TABLET BY MOUTH TWICE DAILY( EVERY TWELVE HOURS) 56 tablet 0  . CHANTIX 1 MG tablet TAKE 1 TABLET BY MOUTH TWICE DAILY( EVERY TWELVE HOURS) 56 tablet 0  . cholecalciferol (VITAMIN D) 1000 units tablet Take 1,000 Units by mouth daily.    . colchicine 0.6 MG tablet Take 1-2 tablets (0.6-1.2 mg total) by mouth daily. 60 tablet 3  . diazepam (VALIUM) 5 MG tablet Take 1 tab PO 1 hour before procedure or imaging. 5 tablet 0  . diclofenac sodium (VOLTAREN) 1 % GEL Apply 4 g topically 4 (four) times daily. To affected joint. 100 g 11  . dicyclomine (BENTYL) 10 MG capsule TAKE ONE CAPSULE BY MOUTH TWICE DAILY 180 capsule 0  . doxycycline (VIBRA-TABS) 100 MG tablet Take 1 tablet (100 mg total) by mouth 2 (two) times daily. 14 tablet 0  . empagliflozin (JARDIANCE) 10 MG TABS tablet Take 10 mg by mouth daily. 90 tablet 1  . EPIPEN 2-PAK 0.3 MG/0.3ML SOAJ injection USE AS DIRECTED 1 Device 1  . famotidine (PEPCID) 20 MG tablet TAKE 1 TABLET(20 MG TOTAL) BY MOUTH DAILY. 90 tablet 0  . Fluticasone-Salmeterol (ADVAIR) 250-50 MCG/DOSE AEPB Inhale 1 puff into the lungs every 12 (twelve) hours. 60 each 11  . GLUCOSAMINE-CHONDROITIN PO Take 1 tablet by mouth 2 (two) times daily.     . Insulin Degludec (TRESIBA FLEXTOUCH) 200 UNIT/ML SOPN Inject 20 Units into the skin at bedtime. Increase by 2 units every 5 days until fasting glucose 90-120.    Marland Kitchen isosorbide mononitrate (IMDUR) 30 MG 24 hr tablet Take 1 tablet (30 mg total) by mouth daily. 90 tablet 1  . levocetirizine (XYZAL) 5 MG tablet Take 5 mg by mouth every evening.    Marland Kitchen levothyroxine (SYNTHROID, LEVOTHROID) 150 MCG tablet TAKE 1 TABLET BY MOUTH DAILY BEFORE BREAKFAST 30 tablet 0  . LUMIGAN 0.01 % SOLN Instill 1 drop into both eyes every night  3  . metFORMIN (GLUCOPHAGE-XR) 750 MG 24 hr tablet TAKE 1  TABLET(750 MG TOTAL) BY MOUTH DAILY WITH BREAKFAST. 90 tablet 0  . Multiple Vitamins-Minerals (PRESERVISION AREDS 2 PO) Take 1 capsule by mouth 2 (two) times daily.    . mupirocin ointment (BACTROBAN) 2 % Apply 1 application topically 3 (three) times daily. 30 g 0  .  ondansetron (ZOFRAN) 8 MG tablet TAKE 1 TABLET BY MOUTH EVERY 8 HOURS AS NEEDED FOR NAUSEA AND VOMITING 20 tablet 2  . pregabalin (LYRICA) 50 MG capsule TAKE ONE CAPSULE BY MOUTH TWICE DAILY 180 capsule 0  . QUEtiapine (SEROQUEL) 200 MG tablet TAKE 1 TABLET(200 MG TOTAL) BY MOUTH AT BEDTIME. 90 tablet 0  . rosuvastatin (CRESTOR) 40 MG tablet Take 1 tablet (40 mg total) by mouth daily. 90 tablet 3  . sertraline (ZOLOFT) 100 MG tablet TAKE 2 TABLETS BY MOUTH DAILY 120 tablet 0  . triamcinolone cream (KENALOG) 0.1 % Apply 1 application topically 2 (two) times daily. For itchy bumps. 60 g 1  . nitroGLYCERIN (NITROSTAT) 0.4 MG SL tablet Place 1 tablet (0.4 mg total) under the tongue every 5 (five) minutes as needed for chest pain. 25 tablet 11   No current facility-administered medications for this visit.    Allergies  Allergen Reactions  . Bee Venom Anaphylaxis  . Iodinated Diagnostic Agents Other (See Comments)    Per patient cardiac arrest IVP DYE-CARDIAC ARREST  . Penicillins Anaphylaxis    Has patient had a PCN reaction causing immediate rash, facial/tongue/throat swelling, SOB or lightheadedness with hypotension: yes Has patient had a PCN reaction causing severe rash involving mucus membranes or skin necrosis: no Has patient had a PCN reaction that required hospitalization: yes Has patient had a PCN reaction occurring within the last 10 years: no If all of the above answers are "NO", then may proceed with Cephalosporin use.   . Povidone Iodine Itching and Rash  . Tomato Itching  . Chocolate Rash  . Latex Rash  . Other Rash  . Atorvastatin Calcium Nausea Only  . Esomeprazole Magnesium Nausea Only  . Gluten Meal   .  Metformin And Related Diarrhea  . Quetiapine Fumarate     Per pt she can not take XR  . Trazodone And Nefazodone Hives  . Zolpidem Tartrate Nausea And Vomiting     Discussed warning signs or symptoms. Please see discharge instructions. Patient expresses understanding.

## 2018-06-05 ENCOUNTER — Other Ambulatory Visit: Payer: Self-pay | Admitting: Osteopathic Medicine

## 2018-06-07 DIAGNOSIS — Z961 Presence of intraocular lens: Secondary | ICD-10-CM | POA: Diagnosis not present

## 2018-06-08 ENCOUNTER — Telehealth: Payer: Self-pay | Admitting: Osteopathic Medicine

## 2018-06-08 NOTE — Telephone Encounter (Signed)
Per insurance Chantix  available without prior authorization Pharmacy is aware.

## 2018-06-11 NOTE — Progress Notes (Signed)
HPI: Follow-up coronary artery disease.  Previously followed by Dr. [name].  Carotid Doppler December 2014 showed 40 to 59% right stenosis.  Abdominal CT November 2017 showed atherosclerosis but no aneurysm.  Patient had cardiac catheterization July 2019 due to CP.  There was a 60% mid right coronary artery lesion with FFR of 0.94.  No other coronary disease noted.  Since last seen, she continues to have occasional chest pain.  She states this has been present intermittently now for 50 years.  It starts in the left axilla area and radiates to the left breast.  Occurs both with exertion and at rest.  Lasts 2 to 4 minutes and resolves.  There is no dyspnea or syncope.  Current Outpatient Medications  Medication Sig Dispense Refill  . ACCU-CHEK AVIVA PLUS test strip TEST 1-2 TIMES DAILY 100 each 1  . acetaminophen (TYLENOL) 500 MG tablet Take 1,000 mg by mouth every 6 (six) hours as needed for mild pain or moderate pain.    Marland Kitchen AMBULATORY NON FORMULARY MEDICATION Glucometer, test strips and lancets for testing once a day for DM, uncontrolled. 100 strip 0  . AMBULATORY NON FORMULARY MEDICATION Knee-high, medium compression, graduated compression stockings. Apply to lower extremities. Disp per patient preference and insurance coverage. 10 each prn  . amitriptyline (ELAVIL) 50 MG tablet TAKE 1 TABLET BY MOUTH EVERY NIGHT AT BEDTIME 90 tablet 3  . aspirin EC 81 MG tablet Take 1 tablet (81 mg total) by mouth daily. 90 tablet 3  . BD PEN NEEDLE NANO U/F 32G X 4 MM MISC USE AS DIRECTED 100 each 0  . CHANTIX 1 MG tablet TAKE 1 TABLET BY MOUTH TWICE DAILY( EVERY TWELVE HOURS) 56 tablet 0  . cholecalciferol (VITAMIN D) 1000 units tablet Take 1,000 Units by mouth daily.    . colchicine 0.6 MG tablet Take 1-2 tablets (0.6-1.2 mg total) by mouth daily. 60 tablet 3  . diazepam (VALIUM) 5 MG tablet Take 1 tab PO 1 hour before procedure or imaging. 5 tablet 0  . diclofenac sodium (VOLTAREN) 1 % GEL Apply 4 g  topically 4 (four) times daily. To affected joint. 100 g 11  . empagliflozin (JARDIANCE) 10 MG TABS tablet Take 10 mg by mouth daily. 90 tablet 1  . EPIPEN 2-PAK 0.3 MG/0.3ML SOAJ injection USE AS DIRECTED 1 Device 1  . famotidine (PEPCID) 20 MG tablet TAKE 1 TABLET(20 MG TOTAL) BY MOUTH DAILY. 90 tablet 0  . Fluticasone-Salmeterol (ADVAIR) 250-50 MCG/DOSE AEPB Inhale 1 puff into the lungs every 12 (twelve) hours. 60 each 11  . GLUCOSAMINE-CHONDROITIN PO Take 1 tablet by mouth 2 (two) times daily.     . Insulin Degludec (TRESIBA FLEXTOUCH) 200 UNIT/ML SOPN Inject 20 Units into the skin at bedtime. Increase by 2 units every 5 days until fasting glucose 90-120. (Patient taking differently: Inject 28 Units into the skin at bedtime. Increase by 2 units every 5 days until fasting glucose 90-120.)    . isosorbide mononitrate (IMDUR) 30 MG 24 hr tablet Take 1 tablet (30 mg total) by mouth daily. 90 tablet 1  . levocetirizine (XYZAL) 5 MG tablet Take 5 mg by mouth every evening.    Marland Kitchen levothyroxine (SYNTHROID, LEVOTHROID) 150 MCG tablet TAKE 1 TABLET BY MOUTH DAILY BEFORE BREAKFAST 30 tablet 0  . LUMIGAN 0.01 % SOLN Instill 1 drop into both eyes every night  3  . metFORMIN (GLUCOPHAGE-XR) 750 MG 24 hr tablet TAKE 1 TABLET(750 MG TOTAL) BY  MOUTH DAILY WITH BREAKFAST. 90 tablet 0  . Multiple Vitamins-Minerals (PRESERVISION AREDS 2 PO) Take 1 capsule by mouth 2 (two) times daily.    . nitroGLYCERIN (NITROSTAT) 0.4 MG SL tablet Place 1 tablet (0.4 mg total) under the tongue every 5 (five) minutes as needed for chest pain. 25 tablet 11  . ondansetron (ZOFRAN) 8 MG tablet TAKE 1 TABLET BY MOUTH EVERY 8 HOURS AS NEEDED FOR NAUSEA AND VOMITING 20 tablet 2  . pregabalin (LYRICA) 50 MG capsule TAKE ONE CAPSULE BY MOUTH TWICE DAILY 180 capsule 0  . QUEtiapine (SEROQUEL) 200 MG tablet TAKE 1 TABLET(200 MG TOTAL) BY MOUTH AT BEDTIME. 90 tablet 0  . rosuvastatin (CRESTOR) 40 MG tablet Take 1 tablet (40 mg total) by mouth  daily. 90 tablet 3  . sertraline (ZOLOFT) 100 MG tablet TAKE 2 TABLETS BY MOUTH DAILY 120 tablet 0  . dicyclomine (BENTYL) 10 MG capsule TAKE ONE CAPSULE BY MOUTH TWICE DAILY 180 capsule 0   No current facility-administered medications for this visit.      Past Medical History:  Diagnosis Date  . Allergy    Notes that grass and some trees causes eyes to sting, burn, and water.  . Anemia   . Celiac disease   . Celiac disease   . COPD (chronic obstructive pulmonary disease) ([place])   . Diabetes mellitus type II   . Diverticulitis   . Dyslipidemia   . HOH (hard of hearing)   . Hypertension   . Hypothyroidism   . Sleep apnea   . Sore throat     Past Surgical History:  Procedure Laterality Date  . APPENDECTOMY    . BREAST SURGERY     h/o benign cystleft breast and lymp node removal on right breast area.  Marland Kitchen EYE SURGERY     Left eye cataract removal, right eye h/o macular degeneration.  . INTRAVASCULAR PRESSURE WIRE/FFR STUDY N/A 12/07/2017   Procedure: INTRAVASCULAR PRESSURE WIRE/FFR STUDY;  Surgeon: Leonie Man, MD;  Location: [facility];  Service: Cardiovascular;  Laterality: N/A;  . LEFT HEART CATH AND CORONARY ANGIOGRAPHY N/A 12/07/2017   Procedure: LEFT HEART CATH AND CORONARY ANGIOGRAPHY;  Surgeon: Leonie Man, MD;  Location: [facility];  Service: Cardiovascular;  Laterality: N/A;  . SPINE SURGERY     Pt not sure of the type of surgery she had but knows it was L4-5.  Marland Kitchen VAGINAL HYSTERECTOMY      Social History   Socioeconomic History  . Marital status: Divorced    Spouse name: Not on file  . Number of children: Not on file  . Years of education: Not on file  . Highest education level: Not on file  Occupational History  . Not on file  Social Needs  . Financial resource strain: Not on file  . Food insecurity:    Worry: Not on file    Inability: Not on file  . Transportation needs:    Medical: Not on file    Non-medical: Not on file    Tobacco Use  . Smoking status: Current Some Day Smoker    Packs/day: 0.25    Years: 51.00    Pack years: 12.75    Types: Cigarettes    Start date: 01/02/2012  . Smokeless tobacco: Never Used  . Tobacco comment: Been down to 1 - 2 cigs a day  Substance and Sexual Activity  . Alcohol use: No  . Drug use: No  . Sexual activity:  Not Currently  Lifestyle  . Physical activity:    Days per week: Not on file    Minutes per session: Not on file  . Stress: Not on file  Relationships  . Social connections:    Talks on phone: Not on file    Gets together: Not on file    Attends religious service: Not on file    Active member of club or organization: Not on file    Attends meetings of clubs or organizations: Not on file    Relationship status: Not on file  . Intimate partner violence:    Fear of current or ex partner: Not on file    Emotionally abused: Not on file    Physically abused: Not on file    Forced sexual activity: Not on file  Other Topics Concern  . Not on file  Social History Narrative  . Not on file    Family History  Problem Relation Age of Onset  . Anxiety disorder Mother   . Depression Mother   . Heart attack Mother   . Stroke Mother   . Diabetes type II Mother   . Diabetes Father   . Glaucoma Father   . Hypertension Father   . COPD Father   . COPD Sister   . Hashimoto's thyroiditis Sister   . Cancer Sister   . Kidney disease Sister   . Drug abuse Brother   . Alcohol abuse Brother   . Asthma Son   . Diabetes Son     ROS: Neck and back pain, decreased sleep, knee pain but no fevers or chills, productive cough, hemoptysis, dysphasia, odynophagia, melena, hematochezia, dysuria, hematuria, rash, seizure activity, orthopnea, PND, pedal edema, claudication. Remaining systems are negative.  Physical Exam: Well-developed obese in no acute distress.  Skin is warm and dry.  HEENT is normal.  Neck is supple.  Chest is clear to auscultation with normal  expansion.  Cardiovascular exam is regular rate and rhythm.  Abdominal exam nontender or distended. No masses palpated. Extremities show no edema. neuro grossly intact  ECG-normal sinus rhythm with no ST changes.  Personally reviewed  A/P  1 coronary artery disease-chest pain longstanding and atypical.  Electrocardiogram with no ST changes.  Previous cardiac catheterization as outlined above.  We will continue medical therapy with aspirin and statin.  2 carotid artery disease-we will arrange follow-up carotid Dopplers.  Continue aspirin and statin.  3 hyperlipidemia-continue statin.  4 tobacco abuse-patient counseled on discontinuing.  5 hypertension-patient's blood pressure is controlled.  Continue present medications and follow.  Kirk Ruths, MD

## 2018-06-13 ENCOUNTER — Ambulatory Visit (INDEPENDENT_AMBULATORY_CARE_PROVIDER_SITE_OTHER): Payer: Medicare HMO | Admitting: Cardiology

## 2018-06-13 ENCOUNTER — Encounter: Payer: Self-pay | Admitting: Cardiology

## 2018-06-13 VITALS — BP 95/69 | HR 93 | Ht 63.0 in | Wt 206.1 lb

## 2018-06-13 DIAGNOSIS — E7800 Pure hypercholesterolemia, unspecified: Secondary | ICD-10-CM

## 2018-06-13 DIAGNOSIS — I679 Cerebrovascular disease, unspecified: Secondary | ICD-10-CM | POA: Diagnosis not present

## 2018-06-13 DIAGNOSIS — I2510 Atherosclerotic heart disease of native coronary artery without angina pectoris: Secondary | ICD-10-CM

## 2018-06-13 NOTE — Patient Instructions (Addendum)
Medication Instructions:   NO CHANGE  Testing/Procedures:  Your physician has requested that you have a carotid duplex. This test is an ultrasound of the carotid arteries in your neck. It looks at blood flow through these arteries that supply the brain with blood. Allow one hour for this exam. There are no restrictions or special instructions.  IN [place]  Follow-Up:  Your physician recommends that you schedule a follow-up appointment in: [place] 2 MONTHS PRIOR TO THAT APPOINTMENT TIME TO SCHEDULE   CALL IN MAY TO SCHEDULE APPOINTMENT IN MAY

## 2018-06-21 ENCOUNTER — Ambulatory Visit (HOSPITAL_COMMUNITY)
Admission: RE | Admit: 2018-06-21 | Discharge: 2018-06-21 | Disposition: A | Discharge status: Home / Self Care | Payer: Medicare HMO | Source: Ambulatory Visit | Attending: Cardiovascular Disease | Admitting: Cardiovascular Disease

## 2018-06-21 DIAGNOSIS — I679 Cerebrovascular disease, unspecified: Secondary | ICD-10-CM

## 2018-06-22 ENCOUNTER — Telehealth: Payer: Self-pay | Admitting: Cardiology

## 2018-06-22 NOTE — Telephone Encounter (Signed)
Per pt call she needs a call back about her Vascular test.  Patient is very upset.

## 2018-06-22 NOTE — Telephone Encounter (Signed)
Minimal plaque and no need to change fu but will see sooner if pt desires Kirk Ruths

## 2018-06-22 NOTE — Telephone Encounter (Signed)
Patient called upset, because she states at her [place], she was told that the "blockage" was the same as before in 2014. Patient states that she was never told about this, and is now concerned that she should not wait a year before seeing Dr.Crenshaw, but if he states she can wait then she will. Patient just wanted him to be aware that she knew nothing about this. Although this test showed no significant stenosis, she would still like to have him advise.  Thank you!

## 2018-06-25 ENCOUNTER — Encounter: Payer: Self-pay | Admitting: Family Medicine

## 2018-06-25 ENCOUNTER — Telehealth: Payer: Self-pay

## 2018-06-25 ENCOUNTER — Ambulatory Visit (INDEPENDENT_AMBULATORY_CARE_PROVIDER_SITE_OTHER): Payer: Medicare HMO | Admitting: Family Medicine

## 2018-06-25 VITALS — BP 140/64 | HR 93 | Ht 63.0 in | Wt 205.0 lb

## 2018-06-25 DIAGNOSIS — M542 Cervicalgia: Secondary | ICD-10-CM | POA: Diagnosis not present

## 2018-06-25 DIAGNOSIS — M25562 Pain in left knee: Secondary | ICD-10-CM | POA: Diagnosis not present

## 2018-06-25 MED ORDER — FAMOTIDINE 20 MG PO TABS: ORAL_TABLET | ORAL | 0 refills | Status: DC

## 2018-06-25 MED ORDER — FAMOTIDINE 20 MG PO TABS: ORAL_TABLET | ORAL | 1 refills | Status: DC

## 2018-06-25 MED ORDER — VARENICLINE TARTRATE 1 MG PO TABS: ORAL_TABLET | ORAL | 0 refills | Status: DC

## 2018-06-25 MED ORDER — ISOSORBIDE MONONITRATE ER 30 MG PO TB24: 30.0000 mg | ORAL_TABLET | Freq: Every day | ORAL | 1 refills | Status: DC

## 2018-06-25 MED ORDER — METFORMIN HCL ER 750 MG PO TB24: ORAL_TABLET | ORAL | 1 refills | Status: DC

## 2018-06-25 MED ORDER — VARENICLINE TARTRATE 1 MG PO TABS: ORAL_TABLET | ORAL | 3 refills | Status: DC

## 2018-06-25 MED ORDER — LEVOTHYROXINE SODIUM 150 MCG PO TABS: ORAL_TABLET | ORAL | 1 refills | Status: DC

## 2018-06-25 NOTE — Patient Instructions (Addendum)
Thank you for coming in today. Come back or go to the emergency room if you notice new weakness new numbness problems walking or bowel or bladder problems.  Lets do some PT.   Follow up with Dr Sheppard Coil.   Recheck with me in 2 months.   Recheck sooner if needed.

## 2018-06-25 NOTE — Telephone Encounter (Signed)
Patient request refills of medication. Rhonda Cunningham,CMA

## 2018-06-25 NOTE — Progress Notes (Signed)
ZALEAH TERNES is a 73 y.o. female who presents to [place] today for follow-up left knee pain and to also discuss neck pain.  [name] has been seen several times for knee pain.  About a month ago she received a steroid injection in her left knee and notes significant improvement in pain.  She notes that she is nearly pain-free and is happy with how things are going.  She denies significant swelling locking or catching.  Additionally she notes however significant pain in her right lateral neck.  This is been ongoing for over a year and is occurred without injury.  She is had some evaluation in 2019 with an MRI of her cervical spine and it did show some degenerative changes of the facets and some neuroforaminal impingement on the right at C3-4 and C4-5.  In the remote past she has had trials of physical therapy that did not help much.  However she is not had trials of physical therapy anytime recently.  She has a history of significant allergic reaction to IVP dye.  She successfully has had a heart catheterization following steroid and Benadryl administration.    ROS:  As above  Exam:  BP 140/64   Pulse 93   Ht 5' 3"  (1.6 m)   Wt 205 lb (93 kg)   LMP 09/21/1974   BMI 36.31 kg/m  Wt Readings from Last 5 Encounters:  06/25/18 205 lb (93 kg)  06/13/18 206 lb 1.9 oz (93.5 kg)  05/25/18 205 lb 12.8 oz (93.4 kg)  05/24/18 203 lb 3.2 oz (92.2 kg)  04/23/18 211 lb (95.7 kg)   General: Well Developed, well nourished, and in no acute distress.  Neuro/Psych: Alert and oriented x3, extra-ocular muscles intact, able to move all 4 extremities, sensation grossly intact. Skin: Warm and dry, no rashes noted.  Respiratory: Not using accessory muscles, speaking in full sentences, trachea midline.  Cardiovascular: Pulses palpable, no extremity edema. Abdomen: Does not appear distended. MSK:  C-spine: Nontender to midline.  Tender palpation right cervical  paraspinal musculature and trapezius. Normal neck extension and flexion.  Normal left lateral flexion and rotation.  Limited right lateral flexion and rotation with pain. Upper extremity strength reflexes and sensation are equal and normal throughout.  Left knee normal-appearing normal motion nontender.    Lab and Radiology Results EXAM: MRI CERVICAL SPINE WITHOUT CONTRAST  TECHNIQUE: Multiplanar, multisequence MR imaging of the cervical spine was performed. No intravenous contrast was administered.  COMPARISON:  Radiography of the cervical spine 11/16/2017  FINDINGS: Alignment: 3 mm of C5-6 anterolisthesis, facet mediated  Vertebrae: Mild marrow edema about the right C3-4 degenerated facet. No fracture, discitis, or aggressive bone lesion.  Cord: Normal signal and morphology as permitted by motion artifact  Posterior Fossa, vertebral arteries, paraspinal tissues: Negative  Disc levels:  C2-3: Unremarkable.  C3-4: Bulky right-sided facet spurring. Asymmetric right uncovertebral spurring. Advanced right foraminal stenosis. Likely mild left foraminal narrowing. Mild right spinal canal stenosis.  C4-5: Bulky right facet spurring. Mild right uncovertebral spurring. Right foraminal impingement. Patent canal  C5-6: Degenerative facet spurring bulkier on the right with anterolisthesis. Mild disc narrowing. Patent canal and foramina  C6-7: Disc narrowing with central protrusion contacting the ventral cord without compression. Facet spurring with mild bilateral foraminal narrowing.  C7-T1:Minor facet spurring.  No herniation or impingement  Motion degraded exam, the patient is claustrophobic  IMPRESSION: 1. Bulky asymmetric right cervical facet arthropathy with C5-6 anterolisthesis and mild active facet  arthritis on the right at C3-4. 2. C6-7 central disc protrusion. 3. Notable foraminal impingement on the right at C3-4 and C4-5.   Electronically  Signed   By: Monte Fantasia M.D.   On: 12/25/2017 14:17 I personally (independently) visualized and performed the interpretation of the images attached in this note.     Assessment and Plan: 73 y.o. female with  Left knee pain: Doing well 1 month status post steroid injection.  Watchful waiting recheck as needed.  Neck pain: Majority of pain seems to be localized into the lateral neck on the right side without a whole lot of radicular component.  Pain certainly could be myofascial and related or certainly could be due to the facet degenerative changes seen at C5-6.  Plan for trial of physical therapy.  Patient could theoretically have epidural steroid injection or facet injection however she is high risk based on her IVP dye allergy.  Recheck 2 months.  Chronic medications refilled.  Patient due for follow-up with PCP soon.  Orders Placed This Encounter  Procedures  . Ambulatory referral to Physical Therapy    Referral Priority:   Routine    Referral Type:   Physical Medicine    Referral Reason:   Specialty Services Required    Requested Specialty:   Physical Therapy    Number of Visits Requested:   1   Meds ordered this encounter  Medications  . varenicline (CHANTIX) 1 MG tablet    Sig: TAKE 1 TABLET BY MOUTH TWICE DAILY( EVERY TWELVE HOURS)    Dispense:  56 tablet    Refill:  3  . levothyroxine (SYNTHROID, LEVOTHROID) 150 MCG tablet    Sig: TAKE 1 TABLET BY MOUTH DAILY BEFORE BREAKFAST    Dispense:  90 tablet    Refill:  1  . famotidine (PEPCID) 20 MG tablet    Sig: TAKE 1 TABLET(20 MG TOTAL) BY MOUTH DAILY.    Dispense:  90 tablet    Refill:  1  . metFORMIN (GLUCOPHAGE-XR) 750 MG 24 hr tablet    Sig: TAKE 1 TABLET(750 MG TOTAL) BY MOUTH DAILY WITH BREAKFAST.    Dispense:  90 tablet    Refill:  1    Historical information moved to improve visibility of documentation.  Past Medical History:  Diagnosis Date  . Allergy    Notes that grass and some trees causes eyes  to sting, burn, and water.  . Anemia   . Celiac disease   . Celiac disease   . COPD (chronic obstructive pulmonary disease) ([place])   . Diabetes mellitus type II   . Diverticulitis   . Dyslipidemia   . HOH (hard of hearing)   . Hypertension   . Hypothyroidism   . Sleep apnea   . Sore throat    Past Surgical History:  Procedure Laterality Date  . APPENDECTOMY    . BREAST SURGERY     h/o benign cystleft breast and lymp node removal on right breast area.  Marland Kitchen EYE SURGERY     Left eye cataract removal, right eye h/o macular degeneration.  . INTRAVASCULAR PRESSURE WIRE/FFR STUDY N/A 12/07/2017   Procedure: INTRAVASCULAR PRESSURE WIRE/FFR STUDY;  Surgeon: Leonie Man, MD;  Location: [facility];  Service: Cardiovascular;  Laterality: N/A;  . LEFT HEART CATH AND CORONARY ANGIOGRAPHY N/A 12/07/2017   Procedure: LEFT HEART CATH AND CORONARY ANGIOGRAPHY;  Surgeon: Leonie Man, MD;  Location: [facility];  Service: Cardiovascular;  Laterality: N/A;  .  SPINE SURGERY     Pt not sure of the type of surgery she had but knows it was L4-5.  Marland Kitchen VAGINAL HYSTERECTOMY     Social History   Tobacco Use  . Smoking status: Current Some Day Smoker    Packs/day: 0.25    Years: 51.00    Pack years: 12.75    Types: Cigarettes    Start date: 01/02/2012  . Smokeless tobacco: Never Used  . Tobacco comment: Been down to 1 - 2 cigs a day  Substance Use Topics  . Alcohol use: No   family history includes Alcohol abuse in her brother; Anxiety disorder in her mother; Asthma in her son; COPD in her father and sister; Cancer in her sister; Depression in her mother; Diabetes in her father and son; Diabetes type II in her mother; Drug abuse in her brother; Glaucoma in her father; Hashimoto's thyroiditis in her sister; Heart attack in her mother; Hypertension in her father; Kidney disease in her sister; Stroke in her mother.  Medications: Current Outpatient Medications  Medication Sig Dispense  Refill  . ACCU-CHEK AVIVA PLUS test strip TEST 1-2 TIMES DAILY 100 each 1  . acetaminophen (TYLENOL) 500 MG tablet Take 1,000 mg by mouth every 6 (six) hours as needed for mild pain or moderate pain.    Marland Kitchen AMBULATORY NON FORMULARY MEDICATION Glucometer, test strips and lancets for testing once a day for DM, uncontrolled. 100 strip 0  . AMBULATORY NON FORMULARY MEDICATION Knee-high, medium compression, graduated compression stockings. Apply to lower extremities. Disp per patient preference and insurance coverage. 10 each prn  . amitriptyline (ELAVIL) 50 MG tablet TAKE 1 TABLET BY MOUTH EVERY NIGHT AT BEDTIME 90 tablet 3  . aspirin EC 81 MG tablet Take 1 tablet (81 mg total) by mouth daily. 90 tablet 3  . BD PEN NEEDLE NANO U/F 32G X 4 MM MISC USE AS DIRECTED 100 each 0  . cholecalciferol (VITAMIN D) 1000 units tablet Take 1,000 Units by mouth daily.    . colchicine 0.6 MG tablet Take 1-2 tablets (0.6-1.2 mg total) by mouth daily. 60 tablet 3  . diazepam (VALIUM) 5 MG tablet Take 1 tab PO 1 hour before procedure or imaging. 5 tablet 0  . diclofenac sodium (VOLTAREN) 1 % GEL Apply 4 g topically 4 (four) times daily. To affected joint. 100 g 11  . dicyclomine (BENTYL) 10 MG capsule TAKE ONE CAPSULE BY MOUTH TWICE DAILY 180 capsule 0  . empagliflozin (JARDIANCE) 10 MG TABS tablet Take 10 mg by mouth daily. 90 tablet 1  . EPIPEN 2-PAK 0.3 MG/0.3ML SOAJ injection USE AS DIRECTED 1 Device 1  . Fluticasone-Salmeterol (ADVAIR) 250-50 MCG/DOSE AEPB Inhale 1 puff into the lungs every 12 (twelve) hours. 60 each 11  . GLUCOSAMINE-CHONDROITIN PO Take 1 tablet by mouth 2 (two) times daily.     . Insulin Degludec (TRESIBA FLEXTOUCH) 200 UNIT/ML SOPN Inject 20 Units into the skin at bedtime. Increase by 2 units every 5 days until fasting glucose 90-120. (Patient taking differently: Inject 28 Units into the skin at bedtime. Increase by 2 units every 5 days until fasting glucose 90-120.)    . levocetirizine (XYZAL) 5  MG tablet Take 5 mg by mouth every evening.    Marland Kitchen levothyroxine (SYNTHROID, LEVOTHROID) 150 MCG tablet TAKE 1 TABLET BY MOUTH DAILY BEFORE BREAKFAST 90 tablet 1  . LUMIGAN 0.01 % SOLN Instill 1 drop into both eyes every night  3  . metFORMIN (GLUCOPHAGE-XR) 750 MG  24 hr tablet TAKE 1 TABLET(750 MG TOTAL) BY MOUTH DAILY WITH BREAKFAST. 90 tablet 1  . Multiple Vitamins-Minerals (PRESERVISION AREDS 2 PO) Take 1 capsule by mouth 2 (two) times daily.    . ondansetron (ZOFRAN) 8 MG tablet TAKE 1 TABLET BY MOUTH EVERY 8 HOURS AS NEEDED FOR NAUSEA AND VOMITING 20 tablet 2  . pregabalin (LYRICA) 50 MG capsule TAKE ONE CAPSULE BY MOUTH TWICE DAILY 180 capsule 0  . QUEtiapine (SEROQUEL) 200 MG tablet TAKE 1 TABLET(200 MG TOTAL) BY MOUTH AT BEDTIME. 90 tablet 0  . sertraline (ZOLOFT) 100 MG tablet TAKE 2 TABLETS BY MOUTH DAILY 120 tablet 0  . famotidine (PEPCID) 20 MG tablet TAKE 1 TABLET(20 MG TOTAL) BY MOUTH DAILY. 90 tablet 1  . isosorbide mononitrate (IMDUR) 30 MG 24 hr tablet Take 1 tablet (30 mg total) by mouth daily. 90 tablet 1  . nitroGLYCERIN (NITROSTAT) 0.4 MG SL tablet Place 1 tablet (0.4 mg total) under the tongue every 5 (five) minutes as needed for chest pain. 25 tablet 11  . rosuvastatin (CRESTOR) 40 MG tablet Take 1 tablet (40 mg total) by mouth daily. 90 tablet 3  . varenicline (CHANTIX) 1 MG tablet TAKE 1 TABLET BY MOUTH TWICE DAILY( EVERY TWELVE HOURS) 56 tablet 3   No current facility-administered medications for this visit.    Allergies  Allergen Reactions  . Bee Venom Anaphylaxis  . Iodinated Diagnostic Agents Other (See Comments)    Per patient cardiac arrest IVP DYE-CARDIAC ARREST  . Penicillins Anaphylaxis    Has patient had a PCN reaction causing immediate rash, facial/tongue/throat swelling, SOB or lightheadedness with hypotension: yes Has patient had a PCN reaction causing severe rash involving mucus membranes or skin necrosis: no Has patient had a PCN reaction that required  hospitalization: yes Has patient had a PCN reaction occurring within the last 10 years: no If all of the above answers are "NO", then may proceed with Cephalosporin use.   . Povidone Iodine Itching and Rash  . Tomato Itching  . Chocolate Rash  . Latex Rash  . Other Rash  . Atorvastatin Calcium Nausea Only  . Esomeprazole Magnesium Nausea Only  . Gluten Meal   . Metformin And Related Diarrhea  . Quetiapine Fumarate     Per pt she can not take XR  . Trazodone And Nefazodone Hives  . Zolpidem Tartrate Nausea And Vomiting      Discussed warning signs or symptoms. Please see discharge instructions. Patient expresses understanding.

## 2018-06-25 NOTE — Telephone Encounter (Signed)
Called patient, LVM advising of note from MD. Left call back number if she would like to make an appointment, or call to discuss any questions.

## 2018-06-26 ENCOUNTER — Ambulatory Visit: Payer: Self-pay | Admitting: Osteopathic Medicine

## 2018-06-26 ENCOUNTER — Telehealth: Payer: Self-pay | Admitting: Osteopathic Medicine

## 2018-06-26 NOTE — Telephone Encounter (Signed)
No PA needed for Chantix per [facility].

## 2018-06-26 NOTE — Telephone Encounter (Signed)
Patient called to cancel appointment due to issues with dog. Did not want to reschedule at this time and no further questions.

## 2018-06-28 ENCOUNTER — Ambulatory Visit: Payer: Self-pay | Admitting: Physical Therapy

## 2018-07-02 ENCOUNTER — Other Ambulatory Visit: Payer: Self-pay | Admitting: Osteopathic Medicine

## 2018-07-10 DIAGNOSIS — H538 Other visual disturbances: Secondary | ICD-10-CM | POA: Diagnosis not present

## 2018-07-10 DIAGNOSIS — H2512 Age-related nuclear cataract, left eye: Secondary | ICD-10-CM | POA: Diagnosis not present

## 2018-07-10 DIAGNOSIS — H26491 Other secondary cataract, right eye: Secondary | ICD-10-CM | POA: Diagnosis not present

## 2018-07-10 DIAGNOSIS — H43812 Vitreous degeneration, left eye: Secondary | ICD-10-CM | POA: Diagnosis not present

## 2018-07-10 DIAGNOSIS — H353132 Nonexudative age-related macular degeneration, bilateral, intermediate dry stage: Secondary | ICD-10-CM | POA: Diagnosis not present

## 2018-07-10 DIAGNOSIS — Z961 Presence of intraocular lens: Secondary | ICD-10-CM | POA: Diagnosis not present

## 2018-07-10 DIAGNOSIS — H40053 Ocular hypertension, bilateral: Secondary | ICD-10-CM | POA: Diagnosis not present

## 2018-07-20 ENCOUNTER — Ambulatory Visit: Payer: Self-pay | Admitting: Osteopathic Medicine

## 2018-07-22 ENCOUNTER — Other Ambulatory Visit: Payer: Self-pay | Admitting: Osteopathic Medicine

## 2018-07-23 ENCOUNTER — Ambulatory Visit (INDEPENDENT_AMBULATORY_CARE_PROVIDER_SITE_OTHER): Payer: Medicare HMO | Admitting: Osteopathic Medicine

## 2018-07-23 ENCOUNTER — Encounter: Payer: Self-pay | Admitting: Osteopathic Medicine

## 2018-07-23 VITALS — BP 127/80 | HR 109 | Temp 97.4°F | Wt 209.0 lb

## 2018-07-23 DIAGNOSIS — R05 Cough: Secondary | ICD-10-CM | POA: Diagnosis not present

## 2018-07-23 DIAGNOSIS — J984 Other disorders of lung: Secondary | ICD-10-CM | POA: Diagnosis not present

## 2018-07-23 DIAGNOSIS — J449 Chronic obstructive pulmonary disease, unspecified: Secondary | ICD-10-CM | POA: Diagnosis not present

## 2018-07-23 DIAGNOSIS — Z888 Allergy status to other drugs, medicaments and biological substances status: Secondary | ICD-10-CM | POA: Diagnosis not present

## 2018-07-23 DIAGNOSIS — E785 Hyperlipidemia, unspecified: Secondary | ICD-10-CM | POA: Diagnosis not present

## 2018-07-23 DIAGNOSIS — N898 Other specified noninflammatory disorders of vagina: Secondary | ICD-10-CM

## 2018-07-23 DIAGNOSIS — I10 Essential (primary) hypertension: Secondary | ICD-10-CM | POA: Diagnosis not present

## 2018-07-23 DIAGNOSIS — J439 Emphysema, unspecified: Secondary | ICD-10-CM | POA: Diagnosis not present

## 2018-07-23 DIAGNOSIS — J441 Chronic obstructive pulmonary disease with (acute) exacerbation: Secondary | ICD-10-CM | POA: Diagnosis not present

## 2018-07-23 DIAGNOSIS — Z794 Long term (current) use of insulin: Secondary | ICD-10-CM | POA: Diagnosis not present

## 2018-07-23 DIAGNOSIS — R001 Bradycardia, unspecified: Secondary | ICD-10-CM | POA: Diagnosis not present

## 2018-07-23 DIAGNOSIS — R079 Chest pain, unspecified: Secondary | ICD-10-CM | POA: Diagnosis not present

## 2018-07-23 DIAGNOSIS — E118 Type 2 diabetes mellitus with unspecified complications: Secondary | ICD-10-CM | POA: Diagnosis not present

## 2018-07-23 DIAGNOSIS — Z91030 Bee allergy status: Secondary | ICD-10-CM | POA: Diagnosis not present

## 2018-07-23 DIAGNOSIS — E119 Type 2 diabetes mellitus without complications: Secondary | ICD-10-CM | POA: Diagnosis not present

## 2018-07-23 DIAGNOSIS — E079 Disorder of thyroid, unspecified: Secondary | ICD-10-CM | POA: Diagnosis not present

## 2018-07-23 DIAGNOSIS — Z91040 Latex allergy status: Secondary | ICD-10-CM | POA: Diagnosis not present

## 2018-07-23 DIAGNOSIS — I959 Hypotension, unspecified: Secondary | ICD-10-CM | POA: Diagnosis not present

## 2018-07-23 DIAGNOSIS — R918 Other nonspecific abnormal finding of lung field: Secondary | ICD-10-CM | POA: Diagnosis not present

## 2018-07-23 DIAGNOSIS — G894 Chronic pain syndrome: Secondary | ICD-10-CM | POA: Diagnosis not present

## 2018-07-23 DIAGNOSIS — R0602 Shortness of breath: Secondary | ICD-10-CM | POA: Diagnosis not present

## 2018-07-23 DIAGNOSIS — Z880 Allergy status to penicillin: Secondary | ICD-10-CM | POA: Diagnosis not present

## 2018-07-23 LAB — POCT GLYCOSYLATED HEMOGLOBIN (HGB A1C)

## 2018-07-23 MED ORDER — SODIUM CHLORIDE 0.9 % IV SOLN
10.00 | INTRAVENOUS | Status: DC
Start: ? — End: 2018-07-23

## 2018-07-23 MED ORDER — FLUCONAZOLE 150 MG PO TABS: 150.0000 mg | ORAL_TABLET | Freq: Once | ORAL | 1 refills | Status: AC

## 2018-07-23 MED ORDER — LEVOTHYROXINE SODIUM 150 MCG PO TABS: ORAL_TABLET | ORAL | 3 refills | Status: DC

## 2018-07-23 MED ORDER — METFORMIN HCL ER 750 MG PO TB24: ORAL_TABLET | ORAL | 3 refills | Status: DC

## 2018-07-23 MED ORDER — ALBUTEROL SULFATE HFA 108 (90 BASE) MCG/ACT IN AERS: 2.0000 | INHALATION_SPRAY | RESPIRATORY_TRACT | 11 refills | Status: DC | PRN

## 2018-07-23 MED ORDER — FAMOTIDINE 20 MG PO TABS: ORAL_TABLET | ORAL | 3 refills | Status: DC

## 2018-07-23 MED ORDER — GENERIC EXTERNAL MEDICATION
10.00 | Status: DC
Start: ? — End: 2018-07-23

## 2018-07-23 MED ORDER — NICOTINE POLACRILEX 2 MG MT GUM: 2.0000 mg | CHEWING_GUM | OROMUCOSAL | 99 refills | Status: DC | PRN

## 2018-07-23 MED ORDER — ISOSORBIDE MONONITRATE ER 30 MG PO TB24: 30.0000 mg | ORAL_TABLET | Freq: Every day | ORAL | 3 refills | Status: DC

## 2018-07-23 MED ORDER — GUAIFENESIN ER 1200 MG PO TB12: 1200.0000 mg | ORAL_TABLET | Freq: Two times a day (BID) | ORAL | 99 refills | Status: DC | PRN

## 2018-07-23 NOTE — Progress Notes (Signed)
HPI: Autumn Bowen is a 73 y.o. female who  has a past medical history of Allergy, Anemia, Celiac disease, Celiac disease, COPD (chronic obstructive pulmonary disease) ([place]), Diabetes mellitus type II, Diverticulitis, Dyslipidemia, HOH (hard of hearing), Hypertension, Hypothyroidism, Sleep apnea, and Sore throat.  she presents to [facility] today, 07/23/18,  for chief complaint of:  A1C recheck  Cough/breathing - ER follow-up   Due for diabetes follow-up, hemoglobin A1c at last check November 2019 was 7.6.  She is taking 28 units of Tresiba in the evenings, she reports fasting blood sugar typically around 100s.  A1c today in the office on point-of-care machine was too high to read, patient declined blood draw since she was just in the emergency department last night  COPD/cough: Seen last night in ER after waking up feeling short of breath/choking.  Vital signs were okay, chest x-ray showed bilateral atelectasis, no concerning infiltrate.  She was not discharged on any medications, but was instead told to follow-up here.  She states a breathing treatment last night in the ER helped, she is compliant with home Advair twice daily but thinks she has been out of her albuterol or this is old.  She is also been taking Mucinex over-the-counter.  She is down to 4 to 6 cigarettes a day, at heaviest smoking was 1-1/2 packs/day.  Troponins were negative and BNP was normal in the ER.  Also reports concerning vaginal discharge, no bleeding.  ER tested for gonorrhea/chlamydia but does not look like they her home on any other medications.  She reports vaginal itching.  Wet prep and ER was negative for clue cells, yeast, trichomonas.  Positive for moderate WBC.     At today's visit 07/23/18 ... PMH, PSH, FH reviewed and updated as needed.  Current medication list and allergy/intolerance hx reviewed and updated as needed. (See remainder of HPI, ROS, Phys Exam  below)         ASSESSMENT/PLAN: The primary encounter diagnosis was Type 2 diabetes mellitus with complication, with long-term current use of insulin ([place]). Diagnoses of Chronic pain syndrome, COPD (chronic obstructive pulmonary disease) with chronic bronchitis ([place]), and Vaginal discharge were also pertinent to this visit.  Lungs sound clear, I think patient may be woke up with a choking sensation/phlegm, I am not convinced that there is a true COPD exacerbation as she reports breathing is okay now.  I do not see the need for steroid burst.  We will go ahead and refill rescue inhaler, prescription for Mucinex.   Orders Placed This Encounter  Procedures  . Hemoglobin A1c  . POCT HgB A1C    Patient Instructions  Plan:  For the sugars, let's see you back in a week or 2, please bring your home blood sugar readings with you.  We may need to repeat the A1c since it was either too elevated for our office machine here, or our machine may be in need of repair.  I sent refills of the following:   Famotidine  Isosorbide  Levothyroxine  metformin.  I sent new prescriptions for the following:  nicotine gum to help with cravings  Diflucan to help with vaginal discharge/possible yeast infection  Albuterol rescue inhaler  Mucinex which may or may not be covered by your insurance given that it is available over-the-counter       Meds ordered this encounter  Medications  . albuterol (PROVENTIL HFA;VENTOLIN HFA) 108 (90 Base) MCG/ACT inhaler    Sig: Inhale 2 puffs  into the lungs every 4 (four) hours as needed for wheezing.    Dispense:  2 Inhaler    Refill:  11  . fluconazole (DIFLUCAN) 150 MG tablet    Sig: Take 1 tablet (150 mg total) by mouth once for 1 dose. Repeat dose 72 hours if yeast infection persists    Dispense:  2 tablet    Refill:  1  . famotidine (PEPCID) 20 MG tablet    Sig: TAKE 1 TABLET(20 MG TOTAL) BY MOUTH DAILY.    Dispense:  90 tablet    Refill:  3  .  isosorbide mononitrate (IMDUR) 30 MG 24 hr tablet    Sig: Take 1 tablet (30 mg total) by mouth daily.    Dispense:  90 tablet    Refill:  3  . levothyroxine (SYNTHROID, LEVOTHROID) 150 MCG tablet    Sig: TAKE 1 TABLET BY MOUTH DAILY BEFORE BREAKFAST    Dispense:  90 tablet    Refill:  3  . metFORMIN (GLUCOPHAGE-XR) 750 MG 24 hr tablet    Sig: TAKE 1 TABLET(750 MG TOTAL) BY MOUTH DAILY WITH BREAKFAST.    Dispense:  90 tablet    Refill:  3  . Guaifenesin 1200 MG TB12    Sig: Take 1 tablet (1,200 mg total) by mouth 2 (two) times daily as needed for up to 30 days (coughing).    Dispense:  60 tablet    Refill:  prn  . nicotine polacrilex ([employer] NICOTINE) 2 MG gum    Sig: Take 1 each (2 mg total) by mouth every 4 (four) hours as needed for smoking cessation ((bite gum and place between gums and cheek as needed for cigarette cravings)).    Dispense:  100 tablet    Refill:  99        Follow-up plan: Return in about 1 week (around 07/30/2018) for recheck vaginal discharge if needed, try again with A1C recheck .                                                 ################################################# ################################################# ################################################# #################################################    No outpatient medications have been marked as taking for the 07/23/18 encounter (Office Visit) with Emeterio Reeve, DO.    Allergies  Allergen Reactions  . Bee Venom Anaphylaxis  . Iodinated Diagnostic Agents Other (See Comments)    Per patient cardiac arrest IVP DYE-CARDIAC ARREST  . Penicillins Anaphylaxis    Has patient had a PCN reaction causing immediate rash, facial/tongue/throat swelling, SOB or lightheadedness with hypotension: yes Has patient had a PCN reaction causing severe rash involving mucus membranes or skin necrosis: no Has patient had a PCN reaction that required  hospitalization: yes Has patient had a PCN reaction occurring within the last 10 years: no If all of the above answers are "NO", then may proceed with Cephalosporin use.   . Povidone Iodine Itching and Rash  . Tomato Itching  . Chocolate Rash  . Latex Rash  . Other Rash  . Atorvastatin Calcium Nausea Only  . Esomeprazole Magnesium Nausea Only  . Gluten Meal   . Metformin And Related Diarrhea  . Quetiapine Fumarate     Per pt she can not take XR  . Trazodone And Nefazodone Hives  . Zolpidem Tartrate Nausea And Vomiting       Review of Systems:  Constitutional: +  recent illness  HEENT: No  headache  Cardiac: No  chest pain, No  pressure, No palpitations  Respiratory:  No  shortness of breath. +Cough  Gastrointestinal: No  abdominal pain  Musculoskeletal: No new myalgia/arthralgia  Neurologic: No  weakness, No  Dizziness  Psychiatric: No  concerns with depression, No  concerns with anxiety  Exam:  BP 127/80   Pulse (!) 109   Temp (!) 97.4 F (36.3 C) (Oral)   Wt 209 lb (94.8 kg)   LMP 09/21/1974   BMI 37.02 kg/m   Constitutional: VS see above. General Appearance: alert, well-developed, well-nourished, NAD  Eyes: Normal lids and conjunctive, non-icteric sclera  Ears, Nose, Mouth, Throat: MMM, Normal external inspection ears/nares/mouth/lips/gums.  Neck: No masses, trachea midline.   Respiratory: Normal respiratory effort. no wheeze, no rhonchi, no rales  Cardiovascular: S1/S2 normal, no murmur, no rub/gallop auscultated. RRR.   Musculoskeletal: Gait normal. Symmetric and independent movement of all extremities  Neurological: Normal balance/coordination. No tremor.  Skin: warm, dry, intact.   Psychiatric: Normal judgment/insight. Normal mood and affect. Oriented x3.       Visit summary with medication list and pertinent instructions was printed for patient to review, patient was advised to alert [place] if any updates are needed. All questions at time of  visit were answered - patient instructed to contact office with any additional concerns. ER/RTC precautions were reviewed with the patient and understanding verbalized.      Please note: voice recognition software was used to produce this document, and typos may escape review. Please contact Dr. Sheppard Coil for any needed clarifications.    Follow up plan: Return in about 1 week (around 07/30/2018) for recheck vaginal discharge if needed, try again with A1C recheck .

## 2018-07-23 NOTE — Patient Instructions (Signed)
Plan:  For the sugars, let's see you back in a week or 2, please bring your home blood sugar readings with you.  We may need to repeat the A1c since it was either too elevated for our office machine here, or our machine may be in need of repair.  I sent refills of the following:   Famotidine  Isosorbide  Levothyroxine  metformin.  I sent new prescriptions for the following:  nicotine gum to help with cravings  Diflucan to help with vaginal discharge/possible yeast infection  Albuterol rescue inhaler  Mucinex which may or may not be covered by your insurance given that it is available over-the-counter

## 2018-07-24 ENCOUNTER — Other Ambulatory Visit: Payer: Self-pay

## 2018-07-24 MED ORDER — SERTRALINE HCL 100 MG PO TABS: 200.0000 mg | ORAL_TABLET | Freq: Every day | ORAL | 3 refills | Status: DC

## 2018-07-31 ENCOUNTER — Other Ambulatory Visit: Payer: Self-pay | Admitting: Osteopathic Medicine

## 2018-07-31 ENCOUNTER — Other Ambulatory Visit: Payer: Self-pay | Admitting: Sports Medicine

## 2018-07-31 ENCOUNTER — Ambulatory Visit (INDEPENDENT_AMBULATORY_CARE_PROVIDER_SITE_OTHER): Payer: Medicare HMO | Admitting: Osteopathic Medicine

## 2018-07-31 ENCOUNTER — Encounter: Payer: Self-pay | Admitting: Osteopathic Medicine

## 2018-07-31 ENCOUNTER — Ambulatory Visit (INDEPENDENT_AMBULATORY_CARE_PROVIDER_SITE_OTHER): Payer: Medicare HMO

## 2018-07-31 VITALS — BP 125/62 | HR 97 | Temp 98.1°F | Wt 209.5 lb

## 2018-07-31 DIAGNOSIS — M4316 Spondylolisthesis, lumbar region: Secondary | ICD-10-CM

## 2018-07-31 DIAGNOSIS — M25552 Pain in left hip: Secondary | ICD-10-CM

## 2018-07-31 DIAGNOSIS — M5117 Intervertebral disc disorders with radiculopathy, lumbosacral region: Secondary | ICD-10-CM

## 2018-07-31 DIAGNOSIS — I951 Orthostatic hypotension: Secondary | ICD-10-CM | POA: Diagnosis not present

## 2018-07-31 DIAGNOSIS — M1612 Unilateral primary osteoarthritis, left hip: Secondary | ICD-10-CM | POA: Diagnosis not present

## 2018-07-31 DIAGNOSIS — E118 Type 2 diabetes mellitus with unspecified complications: Secondary | ICD-10-CM

## 2018-07-31 DIAGNOSIS — N898 Other specified noninflammatory disorders of vagina: Secondary | ICD-10-CM

## 2018-07-31 DIAGNOSIS — G629 Polyneuropathy, unspecified: Secondary | ICD-10-CM

## 2018-07-31 DIAGNOSIS — M545 Low back pain: Secondary | ICD-10-CM

## 2018-07-31 DIAGNOSIS — M79605 Pain in left leg: Secondary | ICD-10-CM | POA: Diagnosis not present

## 2018-07-31 DIAGNOSIS — D649 Anemia, unspecified: Secondary | ICD-10-CM

## 2018-07-31 DIAGNOSIS — E039 Hypothyroidism, unspecified: Secondary | ICD-10-CM | POA: Diagnosis not present

## 2018-07-31 DIAGNOSIS — M11262 Other chondrocalcinosis, left knee: Secondary | ICD-10-CM

## 2018-07-31 DIAGNOSIS — R5382 Chronic fatigue, unspecified: Secondary | ICD-10-CM

## 2018-07-31 DIAGNOSIS — G894 Chronic pain syndrome: Secondary | ICD-10-CM | POA: Diagnosis not present

## 2018-07-31 DIAGNOSIS — M549 Dorsalgia, unspecified: Secondary | ICD-10-CM | POA: Diagnosis not present

## 2018-07-31 DIAGNOSIS — E119 Type 2 diabetes mellitus without complications: Secondary | ICD-10-CM | POA: Diagnosis not present

## 2018-07-31 DIAGNOSIS — Z794 Long term (current) use of insulin: Secondary | ICD-10-CM | POA: Diagnosis not present

## 2018-07-31 DIAGNOSIS — J449 Chronic obstructive pulmonary disease, unspecified: Secondary | ICD-10-CM | POA: Diagnosis not present

## 2018-07-31 LAB — POCT GLYCOSYLATED HEMOGLOBIN (HGB A1C): Hemoglobin A1C: 7.5 % — AB (ref 4.0–5.6)

## 2018-07-31 MED ORDER — INSULIN DEGLUDEC 200 UNIT/ML ~~LOC~~ SOPN: 28.0000 [IU] | PEN_INJECTOR | Freq: Every day | SUBCUTANEOUS | Status: DC

## 2018-07-31 MED ORDER — AMBULATORY NON FORMULARY MEDICATION: 99 refills | Status: DC

## 2018-07-31 MED ORDER — EPINEPHRINE 0.3 MG/0.3ML IJ SOAJ: 0.3000 mg | INTRAMUSCULAR | 99 refills | Status: DC | PRN

## 2018-07-31 MED ORDER — METRONIDAZOLE 500 MG PO TABS: 500.0000 mg | ORAL_TABLET | Freq: Two times a day (BID) | ORAL | 0 refills | Status: AC

## 2018-07-31 MED ORDER — FLUCONAZOLE 150 MG PO TABS: 150.0000 mg | ORAL_TABLET | Freq: Once | ORAL | 1 refills | Status: AC

## 2018-07-31 NOTE — Progress Notes (Signed)
HPI: Autumn Bowen is a 73 y.o. female who  has a past medical history of Allergy, Anemia, Celiac disease, Celiac disease, COPD (chronic obstructive pulmonary disease) ([place]), Diabetes mellitus type II, Diverticulitis, Dyslipidemia, HOH (hard of hearing), Hypertension, Hypothyroidism, Sleep apnea, and Sore throat.  she presents to [facility] today, 07/31/18,  for chief complaint of: Recheck sugars Vaginal discharge  Lightheaded Hip and back pain   Hip pain: more so on the left but both hurt. She requests Rx for R-handed cane. Reports balance loss more easily. Feels like she has to stand in place before she feels more steady.   Sweating: bothering her more than usual. Noticing night sweats. Feeling chills when she stands up, then will feel sweaty a few mins after that.   DM2: last visit A1C was too high to read, pt declined blood draw Insulin taking 28 units daily. A1C today is 7.3     Past medical, surgical, social and family history reviewed:  Patient Active Problem List   Diagnosis Date Noted  . Abnormal weight gain 02/21/2018  . Injury of left knee 02/21/2018  . Microalbuminuria due to type 2 diabetes mellitus ([place]) 02/21/2018  . Hyperlipidemia associated with type 2 diabetes mellitus ([place]) 02/21/2018  . CAD (coronary artery disease) 01/01/2018  . Chronic pain syndrome 12/13/2017  . Angina pectoris ([place]) 12/07/2017  . Diabetes mellitus due to underlying condition with unspecified complications ([place]) 50/56/9794  . Radiographic dye allergy status 12/05/2017  . Pseudogout of left knee 11/16/2017  . Hypotension 09/22/2016  . Osteopenia 05/24/2016  . Abdominal pain 04/12/2016  . Diverticulitis of colon 04/12/2016  . Broken or cracked tooth, nontraumatic 03/27/2016  . Memory deficit 05/05/2015  . Balance problems 05/05/2015  . Hearing difficulty of both ears 05/05/2015  . Mouth lesion 05/04/2015  . OSA (obstructive sleep apnea) 04/28/2015   . Seborrheic keratoses 01/31/2015  . Left-sided low back pain with left-sided sciatica 01/31/2015  . GERD (gastroesophageal reflux disease) 01/26/2015  . Celiac disease 01/26/2015  . Cervical spondylosis 12/04/2014  . Osteoarthritis of first metatarsophalangeal joint 11/17/2014  . Ingrown left big toenail 10/04/2014  . Seasonal allergies 10/04/2014  . Neuropathy 10/04/2014  . Toenail fungus 10/03/2014  . Adenomatous polyp of colon 07/30/2014  . Macular degeneration, dry 07/29/2014  . Type 2 diabetes mellitus with complication ([place]) 80/16/5537  . Migraine without aura and without status migrainosus, not intractable 07/28/2014  . EMPHYSEMA 05/11/2009  . COPD (chronic obstructive pulmonary disease) with chronic bronchitis ([place]) 05/10/2009  . VISION DISORDER 12/03/2008  . OSTEOPENIA 11/18/2008  . VITAMIN D DEFICIENCY 11/06/2008  . Major depressive disorder 11/05/2008  . FATIGUE 11/05/2008  . ANEMIA 10/23/2008  . DYSLIPIDEMIA 10/16/2008  . TOBACCO ABUSE 10/15/2008  . ALLERGIC RHINITIS 10/15/2008  . FRACTURE, ANKLE, RIGHT 04/29/2008  . DIARRHEA 07/01/2007  . POST TRAUMATIC STRESS SYNDROME 05/30/2000  . Hypothyroidism 05/30/1988  . POSTMENOPAUSAL STATUS 05/30/1968    Past Surgical History:  Procedure Laterality Date  . APPENDECTOMY    . BREAST SURGERY     h/o benign cystleft breast and lymp node removal on right breast area.  Marland Kitchen EYE SURGERY     Left eye cataract removal, right eye h/o macular degeneration.  . INTRAVASCULAR PRESSURE WIRE/FFR STUDY N/A 12/07/2017   Procedure: INTRAVASCULAR PRESSURE WIRE/FFR STUDY;  Surgeon: Leonie Man, MD;  Location: [facility];  Service: Cardiovascular;  Laterality: N/A;  . LEFT HEART CATH AND CORONARY ANGIOGRAPHY N/A 12/07/2017   Procedure: LEFT  HEART CATH AND CORONARY ANGIOGRAPHY;  Surgeon: Leonie Man, MD;  Location: [facility];  Service: Cardiovascular;  Laterality: N/A;  . SPINE SURGERY     Pt not sure of the type of  surgery she had but knows it was L4-5.  Marland Kitchen VAGINAL HYSTERECTOMY      Social History   Tobacco Use  . Smoking status: Current Some Day Smoker    Packs/day: 0.25    Years: 51.00    Pack years: 12.75    Types: Cigarettes    Start date: 01/02/2012  . Smokeless tobacco: Never Used  . Tobacco comment: Been down to 1 - 2 cigs a day  Substance Use Topics  . Alcohol use: No    Family History  Problem Relation Age of Onset  . Anxiety disorder Mother   . Depression Mother   . Heart attack Mother   . Stroke Mother   . Diabetes type II Mother   . Diabetes Father   . Glaucoma Father   . Hypertension Father   . COPD Father   . COPD Sister   . Hashimoto's thyroiditis Sister   . Cancer Sister   . Kidney disease Sister   . Drug abuse Brother   . Alcohol abuse Brother   . Asthma Son   . Diabetes Son      Current medication list and allergy/intolerance information reviewed:    Current Outpatient Medications  Medication Sig Dispense Refill  . ACCU-CHEK AVIVA PLUS test strip TEST 1-2 TIMES DAILY 100 each 1  . acetaminophen (TYLENOL) 500 MG tablet Take 1,000 mg by mouth every 6 (six) hours as needed for mild pain or moderate pain.    Marland Kitchen albuterol (PROVENTIL HFA;VENTOLIN HFA) 108 (90 Base) MCG/ACT inhaler Inhale 2 puffs into the lungs every 4 (four) hours as needed for wheezing. 2 Inhaler 11  . AMBULATORY NON FORMULARY MEDICATION Glucometer, test strips and lancets for testing once a day for DM, uncontrolled. 100 strip 0  . AMBULATORY NON FORMULARY MEDICATION Knee-high, medium compression, graduated compression stockings. Apply to lower extremities. Disp per patient preference and insurance coverage. 10 each prn  . amitriptyline (ELAVIL) 50 MG tablet TAKE 1 TABLET BY MOUTH EVERY NIGHT AT BEDTIME 90 tablet 3  . aspirin EC 81 MG tablet Take 1 tablet (81 mg total) by mouth daily. 90 tablet 3  . BD PEN NEEDLE NANO U/F 32G X 4 MM MISC USE AS DIRECTED 100 each 0  . cholecalciferol (VITAMIN D) 1000  units tablet Take 1,000 Units by mouth daily.    . colchicine 0.6 MG tablet Take 1-2 tablets (0.6-1.2 mg total) by mouth daily. 60 tablet 3  . diazepam (VALIUM) 5 MG tablet Take 1 tab PO 1 hour before procedure or imaging. 5 tablet 0  . dicyclomine (BENTYL) 10 MG capsule TAKE ONE CAPSULE BY MOUTH TWICE DAILY 180 capsule 0  . empagliflozin (JARDIANCE) 10 MG TABS tablet Take 10 mg by mouth daily. 90 tablet 1  . famotidine (PEPCID) 20 MG tablet TAKE 1 TABLET(20 MG TOTAL) BY MOUTH DAILY. 90 tablet 3  . Fluticasone-Salmeterol (ADVAIR) 250-50 MCG/DOSE AEPB Inhale 1 puff into the lungs every 12 (twelve) hours. 60 each 11  . GLUCOSAMINE-CHONDROITIN PO Take 1 tablet by mouth 2 (two) times daily.     . Insulin Degludec (TRESIBA FLEXTOUCH) 200 UNIT/ML SOPN Inject 28 Units into the skin at bedtime. Increase by 2 units every 5 days until fasting glucose 90-120.    Marland Kitchen isosorbide mononitrate (  IMDUR) 30 MG 24 hr tablet Take 1 tablet (30 mg total) by mouth daily. 90 tablet 3  . levocetirizine (XYZAL) 5 MG tablet Take 5 mg by mouth every evening.    Marland Kitchen levothyroxine (SYNTHROID, LEVOTHROID) 150 MCG tablet TAKE 1 TABLET BY MOUTH DAILY BEFORE BREAKFAST 90 tablet 3  . LUMIGAN 0.01 % SOLN Instill 1 drop into both eyes every night  3  . metFORMIN (GLUCOPHAGE-XR) 750 MG 24 hr tablet TAKE 1 TABLET(750 MG TOTAL) BY MOUTH DAILY WITH BREAKFAST. 90 tablet 1  . metFORMIN (GLUCOPHAGE-XR) 750 MG 24 hr tablet TAKE 1 TABLET(750 MG TOTAL) BY MOUTH DAILY WITH BREAKFAST. 90 tablet 3  . Multiple Vitamins-Minerals (PRESERVISION AREDS 2 PO) Take 1 capsule by mouth 2 (two) times daily.    . ondansetron (ZOFRAN) 8 MG tablet TAKE 1 TABLET BY MOUTH EVERY 8 HOURS AS NEEDED FOR NAUSEA AND VOMITING 20 tablet 2  . pregabalin (LYRICA) 50 MG capsule TAKE ONE CAPSULE BY MOUTH TWICE DAILY 180 capsule 0  . QUEtiapine (SEROQUEL) 200 MG tablet TAKE 1 TABLET(200 MG TOTAL) BY MOUTH AT BEDTIME. 90 tablet 0  . sertraline (ZOLOFT) 100 MG tablet Take 2 tablets  (200 mg total) by mouth daily. 120 tablet 3  . AMBULATORY NON FORMULARY MEDICATION Medication Name: right-handed cane, prongs if desired 1 Units prn  . diclofenac sodium (VOLTAREN) 1 % GEL Apply 4 g topically 4 (four) times daily. To affected joint. (Patient not taking: Reported on 07/31/2018) 100 g 11  . EPINEPHrine (EPIPEN 2-PAK) 0.3 mg/0.3 mL IJ SOAJ injection Inject 0.3 mLs (0.3 mg total) into the muscle as needed for anaphylaxis. 1 Device prn  . fluconazole (DIFLUCAN) 150 MG tablet Take 1 tablet (150 mg total) by mouth once for 1 dose. Repeat dose 72 hours if yeast infection persists 2 tablet 1  . metroNIDAZOLE (FLAGYL) 500 MG tablet Take 1 tablet (500 mg total) by mouth 2 (two) times daily for 7 days. 14 tablet 0  . nitroGLYCERIN (NITROSTAT) 0.4 MG SL tablet Place 1 tablet (0.4 mg total) under the tongue every 5 (five) minutes as needed for chest pain. 25 tablet 11  . rosuvastatin (CRESTOR) 40 MG tablet Take 1 tablet (40 mg total) by mouth daily. 90 tablet 3  . varenicline (CHANTIX) 1 MG tablet TAKE 1 TABLET BY MOUTH TWICE DAILY( EVERY TWELVE HOURS) (Patient not taking: Reported on 07/31/2018) 56 tablet 3   No current facility-administered medications for this visit.     Allergies  Allergen Reactions  . Bee Venom Anaphylaxis  . Iodinated Diagnostic Agents Other (See Comments)    Per patient cardiac arrest IVP DYE-CARDIAC ARREST  . Penicillins Anaphylaxis    Has patient had a PCN reaction causing immediate rash, facial/tongue/throat swelling, SOB or lightheadedness with hypotension: yes Has patient had a PCN reaction causing severe rash involving mucus membranes or skin necrosis: no Has patient had a PCN reaction that required hospitalization: yes Has patient had a PCN reaction occurring within the last 10 years: no If all of the above answers are "NO", then may proceed with Cephalosporin use.   . Povidone Iodine Itching and Rash  . Tomato Itching  . Chocolate Rash  . Latex Rash  .  Other Rash  . Atorvastatin Calcium Nausea Only  . Esomeprazole Magnesium Nausea Only  . Gluten Meal   . Metformin And Related Diarrhea  . Quetiapine Fumarate     Per pt she can not take XR  . Trazodone And Nefazodone Hives  .  Zolpidem Tartrate Nausea And Vomiting      Review of Systems:  Constitutional:  No  fever, +chills, No recent illness, No unintentional weight changes. +significant fatigue.   HEENT: No  headache, no vision change  Cardiac: No  chest pain, No  pressure, No palpitations  Respiratory:  +shortness of breath. No  Cough  Gastrointestinal: No  abdominal pain, +nausea, No  vomiting,  No  blood in stool, No  diarrhea, No  constipation   Musculoskeletal: No new myalgia/arthralgia  Skin: No  Rash, No other wounds/concerning lesions  Genitourinary: No  incontinence, No  abnormal genital bleeding, +abnormal genital discharge - clear, itching  Neurologic: No  weakness, +dizziness, No  slurred speech/focal weakness/facial droop   Psychiatric: No  concerns with depression, +concerns with anxiety, +sleep problems, No mood problems  Exam:  BP 125/62 (BP Location: Left Arm, Patient Position: Sitting, Cuff Size: Normal)   Pulse 97   Temp 98.1 F (36.7 C) (Oral)   Wt 209 lb 8 oz (95 kg)   LMP 09/21/1974   BMI 37.11 kg/m   Constitutional: VS see above. General Appearance: alert, well-developed, well-nourished, NAD, hygiene is a bit inappropriate but patient is not obviously soiled or foul smelling, appears disheveled.   Eyes: Normal lids and conjunctive, non-icteric sclera  Ears, Nose, Mouth, Throat: MMM, Normal external inspection ears/nares/mouth/lips/gums. Poor dentition.   Neck: No masses, trachea midline. No thyroid enlargement. No tenderness/mass appreciated. No lymphadenopathy  Respiratory: Normal respiratory effort. no wheeze, no rhonchi, no rales  Cardiovascular: S1/S2 normal, no murmur, no rub/gallop auscultated. RRR. No lower extremity edema.    Musculoskeletal: Gait normal.   Neurological: Normal balance/coordination. No cranial nerve deficit on limited exam. Motor and sensation intact and symmetric. Cerebellar reflexes intact. Chewing jaw movement ?tic  Skin: warm, dry, intact. No rash/ulcer.   Psychiatric: Fair judgment/insight. Normal mood and affect. Oriented x3. Disorganized but not manic   Results for orders placed or performed in visit on 07/31/18 (from the past 72 hour(s))  POCT HgB A1C     Status: Abnormal   Collection Time: 07/31/18 11:38 AM  Result Value Ref Range   Hemoglobin A1C 7.5 (A) 4.0 - 5.6 %   HbA1c POC (<> result, manual entry)     HbA1c, POC (prediabetic range)     HbA1c, POC (controlled diabetic range)    Advanced Written Notification [facility]) Test Refusal     Status: None   Collection Time: 07/31/18 12:05 PM  Result Value Ref Range   RAM1      Comment: . Be advised that your patient has indicated on the advance written notice their decision not to receive the following laboratory tests. As a result, the tests will not be performed.    AWN TEST REFUSED [id number]     No results found.       ASSESSMENT/PLAN: The primary encounter diagnosis was Type 2 diabetes mellitus with complication, with long-term current use of insulin ([place]). Diagnoses of Left hip pain, Pain of back and left lower extremity, Chronic pain syndrome, Orthostatic hypotension, Chronic fatigue, Hypothyroidism, unspecified type, Anemia, unspecified type, and Vaginal discharge were also pertinent to this visit.   DM2 stable  I think unsteadiness possibly d/t MSK issue w/ LBP/Hip pain, f/u sports medicine, polypharmacy certainly a concern. She thinks it's w/drawal from the Chantix but last dose was December so this is doubtful. She is somewhat more disheveled and disorganized but not outright delirious, will see what labs show   Orders Placed This Encounter  Procedures  . Urine Culture  . DG Lumbar Spine Complete  . DG HIP  UNILAT W OR W/O PELVIS 2-3 VIEWS LEFT  . CBC with Differential/Platelet  . COMPLETE METABOLIC PANEL WITH GFR  . TSH  . T4, free  . VITAMIN D 25 Hydroxy (Vit-D Deficiency, Fractures)  . Magnesium  . Urinalysis, Routine w reflex microscopic  . Advanced Written Notification [facility]) Test Refusal  . POCT HgB A1C    Meds ordered this encounter  Medications  . AMBULATORY NON FORMULARY MEDICATION    Sig: Medication Name: right-handed cane, prongs if desired    Dispense:  1 Units    Refill:  prn  . EPINEPHrine (EPIPEN 2-PAK) 0.3 mg/0.3 mL IJ SOAJ injection    Sig: Inject 0.3 mLs (0.3 mg total) into the muscle as needed for anaphylaxis.    Dispense:  1 Device    Refill:  prn  . Insulin Degludec (TRESIBA FLEXTOUCH) 200 UNIT/ML SOPN    Sig: Inject 28 Units into the skin at bedtime. Increase by 2 units every 5 days until fasting glucose 90-120.  . fluconazole (DIFLUCAN) 150 MG tablet    Sig: Take 1 tablet (150 mg total) by mouth once for 1 dose. Repeat dose 72 hours if yeast infection persists    Dispense:  2 tablet    Refill:  1  . metroNIDAZOLE (FLAGYL) 500 MG tablet    Sig: Take 1 tablet (500 mg total) by mouth 2 (two) times daily for 7 days.    Dispense:  14 tablet    Refill:  0          Visit summary with medication list and pertinent instructions was printed for patient to review. All questions at time of visit were answered - patient instructed to contact office with any additional concerns or updates. ER/RTC precautions were reviewed with the patient.     Please note: voice recognition software was used to produce this document, and typos may escape review. Please contact Dr. Sheppard Coil for any needed clarifications.     Follow-up plan: Return for recheck leg/hip with Dr. Darene Lamer ASAP. Recheck other chronic issues with Dr A in 2 weeks. Marland Kitchen

## 2018-08-01 ENCOUNTER — Ambulatory Visit (INDEPENDENT_AMBULATORY_CARE_PROVIDER_SITE_OTHER): Payer: Medicare HMO | Admitting: Family Medicine

## 2018-08-01 ENCOUNTER — Encounter: Payer: Self-pay | Admitting: Family Medicine

## 2018-08-01 VITALS — BP 129/64 | HR 103 | Wt 208.0 lb

## 2018-08-01 DIAGNOSIS — M7062 Trochanteric bursitis, left hip: Secondary | ICD-10-CM

## 2018-08-01 NOTE — Patient Instructions (Signed)
Thank you for coming in today. Call or go to the ER if you develop a large red swollen joint with extreme pain or oozing puss.   Work on side leg raises. Try to do 10- 30  2-3x daily.  Try the cross over stretch.   Consider physical therapy.  Let me know if you want a referral.    Trochanteric Bursitis Rehab Ask your health care provider which exercises are safe for you. Do exercises exactly as told by your health care provider and adjust them as directed. It is normal to feel mild stretching, pulling, tightness, or discomfort as you do these exercises, but you should stop right away if you feel sudden pain or your pain gets worse.Do not begin these exercises until told by your health care provider. Stretching exercises These exercises warm up your muscles and joints and improve the movement and flexibility of your hip. These exercises also help to relieve pain and stiffness. Exercise A: Iliotibial band stretch  1. Lie on your side with your left / right leg in the top position. 2. Bend your left / right knee and grab your ankle. 3. Slowly bring your knee back so your thigh is behind your body. 4. Slowly lower your knee toward the floor until you feel a gentle stretch on the outside of your left / right thigh. If you do not feel a stretch and your knee will not fall farther, place the heel of your other foot on top of your outer knee and pull your thigh down farther. 5. Hold this position for __________ seconds. 6. Slowly return to the starting position. Repeat __________ times. Complete this exercise __________ times a day. Strengthening exercises These exercises build strength and endurance in your hip and pelvis. Endurance is the ability to use your muscles for a long time, even after they get tired. Exercise B: Bridge (hip extensors)  1. Lie on your back on a firm surface with your knees bent and your feet flat on the floor. 2. Tighten your buttocks muscles and lift your buttocks off  the floor until your trunk is level with your thighs. You should feel the muscles working in your buttocks and the back of your thighs. If this exercise is too easy, try doing it with your arms crossed over your chest. 3. Hold this position for __________ seconds. 4. Slowly return to the starting position. 5. Let your muscles relax completely between repetitions. Repeat __________ times. Complete this exercise __________ times a day. Exercise C: Squats (knee extensors and  quadriceps) 1. Stand in front of a table, with your feet and knees pointing straight ahead. You may rest your hands on the table for balance but not for support. 2. Slowly bend your knees and lower your hips like you are going to sit in a chair. ? Keep your weight over your heels, not over your toes. ? Keep your lower legs upright so they are parallel with the table legs. ? Do not let your hips go lower than your knees. ? Do not bend lower than told by your health care provider. ? If your hip pain increases, do not bend as low. 3. Hold this position for __________ seconds. 4. Slowly push with your legs to return to standing. Do not use your hands to pull yourself to standing. Repeat __________ times. Complete this exercise __________ times a day. Exercise D: Hip hike 1. Stand sideways on a bottom step. Stand on your left / right leg with your  other foot unsupported next to the step. You can hold onto the railing or wall if needed for balance. 2. Keeping your knees straight and your torso square, lift your left / right hip up toward the ceiling. 3. Hold this position for __________ seconds. 4. Slowly let your left / right hip lower toward the floor, past the starting position. Your foot should get closer to the floor. Do not lean or bend your knees. Repeat __________ times. Complete this exercise __________ times a day. Exercise E: Single leg stand 1. Stand near a counter or door frame that you can hold onto for balance as  needed. It is helpful to stand in front of a mirror for this exercise so you can watch your hip. 2. Squeeze your left / right buttock muscles then lift up your other foot. Do not let your left / right hip push out to the side. 3. Hold this position for __________ seconds. Repeat __________ times. Complete this exercise __________ times a day. This information is not intended to replace advice given to you by your health care provider. Make sure you discuss any questions you have with your health care provider. Document Released: 06/23/2004 Document Revised: 01/21/2016 Document Reviewed: 05/01/2015 Elsevier Interactive Patient Education  2019 [employer].

## 2018-08-01 NOTE — Progress Notes (Signed)
Autumn Bowen is a 73 y.o. female who presents to [facility] today for left lateral hip pain.  Symptoms started 1-1/2 weeks ago without injury.  Pain is worse when she stands from a seated position or lays on her left side.  Pain is worse with ambulation.  She notes the pain refers to the lateral thigh but not below the level of the knee.  She notes that the pain is quite bad.  It interferes with her ability to walk normally and complete normal activities of daily living.  Tries over-the-counter medications for pain control which helped only a little.   ROS as above:  Exam:  BP 129/64   Pulse (!) 103   Wt 208 lb (94.3 kg)   LMP 09/21/1974   BMI 36.85 kg/m  Wt Readings from Last 5 Encounters:  08/01/18 208 lb (94.3 kg)  07/31/18 209 lb 8 oz (95 kg)  07/23/18 209 lb (94.8 kg)  06/25/18 205 lb (93 kg)  06/13/18 206 lb 1.9 oz (93.5 kg)    Gen: Well NAD HEENT: EOMI,  MMM Lungs: Normal work of breathing. CTABL Heart: RRR no MRG Abd: NABS, Soft. Nondistended, Nontender Exts: Brisk capillary refill, warm and well perfused.  L-spine nontender to midline.  Normal lumbar motion. Left hip: Normal-appearing normal motion. Tender palpation greater trochanter. Hip abduction strength is 4/5 with significant pain. Contralateral right hip nontender normal motion   Hip greater trochanteric injection: elft Consent obtained and timeout performed. Area of maximum tenderness palpated and identified. Skin cleaned with alcohol, cold spray applied. A spinal needle was used to access the greater trochanteric bursa. 80 mg of Depo-Medrol, and 4 mL of Marcaine were used to inject the trochanteric bursa. Patient tolerated the procedure well.     Lab and Radiology Results Dg Lumbar Spine Complete  Result Date: 07/31/2018 CLINICAL DATA:  Left hip pain and low back pain 1-2  weeks. Previous lumbar surgery. EXAM: LUMBAR SPINE - COMPLETE 4+ VIEW COMPARISON:  11/16/2017 FINDINGS: Examination demonstrates normal vertebral body heights. There is mild to moderate spondylosis throughout the lumbar spine to include moderate facet arthropathy over the mid to lower lumbar spine. There is a subtle grade 1 anterolisthesis of L4 on L5 unchanged likely due to the moderate facet arthropathy. Mild disc space narrowing at the L5-S1 level greater than the L4-5 level. Remainder of the exam is unchanged. IMPRESSION: Mild-to-moderate spondylosis of the lumbar spine with disc disease at the L5-S1 level greater than the L4-5 level. Stable mild grade 1 anterolisthesis of L4 on L5. Electronically Signed   By: Marin Olp M.D.   On: 07/31/2018 17:59   Dg Hip Unilat W Or W/o Pelvis 2-3 Views Left  Result Date: 07/31/2018 CLINICAL DATA:  Left hip pain and low back pain 1-2 weeks. EXAM: DG HIP (WITH OR WITHOUT PELVIS) 2-3V LEFT COMPARISON:  Lumbar spine 11/16/2017 and CT 04/12/2016 FINDINGS: Bones, joint spaces and soft tissues over the hips are unremarkable without fracture or dislocation. No significant degenerative changes. Mild degenerate change of the spine and sacroiliac joints. IMPRESSION: No acute findings. Electronically Signed   By: Marin Olp M.D.   On: 07/31/2018 18:00   I personally (independently) visualized and performed the interpretation of the images attached in this note.    Assessment and Plan: 73 y.o. female with lateral hip pain due to trochanteric bursitis.  Plan for injection as above and home exercise program.  Patient  declined formal physical therapy due to cost however that is the next step if not better.  Recheck in 1 month or sooner if needed.  PDMP not reviewed this encounter. No orders of the defined types were placed in this encounter.  No orders of the defined types were placed in this encounter.    Historical information moved to improve visibility of  documentation.  Past Medical History:  Diagnosis Date  . Allergy    Notes that grass and some trees causes eyes to sting, burn, and water.  . Anemia   . Celiac disease   . Celiac disease   . COPD (chronic obstructive pulmonary disease) ([place])   . Diabetes mellitus type II   . Diverticulitis   . Dyslipidemia   . HOH (hard of hearing)   . Hypertension   . Hypothyroidism   . Sleep apnea   . Sore throat    Past Surgical History:  Procedure Laterality Date  . APPENDECTOMY    . BREAST SURGERY     h/o benign cystleft breast and lymp node removal on right breast area.  Marland Kitchen EYE SURGERY     Left eye cataract removal, right eye h/o macular degeneration.  . INTRAVASCULAR PRESSURE WIRE/FFR STUDY N/A 12/07/2017   Procedure: INTRAVASCULAR PRESSURE WIRE/FFR STUDY;  Surgeon: Leonie Man, MD;  Location: [facility];  Service: Cardiovascular;  Laterality: N/A;  . LEFT HEART CATH AND CORONARY ANGIOGRAPHY N/A 12/07/2017   Procedure: LEFT HEART CATH AND CORONARY ANGIOGRAPHY;  Surgeon: Leonie Man, MD;  Location: [facility];  Service: Cardiovascular;  Laterality: N/A;  . SPINE SURGERY     Pt not sure of the type of surgery she had but knows it was L4-5.  Marland Kitchen VAGINAL HYSTERECTOMY     Social History   Tobacco Use  . Smoking status: Current Some Day Smoker    Packs/day: 0.25    Years: 51.00    Pack years: 12.75    Types: Cigarettes    Start date: 01/02/2012  . Smokeless tobacco: Never Used  . Tobacco comment: Been down to 1 - 2 cigs a day  Substance Use Topics  . Alcohol use: No   family history includes Alcohol abuse in her brother; Anxiety disorder in her mother; Asthma in her son; COPD in her father and sister; Cancer in her sister; Depression in her mother; Diabetes in her father and son; Diabetes type II in her mother; Drug abuse in her brother; Glaucoma in her father; Hashimoto's thyroiditis in her sister; Heart attack in her mother; Hypertension in her father; Kidney  disease in her sister; Stroke in her mother.  Medications: Current Outpatient Medications  Medication Sig Dispense Refill  . ACCU-CHEK AVIVA PLUS test strip TEST 1-2 TIMES DAILY 100 each 1  . acetaminophen (TYLENOL) 500 MG tablet Take 1,000 mg by mouth every 6 (six) hours as needed for mild pain or moderate pain.    Marland Kitchen albuterol (PROVENTIL HFA;VENTOLIN HFA) 108 (90 Base) MCG/ACT inhaler Inhale 2 puffs into the lungs every 4 (four) hours as needed for wheezing. 2 Inhaler 11  . AMBULATORY NON FORMULARY MEDICATION Glucometer, test strips and lancets for testing once a day for DM, uncontrolled. 100 strip 0  . AMBULATORY NON FORMULARY MEDICATION Knee-high, medium compression, graduated compression stockings. Apply to lower extremities. Disp per patient preference and insurance coverage. 10 each prn  . AMBULATORY NON FORMULARY MEDICATION Medication Name: right-handed cane, prongs if desired 1 Units prn  . amitriptyline (ELAVIL)  50 MG tablet TAKE 1 TABLET BY MOUTH EVERY NIGHT AT BEDTIME 90 tablet 3  . aspirin EC 81 MG tablet Take 1 tablet (81 mg total) by mouth daily. 90 tablet 3  . BD PEN NEEDLE NANO U/F 32G X 4 MM MISC USE AS DIRECTED 100 each 0  . cholecalciferol (VITAMIN D) 1000 units tablet Take 1,000 Units by mouth daily.    . colchicine 0.6 MG tablet TAKE 1 TO 2 TABLETS(0.6 TO 1.2 MG) BY MOUTH DAILY 60 tablet 3  . diazepam (VALIUM) 5 MG tablet Take 1 tab PO 1 hour before procedure or imaging. 5 tablet 0  . diclofenac sodium (VOLTAREN) 1 % GEL Apply 4 g topically 4 (four) times daily. To affected joint. 100 g 11  . dicyclomine (BENTYL) 10 MG capsule TAKE ONE CAPSULE BY MOUTH TWICE DAILY 180 capsule 0  . empagliflozin (JARDIANCE) 10 MG TABS tablet Take 10 mg by mouth daily. 90 tablet 1  . EPINEPHrine (EPIPEN 2-PAK) 0.3 mg/0.3 mL IJ SOAJ injection Inject 0.3 mLs (0.3 mg total) into the muscle as needed for anaphylaxis. 1 Device prn  . famotidine (PEPCID) 20 MG tablet TAKE 1 TABLET(20 MG TOTAL) BY  MOUTH DAILY. 90 tablet 3  . Fluticasone-Salmeterol (ADVAIR) 250-50 MCG/DOSE AEPB Inhale 1 puff into the lungs every 12 (twelve) hours. 60 each 11  . GLUCOSAMINE-CHONDROITIN PO Take 1 tablet by mouth 2 (two) times daily.     . Insulin Degludec (TRESIBA FLEXTOUCH) 200 UNIT/ML SOPN Inject 28 Units into the skin at bedtime. Increase by 2 units every 5 days until fasting glucose 90-120.    Marland Kitchen isosorbide mononitrate (IMDUR) 30 MG 24 hr tablet Take 1 tablet (30 mg total) by mouth daily. 90 tablet 3  . levocetirizine (XYZAL) 5 MG tablet Take 5 mg by mouth every evening.    Marland Kitchen levothyroxine (SYNTHROID, LEVOTHROID) 150 MCG tablet TAKE 1 TABLET BY MOUTH DAILY BEFORE BREAKFAST 90 tablet 3  . LUMIGAN 0.01 % SOLN Instill 1 drop into both eyes every night  3  . metFORMIN (GLUCOPHAGE-XR) 750 MG 24 hr tablet TAKE 1 TABLET(750 MG TOTAL) BY MOUTH DAILY WITH BREAKFAST. 90 tablet 1  . metFORMIN (GLUCOPHAGE-XR) 750 MG 24 hr tablet TAKE 1 TABLET(750 MG TOTAL) BY MOUTH DAILY WITH BREAKFAST. 90 tablet 3  . metroNIDAZOLE (FLAGYL) 500 MG tablet Take 1 tablet (500 mg total) by mouth 2 (two) times daily for 7 days. 14 tablet 0  . Multiple Vitamins-Minerals (PRESERVISION AREDS 2 PO) Take 1 capsule by mouth 2 (two) times daily.    . ondansetron (ZOFRAN) 8 MG tablet TAKE 1 TABLET BY MOUTH EVERY 8 HOURS AS NEEDED FOR NAUSEA AND VOMITING 20 tablet 2  . pregabalin (LYRICA) 50 MG capsule TAKE 1 CAPSULE BY MOUTH TWICE DAILY 180 capsule 1  . QUEtiapine (SEROQUEL) 200 MG tablet TAKE 1 TABLET(200 MG TOTAL) BY MOUTH AT BEDTIME. 90 tablet 0  . sertraline (ZOLOFT) 100 MG tablet Take 2 tablets (200 mg total) by mouth daily. 120 tablet 3  . varenicline (CHANTIX) 1 MG tablet TAKE 1 TABLET BY MOUTH TWICE DAILY( EVERY TWELVE HOURS) 56 tablet 3  . nitroGLYCERIN (NITROSTAT) 0.4 MG SL tablet Place 1 tablet (0.4 mg total) under the tongue every 5 (five) minutes as needed for chest pain. 25 tablet 11  . rosuvastatin (CRESTOR) 40 MG tablet Take 1 tablet  (40 mg total) by mouth daily. 90 tablet 3   No current facility-administered medications for this visit.    Allergies  Allergen  Reactions  . Bee Venom Anaphylaxis  . Iodinated Diagnostic Agents Other (See Comments)    Per patient cardiac arrest IVP DYE-CARDIAC ARREST  . Penicillins Anaphylaxis    Has patient had a PCN reaction causing immediate rash, facial/tongue/throat swelling, SOB or lightheadedness with hypotension: yes Has patient had a PCN reaction causing severe rash involving mucus membranes or skin necrosis: no Has patient had a PCN reaction that required hospitalization: yes Has patient had a PCN reaction occurring within the last 10 years: no If all of the above answers are "NO", then may proceed with Cephalosporin use.   . Povidone Iodine Itching and Rash  . Tomato Itching  . Chocolate Rash  . Latex Rash  . Other Rash  . Atorvastatin Calcium Nausea Only  . Esomeprazole Magnesium Nausea Only  . Gluten Meal   . Metformin And Related Diarrhea  . Quetiapine Fumarate     Per pt she can not take XR  . Trazodone And Nefazodone Hives  . Zolpidem Tartrate Nausea And Vomiting     Discussed warning signs or symptoms. Please see discharge instructions. Patient expresses understanding.

## 2018-08-02 LAB — COMPLETE METABOLIC PANEL WITH GFR
AG Ratio: 1.6 (calc) (ref 1.0–2.5)
ALT: 22 U/L (ref 6–29)
AST: 25 U/L (ref 10–35)
Albumin: 4.2 g/dL (ref 3.6–5.1)
Alkaline phosphatase (APISO): 62 U/L (ref 37–153)
BUN: 16 mg/dL (ref 7–25)
CO2: 30 mmol/L (ref 20–32)
Calcium: 9.4 mg/dL (ref 8.6–10.4)
Chloride: 100 mmol/L (ref 98–110)
Creat: 0.7 mg/dL (ref 0.60–0.93)
GFR, Est African American: 100 mL/min/{1.73_m2} (ref >=60)
GFR, Est Non African American: 86 mL/min/{1.73_m2} (ref >=60)
Globulin: 2.7 g/dL (calc) (ref 1.9–3.7)
Glucose, Bld: 263 mg/dL — ABNORMAL HIGH (ref 65–99)
Potassium: 4.4 mmol/L (ref 3.5–5.3)
Sodium: 138 mmol/L (ref 135–146)
Total Bilirubin: 0.2 mg/dL (ref 0.2–1.2)
Total Protein: 6.9 g/dL (ref 6.1–8.1)

## 2018-08-02 LAB — CBC WITH DIFFERENTIAL/PLATELET
Absolute Monocytes: 1250 cells/uL — ABNORMAL HIGH (ref 200–950)
Basophils Absolute: 66 cells/uL (ref 0–200)
Basophils Relative: 0.7 %
Eosinophils Absolute: 348 cells/uL (ref 15–500)
Eosinophils Relative: 3.7 %
HCT: 42.5 % (ref 35.0–45.0)
Hemoglobin: 13.7 g/dL (ref 11.7–15.5)
Lymphs Abs: 3506 cells/uL (ref 850–3900)
MCH: 28.7 pg (ref 27.0–33.0)
MCHC: 32.2 g/dL (ref 32.0–36.0)
MCV: 88.9 fL (ref 80.0–100.0)
MPV: 11.2 fL (ref 7.5–12.5)
Monocytes Relative: 13.3 %
Neutro Abs: 4230 cells/uL (ref 1500–7800)
Neutrophils Relative %: 45 %
Platelets: 305 10*3/uL (ref 140–400)
RBC: 4.78 10*6/uL (ref 3.80–5.10)
RDW: 13.9 % (ref 11.0–15.0)
Total Lymphocyte: 37.3 %
WBC: 9.4 10*3/uL (ref 3.8–10.8)

## 2018-08-02 LAB — URINALYSIS, ROUTINE W REFLEX MICROSCOPIC
Bacteria, UA: NONE SEEN /HPF
Bilirubin Urine: NEGATIVE
Hgb urine dipstick: NEGATIVE
Hyaline Cast: NONE SEEN /LPF
Ketones, ur: NEGATIVE
Nitrite: NEGATIVE
Protein, ur: NEGATIVE
RBC / HPF: NONE SEEN /HPF (ref 0–2)
Specific Gravity, Urine: 1.04 — ABNORMAL HIGH (ref 1.001–1.03)
pH: <=5 (ref 5.0–8.0)

## 2018-08-02 LAB — URINE CULTURE
MICRO NUMBER:: 271208
SPECIMEN QUALITY:: ADEQUATE

## 2018-08-02 LAB — ADVANCED WRITTEN NOTIFICATION (AWN) TEST REFUSAL: AWN TEST REFUSED: 17306

## 2018-08-02 LAB — MAGNESIUM: Magnesium: 2.1 mg/dL (ref 1.5–2.5)

## 2018-08-02 LAB — TSH: TSH: 2.75 mIU/L (ref 0.40–4.50)

## 2018-08-02 LAB — T4, FREE: Free T4: 0.9 ng/dL (ref 0.8–1.8)

## 2018-08-03 MED ORDER — AZITHROMYCIN 250 MG PO TABS: ORAL_TABLET | ORAL | 0 refills | Status: DC

## 2018-08-03 NOTE — Addendum Note (Signed)
Addended by: Maryla Morrow on: 08/03/2018 01:22 PM   Modules accepted: Orders

## 2018-08-09 ENCOUNTER — Other Ambulatory Visit: Payer: Self-pay | Admitting: Osteopathic Medicine

## 2018-08-15 ENCOUNTER — Ambulatory Visit (INDEPENDENT_AMBULATORY_CARE_PROVIDER_SITE_OTHER): Payer: Medicare HMO | Admitting: Osteopathic Medicine

## 2018-08-15 ENCOUNTER — Other Ambulatory Visit: Payer: Self-pay

## 2018-08-15 ENCOUNTER — Ambulatory Visit: Payer: Self-pay | Admitting: Osteopathic Medicine

## 2018-08-15 ENCOUNTER — Encounter: Payer: Self-pay | Admitting: Osteopathic Medicine

## 2018-08-15 VITALS — BP 119/58 | HR 98 | Temp 98.3°F | Wt 206.0 lb

## 2018-08-15 DIAGNOSIS — M549 Dorsalgia, unspecified: Secondary | ICD-10-CM

## 2018-08-15 DIAGNOSIS — M79605 Pain in left leg: Secondary | ICD-10-CM

## 2018-08-15 DIAGNOSIS — M7062 Trochanteric bursitis, left hip: Secondary | ICD-10-CM

## 2018-08-15 DIAGNOSIS — G629 Polyneuropathy, unspecified: Secondary | ICD-10-CM

## 2018-08-15 DIAGNOSIS — R3989 Other symptoms and signs involving the genitourinary system: Secondary | ICD-10-CM

## 2018-08-15 DIAGNOSIS — M11262 Other chondrocalcinosis, left knee: Secondary | ICD-10-CM | POA: Diagnosis not present

## 2018-08-15 LAB — POCT URINALYSIS DIPSTICK
Bilirubin, UA: NEGATIVE
Blood, UA: NEGATIVE
Glucose, UA: POSITIVE — AB
Ketones, UA: NEGATIVE
Nitrite, UA: NEGATIVE
Protein, UA: NEGATIVE
Spec Grav, UA: 1.015 (ref 1.010–1.025)
Urobilinogen, UA: 0.2 E.U./dL
pH, UA: 5.5 (ref 5.0–8.0)

## 2018-08-15 MED ORDER — PREGABALIN 75 MG PO CAPS: ORAL_CAPSULE | ORAL | 0 refills | Status: DC

## 2018-08-15 MED ORDER — COLCHICINE 0.6 MG PO TABS: 0.6000 mg | ORAL_TABLET | Freq: Every day | ORAL | 3 refills | Status: DC

## 2018-08-15 NOTE — Patient Instructions (Addendum)
Plan:  For pain: will increase Lyrica from 50 mg to 75 mg twice per day , can increase up to three times per day if this isn't working.   Will try to get colchicine covered but I think we may be able to just stop this medicine since the gouty flare was awhile ago.   Will check urine - may take a couple days to result.

## 2018-08-15 NOTE — Progress Notes (Signed)
HPI: Autumn Bowen is a 73 y.o. female who  has a past medical history of Allergy, Anemia, Celiac disease, Celiac disease, COPD (chronic obstructive pulmonary disease) ([place]), Diabetes mellitus type II, Diverticulitis, Dyslipidemia, HOH (hard of hearing), Hypertension, Hypothyroidism, Sleep apnea, and Sore throat.  she presents to [facility] today, 08/15/18,  for chief complaint of:  Recheck from last visit   Last visit was a bit moore mentally disorganized but not outright delirious, she was concerned about balance issues as well as MSK problem - has since seen Dr Georgina Snell for hip pain Dx trochanteric bursitis.    Today, she appears acting more normally, not quite as scattered, hygiene is a lot better.  She still has some concerns about a possible UTI, she states occasionally her urine has a greenish color to it and some sediment in it.  She still is reporting significant generalized pain issues that is causing her to stay in bed more.  It is bad in the morning, gets worse with movement throughout the day, no morning stiffness.  Concerned she can't get Colchicine - not covered.  Was given this in the past for pseudogout.  Has been taking this once daily.  Original prescription by sports medicine, Dr. Darene Lamer.     At today's visit 08/15/18 ... PMH, PSH, FH reviewed and updated as needed.  Current medication list and allergy/intolerance hx reviewed and updated as needed. (See remainder of HPI, ROS, Phys Exam below)   No results found.  Results for orders placed or performed in visit on 08/15/18 (from the past 72 hour(s))  Urinalysis, microscopic only     Status: Abnormal   Collection Time: 08/15/18  4:23 PM  Result Value Ref Range   WBC, UA 0-5 0 - 5 /HPF   RBC / HPF 0-2 0 - 2 /HPF   Squamous Epithelial / LPF 0-5 < OR = 5 /HPF   Bacteria, UA FEW (A) NONE SEEN /HPF   Hyaline Cast NONE SEEN NONE SEEN /LPF   Note      Comment: This urine was analyzed for the  presence of WBC,  RBC, bacteria, casts, and other formed elements.  Only those elements seen were reported. . .   POCT Urinalysis Dipstick     Status: Abnormal   Collection Time: 08/15/18  4:30 PM  Result Value Ref Range   Color, UA Yellow    Clarity, UA clear    Glucose, UA Positive (A) Negative    Comment: >=1000 mg /dl   Bilirubin, UA Negative    Ketones, UA Negative    Spec Grav, UA 1.015 1.010 - 1.025   Blood, UA Negative    pH, UA 5.5 5.0 - 8.0   Protein, UA Negative Negative   Urobilinogen, UA 0.2 0.2 or 1.0 E.U./dL   Nitrite, UA Negative    Leukocytes, UA Small (1+) (A) Negative   Appearance     Odor            ASSESSMENT/PLAN: The primary encounter diagnosis was Abnormal urine color. Diagnoses of Pseudogout of left knee, Trochanteric bursitis of left hip, Pain of back and left lower extremity, and Neuropathy were also pertinent to this visit.   Orders Placed This Encounter  Procedures  . Urine Culture  . Urinalysis, microscopic only  . POCT Urinalysis Dipstick     Meds ordered this encounter  Medications  . colchicine 0.6 MG tablet    Sig: Take 1 tablet (0.6 mg total) by  mouth daily.    Dispense:  90 tablet    Refill:  3    diagnosis of PSEUDOGOUT  . pregabalin (LYRICA) 75 MG capsule    Sig: Take 1 capsule (75 mg) po as needed for pain - start with bid prn for one week then can increase to tid prn if needed .    Dispense:  90 capsule    Refill:  0    Please cancel 50 mg dose thanks!    Patient Instructions  Plan:  For pain: will increase Lyrica from 50 mg to 75 mg twice per day , can increase up to three times per day if this isn't working.   Will try to get colchicine covered but I think we may be able to just stop this medicine since the gouty flare was awhile ago.   Will check urine - may take a couple days to result.         Follow-up plan: Return in about 3 months (around 11/15/2018), or sooner if symptoms worsen or fail to improve,  for recheck diabetes .                                                 ################################################# ################################################# ################################################# #################################################    Current Meds  Medication Sig  . ACCU-CHEK AVIVA PLUS test strip TEST 1-2 TIMES DAILY  . acetaminophen (TYLENOL) 500 MG tablet Take 1,000 mg by mouth every 6 (six) hours as needed for mild pain or moderate pain.  Marland Kitchen albuterol (PROVENTIL HFA;VENTOLIN HFA) 108 (90 Base) MCG/ACT inhaler Inhale 2 puffs into the lungs every 4 (four) hours as needed for wheezing.  . AMBULATORY NON FORMULARY MEDICATION Glucometer, test strips and lancets for testing once a day for DM, uncontrolled.  . AMBULATORY NON FORMULARY MEDICATION Knee-high, medium compression, graduated compression stockings. Apply to lower extremities. Disp per patient preference and insurance coverage.  . AMBULATORY NON FORMULARY MEDICATION Medication Name: right-handed cane, prongs if desired  . amitriptyline (ELAVIL) 50 MG tablet TAKE 1 TABLET BY MOUTH EVERY NIGHT AT BEDTIME  . aspirin EC 81 MG tablet Take 1 tablet (81 mg total) by mouth daily.  . BD PEN NEEDLE NANO U/F 32G X 4 MM MISC USE AS DIRECTED  . cholecalciferol (VITAMIN D) 1000 units tablet Take 1,000 Units by mouth daily.  . colchicine 0.6 MG tablet Take 1 tablet (0.6 mg total) by mouth daily.  . diazepam (VALIUM) 5 MG tablet Take 1 tab PO 1 hour before procedure or imaging.  . diclofenac sodium (VOLTAREN) 1 % GEL Apply 4 g topically 4 (four) times daily. To affected joint.  Marland Kitchen dicyclomine (BENTYL) 10 MG capsule TAKE ONE CAPSULE BY MOUTH TWICE DAILY  . empagliflozin (JARDIANCE) 10 MG TABS tablet Take 10 mg by mouth daily.  Marland Kitchen EPINEPHrine (EPIPEN 2-PAK) 0.3 mg/0.3 mL IJ SOAJ injection Inject 0.3 mLs (0.3 mg total) into the muscle as needed for anaphylaxis.  .  famotidine (PEPCID) 20 MG tablet TAKE 1 TABLET(20 MG TOTAL) BY MOUTH DAILY.  Marland Kitchen Fluticasone-Salmeterol (ADVAIR) 250-50 MCG/DOSE AEPB Inhale 1 puff into the lungs every 12 (twelve) hours.  Marland Kitchen GLUCOSAMINE-CHONDROITIN PO Take 1 tablet by mouth 2 (two) times daily.   . Insulin Degludec (TRESIBA FLEXTOUCH) 200 UNIT/ML SOPN Inject 28 Units into the skin at bedtime. Increase by 2 units every 5 days until fasting glucose  90-120.  Marland Kitchen isosorbide mononitrate (IMDUR) 30 MG 24 hr tablet Take 1 tablet (30 mg total) by mouth daily.  Marland Kitchen levocetirizine (XYZAL) 5 MG tablet Take 5 mg by mouth every evening.  Marland Kitchen levothyroxine (SYNTHROID, LEVOTHROID) 150 MCG tablet TAKE 1 TABLET BY MOUTH DAILY BEFORE BREAKFAST  . LUMIGAN 0.01 % SOLN Instill 1 drop into both eyes every night  . metFORMIN (GLUCOPHAGE-XR) 750 MG 24 hr tablet TAKE 1 TABLET(750 MG TOTAL) BY MOUTH DAILY WITH BREAKFAST.  . metFORMIN (GLUCOPHAGE-XR) 750 MG 24 hr tablet TAKE 1 TABLET(750 MG TOTAL) BY MOUTH DAILY WITH BREAKFAST.  . Multiple Vitamins-Minerals (PRESERVISION AREDS 2 PO) Take 1 capsule by mouth 2 (two) times daily.  . ondansetron (ZOFRAN) 8 MG tablet TAKE 1 TABLET BY MOUTH EVERY 8 HOURS AS NEEDED FOR NAUSEA AND VOMITING  . pregabalin (LYRICA) 75 MG capsule Take 1 capsule (75 mg) po as needed for pain - start with bid prn for one week then can increase to tid prn if needed .  Marland Kitchen QUEtiapine (SEROQUEL) 200 MG tablet TAKE 1 TABLET(200 MG TOTAL) BY MOUTH AT BEDTIME.  Marland Kitchen sertraline (ZOLOFT) 100 MG tablet Take 2 tablets (200 mg total) by mouth daily.  . varenicline (CHANTIX) 1 MG tablet TAKE 1 TABLET BY MOUTH TWICE DAILY( EVERY TWELVE HOURS)  . [DISCONTINUED] azithromycin (ZITHROMAX) 250 MG tablet 2 tabs po on Day 1, then 1 tab daily Days 2 - 5  . [DISCONTINUED] colchicine 0.6 MG tablet TAKE 1 TO 2 TABLETS(0.6 TO 1.2 MG) BY MOUTH DAILY  . [DISCONTINUED] pregabalin (LYRICA) 50 MG capsule TAKE 1 CAPSULE BY MOUTH TWICE DAILY    Allergies  Allergen Reactions  .  Bee Venom Anaphylaxis  . Iodinated Diagnostic Agents Other (See Comments)    Per patient cardiac arrest IVP DYE-CARDIAC ARREST  . Penicillins Anaphylaxis    Has patient had a PCN reaction causing immediate rash, facial/tongue/throat swelling, SOB or lightheadedness with hypotension: yes Has patient had a PCN reaction causing severe rash involving mucus membranes or skin necrosis: no Has patient had a PCN reaction that required hospitalization: yes Has patient had a PCN reaction occurring within the last 10 years: no If all of the above answers are "NO", then may proceed with Cephalosporin use.   . Povidone Iodine Itching and Rash  . Tomato Itching  . Chocolate Rash  . Latex Rash  . Other Rash  . Atorvastatin Calcium Nausea Only  . Esomeprazole Magnesium Nausea Only  . Gluten Meal   . Metformin And Related Diarrhea  . Quetiapine Fumarate     Per pt she can not take XR  . Trazodone And Nefazodone Hives  . Zolpidem Tartrate Nausea And Vomiting       Review of Systems:  Constitutional: No fever/shills  HEENT: No  headache, no vision change  Cardiac: No  chest pain, No  pressure, No palpitations  Respiratory:  No  shortness of breath. No  Cough  Gastrointestinal: No  abdominal pain  Musculoskeletal: +new myalgia/arthralgia  Neurologic: No  weakness, No  Dizziness  Exam:  BP (!) 119/58 (BP Location: Left Arm, Patient Position: Sitting, Cuff Size: Normal)   Pulse 98   Temp 98.3 F (36.8 C) (Oral)   Wt 206 lb (93.4 kg)   LMP 09/21/1974   BMI 36.49 kg/m   Constitutional: VS see above. General Appearance: alert, well-developed, well-nourished, NAD  Eyes: Normal lids and conjunctive, non-icteric sclera  Ears, Nose, Mouth, Throat: MMM, Normal external inspection ears/nares/mouth/lips/gums.  Neck: No masses,  trachea midline.   Respiratory: Normal respiratory effort. no wheeze, no rhonchi, no rales  Cardiovascular: S1/S2 normal, no rub/gallop auscultated. RRR.    Musculoskeletal: Gait normal. Symmetric and independent movement of all extremities  Neurological: Normal balance/coordination. No tremor.  Skin: warm, dry, intact.   Psychiatric: Normal judgment/insight. Normal mood and affect. Oriented x3.       Visit summary with medication list and pertinent instructions was printed for patient to review, patient was advised to alert [place] if any updates are needed. All questions at time of visit were answered - patient instructed to contact office with any additional concerns. ER/RTC precautions were reviewed with the patient and understanding verbalized.   Note: Total time spent 25 minutes, greater than 50% of the visit was spent face-to-face counseling and coordinating care for the following: The primary encounter diagnosis was Abnormal urine color. Diagnoses of Pseudogout of left knee, Trochanteric bursitis of left hip, Pain of back and left lower extremity, and Neuropathy were also pertinent to this visit.Marland Kitchen  Please note: voice recognition software was used to produce this document, and typos may escape review. Please contact Dr. Sheppard Coil for any needed clarifications.    Follow up plan: Return in about 3 months (around 11/15/2018), or sooner if symptoms worsen or fail to improve, for recheck diabetes .

## 2018-08-17 LAB — URINE CULTURE
MICRO NUMBER:: 333621
SPECIMEN QUALITY:: ADEQUATE

## 2018-08-17 LAB — URINALYSIS, MICROSCOPIC ONLY: Hyaline Cast: NONE SEEN /LPF

## 2018-08-19 ENCOUNTER — Other Ambulatory Visit: Payer: Self-pay | Admitting: Physician Assistant

## 2018-08-19 DIAGNOSIS — E118 Type 2 diabetes mellitus with unspecified complications: Secondary | ICD-10-CM

## 2018-08-19 DIAGNOSIS — R809 Proteinuria, unspecified: Principal | ICD-10-CM

## 2018-08-19 DIAGNOSIS — Z794 Long term (current) use of insulin: Secondary | ICD-10-CM

## 2018-08-19 DIAGNOSIS — E1129 Type 2 diabetes mellitus with other diabetic kidney complication: Secondary | ICD-10-CM

## 2018-08-20 MED ORDER — NITROFURANTOIN MONOHYD MACRO 100 MG PO CAPS: 100.0000 mg | ORAL_CAPSULE | Freq: Two times a day (BID) | ORAL | 0 refills | Status: DC

## 2018-08-20 NOTE — Addendum Note (Signed)
Addended by: Maryla Morrow on: 08/20/2018 10:01 AM   Modules accepted: Orders

## 2018-08-21 ENCOUNTER — Other Ambulatory Visit: Payer: Self-pay | Admitting: Osteopathic Medicine

## 2018-08-21 DIAGNOSIS — R109 Unspecified abdominal pain: Secondary | ICD-10-CM

## 2018-08-24 ENCOUNTER — Encounter: Payer: Self-pay | Admitting: Family Medicine

## 2018-08-24 ENCOUNTER — Other Ambulatory Visit: Payer: Self-pay

## 2018-08-24 ENCOUNTER — Ambulatory Visit (INDEPENDENT_AMBULATORY_CARE_PROVIDER_SITE_OTHER): Payer: Medicare HMO

## 2018-08-24 ENCOUNTER — Ambulatory Visit (INDEPENDENT_AMBULATORY_CARE_PROVIDER_SITE_OTHER): Payer: Medicare HMO | Admitting: Family Medicine

## 2018-08-24 VITALS — BP 106/52 | HR 78 | Temp 98.0°F | Wt 206.0 lb

## 2018-08-24 DIAGNOSIS — M549 Dorsalgia, unspecified: Secondary | ICD-10-CM

## 2018-08-24 DIAGNOSIS — M25552 Pain in left hip: Secondary | ICD-10-CM

## 2018-08-24 DIAGNOSIS — M79605 Pain in left leg: Secondary | ICD-10-CM

## 2018-08-24 DIAGNOSIS — R262 Difficulty in walking, not elsewhere classified: Secondary | ICD-10-CM

## 2018-08-24 DIAGNOSIS — M7062 Trochanteric bursitis, left hip: Secondary | ICD-10-CM | POA: Diagnosis not present

## 2018-08-24 DIAGNOSIS — N3000 Acute cystitis without hematuria: Secondary | ICD-10-CM | POA: Diagnosis not present

## 2018-08-24 MED ORDER — AMBULATORY NON FORMULARY MEDICATION: 99 refills | Status: DC

## 2018-08-24 MED ORDER — HYDROCODONE-ACETAMINOPHEN 5-325 MG PO TABS: 1.0000 | ORAL_TABLET | Freq: Four times a day (QID) | ORAL | 0 refills | Status: DC | PRN

## 2018-08-24 NOTE — Patient Instructions (Signed)
Thank you for coming in today. You should hear about home health PT.  Get a walker. Someone at your facility probably has one.  Use Norco sparingly.  Recheck via phone or WebEx call in 2 weeks.   Return sooner if needed.

## 2018-08-24 NOTE — Progress Notes (Signed)
Autumn Bowen is a 73 y.o. female who presents to [place] today for left hip pain.  Autumn Bowen fell at home on 09-07-2022 landing on her buttocks.  She notes she is been having progressively worsening pain in her left buttocks and lateral hip.  She notes significant difficulty walking and pain with ambulation.  She denies significant pain radiating below the level of her hip.  She is tried some over-the-counter medications for pain which have helped only a little.  She denies any fevers or chills nausea vomiting diarrhea cough congestion runny nose.  Also note she is a bit distressed as her beloved dog died on September 07, 2022 as well.  Additionally she notes that she has been treated several times for urinary tract infection.  Her last visit for this was on March 18.  She was prescribed nitrofurantoin on March 23.  She notes that she is feeling better but still has symptoms and is worried that her infection is still present.    ROS:  As above  Exam:  BP (!) 106/52   Pulse 78   Temp 98 F (36.7 C) (Oral)   Wt 206 lb (93.4 kg)   LMP 09/21/1974   BMI 36.49 kg/m   Wt Readings from Last 5 Encounters:  08/24/18 206 lb (93.4 kg)  08/15/18 206 lb (93.4 kg)  08/01/18 208 lb (94.3 kg)  07/31/18 209 lb 8 oz (95 kg)  07/23/18 209 lb (94.8 kg)   General: Well Developed, well nourished, and in no acute distress.  Neuro/Psych: Alert and oriented x3, extra-ocular muscles intact, able to move all 4 extremities, sensation grossly intact. Skin: Warm and dry, no rashes noted.  Respiratory: Not using accessory muscles, speaking in full sentences, trachea midline.  Cardiovascular: Pulses palpable, no extremity edema. Abdomen: Does not appear distended. MSK:  L-spine: Nontender to spinal midline normal motion. Left hip: Normal-appearing normal motion.  Tender palpation greater trochanter. Difficulty with gait.    Lab and Radiology Results No results found  for this or any previous visit (from the past 72 hour(s)). Dg Hip Unilat W Or W/o Pelvis 2-3 Views Left  Result Date: 08/24/2018 CLINICAL DATA:  Pt with Lt hip pain since fall on 09-07-22. EXAM: DG HIP (WITH OR WITHOUT PELVIS) 2-3V LEFT COMPARISON:  07/31/2018 FINDINGS: There is no evidence of hip fracture or dislocation. There is no evidence of arthropathy or other focal bone abnormality. Aortoiliac atheromatous calcifications. Spondylitic changes in the lower lumbar spine. IMPRESSION: Negative left hip Electronically Signed   By: Lucrezia Europe M.D.   On: 08/24/2018 10:57    I personally (independently) visualized and performed the interpretation of the images attached in this note.    Assessment and Plan: 73 y.o. female with left lateral hip pain.  Likely worsening greater trochanteric bursitis due to recent fall.  No fractures visible.  Ideally would plan for home health physical therapy. We will additionally use a walker.  Limited hydrocodone for pain control.  Will recheck via virtual visit in about 2 weeks.  Return sooner if needed.  Limit health contact exposure due to COVID-19 pandemic.  PDMP reviewed during this encounter. Orders Placed This Encounter  Procedures  . Urine Culture  . DG Hip Unilat W OR W/O Pelvis 2-3 Views Left    Standing Status:   Future    Number of Occurrences:   1    Standing Expiration Date:   10/24/2019    Order Specific Question:  Reason for Exam (SYMPTOM  OR DIAGNOSIS REQUIRED)    Answer:   eval left lateral hip and pelvis pain after fall    Order Specific Question:   Preferred imaging location?    Answer:   [facility]    Order Specific Question:   Radiology Contrast Protocol - do NOT remove file path    Answer:   [email or web address]  . Ambulatory referral to Home Health    Referral Priority:   Routine    Referral Type:   Home Health Care    Referral Reason:   Specialty Services Required     Requested Specialty:   [place]    Number of Visits Requested:   1   Meds ordered this encounter  Medications  . AMBULATORY NON FORMULARY MEDICATION    Sig: Standard walker use as needed. R26.2 M54.9 Disp 1    Dispense:  1 Units    Refill:  prn  . HYDROcodone-acetaminophen (NORCO/VICODIN) 5-325 MG tablet    Sig: Take 1 tablet by mouth every 6 (six) hours as needed.    Dispense:  15 tablet    Refill:  0    Historical information moved to improve visibility of documentation.  Past Medical History:  Diagnosis Date  . Allergy    Notes that grass and some trees causes eyes to sting, burn, and water.  . Anemia   . Celiac disease   . Celiac disease   . COPD (chronic obstructive pulmonary disease) ([place])   . Diabetes mellitus type II   . Diverticulitis   . Dyslipidemia   . HOH (hard of hearing)   . Hypertension   . Hypothyroidism   . Sleep apnea   . Sore throat    Past Surgical History:  Procedure Laterality Date  . APPENDECTOMY    . BREAST SURGERY     h/o benign cystleft breast and lymp node removal on right breast area.  Marland Kitchen EYE SURGERY     Left eye cataract removal, right eye h/o macular degeneration.  . INTRAVASCULAR PRESSURE WIRE/FFR STUDY N/A 12/07/2017   Procedure: INTRAVASCULAR PRESSURE WIRE/FFR STUDY;  Surgeon: Leonie Man, MD;  Location: [facility];  Service: Cardiovascular;  Laterality: N/A;  . LEFT HEART CATH AND CORONARY ANGIOGRAPHY N/A 12/07/2017   Procedure: LEFT HEART CATH AND CORONARY ANGIOGRAPHY;  Surgeon: Leonie Man, MD;  Location: [facility];  Service: Cardiovascular;  Laterality: N/A;  . SPINE SURGERY     Pt not sure of the type of surgery she had but knows it was L4-5.  Marland Kitchen VAGINAL HYSTERECTOMY     Social History   Tobacco Use  . Smoking status: Current Some Day Smoker    Packs/day: 0.25    Years: 51.00    Pack years: 12.75    Types: Cigarettes    Start date: 01/02/2012  . Smokeless tobacco: Never Used  . Tobacco  comment: Been down to 4 - 6 cigs a day  Substance Use Topics  . Alcohol use: No   family history includes Alcohol abuse in her brother; Anxiety disorder in her mother; Asthma in her son; COPD in her father and sister; Cancer in her sister; Depression in her mother; Diabetes in her father and son; Diabetes type II in her mother; Drug abuse in her brother; Glaucoma in her father; Hashimoto's thyroiditis in her sister; Heart attack in her mother; Hypertension in her father; Kidney disease in her sister; Stroke in her mother.  Medications: Current Outpatient  Medications  Medication Sig Dispense Refill  . ACCU-CHEK AVIVA PLUS test strip TEST 1-2 TIMES DAILY 100 each 1  . acetaminophen (TYLENOL) 500 MG tablet Take 1,000 mg by mouth every 6 (six) hours as needed for mild pain or moderate pain.    Marland Kitchen albuterol (PROVENTIL HFA;VENTOLIN HFA) 108 (90 Base) MCG/ACT inhaler Inhale 2 puffs into the lungs every 4 (four) hours as needed for wheezing. 2 Inhaler 11  . AMBULATORY NON FORMULARY MEDICATION Glucometer, test strips and lancets for testing once a day for DM, uncontrolled. 100 strip 0  . AMBULATORY NON FORMULARY MEDICATION Knee-high, medium compression, graduated compression stockings. Apply to lower extremities. Disp per patient preference and insurance coverage. 10 each prn  . AMBULATORY NON FORMULARY MEDICATION Standard walker use as needed. R26.2 M54.9 Disp 1 1 Units prn  . amitriptyline (ELAVIL) 50 MG tablet TAKE 1 TABLET BY MOUTH EVERY NIGHT AT BEDTIME 90 tablet 3  . aspirin EC 81 MG tablet Take 1 tablet (81 mg total) by mouth daily. 90 tablet 3  . BD PEN NEEDLE NANO U/F 32G X 4 MM MISC USE AS DIRECTED 100 each 3  . cholecalciferol (VITAMIN D) 1000 units tablet Take 1,000 Units by mouth daily.    . colchicine 0.6 MG tablet Take 1 tablet (0.6 mg total) by mouth daily. 90 tablet 3  . diazepam (VALIUM) 5 MG tablet Take 1 tab PO 1 hour before procedure or imaging. 5 tablet 0  . diclofenac sodium  (VOLTAREN) 1 % GEL Apply 4 g topically 4 (four) times daily. To affected joint. 100 g 11  . dicyclomine (BENTYL) 10 MG capsule TAKE ONE CAPSULE BY MOUTH TWICE DAILY 180 capsule 0  . EPINEPHrine (EPIPEN 2-PAK) 0.3 mg/0.3 mL IJ SOAJ injection Inject 0.3 mLs (0.3 mg total) into the muscle as needed for anaphylaxis. 1 Device prn  . famotidine (PEPCID) 20 MG tablet TAKE 1 TABLET(20 MG TOTAL) BY MOUTH DAILY. 90 tablet 3  . Fluticasone-Salmeterol (ADVAIR) 250-50 MCG/DOSE AEPB Inhale 1 puff into the lungs every 12 (twelve) hours. 60 each 11  . GLUCOSAMINE-CHONDROITIN PO Take 1 tablet by mouth 2 (two) times daily.     . Insulin Degludec (TRESIBA FLEXTOUCH) 200 UNIT/ML SOPN Inject 28 Units into the skin at bedtime. Increase by 2 units every 5 days until fasting glucose 90-120.    Marland Kitchen isosorbide mononitrate (IMDUR) 30 MG 24 hr tablet Take 1 tablet (30 mg total) by mouth daily. 90 tablet 3  . JARDIANCE 10 MG TABS tablet TAKE 1 TABLET BY MOUTH DAILY 90 tablet 1  . levocetirizine (XYZAL) 5 MG tablet Take 5 mg by mouth every evening.    Marland Kitchen levothyroxine (SYNTHROID, LEVOTHROID) 150 MCG tablet TAKE 1 TABLET BY MOUTH DAILY BEFORE BREAKFAST 90 tablet 3  . losartan (COZAAR) 25 MG tablet TAKE 1 TABLET(25 MG) BY MOUTH DAILY 30 tablet 5  . LUMIGAN 0.01 % SOLN Instill 1 drop into both eyes every night  3  . metFORMIN (GLUCOPHAGE-XR) 750 MG 24 hr tablet TAKE 1 TABLET(750 MG TOTAL) BY MOUTH DAILY WITH BREAKFAST. 90 tablet 1  . metFORMIN (GLUCOPHAGE-XR) 750 MG 24 hr tablet TAKE 1 TABLET(750 MG TOTAL) BY MOUTH DAILY WITH BREAKFAST. 90 tablet 3  . Multiple Vitamins-Minerals (PRESERVISION AREDS 2 PO) Take 1 capsule by mouth 2 (two) times daily.    . nitrofurantoin, macrocrystal-monohydrate, (MACROBID) 100 MG capsule Take 1 capsule (100 mg total) by mouth 2 (two) times daily. 10 capsule 0  . ondansetron (ZOFRAN) 8 MG tablet  TAKE 1 TABLET BY MOUTH EVERY 8 HOURS AS NEEDED FOR NAUSEA AND VOMITING 20 tablet 2  . pregabalin (LYRICA) 75  MG capsule Take 1 capsule (75 mg) po as needed for pain - start with bid prn for one week then can increase to tid prn if needed . 90 capsule 0  . QUEtiapine (SEROQUEL) 200 MG tablet TAKE 1 TABLET(200 MG TOTAL) BY MOUTH AT BEDTIME. 90 tablet 0  . sertraline (ZOLOFT) 100 MG tablet Take 2 tablets (200 mg total) by mouth daily. 120 tablet 3  . varenicline (CHANTIX) 1 MG tablet TAKE 1 TABLET BY MOUTH TWICE DAILY( EVERY TWELVE HOURS) 56 tablet 3  . fluconazole (DIFLUCAN) 150 MG tablet     . HYDROcodone-acetaminophen (NORCO/VICODIN) 5-325 MG tablet Take 1 tablet by mouth every 6 (six) hours as needed. 15 tablet 0  . nitroGLYCERIN (NITROSTAT) 0.4 MG SL tablet Place 1 tablet (0.4 mg total) under the tongue every 5 (five) minutes as needed for chest pain. 25 tablet 11  . rosuvastatin (CRESTOR) 40 MG tablet Take 1 tablet (40 mg total) by mouth daily. 90 tablet 3   No current facility-administered medications for this visit.    Allergies  Allergen Reactions  . Bee Venom Anaphylaxis  . Iodinated Diagnostic Agents Other (See Comments)    Per patient cardiac arrest IVP DYE-CARDIAC ARREST  . Penicillins Anaphylaxis    Has patient had a PCN reaction causing immediate rash, facial/tongue/throat swelling, SOB or lightheadedness with hypotension: yes Has patient had a PCN reaction causing severe rash involving mucus membranes or skin necrosis: no Has patient had a PCN reaction that required hospitalization: yes Has patient had a PCN reaction occurring within the last 10 years: no If all of the above answers are "NO", then may proceed with Cephalosporin use.   . Povidone Iodine Itching and Rash  . Tomato Itching  . Chocolate Rash  . Latex Rash  . Other Rash  . Atorvastatin Calcium Nausea Only  . Esomeprazole Magnesium Nausea Only  . Gluten Meal   . Metformin And Related Diarrhea  . Quetiapine Fumarate     Per pt she can not take XR  . Trazodone And Nefazodone Hives  . Zolpidem Tartrate Nausea And  Vomiting      Discussed warning signs or symptoms. Please see discharge instructions. Patient expresses understanding.

## 2018-08-25 DIAGNOSIS — M549 Dorsalgia, unspecified: Secondary | ICD-10-CM | POA: Diagnosis not present

## 2018-08-25 DIAGNOSIS — M25552 Pain in left hip: Secondary | ICD-10-CM | POA: Diagnosis not present

## 2018-08-25 DIAGNOSIS — E039 Hypothyroidism, unspecified: Secondary | ICD-10-CM | POA: Diagnosis not present

## 2018-08-25 DIAGNOSIS — J449 Chronic obstructive pulmonary disease, unspecified: Secondary | ICD-10-CM | POA: Diagnosis not present

## 2018-08-25 DIAGNOSIS — Z7982 Long term (current) use of aspirin: Secondary | ICD-10-CM | POA: Diagnosis not present

## 2018-08-25 DIAGNOSIS — E119 Type 2 diabetes mellitus without complications: Secondary | ICD-10-CM | POA: Diagnosis not present

## 2018-08-25 DIAGNOSIS — E785 Hyperlipidemia, unspecified: Secondary | ICD-10-CM | POA: Diagnosis not present

## 2018-08-25 DIAGNOSIS — G4730 Sleep apnea, unspecified: Secondary | ICD-10-CM | POA: Diagnosis not present

## 2018-08-25 DIAGNOSIS — I10 Essential (primary) hypertension: Secondary | ICD-10-CM | POA: Diagnosis not present

## 2018-08-25 LAB — URINE CULTURE
MICRO NUMBER:: 358759
Result:: NO GROWTH
SPECIMEN QUALITY:: ADEQUATE

## 2018-08-30 DIAGNOSIS — G4730 Sleep apnea, unspecified: Secondary | ICD-10-CM | POA: Diagnosis not present

## 2018-08-30 DIAGNOSIS — M549 Dorsalgia, unspecified: Secondary | ICD-10-CM | POA: Diagnosis not present

## 2018-08-30 DIAGNOSIS — J449 Chronic obstructive pulmonary disease, unspecified: Secondary | ICD-10-CM | POA: Diagnosis not present

## 2018-08-30 DIAGNOSIS — M25552 Pain in left hip: Secondary | ICD-10-CM | POA: Diagnosis not present

## 2018-08-30 DIAGNOSIS — E785 Hyperlipidemia, unspecified: Secondary | ICD-10-CM | POA: Diagnosis not present

## 2018-08-30 DIAGNOSIS — E039 Hypothyroidism, unspecified: Secondary | ICD-10-CM | POA: Diagnosis not present

## 2018-08-30 DIAGNOSIS — I10 Essential (primary) hypertension: Secondary | ICD-10-CM | POA: Diagnosis not present

## 2018-08-30 DIAGNOSIS — Z7982 Long term (current) use of aspirin: Secondary | ICD-10-CM | POA: Diagnosis not present

## 2018-08-30 DIAGNOSIS — E119 Type 2 diabetes mellitus without complications: Secondary | ICD-10-CM | POA: Diagnosis not present

## 2018-08-31 DIAGNOSIS — Z7982 Long term (current) use of aspirin: Secondary | ICD-10-CM | POA: Diagnosis not present

## 2018-08-31 DIAGNOSIS — J449 Chronic obstructive pulmonary disease, unspecified: Secondary | ICD-10-CM | POA: Diagnosis not present

## 2018-08-31 DIAGNOSIS — G4730 Sleep apnea, unspecified: Secondary | ICD-10-CM | POA: Diagnosis not present

## 2018-08-31 DIAGNOSIS — E039 Hypothyroidism, unspecified: Secondary | ICD-10-CM | POA: Diagnosis not present

## 2018-08-31 DIAGNOSIS — E119 Type 2 diabetes mellitus without complications: Secondary | ICD-10-CM | POA: Diagnosis not present

## 2018-08-31 DIAGNOSIS — M25552 Pain in left hip: Secondary | ICD-10-CM | POA: Diagnosis not present

## 2018-08-31 DIAGNOSIS — M549 Dorsalgia, unspecified: Secondary | ICD-10-CM | POA: Diagnosis not present

## 2018-08-31 DIAGNOSIS — E785 Hyperlipidemia, unspecified: Secondary | ICD-10-CM | POA: Diagnosis not present

## 2018-08-31 DIAGNOSIS — I10 Essential (primary) hypertension: Secondary | ICD-10-CM | POA: Diagnosis not present

## 2018-09-03 ENCOUNTER — Encounter: Payer: Self-pay | Admitting: Family Medicine

## 2018-09-03 ENCOUNTER — Other Ambulatory Visit: Payer: Self-pay

## 2018-09-03 ENCOUNTER — Ambulatory Visit (INDEPENDENT_AMBULATORY_CARE_PROVIDER_SITE_OTHER): Payer: Medicare HMO | Admitting: Family Medicine

## 2018-09-03 VITALS — BP 107/67 | HR 94 | Wt 204.0 lb

## 2018-09-03 DIAGNOSIS — G4730 Sleep apnea, unspecified: Secondary | ICD-10-CM | POA: Diagnosis not present

## 2018-09-03 DIAGNOSIS — M25552 Pain in left hip: Secondary | ICD-10-CM | POA: Diagnosis not present

## 2018-09-03 DIAGNOSIS — R262 Difficulty in walking, not elsewhere classified: Secondary | ICD-10-CM

## 2018-09-03 DIAGNOSIS — E039 Hypothyroidism, unspecified: Secondary | ICD-10-CM | POA: Diagnosis not present

## 2018-09-03 DIAGNOSIS — Z7982 Long term (current) use of aspirin: Secondary | ICD-10-CM | POA: Diagnosis not present

## 2018-09-03 DIAGNOSIS — I10 Essential (primary) hypertension: Secondary | ICD-10-CM | POA: Diagnosis not present

## 2018-09-03 DIAGNOSIS — E119 Type 2 diabetes mellitus without complications: Secondary | ICD-10-CM | POA: Diagnosis not present

## 2018-09-03 DIAGNOSIS — E785 Hyperlipidemia, unspecified: Secondary | ICD-10-CM | POA: Diagnosis not present

## 2018-09-03 DIAGNOSIS — M549 Dorsalgia, unspecified: Secondary | ICD-10-CM | POA: Diagnosis not present

## 2018-09-03 DIAGNOSIS — J449 Chronic obstructive pulmonary disease, unspecified: Secondary | ICD-10-CM | POA: Diagnosis not present

## 2018-09-03 DIAGNOSIS — M5432 Sciatica, left side: Secondary | ICD-10-CM | POA: Diagnosis not present

## 2018-09-03 MED ORDER — GABAPENTIN 300 MG PO CAPS: ORAL_CAPSULE | ORAL | 3 refills | Status: DC

## 2018-09-03 NOTE — Progress Notes (Signed)
Autumn Bowen is a 73 y.o. female who presents to [place] today for left lateral hip and leg pain.  Autumn Bowen has been seen several times for left leg and lateral hip pain.  She was most recently seen on March 27.  She had fallen and landed on her left leg.  She had a prior history of significant trochanteric bursitis on that same side.  Her pain had worsened significantly and she was referred to physical therapy.  She was referred to home health physical therapy and has had 1 session so far.  She also has started receiving home health nursing and social work which she finds to be very helpful.  She notes her leg is not any better but notes that she is only had 1 session.  She notes some leg weakness and notes that she has fallen.  She fell and hit her head a few days ago.  She denies any loss of consciousness lightheadedness or dizziness.  She thinks her thinking is the same as it has been and would like to avoid CT scan if possible.    ROS:  As above  Exam:  BP 107/67   Pulse 94   Wt 204 lb (92.5 kg)   LMP 09/21/1974   BMI 36.14 kg/m  Wt Readings from Last 5 Encounters:  09/03/18 204 lb (92.5 kg)  08/24/18 206 lb (93.4 kg)  08/15/18 206 lb (93.4 kg)  08/01/18 208 lb (94.3 kg)  07/31/18 209 lb 8 oz (95 kg)   General: Well Developed, well nourished, and in no acute distress.  Neuro/Psych: Alert and oriented x3, extra-ocular muscles intact, able to move all 4 extremities, sensation grossly intact. Skin: Warm and dry, no rashes noted.  Respiratory: Not using accessory muscles, speaking in full sentences, trachea midline.  Cardiovascular: Pulses palpable, no extremity edema. Abdomen: Does not appear distended. MSK:  Left hip normal-appearing normal motion.  Tender palpation greater trochanter. L-spine: Nontender midline.  Normal leg motion.  Negative slump test.    Lab and Radiology Results  EXAM: DG HIP (WITH OR WITHOUT PELVIS)  2-3V LEFT  COMPARISON:  07/31/2018  FINDINGS: There is no evidence of hip fracture or dislocation. There is no evidence of arthropathy or other focal bone abnormality. Aortoiliac atheromatous calcifications. Spondylitic changes in the lower lumbar spine.  IMPRESSION: Negative left hip   Electronically Signed   By: Lucrezia Europe M.D.   On: 08/24/2018 10:57 I personally (independently) visualized and performed the interpretation of the images attached in this note.    Assessment and Plan: 73 y.o. female with  Left lateral hip pain mostly likely due to trochanteric bursitis.  Plan for home exercise program and home health physical therapy.  Patient may have a component of S1 radiculopathy as well.  Again physical therapy may be helpful here.  He may benefit from gabapentin for some pain control as well.  Plan to recheck via phone call or my chart visit in 2 weeks for reassessment.  I spent 25 minutes with this patient, greater than 50% was face-to-face time counseling regarding differential diagnosis treatment plan and options and methods to avoid risk for COVID-19 infection.Marland Kitchen      PDMP not reviewed this encounter. No orders of the defined types were placed in this encounter.  Meds ordered this encounter  Medications  . gabapentin (NEURONTIN) 300 MG capsule    Sig: One tab PO qHS for a week, then BID for a week, then TID.  May double weekly to a max of 3,635m/day    Dispense:  180 capsule    Refill:  3    Historical information moved to improve visibility of documentation.  Past Medical History:  Diagnosis Date  . Allergy    Notes that grass and some trees causes eyes to sting, burn, and water.  . Anemia   . Celiac disease   . Celiac disease   . COPD [place] obstructive pulmonary disease) ([place]   . Diabetes mellitus type II   . Diverticulitis   . Dyslipidemia   . HOH (hard of hearing)   . Hypertension   . Hypothyroidism   . Sleep apnea   . Sore throat     Past Surgical History:  Procedure Laterality Date  . APPENDECTOMY    . BREAST SURGERY     h/o benign cystleft breast and lymp node removal on right breast area.  .Marland KitchenEYE SURGERY     Left eye cataract removal, right eye h/o macular degeneration.  . INTRAVASCULAR PRESSURE WIRE/FFR STUDY N/A 12/07/2017   Procedure: INTRAVASCULAR PRESSURE WIRE/FFR STUDY;  Surgeon: HLeonie Man MD;  [facility];  Service: Cardiovascular;  Laterality: N/A;  . LEFT HEART CATH AND CORONARY ANGIOGRAPHY N/A 12/07/2017   Procedure: LEFT HEART CATH AND CORONARY ANGIOGRAPHY;  Surgeon: HLeonie Man MD;  [facility];  Service: Cardiovascular;  Laterality: N/A;  . SPINE SURGERY     Pt not sure of the type of surgery she had but knows it was L4-5.  .Marland KitchenVAGINAL HYSTERECTOMY     Social History   Tobacco Use  . Smoking status: Current Some Day Smoker    Packs/day: 0.25    Years: 51.00    Pack years: 12.75    Types: Cigarettes    Start date: 01/02/2012  . Smokeless tobacco: Never Used  . Tobacco comment: Been down to 4 - 6 cigs a day  Substance Use Topics  . Alcohol use: No   family history includes Alcohol abuse in her brother; Anxiety disorder in her mother; Asthma in her son; COPD in her father and sister; Cancer in her sister; Depression in her mother; Diabetes in her father and son; Diabetes type II in her mother; Drug abuse in her brother; Glaucoma in her father; Hashimoto's thyroiditis in her sister; Heart attack in her mother; Hypertension in her father; Kidney disease in her sister; Stroke in her mother.  Medications: Current Outpatient Medications  Medication Sig Dispense Refill  . ACCU-CHEK AVIVA PLUS test strip TEST 1-2 TIMES DAILY 100 each 1  . acetaminophen (TYLENOL) 500 MG tablet Take 1,000 mg by mouth every 6 (six) hours as needed for mild pain or moderate pain.    .Marland Kitchenalbuterol (PROVENTIL HFA;VENTOLIN HFA) 108 (90 Base) MCG/ACT inhaler Inhale 2 puffs into the lungs  every 4 (four) hours as needed for wheezing. 2 Inhaler 11  . AMBULATORY NON FORMULARY MEDICATION Glucometer, test strips and lancets for testing once a day for DM, uncontrolled. 100 strip 0  . AMBULATORY NON FORMULARY MEDICATION Knee-high, medium compression, graduated compression stockings. Apply to lower extremities. Disp per patient preference and insurance coverage. 10 each prn  . AMBULATORY NON FORMULARY MEDICATION Standard walker use as needed. R26.2 M54.9 Disp 1 1 Units prn  . amitriptyline (ELAVIL) 50 MG tablet TAKE 1 TABLET BY MOUTH EVERY NIGHT AT BEDTIME 90 tablet 3  . aspirin EC 81 MG tablet Take 1 tablet (81 mg total) by mouth daily.  90 tablet 3  . BD PEN NEEDLE NANO U/F 32G X 4 MM MISC USE AS DIRECTED 100 each 3  . cholecalciferol (VITAMIN D) 1000 units tablet Take 1,000 Units by mouth daily.    . colchicine 0.6 MG tablet Take 1 tablet (0.6 mg total) by mouth daily. 90 tablet 3  . diazepam (VALIUM) 5 MG tablet Take 1 tab PO 1 hour before procedure or imaging. 5 tablet 0  . diclofenac sodium (VOLTAREN) 1 % GEL Apply 4 g topically 4 (four) times daily. To affected joint. 100 g 11  . dicyclomine (BENTYL) 10 MG capsule TAKE ONE CAPSULE BY MOUTH TWICE DAILY 180 capsule 0  . EPINEPHrine (EPIPEN 2-PAK) 0.3 mg/0.3 mL IJ SOAJ injection Inject 0.3 mLs (0.3 mg total) into the muscle as needed for anaphylaxis. 1 Device prn  . famotidine (PEPCID) 20 MG tablet TAKE 1 TABLET(20 MG TOTAL) BY MOUTH DAILY. 90 tablet 3  . fluconazole (DIFLUCAN) 150 MG tablet     . Fluticasone-Salmeterol (ADVAIR) 250-50 MCG/DOSE AEPB Inhale 1 puff into the lungs every 12 (twelve) hours. 60 each 11  . GLUCOSAMINE-CHONDROITIN PO Take 1 tablet by mouth 2 (two) times daily.     Marland Kitchen HYDROcodone-acetaminophen (NORCO/VICODIN) 5-325 MG tablet Take 1 tablet by mouth every 6 (six) hours as needed. 15 tablet 0  . Insulin Degludec (TRESIBA FLEXTOUCH) 200 UNIT/ML SOPN Inject 28 Units into the skin at bedtime. Increase by 2 units  every 5 days until fasting glucose 90-120.    Marland Kitchen isosorbide mononitrate (IMDUR) 30 MG 24 hr tablet Take 1 tablet (30 mg total) by mouth daily. 90 tablet 3  . JARDIANCE 10 MG TABS tablet TAKE 1 TABLET BY MOUTH DAILY 90 tablet 1  . levocetirizine (XYZAL) 5 MG tablet Take 5 mg by mouth every evening.    Marland Kitchen levothyroxine (SYNTHROID, LEVOTHROID) 150 MCG tablet TAKE 1 TABLET BY MOUTH DAILY BEFORE BREAKFAST 90 tablet 3  . losartan (COZAAR) 25 MG tablet TAKE 1 TABLET(25 MG) BY MOUTH DAILY 30 tablet 5  . LUMIGAN 0.01 % SOLN Instill 1 drop into both eyes every night  3  . metFORMIN (GLUCOPHAGE-XR) 750 MG 24 hr tablet TAKE 1 TABLET(750 MG TOTAL) BY MOUTH DAILY WITH BREAKFAST. 90 tablet 1  . metFORMIN (GLUCOPHAGE-XR) 750 MG 24 hr tablet TAKE 1 TABLET(750 MG TOTAL) BY MOUTH DAILY WITH BREAKFAST. 90 tablet 3  . Multiple Vitamins-Minerals (PRESERVISION AREDS 2 PO) Take 1 capsule by mouth 2 (two) times daily.    . nitrofurantoin, macrocrystal-monohydrate, (MACROBID) 100 MG capsule Take 1 capsule (100 mg total) by mouth 2 (two) times daily. 10 capsule 0  . ondansetron (ZOFRAN) 8 MG tablet TAKE 1 TABLET BY MOUTH EVERY 8 HOURS AS NEEDED FOR NAUSEA AND VOMITING 20 tablet 2  . pregabalin (LYRICA) 75 MG capsule Take 1 capsule (75 mg) po as needed for pain - start with bid prn for one week then can increase to tid prn if needed . 90 capsule 0  . QUEtiapine (SEROQUEL) 200 MG tablet TAKE 1 TABLET(200 MG TOTAL) BY MOUTH AT BEDTIME. 90 tablet 0  . sertraline (ZOLOFT) 100 MG tablet Take 2 tablets (200 mg total) by mouth daily. 120 tablet 3  . varenicline (CHANTIX) 1 MG tablet TAKE 1 TABLET BY MOUTH TWICE DAILY( EVERY TWELVE HOURS) 56 tablet 3  . gabapentin (NEURONTIN) 300 MG capsule One tab PO qHS for a week, then BID for a week, then TID. May double weekly to a max of 3,676m/day 180 capsule 3  .  nitroGLYCERIN (NITROSTAT) 0.4 MG SL tablet Place 1 tablet (0.4 mg total) under the tongue every 5 (five) minutes as needed for chest  pain. 25 tablet 11  . rosuvastatin (CRESTOR) 40 MG tablet Take 1 tablet (40 mg total) by mouth daily. 90 tablet 3   No current facility-administered medications for this visit.    Allergies  Allergen Reactions  . Bee Venom Anaphylaxis  . Iodinated Diagnostic Agents Other (See Comments)    Per patient cardiac arrest IVP DYE-CARDIAC ARREST  . Penicillins Anaphylaxis    Has patient had a PCN reaction causing immediate rash, facial/tongue/throat swelling, SOB or lightheadedness with hypotension: yes Has patient had a PCN reaction causing severe rash involving mucus membranes or skin necrosis: no Has patient had a PCN reaction that required hospitalization: yes Has patient had a PCN reaction occurring within the last 10 years: no If all of the above answers are "NO", then may proceed with Cephalosporin use.   . Povidone Iodine Itching and Rash  . Tomato Itching  . Chocolate Rash  . Latex Rash  . Other Rash  . Atorvastatin Calcium Nausea Only  . Esomeprazole Magnesium Nausea Only  . Gluten Meal   . Metformin And Related Diarrhea  . Quetiapine Fumarate     Per pt she can not take XR  . Trazodone And Nefazodone Hives  . Zolpidem Tartrate Nausea And Vomiting      Discussed warning signs or symptoms. Please see discharge instructions. Patient expresses understanding.

## 2018-09-03 NOTE — Patient Instructions (Addendum)
Thank you for coming in today.  Please continue to work with physical therapy for your hip.   Call or go to the emergency room if you get worse, have trouble breathing, have chest pains, or palpitations.   Add gabapentin for nerve pain.   Lets recheck by phone call in 2 weeks.   Continue physical therapy.   Use your walker.

## 2018-09-04 ENCOUNTER — Other Ambulatory Visit: Payer: Self-pay | Admitting: Osteopathic Medicine

## 2018-09-05 DIAGNOSIS — J449 Chronic obstructive pulmonary disease, unspecified: Secondary | ICD-10-CM | POA: Diagnosis not present

## 2018-09-05 DIAGNOSIS — E119 Type 2 diabetes mellitus without complications: Secondary | ICD-10-CM | POA: Diagnosis not present

## 2018-09-05 DIAGNOSIS — E039 Hypothyroidism, unspecified: Secondary | ICD-10-CM | POA: Diagnosis not present

## 2018-09-05 DIAGNOSIS — G4730 Sleep apnea, unspecified: Secondary | ICD-10-CM | POA: Diagnosis not present

## 2018-09-05 DIAGNOSIS — E785 Hyperlipidemia, unspecified: Secondary | ICD-10-CM | POA: Diagnosis not present

## 2018-09-05 DIAGNOSIS — M549 Dorsalgia, unspecified: Secondary | ICD-10-CM | POA: Diagnosis not present

## 2018-09-05 DIAGNOSIS — M25552 Pain in left hip: Secondary | ICD-10-CM | POA: Diagnosis not present

## 2018-09-05 DIAGNOSIS — Z7982 Long term (current) use of aspirin: Secondary | ICD-10-CM | POA: Diagnosis not present

## 2018-09-05 DIAGNOSIS — I10 Essential (primary) hypertension: Secondary | ICD-10-CM | POA: Diagnosis not present

## 2018-09-05 LAB — BASIC METABOLIC PANEL: Glucose: 137

## 2018-09-07 DIAGNOSIS — J449 Chronic obstructive pulmonary disease, unspecified: Secondary | ICD-10-CM | POA: Diagnosis not present

## 2018-09-07 DIAGNOSIS — M549 Dorsalgia, unspecified: Secondary | ICD-10-CM | POA: Diagnosis not present

## 2018-09-07 DIAGNOSIS — E039 Hypothyroidism, unspecified: Secondary | ICD-10-CM | POA: Diagnosis not present

## 2018-09-07 DIAGNOSIS — G4730 Sleep apnea, unspecified: Secondary | ICD-10-CM | POA: Diagnosis not present

## 2018-09-07 DIAGNOSIS — M25552 Pain in left hip: Secondary | ICD-10-CM | POA: Diagnosis not present

## 2018-09-07 DIAGNOSIS — Z7982 Long term (current) use of aspirin: Secondary | ICD-10-CM | POA: Diagnosis not present

## 2018-09-07 DIAGNOSIS — I10 Essential (primary) hypertension: Secondary | ICD-10-CM | POA: Diagnosis not present

## 2018-09-07 DIAGNOSIS — E785 Hyperlipidemia, unspecified: Secondary | ICD-10-CM | POA: Diagnosis not present

## 2018-09-07 DIAGNOSIS — E119 Type 2 diabetes mellitus without complications: Secondary | ICD-10-CM | POA: Diagnosis not present

## 2018-09-10 ENCOUNTER — Ambulatory Visit (INDEPENDENT_AMBULATORY_CARE_PROVIDER_SITE_OTHER): Payer: Medicare HMO | Admitting: Family Medicine

## 2018-09-10 ENCOUNTER — Telehealth: Payer: Self-pay | Admitting: Osteopathic Medicine

## 2018-09-10 DIAGNOSIS — I10 Essential (primary) hypertension: Secondary | ICD-10-CM | POA: Diagnosis not present

## 2018-09-10 DIAGNOSIS — G4730 Sleep apnea, unspecified: Secondary | ICD-10-CM | POA: Diagnosis not present

## 2018-09-10 DIAGNOSIS — M5432 Sciatica, left side: Secondary | ICD-10-CM

## 2018-09-10 DIAGNOSIS — M25552 Pain in left hip: Secondary | ICD-10-CM | POA: Diagnosis not present

## 2018-09-10 DIAGNOSIS — E785 Hyperlipidemia, unspecified: Secondary | ICD-10-CM | POA: Diagnosis not present

## 2018-09-10 DIAGNOSIS — E039 Hypothyroidism, unspecified: Secondary | ICD-10-CM | POA: Diagnosis not present

## 2018-09-10 DIAGNOSIS — J449 Chronic obstructive pulmonary disease, unspecified: Secondary | ICD-10-CM | POA: Diagnosis not present

## 2018-09-10 DIAGNOSIS — Z7982 Long term (current) use of aspirin: Secondary | ICD-10-CM | POA: Diagnosis not present

## 2018-09-10 DIAGNOSIS — R262 Difficulty in walking, not elsewhere classified: Secondary | ICD-10-CM

## 2018-09-10 DIAGNOSIS — M549 Dorsalgia, unspecified: Secondary | ICD-10-CM | POA: Diagnosis not present

## 2018-09-10 DIAGNOSIS — E119 Type 2 diabetes mellitus without complications: Secondary | ICD-10-CM | POA: Diagnosis not present

## 2018-09-10 MED ORDER — HYDROCODONE-ACETAMINOPHEN 5-325 MG PO TABS: 1.0000 | ORAL_TABLET | Freq: Four times a day (QID) | ORAL | 0 refills | Status: DC | PRN

## 2018-09-10 NOTE — Telephone Encounter (Signed)
Appointment has been made for a telephone call to recheck hip. No further questions at this time.

## 2018-09-10 NOTE — Patient Instructions (Addendum)
Thank you for coming in today. Use hydrocodone for pain at bedtime.  Continue gabapentin.  Continue home health physical therapy.  Recheck in 2 weeks

## 2018-09-10 NOTE — Progress Notes (Addendum)
Virtual Visit  via Phone Note  I connected with      Autumn Bowen  by a telemedicine application and verified that I am speaking with the correct person using two identifiers.   I discussed the limitations of evaluation and management by telemedicine and the availability of in person appointments. The patient expressed understanding and agreed to proceed.  History of Present Illness: Autumn Bowen is a 73 y.o. female who would like to discuss hip pain and sciatica.  [name] was seen on April 6 for left hip pain.  She been having ongoing hip pain thought to be trochanteric bursitis and some pain radiating down her left leg thought to be likely sciatica.  She only had one episode of home physical therapy at the time of last visit.  Since last visit she is had more physical therapy as well as occupational therapy.  She notes this is been helpful and she is happy with how things are going.  She is slowly increasing gabapentin and tolerating it okay.  Additionally she has been using hydrocodone and finds it to be helpful.  She is weaning off of the hydrocodone but notes that she does find it helpful to sleep and would like a little bit more to bridge the gap.       Observations/Objective: LMP 09/21/1974  Wt Readings from Last 5 Encounters:  09/03/18 204 lb (92.5 kg)  08/24/18 206 lb (93.4 kg)  08/15/18 206 lb (93.4 kg)  08/01/18 208 lb (94.3 kg)  07/31/18 209 lb 8 oz (95 kg)   Exam: Normal Speech.  Normal speech thought process and verbal affect.   Assessment and Plan: 73 y.o. female with left hip and leg pain.  Plan to continue home with physical therapy services.  Additionally use limited hydrocodone for pain.  Recheck with me via phone call in about 2 weeks.  Return sooner if needed.  Continue home health services as those are very helpful.  Discussed precautions.  Discussed how to slowly titrate gabapentin as well.  Precautions reviewed.  PDMP reviewed during this  encounter. No orders of the defined types were placed in this encounter.  Meds ordered this encounter  Medications  . HYDROcodone-acetaminophen (NORCO/VICODIN) 5-325 MG tablet    Sig: Take 1 tablet by mouth every 6 (six) hours as needed.    Dispense:  15 tablet    Refill:  0    Follow Up Instructions:    I discussed the assessment and treatment plan with the patient. The patient was provided an opportunity to ask questions and all were answered. The patient agreed with the plan and demonstrated an understanding of the instructions.   The patient was advised to call back or seek an in-person evaluation if the symptoms worsen or if the condition fails to improve as anticipated.  I provided 25 minutes of non-face-to-face time during this encounter.    Historical information moved to improve visibility of documentation.  Past Medical History:  Diagnosis Date  . Allergy    Notes that grass and some trees causes eyes to sting, burn, and water.  . Anemia   . Celiac disease   . Celiac disease   . COPD (chronic obstructive pulmonary disease) ([place])   . Diabetes mellitus type II   . Diverticulitis   . Dyslipidemia   . HOH (hard of hearing)   . Hypertension   . Hypothyroidism   . Sleep apnea   . Sore throat    Past  Surgical History:  Procedure Laterality Date  . APPENDECTOMY    . BREAST SURGERY     h/o benign cystleft breast and lymp node removal on right breast area.  Marland Kitchen EYE SURGERY     Left eye cataract removal, right eye h/o macular degeneration.  . INTRAVASCULAR PRESSURE WIRE/FFR STUDY N/A 12/07/2017   Procedure: INTRAVASCULAR PRESSURE WIRE/FFR STUDY;  Surgeon: Leonie Man, MD;  Location: [facility];  Service: Cardiovascular;  Laterality: N/A;  . LEFT HEART CATH AND CORONARY ANGIOGRAPHY N/A 12/07/2017   Procedure: LEFT HEART CATH AND CORONARY ANGIOGRAPHY;  Surgeon: Leonie Man, MD;  Location: [facility];  Service: Cardiovascular;  Laterality: N/A;  .  SPINE SURGERY     Pt not sure of the type of surgery she had but knows it was L4-5.  Marland Kitchen VAGINAL HYSTERECTOMY     Social History   Tobacco Use  . Smoking status: Current Some Day Smoker    Packs/day: 0.25    Years: 51.00    Pack years: 12.75    Types: Cigarettes    Start date: 01/02/2012  . Smokeless tobacco: Never Used  . Tobacco comment: Been down to 4 - 6 cigs a day  Substance Use Topics  . Alcohol use: No   family history includes Alcohol abuse in her brother; Anxiety disorder in her mother; Asthma in her son; COPD in her father and sister; Cancer in her sister; Depression in her mother; Diabetes in her father and son; Diabetes type II in her mother; Drug abuse in her brother; Glaucoma in her father; Hashimoto's thyroiditis in her sister; Heart attack in her mother; Hypertension in her father; Kidney disease in her sister; Stroke in her mother.  Medications: Current Outpatient Medications  Medication Sig Dispense Refill  . ACCU-CHEK AVIVA PLUS test strip TEST 1-2 TIMES DAILY 100 each 1  . acetaminophen (TYLENOL) 500 MG tablet Take 1,000 mg by mouth every 6 (six) hours as needed for mild pain or moderate pain.    Marland Kitchen albuterol (PROVENTIL HFA;VENTOLIN HFA) 108 (90 Base) MCG/ACT inhaler Inhale 2 puffs into the lungs every 4 (four) hours as needed for wheezing. 2 Inhaler 11  . AMBULATORY NON FORMULARY MEDICATION Glucometer, test strips and lancets for testing once a day for DM, uncontrolled. 100 strip 0  . AMBULATORY NON FORMULARY MEDICATION Knee-high, medium compression, graduated compression stockings. Apply to lower extremities. Disp per patient preference and insurance coverage. 10 each prn  . AMBULATORY NON FORMULARY MEDICATION Standard walker use as needed. R26.2 M54.9 Disp 1 1 Units prn  . amitriptyline (ELAVIL) 50 MG tablet TAKE 1 TABLET BY MOUTH EVERY NIGHT AT BEDTIME 90 tablet 3  . aspirin EC 81 MG tablet Take 1 tablet (81 mg total) by mouth daily. 90 tablet 3  . BD PEN NEEDLE  NANO U/F 32G X 4 MM MISC USE AS DIRECTED 100 each 3  . cholecalciferol (VITAMIN D) 1000 units tablet Take 1,000 Units by mouth daily.    . colchicine 0.6 MG tablet Take 1 tablet (0.6 mg total) by mouth daily. 90 tablet 3  . diazepam (VALIUM) 5 MG tablet Take 1 tab PO 1 hour before procedure or imaging. 5 tablet 0  . diclofenac sodium (VOLTAREN) 1 % GEL Apply 4 g topically 4 (four) times daily. To affected joint. 100 g 11  . dicyclomine (BENTYL) 10 MG capsule TAKE ONE CAPSULE BY MOUTH TWICE DAILY 180 capsule 0  . EPINEPHrine (EPIPEN 2-PAK) 0.3 mg/0.3 mL IJ SOAJ  injection Inject 0.3 mLs (0.3 mg total) into the muscle as needed for anaphylaxis. 1 Device prn  . famotidine (PEPCID) 20 MG tablet TAKE 1 TABLET(20 MG TOTAL) BY MOUTH DAILY. 90 tablet 3  . fluconazole (DIFLUCAN) 150 MG tablet     . Fluticasone-Salmeterol (ADVAIR) 250-50 MCG/DOSE AEPB Inhale 1 puff into the lungs every 12 (twelve) hours. 60 each 11  . gabapentin (NEURONTIN) 300 MG capsule One tab PO qHS for a week, then BID for a week, then TID. May double weekly to a max of 3,657m/day 180 capsule 3  . GLUCOSAMINE-CHONDROITIN PO Take 1 tablet by mouth 2 (two) times daily.     .Marland KitchenHYDROcodone-acetaminophen (NORCO/VICODIN) 5-325 MG tablet Take 1 tablet by mouth every 6 (six) hours as needed. 15 tablet 0  . Insulin Degludec (TRESIBA FLEXTOUCH) 200 UNIT/ML SOPN Inject 28 Units into the skin at bedtime. Increase by 2 units every 5 days until fasting glucose 90-120.    .Marland Kitchenisosorbide mononitrate (IMDUR) 30 MG 24 hr tablet Take 1 tablet (30 mg total) by mouth daily. 90 tablet 3  . JARDIANCE 10 MG TABS tablet TAKE 1 TABLET BY MOUTH DAILY 90 tablet 1  . levocetirizine (XYZAL) 5 MG tablet Take 5 mg by mouth every evening.    .Marland Kitchenlevothyroxine (SYNTHROID, LEVOTHROID) 150 MCG tablet TAKE 1 TABLET BY MOUTH DAILY BEFORE BREAKFAST 90 tablet 3  . losartan (COZAAR) 25 MG tablet TAKE 1 TABLET(25 MG) BY MOUTH DAILY 30 tablet 5  . LUMIGAN 0.01 % SOLN Instill 1 drop  into both eyes every night  3  . metFORMIN (GLUCOPHAGE-XR) 750 MG 24 hr tablet TAKE 1 TABLET(750 MG TOTAL) BY MOUTH DAILY WITH BREAKFAST. 90 tablet 1  . metFORMIN (GLUCOPHAGE-XR) 750 MG 24 hr tablet TAKE 1 TABLET(750 MG TOTAL) BY MOUTH DAILY WITH BREAKFAST. 90 tablet 3  . Multiple Vitamins-Minerals (PRESERVISION AREDS 2 PO) Take 1 capsule by mouth 2 (two) times daily.    . nitrofurantoin, macrocrystal-monohydrate, (MACROBID) 100 MG capsule Take 1 capsule (100 mg total) by mouth 2 (two) times daily. 10 capsule 0  . ondansetron (ZOFRAN) 8 MG tablet TAKE 1 TABLET BY MOUTH EVERY 8 HOURS AS NEEDED FOR NAUSEA AND VOMITING 20 tablet 2  . pregabalin (LYRICA) 75 MG capsule Take 1 capsule (75 mg) po as needed for pain - start with bid prn for one week then can increase to tid prn if needed . 90 capsule 0  . QUEtiapine (SEROQUEL) 200 MG tablet TAKE 1 TABLET(200 MG) BY MOUTH AT BEDTIME 90 tablet 0  . sertraline (ZOLOFT) 100 MG tablet Take 2 tablets (200 mg total) by mouth daily. 120 tablet 3  . varenicline (CHANTIX) 1 MG tablet TAKE 1 TABLET BY MOUTH TWICE DAILY( EVERY TWELVE HOURS) 56 tablet 3  . nitroGLYCERIN (NITROSTAT) 0.4 MG SL tablet Place 1 tablet (0.4 mg total) under the tongue every 5 (five) minutes as needed for chest pain. 25 tablet 11  . rosuvastatin (CRESTOR) 40 MG tablet Take 1 tablet (40 mg total) by mouth daily. 90 tablet 3   No current facility-administered medications for this visit.    Allergies  Allergen Reactions  . Bee Venom Anaphylaxis  . Iodinated Diagnostic Agents Other (See Comments)    Per patient cardiac arrest IVP DYE-CARDIAC ARREST  . Penicillins Anaphylaxis    Has patient had a PCN reaction causing immediate rash, facial/tongue/throat swelling, SOB or lightheadedness with hypotension: yes Has patient had a PCN reaction causing severe rash involving mucus membranes or skin  necrosis: no Has patient had a PCN reaction that required hospitalization: yes Has patient had a PCN  reaction occurring within the last 10 years: no If all of the above answers are "NO", then may proceed with Cephalosporin use.   . Povidone Iodine Itching and Rash  . Tomato Itching  . Chocolate Rash  . Latex Rash  . Other Rash  . Atorvastatin Calcium Nausea Only  . Esomeprazole Magnesium Nausea Only  . Gluten Meal   . Metformin And Related Diarrhea  . Quetiapine Fumarate     Per pt she can not take XR  . Trazodone And Nefazodone Hives  . Zolpidem Tartrate Nausea And Vomiting   Addendum to correct incorrect date due to templating error

## 2018-09-10 NOTE — Telephone Encounter (Signed)
-----   Message from Gregor Hams, MD sent at 09/10/2018 10:47 AM EDT ----- Regarding: Recheck 2 weeks Please call and schedule 2 weeks recheck over the phone for hip pain

## 2018-09-11 ENCOUNTER — Encounter: Payer: Self-pay | Admitting: Family Medicine

## 2018-09-11 DIAGNOSIS — I10 Essential (primary) hypertension: Secondary | ICD-10-CM | POA: Diagnosis not present

## 2018-09-11 DIAGNOSIS — E039 Hypothyroidism, unspecified: Secondary | ICD-10-CM | POA: Diagnosis not present

## 2018-09-11 DIAGNOSIS — M25552 Pain in left hip: Secondary | ICD-10-CM | POA: Diagnosis not present

## 2018-09-11 DIAGNOSIS — M549 Dorsalgia, unspecified: Secondary | ICD-10-CM | POA: Diagnosis not present

## 2018-09-11 DIAGNOSIS — J449 Chronic obstructive pulmonary disease, unspecified: Secondary | ICD-10-CM | POA: Diagnosis not present

## 2018-09-11 DIAGNOSIS — Z7982 Long term (current) use of aspirin: Secondary | ICD-10-CM | POA: Diagnosis not present

## 2018-09-11 DIAGNOSIS — E785 Hyperlipidemia, unspecified: Secondary | ICD-10-CM | POA: Diagnosis not present

## 2018-09-11 DIAGNOSIS — E119 Type 2 diabetes mellitus without complications: Secondary | ICD-10-CM | POA: Diagnosis not present

## 2018-09-11 DIAGNOSIS — G4730 Sleep apnea, unspecified: Secondary | ICD-10-CM | POA: Diagnosis not present

## 2018-09-11 NOTE — Progress Notes (Signed)
Received documentation from home health. Vital signs alert. The reason for alert was pain scale 8/10. Vital signs abstracted otherwise normal. Vitals:   09/05/18 1053  BP: 125/74  Pulse: 96  Resp: 16  Temp: 98.3 F (36.8 C)   We will make PCP aware of findings.  Based on recent conversation with patient her pain is improving with physical therapy.

## 2018-09-12 DIAGNOSIS — E785 Hyperlipidemia, unspecified: Secondary | ICD-10-CM | POA: Diagnosis not present

## 2018-09-12 DIAGNOSIS — J449 Chronic obstructive pulmonary disease, unspecified: Secondary | ICD-10-CM | POA: Diagnosis not present

## 2018-09-12 DIAGNOSIS — E039 Hypothyroidism, unspecified: Secondary | ICD-10-CM | POA: Diagnosis not present

## 2018-09-12 DIAGNOSIS — G4730 Sleep apnea, unspecified: Secondary | ICD-10-CM | POA: Diagnosis not present

## 2018-09-12 DIAGNOSIS — Z7982 Long term (current) use of aspirin: Secondary | ICD-10-CM | POA: Diagnosis not present

## 2018-09-12 DIAGNOSIS — I10 Essential (primary) hypertension: Secondary | ICD-10-CM | POA: Diagnosis not present

## 2018-09-12 DIAGNOSIS — M25552 Pain in left hip: Secondary | ICD-10-CM | POA: Diagnosis not present

## 2018-09-12 DIAGNOSIS — E119 Type 2 diabetes mellitus without complications: Secondary | ICD-10-CM | POA: Diagnosis not present

## 2018-09-12 DIAGNOSIS — M549 Dorsalgia, unspecified: Secondary | ICD-10-CM | POA: Diagnosis not present

## 2018-09-13 DIAGNOSIS — I10 Essential (primary) hypertension: Secondary | ICD-10-CM | POA: Diagnosis not present

## 2018-09-13 DIAGNOSIS — E039 Hypothyroidism, unspecified: Secondary | ICD-10-CM | POA: Diagnosis not present

## 2018-09-13 DIAGNOSIS — M549 Dorsalgia, unspecified: Secondary | ICD-10-CM | POA: Diagnosis not present

## 2018-09-13 DIAGNOSIS — G4730 Sleep apnea, unspecified: Secondary | ICD-10-CM | POA: Diagnosis not present

## 2018-09-13 DIAGNOSIS — E119 Type 2 diabetes mellitus without complications: Secondary | ICD-10-CM | POA: Diagnosis not present

## 2018-09-13 DIAGNOSIS — J449 Chronic obstructive pulmonary disease, unspecified: Secondary | ICD-10-CM | POA: Diagnosis not present

## 2018-09-13 DIAGNOSIS — M25552 Pain in left hip: Secondary | ICD-10-CM | POA: Diagnosis not present

## 2018-09-13 DIAGNOSIS — Z7982 Long term (current) use of aspirin: Secondary | ICD-10-CM | POA: Diagnosis not present

## 2018-09-13 DIAGNOSIS — E785 Hyperlipidemia, unspecified: Secondary | ICD-10-CM | POA: Diagnosis not present

## 2018-09-17 ENCOUNTER — Ambulatory Visit: Payer: Self-pay | Admitting: Family Medicine

## 2018-09-17 DIAGNOSIS — M25552 Pain in left hip: Secondary | ICD-10-CM | POA: Diagnosis not present

## 2018-09-17 DIAGNOSIS — G4730 Sleep apnea, unspecified: Secondary | ICD-10-CM | POA: Diagnosis not present

## 2018-09-17 DIAGNOSIS — M549 Dorsalgia, unspecified: Secondary | ICD-10-CM | POA: Diagnosis not present

## 2018-09-17 DIAGNOSIS — E039 Hypothyroidism, unspecified: Secondary | ICD-10-CM | POA: Diagnosis not present

## 2018-09-17 DIAGNOSIS — I10 Essential (primary) hypertension: Secondary | ICD-10-CM | POA: Diagnosis not present

## 2018-09-17 DIAGNOSIS — J449 Chronic obstructive pulmonary disease, unspecified: Secondary | ICD-10-CM | POA: Diagnosis not present

## 2018-09-17 DIAGNOSIS — E785 Hyperlipidemia, unspecified: Secondary | ICD-10-CM | POA: Diagnosis not present

## 2018-09-17 DIAGNOSIS — E119 Type 2 diabetes mellitus without complications: Secondary | ICD-10-CM | POA: Diagnosis not present

## 2018-09-17 DIAGNOSIS — Z7982 Long term (current) use of aspirin: Secondary | ICD-10-CM | POA: Diagnosis not present

## 2018-09-19 DIAGNOSIS — M25552 Pain in left hip: Secondary | ICD-10-CM | POA: Diagnosis not present

## 2018-09-19 DIAGNOSIS — Z7982 Long term (current) use of aspirin: Secondary | ICD-10-CM | POA: Diagnosis not present

## 2018-09-19 DIAGNOSIS — M549 Dorsalgia, unspecified: Secondary | ICD-10-CM | POA: Diagnosis not present

## 2018-09-19 DIAGNOSIS — G4730 Sleep apnea, unspecified: Secondary | ICD-10-CM | POA: Diagnosis not present

## 2018-09-19 DIAGNOSIS — E039 Hypothyroidism, unspecified: Secondary | ICD-10-CM | POA: Diagnosis not present

## 2018-09-19 DIAGNOSIS — E119 Type 2 diabetes mellitus without complications: Secondary | ICD-10-CM | POA: Diagnosis not present

## 2018-09-19 DIAGNOSIS — I10 Essential (primary) hypertension: Secondary | ICD-10-CM | POA: Diagnosis not present

## 2018-09-19 DIAGNOSIS — E785 Hyperlipidemia, unspecified: Secondary | ICD-10-CM | POA: Diagnosis not present

## 2018-09-19 DIAGNOSIS — J449 Chronic obstructive pulmonary disease, unspecified: Secondary | ICD-10-CM | POA: Diagnosis not present

## 2018-09-21 DIAGNOSIS — J449 Chronic obstructive pulmonary disease, unspecified: Secondary | ICD-10-CM | POA: Diagnosis not present

## 2018-09-21 DIAGNOSIS — E119 Type 2 diabetes mellitus without complications: Secondary | ICD-10-CM | POA: Diagnosis not present

## 2018-09-21 DIAGNOSIS — M25552 Pain in left hip: Secondary | ICD-10-CM | POA: Diagnosis not present

## 2018-09-21 DIAGNOSIS — E785 Hyperlipidemia, unspecified: Secondary | ICD-10-CM | POA: Diagnosis not present

## 2018-09-21 DIAGNOSIS — E039 Hypothyroidism, unspecified: Secondary | ICD-10-CM | POA: Diagnosis not present

## 2018-09-21 DIAGNOSIS — I10 Essential (primary) hypertension: Secondary | ICD-10-CM | POA: Diagnosis not present

## 2018-09-21 DIAGNOSIS — Z7982 Long term (current) use of aspirin: Secondary | ICD-10-CM | POA: Diagnosis not present

## 2018-09-21 DIAGNOSIS — M549 Dorsalgia, unspecified: Secondary | ICD-10-CM | POA: Diagnosis not present

## 2018-09-21 DIAGNOSIS — G4730 Sleep apnea, unspecified: Secondary | ICD-10-CM | POA: Diagnosis not present

## 2018-09-24 ENCOUNTER — Ambulatory Visit (INDEPENDENT_AMBULATORY_CARE_PROVIDER_SITE_OTHER): Payer: Medicare HMO | Admitting: Family Medicine

## 2018-09-24 ENCOUNTER — Encounter: Payer: Self-pay | Admitting: Family Medicine

## 2018-09-24 VITALS — Ht 63.0 in | Wt 204.0 lb

## 2018-09-24 DIAGNOSIS — M25552 Pain in left hip: Secondary | ICD-10-CM

## 2018-09-24 MED ORDER — LORAZEPAM 0.5 MG PO TABS: ORAL_TABLET | ORAL | 0 refills | Status: DC

## 2018-09-24 MED ORDER — HYDROCODONE-ACETAMINOPHEN 5-325 MG PO TABS: 1.0000 | ORAL_TABLET | Freq: Four times a day (QID) | ORAL | 0 refills | Status: DC | PRN

## 2018-09-24 NOTE — Progress Notes (Signed)
Virtual Visit  I connected with      Autumn Bowen  by a telemedicine application and verified that I am speaking with the correct person using two identifiers.   I discussed the limitations of evaluation and management by telemedicine and the availability of in person appointments. The patient expressed understanding and agreed to proceed.  History of Present Illness: Autumn Bowen is a 73 y.o. female who would like to discuss hip pain.  Autumn Bowen is receiving home health occupational and physical therapy. She is using a walking and learning how to move around her apartment safely.  She finds occupational therapy helpful but is struggling with physical therapy. She notes quite a bit of pain with PT exercises despite NORCO even before PT exercises. She has been working with the PT supervisor and has modified her exercises.  She notes she still has quite a bit of pain and is very uncomfortable.     Observations/Objective: Ht 5' 3"  (1.6 m)   Wt 204 lb (92.5 kg)   LMP 09/21/1974   BMI 36.14 kg/m  Wt Readings from Last 5 Encounters:  09/24/18 204 lb (92.5 kg)  09/03/18 204 lb (92.5 kg)  08/24/18 206 lb (93.4 kg)  08/15/18 206 lb (93.4 kg)  08/01/18 208 lb (94.3 kg)   Exam: Normal Speech.    Lab and Radiology Results No results found for this or any previous visit (from the past 72 hour(s)). No results found.   Assessment and Plan: 73 y.o. female with left hip pain likely due to trochanteric bursitis with only moderate to mild improvement with dedicated course of physical therapy.  Patient has had trials of injection and other conservative management clinic physical therapy and not improved much.  At this point we will proceed with MRI of hip.  X-ray obtained in March which did not show a clear cause of her hip pain.  Will recheck after MRI.  Limited hydrocodone for pain control and Ativan for claustrophobia during MRI.  Cautioned against mixing Ativan and hydrocodone at same  time.  PDMP reviewed during this encounter. Orders Placed This Encounter  Procedures  . MR HIP LEFT WO CONTRAST    Standing Status:   Future    Standing Expiration Date:   11/24/2019    Order Specific Question:   What is the patient's sedation requirement?    Answer:   Anti-anxiety    Order Specific Question:   Does the patient have a pacemaker or implanted devices?    Answer:   No    Order Specific Question:   Preferred imaging location?    Answer:   [facility] (table limit-350lbs)    Order Specific Question:   Radiology Contrast Protocol - do NOT remove file path    Answer:   [email or web address]   Meds ordered this encounter  Medications  . LORazepam (ATIVAN) 0.5 MG tablet    Sig: 1-2 tabs 30 - 60 min prior to MRI. Do not drive with this medicine.    Dispense:  4 tablet    Refill:  0  . HYDROcodone-acetaminophen (NORCO/VICODIN) 5-325 MG tablet    Sig: Take 1 tablet by mouth every 6 (six) hours as needed.    Dispense:  15 tablet    Refill:  0    Follow Up Instructions:    I discussed the assessment and treatment plan with the patient. The patient was provided an opportunity to ask questions and all were answered. The patient agreed with the  plan and demonstrated an understanding of the instructions.   The patient was advised to call back or seek an in-person evaluation if the symptoms worsen or if the condition fails to improve as anticipated.  Time: 25 minutes of intraservice time, with >39 minutes of total time during today's visit.      Historical information moved to improve visibility of documentation.  Past Medical History:  Diagnosis Date  . Allergy    Notes that grass and some trees causes eyes to sting, burn, and water.  . Anemia   . Celiac disease   . Celiac disease   . COPD (chronic obstructive pulmonary disease) ([place])   . Diabetes mellitus type II   . Diverticulitis   . Dyslipidemia   . HOH (hard of hearing)   .  Hypertension   . Hypothyroidism   . Sleep apnea   . Sore throat    Past Surgical History:  Procedure Laterality Date  . APPENDECTOMY    . BREAST SURGERY     h/o benign cystleft breast and lymp node removal on right breast area.  Marland Kitchen EYE SURGERY     Left eye cataract removal, right eye h/o macular degeneration.  . INTRAVASCULAR PRESSURE WIRE/FFR STUDY N/A 12/07/2017   Procedure: INTRAVASCULAR PRESSURE WIRE/FFR STUDY;  Surgeon: Leonie Man, MD;  Location: [facility];  Service: Cardiovascular;  Laterality: N/A;  . LEFT HEART CATH AND CORONARY ANGIOGRAPHY N/A 12/07/2017   Procedure: LEFT HEART CATH AND CORONARY ANGIOGRAPHY;  Surgeon: Leonie Man, MD;  Location: [facility];  Service: Cardiovascular;  Laterality: N/A;  . SPINE SURGERY     Pt not sure of the type of surgery she had but knows it was L4-5.  Marland Kitchen VAGINAL HYSTERECTOMY     Social History   Tobacco Use  . Smoking status: Current Some Day Smoker    Packs/day: 0.25    Years: 51.00    Pack years: 12.75    Types: Cigarettes    Start date: 01/02/2012  . Smokeless tobacco: Never Used  . Tobacco comment: Been down to 4 - 6 cigs a day  Substance Use Topics  . Alcohol use: No   family history includes Alcohol abuse in her brother; Anxiety disorder in her mother; Asthma in her son; COPD in her father and sister; Cancer in her sister; Depression in her mother; Diabetes in her father and son; Diabetes type II in her mother; Drug abuse in her brother; Glaucoma in her father; Hashimoto's thyroiditis in her sister; Heart attack in her mother; Hypertension in her father; Kidney disease in her sister; Stroke in her mother.  Medications: Current Outpatient Medications  Medication Sig Dispense Refill  . ACCU-CHEK AVIVA PLUS test strip TEST 1-2 TIMES DAILY 100 each 1  . acetaminophen (TYLENOL) 500 MG tablet Take 1,000 mg by mouth every 6 (six) hours as needed for mild pain or moderate pain.    Marland Kitchen albuterol (PROVENTIL  HFA;VENTOLIN HFA) 108 (90 Base) MCG/ACT inhaler Inhale 2 puffs into the lungs every 4 (four) hours as needed for wheezing. 2 Inhaler 11  . AMBULATORY NON FORMULARY MEDICATION Glucometer, test strips and lancets for testing once a day for DM, uncontrolled. 100 strip 0  . AMBULATORY NON FORMULARY MEDICATION Knee-high, medium compression, graduated compression stockings. Apply to lower extremities. Disp per patient preference and insurance coverage. 10 each prn  . AMBULATORY NON FORMULARY MEDICATION Standard walker use as needed. R26.2 M54.9 Disp 1 1 Units prn  . amitriptyline (  ELAVIL) 50 MG tablet TAKE 1 TABLET BY MOUTH EVERY NIGHT AT BEDTIME 90 tablet 3  . aspirin EC 81 MG tablet Take 1 tablet (81 mg total) by mouth daily. 90 tablet 3  . BD PEN NEEDLE NANO U/F 32G X 4 MM MISC USE AS DIRECTED 100 each 3  . cholecalciferol (VITAMIN D) 1000 units tablet Take 1,000 Units by mouth daily.    . colchicine 0.6 MG tablet Take 1 tablet (0.6 mg total) by mouth daily. 90 tablet 3  . diazepam (VALIUM) 5 MG tablet Take 1 tab PO 1 hour before procedure or imaging. 5 tablet 0  . EPINEPHrine (EPIPEN 2-PAK) 0.3 mg/0.3 mL IJ SOAJ injection Inject 0.3 mLs (0.3 mg total) into the muscle as needed for anaphylaxis. 1 Device prn  . famotidine (PEPCID) 20 MG tablet TAKE 1 TABLET(20 MG TOTAL) BY MOUTH DAILY. 90 tablet 3  . gabapentin (NEURONTIN) 300 MG capsule One tab PO qHS for a week, then BID for a week, then TID. May double weekly to a max of 3,654m/day 180 capsule 3  . GLUCOSAMINE-CHONDROITIN PO Take 1 tablet by mouth 2 (two) times daily.     .Marland KitchenHYDROcodone-acetaminophen (NORCO/VICODIN) 5-325 MG tablet Take 1 tablet by mouth every 6 (six) hours as needed. 15 tablet 0  . Insulin Degludec (TRESIBA FLEXTOUCH) 200 UNIT/ML SOPN Inject 28 Units into the skin at bedtime. Increase by 2 units every 5 days until fasting glucose 90-120.    .Marland Kitchenisosorbide mononitrate (IMDUR) 30 MG 24 hr tablet Take 1 tablet (30 mg total) by mouth  daily. 90 tablet 3  . JARDIANCE 10 MG TABS tablet TAKE 1 TABLET BY MOUTH DAILY 90 tablet 1  . levocetirizine (XYZAL) 5 MG tablet Take 5 mg by mouth every evening.    .Marland Kitchenlevothyroxine (SYNTHROID, LEVOTHROID) 150 MCG tablet TAKE 1 TABLET BY MOUTH DAILY BEFORE BREAKFAST 90 tablet 3  . losartan (COZAAR) 25 MG tablet TAKE 1 TABLET(25 MG) BY MOUTH DAILY 30 tablet 5  . LUMIGAN 0.01 % SOLN Instill 1 drop into both eyes every night  3  . metFORMIN (GLUCOPHAGE-XR) 750 MG 24 hr tablet TAKE 1 TABLET(750 MG TOTAL) BY MOUTH DAILY WITH BREAKFAST. 90 tablet 1  . metFORMIN (GLUCOPHAGE-XR) 750 MG 24 hr tablet TAKE 1 TABLET(750 MG TOTAL) BY MOUTH DAILY WITH BREAKFAST. 90 tablet 3  . Multiple Vitamins-Minerals (PRESERVISION AREDS 2 PO) Take 1 capsule by mouth 2 (two) times daily.    . ondansetron (ZOFRAN) 8 MG tablet TAKE 1 TABLET BY MOUTH EVERY 8 HOURS AS NEEDED FOR NAUSEA AND VOMITING 20 tablet 2  . pregabalin (LYRICA) 75 MG capsule Take 1 capsule (75 mg) po as needed for pain - start with bid prn for one week then can increase to tid prn if needed . 90 capsule 0  . QUEtiapine (SEROQUEL) 200 MG tablet TAKE 1 TABLET(200 MG) BY MOUTH AT BEDTIME 90 tablet 0  . sertraline (ZOLOFT) 100 MG tablet Take 2 tablets (200 mg total) by mouth daily. 120 tablet 3  . diclofenac sodium (VOLTAREN) 1 % GEL Apply 4 g topically 4 (four) times daily. To affected joint. (Patient not taking: Reported on 09/24/2018) 100 g 11  . dicyclomine (BENTYL) 10 MG capsule TAKE ONE CAPSULE BY MOUTH TWICE DAILY (Patient not taking: Reported on 09/24/2018) 180 capsule 0  . fluconazole (DIFLUCAN) 150 MG tablet     . Fluticasone-Salmeterol (ADVAIR) 250-50 MCG/DOSE AEPB Inhale 1 puff into the lungs every 12 (twelve) hours. (Patient not taking:  Reported on 09/24/2018) 60 each 11  . LORazepam (ATIVAN) 0.5 MG tablet 1-2 tabs 30 - 60 min prior to MRI. Do not drive with this medicine. 4 tablet 0  . nitrofurantoin, macrocrystal-monohydrate, (MACROBID) 100 MG capsule  Take 1 capsule (100 mg total) by mouth 2 (two) times daily. (Patient not taking: Reported on 09/24/2018) 10 capsule 0  . nitroGLYCERIN (NITROSTAT) 0.4 MG SL tablet Place 1 tablet (0.4 mg total) under the tongue every 5 (five) minutes as needed for chest pain. 25 tablet 11  . rosuvastatin (CRESTOR) 40 MG tablet Take 1 tablet (40 mg total) by mouth daily. 90 tablet 3  . varenicline (CHANTIX) 1 MG tablet TAKE 1 TABLET BY MOUTH TWICE DAILY( EVERY TWELVE HOURS) (Patient not taking: Reported on 09/24/2018) 56 tablet 3   No current facility-administered medications for this visit.    Allergies  Allergen Reactions  . Bee Venom Anaphylaxis  . Iodinated Diagnostic Agents Other (See Comments)    Per patient cardiac arrest IVP DYE-CARDIAC ARREST  . Penicillins Anaphylaxis    Has patient had a PCN reaction causing immediate rash, facial/tongue/throat swelling, SOB or lightheadedness with hypotension: yes Has patient had a PCN reaction causing severe rash involving mucus membranes or skin necrosis: no Has patient had a PCN reaction that required hospitalization: yes Has patient had a PCN reaction occurring within the last 10 years: no If all of the above answers are "NO", then may proceed with Cephalosporin use.   . Povidone Iodine Itching and Rash  . Tomato Itching  . Chocolate Rash  . Latex Rash  . Other Rash  . Atorvastatin Calcium Nausea Only  . Esomeprazole Magnesium Nausea Only  . Gluten Meal   . Metformin And Related Diarrhea  . Quetiapine Fumarate     Per pt she can not take XR  . Trazodone And Nefazodone Hives  . Zolpidem Tartrate Nausea And Vomiting

## 2018-09-24 NOTE — Patient Instructions (Signed)
Thank you for coming in today. Use limited norco for pain.  You should hear about MRI.  We will discuss the MRI findings and talk about next step.  Ok to take lorazepam prior to MRI for claustrophobia.  Do not take with hydrocodone.  You should hear about MRI scheduling soon. Let me know if you do not hear anything within 1 week.

## 2018-09-27 ENCOUNTER — Other Ambulatory Visit: Payer: Self-pay | Admitting: Osteopathic Medicine

## 2018-09-27 ENCOUNTER — Encounter: Payer: Self-pay | Admitting: Family Medicine

## 2018-09-27 DIAGNOSIS — G629 Polyneuropathy, unspecified: Secondary | ICD-10-CM

## 2018-09-27 NOTE — Telephone Encounter (Signed)
[facility] requesting med refill for pregabalin.

## 2018-09-27 NOTE — Progress Notes (Signed)
Received fax from [facility].  They had been providing home health physical therapy services.  She was discharged from home health physical therapy because goals have been met which were to improve mobility.  We will send a copy of the discharge summary to scan.

## 2018-09-30 ENCOUNTER — Other Ambulatory Visit: Payer: Self-pay

## 2018-09-30 ENCOUNTER — Ambulatory Visit (INDEPENDENT_AMBULATORY_CARE_PROVIDER_SITE_OTHER): Payer: Medicare HMO

## 2018-09-30 DIAGNOSIS — S76312A Strain of muscle, fascia and tendon of the posterior muscle group at thigh level, left thigh, initial encounter: Secondary | ICD-10-CM | POA: Diagnosis not present

## 2018-09-30 DIAGNOSIS — M25552 Pain in left hip: Secondary | ICD-10-CM

## 2018-10-03 ENCOUNTER — Encounter: Payer: Self-pay | Admitting: Family Medicine

## 2018-10-03 ENCOUNTER — Ambulatory Visit (INDEPENDENT_AMBULATORY_CARE_PROVIDER_SITE_OTHER): Payer: Medicare HMO | Admitting: Family Medicine

## 2018-10-03 VITALS — Ht 63.0 in | Wt 204.0 lb

## 2018-10-03 DIAGNOSIS — S76019A Strain of muscle, fascia and tendon of unspecified hip, initial encounter: Secondary | ICD-10-CM

## 2018-10-03 NOTE — Progress Notes (Signed)
Virtual Visit  I connected with      Autumn Bowen  by a telemedicine application and verified that I am speaking with the correct person using two identifiers.   I discussed the limitations of evaluation and management by telemedicine and the availability of in person appointments. The patient expressed understanding and agreed to proceed.  History of Present Illness: Autumn Bowen is a 73 y.o. female who would like to discuss follow-up hip pain.  Autumn Bowen has been seen previously for left hip pain.  She originally had a fall and had extensive trials of an guided injection at the greater trochanter as well as physical therapy via home health.  She notes that she continues to have significant pain and difficulty walking.  She has been using hydrocodone for pain control which does help some but she is pretty miserable.  In response to this she had a hip MRI on May 3 which showed a partial-thickness tear of the gluteus medius tendon at the insertion onto the greater trochanter.   Observations/Objective: Ht 5' 3"  (1.6 m)   Wt 204 lb (92.5 kg)   LMP 09/21/1974   BMI 36.14 kg/m  Wt Readings from Last 5 Encounters:  10/03/18 204 lb (92.5 kg)  09/24/18 204 lb (92.5 kg)  09/03/18 204 lb (92.5 kg)  08/24/18 206 lb (93.4 kg)  08/15/18 206 lb (93.4 kg)   Exam: Normal Speech.    Lab and Radiology Results No results found for this or any previous visit (from the past 72 hour(s)). Mr Hip Left Wo Contrast  Result Date: 09/30/2018 CLINICAL DATA:  Left hip pain since a fall at home on March 2020. EXAM: MR OF THE LEFT HIP WITHOUT CONTRAST TECHNIQUE: Multiplanar, multisequence MR imaging was performed. No intravenous contrast was administered. COMPARISON:  None. FINDINGS: Bones: No hip fracture, dislocation or avascular necrosis. No periosteal reaction or bone destruction. No aggressive osseous lesion. Normal sacrum and sacroiliac joints. No SI joint widening or erosive changes. Degenerative disease  with disc height loss at L5-S1 with bilateral facet arthropathy. Articular cartilage and labrum Articular cartilage:  No chondral defect. Labrum: Limited evaluation secondary to lack of intra-articular contrast. Left superior anterior labral degeneration. Joint or bursal effusion Joint effusion:  No hip joint effusion.  No SI joint effusion. Bursae:  No bursa formation. Muscles and tendons Flexors: Normal. Extensors: Normal. Abductors: Normal. Adductors: Normal. Gluteals: Partial-thickness tear of the left gluteus medius tendon insertion. Hamstrings: Normal. Other findings Miscellaneous: No pelvic free fluid. No fluid collection or hematoma. No inguinal lymphadenopathy. No inguinal hernia. IMPRESSION: 1. No hip fracture, dislocation or avascular necrosis. 2. Partial-thickness tear of the left gluteus medius tendon insertion. Electronically Signed   By: Kathreen Devoid   On: 09/30/2018 19:09   I personally (independently) visualized and performed the interpretation of the images attached in this note.    Assessment and Plan: 73 y.o. female with left hip pain due to partial thickness tear of the medius medius tendon and greater trochanter insertion.  After lengthy discussion regarding MRI findings and treatment options will schedule for ultrasound-guided and injection of the tear in clinic tomorrow.    Follow Up Instructions:    I discussed the assessment and treatment plan with the patient. The patient was provided an opportunity to ask questions and all were answered. The patient agreed with the plan and demonstrated an understanding of the instructions.   The patient was advised to call back or seek an in-person evaluation if  the symptoms worsen or if the condition fails to improve as anticipated.  Time: 15 minutes of intraservice time, with >22 minutes of total time during today's visit.      Historical information moved to improve visibility of documentation.  Past Medical History:   Diagnosis Date  . Allergy    Notes that grass and some trees causes eyes to sting, burn, and water.  . Anemia   . Celiac disease   . Celiac disease   . COPD (chronic obstructive pulmonary disease) ([place])   . Diabetes mellitus type II   . Diverticulitis   . Dyslipidemia   . HOH (hard of hearing)   . Hypertension   . Hypothyroidism   . Sleep apnea   . Sore throat    Past Surgical History:  Procedure Laterality Date  . APPENDECTOMY    . BREAST SURGERY     h/o benign cystleft breast and lymp node removal on right breast area.  Marland Kitchen EYE SURGERY     Left eye cataract removal, right eye h/o macular degeneration.  . INTRAVASCULAR PRESSURE WIRE/FFR STUDY N/A 12/07/2017   Procedure: INTRAVASCULAR PRESSURE WIRE/FFR STUDY;  Surgeon: Leonie Man, MD;  Location: [facility];  Service: Cardiovascular;  Laterality: N/A;  . LEFT HEART CATH AND CORONARY ANGIOGRAPHY N/A 12/07/2017   Procedure: LEFT HEART CATH AND CORONARY ANGIOGRAPHY;  Surgeon: Leonie Man, MD;  Location: [facility];  Service: Cardiovascular;  Laterality: N/A;  . SPINE SURGERY     Pt not sure of the type of surgery she had but knows it was L4-5.  Marland Kitchen VAGINAL HYSTERECTOMY     Social History   Tobacco Use  . Smoking status: Current Some Day Smoker    Packs/day: 0.25    Years: 51.00    Pack years: 12.75    Types: Cigarettes    Start date: 01/02/2012  . Smokeless tobacco: Never Used  . Tobacco comment: Been down to 4 - 6 cigs a day  Substance Use Topics  . Alcohol use: No   family history includes Alcohol abuse in her brother; Anxiety disorder in her mother; Asthma in her son; COPD in her father and sister; Cancer in her sister; Depression in her mother; Diabetes in her father and son; Diabetes type II in her mother; Drug abuse in her brother; Glaucoma in her father; Hashimoto's thyroiditis in her sister; Heart attack in her mother; Hypertension in her father; Kidney disease in her sister; Stroke in her  mother.  Medications: Current Outpatient Medications  Medication Sig Dispense Refill  . ACCU-CHEK AVIVA PLUS test strip TEST 1-2 TIMES DAILY 100 each 1  . acetaminophen (TYLENOL) 500 MG tablet Take 1,000 mg by mouth every 6 (six) hours as needed for mild pain or moderate pain.    Marland Kitchen albuterol (PROVENTIL HFA;VENTOLIN HFA) 108 (90 Base) MCG/ACT inhaler Inhale 2 puffs into the lungs every 4 (four) hours as needed for wheezing. 2 Inhaler 11  . AMBULATORY NON FORMULARY MEDICATION Glucometer, test strips and lancets for testing once a day for DM, uncontrolled. 100 strip 0  . AMBULATORY NON FORMULARY MEDICATION Knee-high, medium compression, graduated compression stockings. Apply to lower extremities. Disp per patient preference and insurance coverage. 10 each prn  . AMBULATORY NON FORMULARY MEDICATION Standard walker use as needed. R26.2 M54.9 Disp 1 1 Units prn  . amitriptyline (ELAVIL) 50 MG tablet TAKE 1 TABLET BY MOUTH EVERY NIGHT AT BEDTIME 90 tablet 3  . aspirin EC 81 MG tablet  Take 1 tablet (81 mg total) by mouth daily. 90 tablet 3  . BD PEN NEEDLE NANO U/F 32G X 4 MM MISC USE AS DIRECTED 100 each 3  . cholecalciferol (VITAMIN D) 1000 units tablet Take 1,000 Units by mouth daily.    . colchicine 0.6 MG tablet Take 1 tablet (0.6 mg total) by mouth daily. 90 tablet 3  . diazepam (VALIUM) 5 MG tablet Take 1 tab PO 1 hour before procedure or imaging. 5 tablet 0  . diclofenac sodium (VOLTAREN) 1 % GEL Apply 4 g topically 4 (four) times daily. To affected joint. 100 g 11  . dicyclomine (BENTYL) 10 MG capsule TAKE ONE CAPSULE BY MOUTH TWICE DAILY 180 capsule 0  . EPINEPHrine (EPIPEN 2-PAK) 0.3 mg/0.3 mL IJ SOAJ injection Inject 0.3 mLs (0.3 mg total) into the muscle as needed for anaphylaxis. 1 Device prn  . famotidine (PEPCID) 20 MG tablet TAKE 1 TABLET(20 MG TOTAL) BY MOUTH DAILY. 90 tablet 3  . fluconazole (DIFLUCAN) 150 MG tablet     . Fluticasone-Salmeterol (ADVAIR) 250-50 MCG/DOSE AEPB Inhale  1 puff into the lungs every 12 (twelve) hours. 60 each 11  . gabapentin (NEURONTIN) 300 MG capsule One tab PO qHS for a week, then BID for a week, then TID. May double weekly to a max of 3,642m/day 180 capsule 3  . GLUCOSAMINE-CHONDROITIN PO Take 1 tablet by mouth 2 (two) times daily.     .Marland KitchenHYDROcodone-acetaminophen (NORCO/VICODIN) 5-325 MG tablet Take 1 tablet by mouth every 6 (six) hours as needed. 15 tablet 0  . Insulin Degludec (TRESIBA FLEXTOUCH) 200 UNIT/ML SOPN Inject 28 Units into the skin at bedtime. Increase by 2 units every 5 days until fasting glucose 90-120.    .Marland Kitchenisosorbide mononitrate (IMDUR) 30 MG 24 hr tablet Take 1 tablet (30 mg total) by mouth daily. 90 tablet 3  . JARDIANCE 10 MG TABS tablet TAKE 1 TABLET BY MOUTH DAILY 90 tablet 1  . levocetirizine (XYZAL) 5 MG tablet Take 5 mg by mouth every evening.    .Marland Kitchenlevothyroxine (SYNTHROID, LEVOTHROID) 150 MCG tablet TAKE 1 TABLET BY MOUTH DAILY BEFORE BREAKFAST 90 tablet 3  . LORazepam (ATIVAN) 0.5 MG tablet 1-2 tabs 30 - 60 min prior to MRI. Do not drive with this medicine. 4 tablet 0  . losartan (COZAAR) 25 MG tablet TAKE 1 TABLET(25 MG) BY MOUTH DAILY 30 tablet 5  . LUMIGAN 0.01 % SOLN Instill 1 drop into both eyes every night  3  . metFORMIN (GLUCOPHAGE-XR) 750 MG 24 hr tablet TAKE 1 TABLET(750 MG TOTAL) BY MOUTH DAILY WITH BREAKFAST. 90 tablet 1  . metFORMIN (GLUCOPHAGE-XR) 750 MG 24 hr tablet TAKE 1 TABLET(750 MG TOTAL) BY MOUTH DAILY WITH BREAKFAST. 90 tablet 3  . Multiple Vitamins-Minerals (PRESERVISION AREDS 2 PO) Take 1 capsule by mouth 2 (two) times daily.    . nitrofurantoin, macrocrystal-monohydrate, (MACROBID) 100 MG capsule Take 1 capsule (100 mg total) by mouth 2 (two) times daily. 10 capsule 0  . ondansetron (ZOFRAN) 8 MG tablet TAKE 1 TABLET BY MOUTH EVERY 8 HOURS AS NEEDED FOR NAUSEA AND VOMITING 20 tablet 2  . pregabalin (LYRICA) 75 MG capsule Take 1 capsule (75 mg total) by mouth 3 (three) times daily. 90 capsule 1   . QUEtiapine (SEROQUEL) 200 MG tablet TAKE 1 TABLET(200 MG) BY MOUTH AT BEDTIME 90 tablet 0  . sertraline (ZOLOFT) 100 MG tablet Take 2 tablets (200 mg total) by mouth daily. 120 tablet 3  .  varenicline (CHANTIX) 1 MG tablet TAKE 1 TABLET BY MOUTH TWICE DAILY( EVERY TWELVE HOURS) 56 tablet 3  . nitroGLYCERIN (NITROSTAT) 0.4 MG SL tablet Place 1 tablet (0.4 mg total) under the tongue every 5 (five) minutes as needed for chest pain. 25 tablet 11  . rosuvastatin (CRESTOR) 40 MG tablet Take 1 tablet (40 mg total) by mouth daily. 90 tablet 3   No current facility-administered medications for this visit.    Allergies  Allergen Reactions  . Bee Venom Anaphylaxis  . Iodinated Diagnostic Agents Other (See Comments)    Per patient cardiac arrest IVP DYE-CARDIAC ARREST  . Penicillins Anaphylaxis    Has patient had a PCN reaction causing immediate rash, facial/tongue/throat swelling, SOB or lightheadedness with hypotension: yes Has patient had a PCN reaction causing severe rash involving mucus membranes or skin necrosis: no Has patient had a PCN reaction that required hospitalization: yes Has patient had a PCN reaction occurring within the last 10 years: no If all of the above answers are "NO", then may proceed with Cephalosporin use.   . Povidone Iodine Itching and Rash  . Tomato Itching  . Chocolate Rash  . Latex Rash  . Other Rash  . Atorvastatin Calcium Nausea Only  . Esomeprazole Magnesium Nausea Only  . Gluten Meal   . Metformin And Related Diarrhea  . Quetiapine Fumarate     Per pt she can not take XR  . Trazodone And Nefazodone Hives  . Zolpidem Tartrate Nausea And Vomiting

## 2018-10-04 ENCOUNTER — Encounter: Payer: Self-pay | Admitting: Family Medicine

## 2018-10-04 ENCOUNTER — Ambulatory Visit (INDEPENDENT_AMBULATORY_CARE_PROVIDER_SITE_OTHER): Payer: Medicare HMO | Admitting: Family Medicine

## 2018-10-04 VITALS — BP 96/49 | HR 100 | Temp 97.8°F | Wt 210.0 lb

## 2018-10-04 DIAGNOSIS — M25552 Pain in left hip: Secondary | ICD-10-CM

## 2018-10-04 DIAGNOSIS — S76019A Strain of muscle, fascia and tendon of unspecified hip, initial encounter: Secondary | ICD-10-CM

## 2018-10-04 NOTE — Patient Instructions (Signed)
Thank you for coming in today. Call or go to the ER if you develop a large red swollen joint with extreme pain or oozing puss.  Recheck sooner if needed.  Let me know how things are going in 2 weeks.  Return sooner if needed.     Gluteus Medius Syndrome  Gluteus medius syndrome causes pain on the outside of your hip due to repeated overstretching or overworking of the gluteus medius muscle. This muscle runs from the top, outer part of your pelvis to the top of your thigh bone (femur). This muscle helps you lift your leg to the side and rotate your leg. It also keeps your hip stable and aligned when you are standing, walking, or running. What are the causes? This condition is caused by small injuries to the gluteus medius muscle over time.  It happens from repetitive movements or a sudden increase in the amount or intensity of activity that involves the legs.  It starts with muscle inflammation and may lead to small tears and scarring in your muscle. What increases the risk? You are more likely to develop this condition if you:  Run on soft or uneven surfaces, such as sand or grass.  Have weakness in your hips and core muscles.  Run long distances.  Increase your time, distance, or intensity of a sport over a short period of time. What are the signs or symptoms? The main symptom of this condition is pain on the outside of your hip with activity.  Usually, the pain will: ? Increase the longer you play sports or run. ? Decrease with rest.  Your hip may also be tender to the touch and you may have muscle spasms. How is this diagnosed? This condition is diagnosed based on your symptoms, medical history, and physical exam.  During the exam, your health care provider will touch different areas of your hip to test for pain.  You may also have imaging studies, such as X-rays and MRI, to rule out other causes of pain. How is this treated? This condition may be treated by a combination  of treatments:  Decreasing mileage or time spent doing sports.  Having a coach help you with your running form.  Stretching or strengthening exercises (physical therapy).  Ice or heat therapy.  Massage.  Local electrical stimulation (transcutaneous electrical nerve stimulation, TENS).  Trigger point injection. A steroid or numbing medicine is injected in the area where you have pain. Follow these instructions at home: Managing pain, stiffness, and swelling      If directed, put ice on the injured area. ? Put ice in a plastic bag. ? Place a towel between your skin and the bag. ? Leave the ice on for 20 minutes, 2-3 times a day.  If directed, apply heat to the affected area before you exercise. Use the heat source that your health care provider recommends, such as a moist heat pack or a heating pad. ? Place a towel between your skin and the heat source. ? Leave the heat on for 20-30 minutes. ? Remove the heat if your skin turns bright red. This is especially important if you are unable to feel pain, heat, or cold. You may have a greater risk of getting burned. Activity  Return to your normal activities as told by your health care provider. Ask your health care provider what activities are safe for you.  Do exercises as told by your physical therapist.  Try not to lie on your painful side.  When lying on your other side, put a pillow between your knees to decrease strain on your top hip muscles. General instructions  Take over-the-counter and prescription medicines only as told by your health care provider.  Keep all follow-up visits as told by your health care provider. This is important. How is this prevented?  Warm up and stretch before being active.  Cool down and stretch after being active.  Give your body time to rest between periods of activity.  Include a variety of exercises and activities in your routine to avoid overuse injuries.  Maintain physical fitness,  including: ? Strength. ? Flexibility. ? Cardiovascular fitness. ? Endurance. Contact a health care provider if:  Your pain does not get better or it gets worse. Summary  Gluteus medius syndrome causes pain on the outside of your hip due to repeated overstretching or overworking of the gluteus medius muscle.  This condition is caused by small injuries to the gluteus medius muscle over time.  Treatments may include physical therapy, massage, and trigger point injection. This information is not intended to replace advice given to you by your health care provider. Make sure you discuss any questions you have with your health care provider. Document Released: 05/16/2005 Document Revised: 10/16/2017 Document Reviewed: 10/16/2017 Elsevier Interactive Patient Education  2019 [employer].

## 2018-10-04 NOTE — Progress Notes (Signed)
Autumn Bowen is a 73 y.o. female who presents to [facility] today for left hip pain.  Patient has ongoing left hip pain thought to be due to trochanteric bursitis.  She failed to improve with typical conservative management and MRI was obtained which showed partial tear of the left gluteus medius tendon.  She is here today for ultrasound-guided injection of the gluteus medius tendon at tear on insertion to greater trochanter.  She has considerable pain especially with ambulation.    ROS:  As above  Exam:  BP (!) 96/49    Pulse 100    Temp 97.8 F (36.6 C) (Oral)    Wt 210 lb (95.3 kg)    LMP 09/21/1974    BMI 37.20 kg/m  Wt Readings from Last 5 Encounters:  10/04/18 210 lb (95.3 kg)  10/03/18 204 lb (92.5 kg)  09/24/18 204 lb (92.5 kg)  09/03/18 204 lb (92.5 kg)  08/24/18 206 lb (93.4 kg)   General: Well Developed, well nourished, and in no acute distress.  Neuro/Psych: Alert and oriented x3, extra-ocular muscles intact, able to move all 4 extremities, sensation grossly intact. Skin: Warm and dry, no rashes noted.  Respiratory: Not using accessory muscles, speaking in full sentences, trachea midline.  Cardiovascular: Pulses palpable, no extremity edema. Abdomen: Does not appear distended. MSK: Left hip: Normal-appearing tender palpation at gluteus medius tendon insertion onto trochanter.  Hip abduction strength is diminished.  Procedure: Real-time Ultrasound Guided Injection of left gluteus medius tendon insertion partial thickness tear at greater trochanter  Device: GE Logiq E   Images permanently stored and available for review in the ultrasound unit. Verbal informed consent obtained.  Discussed risks and benefits of procedure. Warned about infection bleeding damage to structures skin hypopigmentation and fat atrophy among others. Patient expresses understanding and agreement Time-out conducted.   Noted no overlying erythema,  induration, or other signs of local infection.   Skin prepped in a sterile fashion.   Local anesthesia: Topical Ethyl chloride.   With sterile technique and under real time ultrasound guidance:  80 mg of Depo-Medrol and 2 mL of lidocaine injected easily.   Completed without difficulty   Pain immediately resolved suggesting accurate placement of the medication.   Advised to call if fevers/chills, erythema, induration, drainage, or persistent bleeding.   Images permanently stored and available for review in the ultrasound unit.  Impression: Technically successful ultrasound guided injection.       Lab and Radiology Results No results found for this or any previous visit (from the past 72 hour(s)). Mr Hip Left Wo Contrast  Result Date: 09/30/2018 CLINICAL DATA:  Left hip pain since a fall at home on March 2020. EXAM: MR OF THE LEFT HIP WITHOUT CONTRAST TECHNIQUE: Multiplanar, multisequence MR imaging was performed. No intravenous contrast was administered. COMPARISON:  None. FINDINGS: Bones: No hip fracture, dislocation or avascular necrosis. No periosteal reaction or bone destruction. No aggressive osseous lesion. Normal sacrum and sacroiliac joints. No SI joint widening or erosive changes. Degenerative disease with disc height loss at L5-S1 with bilateral facet arthropathy. Articular cartilage and labrum Articular cartilage:  No chondral defect. Labrum: Limited evaluation secondary to lack of intra-articular contrast. Left superior anterior labral degeneration. Joint or bursal effusion Joint effusion:  No hip joint effusion.  No SI joint effusion. Bursae:  No bursa formation. Muscles and tendons Flexors: Normal. Extensors: Normal. Abductors: Normal. Adductors: Normal. Gluteals: Partial-thickness tear of the left gluteus medius tendon insertion. Hamstrings:  Normal. Other findings Miscellaneous: No pelvic free fluid. No fluid collection or hematoma. No inguinal lymphadenopathy. No inguinal hernia.  IMPRESSION: 1. No hip fracture, dislocation or avascular necrosis. 2. Partial-thickness tear of the left gluteus medius tendon insertion. Electronically Signed   By: Kathreen Devoid   On: 09/30/2018 19:09   I personally (independently) visualized and performed the interpretation of the images attached in this note.     Assessment and Plan: 73 y.o. female with left hip pain due to gluteus medius tendon tear.  Failing typical conservative management.  Plan for ultrasound-guided injection today as discussed above.  We will recheck in 2 weeks.  Likely at that time will restart home physical therapy.  Spent time today discussing tear, MRI findings, and modification of home exercise program.  I spent 15 minutes with this patient, greater than 50% was face-to-face time counseling regarding as above including the exercise program.  PDMP not reviewed this encounter. No orders of the defined types were placed in this encounter.  No orders of the defined types were placed in this encounter.   Historical information moved to improve visibility of documentation.  Past Medical History:  Diagnosis Date   Allergy    Notes that grass and some trees causes eyes to sting, burn, and water.   Anemia    Celiac disease    Celiac disease    COPD (chronic obstructive pulmonary disease) (HCC)    Diabetes mellitus type II    Diverticulitis    Dyslipidemia    HOH (hard of hearing)    Hypertension    Hypothyroidism    Sleep apnea    Sore throat    Past Surgical History:  Procedure Laterality Date   APPENDECTOMY     BREAST SURGERY     h/o benign cystleft breast and lymp node removal on right breast area.   EYE SURGERY     Left eye cataract removal, right eye h/o macular degeneration.   INTRAVASCULAR PRESSURE WIRE/FFR STUDY N/A 12/07/2017   Procedure: INTRAVASCULAR PRESSURE WIRE/FFR STUDY;  Surgeon: Leonie Man, MD;  Location: [facility];  Service: Cardiovascular;  Laterality:  N/A;   LEFT HEART CATH AND CORONARY ANGIOGRAPHY N/A 12/07/2017   Procedure: LEFT HEART CATH AND CORONARY ANGIOGRAPHY;  Surgeon: Leonie Man, MD;  Location: [facility];  Service: Cardiovascular;  Laterality: N/A;   SPINE SURGERY     Pt not sure of the type of surgery she had but knows it was L4-5.   VAGINAL HYSTERECTOMY     Social History   Tobacco Use   Smoking status: Current Some Day Smoker    Packs/day: 0.25    Years: 51.00    Pack years: 12.75    Types: Cigarettes    Start date: 01/02/2012   Smokeless tobacco: Never Used   Tobacco comment: Been down to 4 - 6 cigs a day  Substance Use Topics   Alcohol use: No   family history includes Alcohol abuse in her brother; Anxiety disorder in her mother; Asthma in her son; COPD in her father and sister; Cancer in her sister; Depression in her mother; Diabetes in her father and son; Diabetes type II in her mother; Drug abuse in her brother; Glaucoma in her father; Hashimoto's thyroiditis in her sister; Heart attack in her mother; Hypertension in her father; Kidney disease in her sister; Stroke in her mother.  Medications: Current Outpatient Medications  Medication Sig Dispense Refill   ACCU-CHEK AVIVA PLUS test strip  TEST 1-2 TIMES DAILY 100 each 1   acetaminophen (TYLENOL) 500 MG tablet Take 1,000 mg by mouth every 6 (six) hours as needed for mild pain or moderate pain.     albuterol (PROVENTIL HFA;VENTOLIN HFA) 108 (90 Base) MCG/ACT inhaler Inhale 2 puffs into the lungs every 4 (four) hours as needed for wheezing. 2 Inhaler 11   AMBULATORY NON FORMULARY MEDICATION Glucometer, test strips and lancets for testing once a day for DM, uncontrolled. 100 strip 0   AMBULATORY NON FORMULARY MEDICATION Knee-high, medium compression, graduated compression stockings. Apply to lower extremities. Disp per patient preference and insurance coverage. 10 each prn   AMBULATORY NON FORMULARY MEDICATION Standard walker use as  needed. R26.2 M54.9 Disp 1 1 Units prn   amitriptyline (ELAVIL) 50 MG tablet TAKE 1 TABLET BY MOUTH EVERY NIGHT AT BEDTIME 90 tablet 3   aspirin EC 81 MG tablet Take 1 tablet (81 mg total) by mouth daily. 90 tablet 3   BD PEN NEEDLE NANO U/F 32G X 4 MM MISC USE AS DIRECTED 100 each 3   cholecalciferol (VITAMIN D) 1000 units tablet Take 1,000 Units by mouth daily.     colchicine 0.6 MG tablet Take 1 tablet (0.6 mg total) by mouth daily. 90 tablet 3   diazepam (VALIUM) 5 MG tablet Take 1 tab PO 1 hour before procedure or imaging. 5 tablet 0   diclofenac sodium (VOLTAREN) 1 % GEL Apply 4 g topically 4 (four) times daily. To affected joint. 100 g 11   dicyclomine (BENTYL) 10 MG capsule TAKE ONE CAPSULE BY MOUTH TWICE DAILY 180 capsule 0   EPINEPHrine (EPIPEN 2-PAK) 0.3 mg/0.3 mL IJ SOAJ injection Inject 0.3 mLs (0.3 mg total) into the muscle as needed for anaphylaxis. 1 Device prn   famotidine (PEPCID) 20 MG tablet TAKE 1 TABLET(20 MG TOTAL) BY MOUTH DAILY. 90 tablet 3   fluconazole (DIFLUCAN) 150 MG tablet      Fluticasone-Salmeterol (ADVAIR) 250-50 MCG/DOSE AEPB Inhale 1 puff into the lungs every 12 (twelve) hours. 60 each 11   gabapentin (NEURONTIN) 300 MG capsule One tab PO qHS for a week, then BID for a week, then TID. May double weekly to a max of 3,630m/day 180 capsule 3   GLUCOSAMINE-CHONDROITIN PO Take 1 tablet by mouth 2 (two) times daily.      HYDROcodone-acetaminophen (NORCO/VICODIN) 5-325 MG tablet Take 1 tablet by mouth every 6 (six) hours as needed. 15 tablet 0   Insulin Degludec (TRESIBA FLEXTOUCH) 200 UNIT/ML SOPN Inject 28 Units into the skin at bedtime. Increase by 2 units every 5 days until fasting glucose 90-120.     isosorbide mononitrate (IMDUR) 30 MG 24 hr tablet Take 1 tablet (30 mg total) by mouth daily. 90 tablet 3   JARDIANCE 10 MG TABS tablet TAKE 1 TABLET BY MOUTH DAILY 90 tablet 1   levocetirizine (XYZAL) 5 MG tablet Take 5 mg by mouth every  evening.     levothyroxine (SYNTHROID, LEVOTHROID) 150 MCG tablet TAKE 1 TABLET BY MOUTH DAILY BEFORE BREAKFAST 90 tablet 3   LORazepam (ATIVAN) 0.5 MG tablet 1-2 tabs 30 - 60 min prior to MRI. Do not drive with this medicine. 4 tablet 0   losartan (COZAAR) 25 MG tablet TAKE 1 TABLET(25 MG) BY MOUTH DAILY 30 tablet 5   LUMIGAN 0.01 % SOLN Instill 1 drop into both eyes every night  3   metFORMIN (GLUCOPHAGE-XR) 750 MG 24 hr tablet TAKE 1 TABLET(750 MG TOTAL) BY MOUTH DAILY  WITH BREAKFAST. 90 tablet 1   metFORMIN (GLUCOPHAGE-XR) 750 MG 24 hr tablet TAKE 1 TABLET(750 MG TOTAL) BY MOUTH DAILY WITH BREAKFAST. 90 tablet 3   Multiple Vitamins-Minerals (PRESERVISION AREDS 2 PO) Take 1 capsule by mouth 2 (two) times daily.     nitrofurantoin, macrocrystal-monohydrate, (MACROBID) 100 MG capsule Take 1 capsule (100 mg total) by mouth 2 (two) times daily. 10 capsule 0   ondansetron (ZOFRAN) 8 MG tablet TAKE 1 TABLET BY MOUTH EVERY 8 HOURS AS NEEDED FOR NAUSEA AND VOMITING 20 tablet 2   pregabalin (LYRICA) 75 MG capsule Take 1 capsule (75 mg total) by mouth 3 (three) times daily. 90 capsule 1   QUEtiapine (SEROQUEL) 200 MG tablet TAKE 1 TABLET(200 MG) BY MOUTH AT BEDTIME 90 tablet 0   sertraline (ZOLOFT) 100 MG tablet Take 2 tablets (200 mg total) by mouth daily. 120 tablet 3   varenicline (CHANTIX) 1 MG tablet TAKE 1 TABLET BY MOUTH TWICE DAILY( EVERY TWELVE HOURS) 56 tablet 3   nitroGLYCERIN (NITROSTAT) 0.4 MG SL tablet Place 1 tablet (0.4 mg total) under the tongue every 5 (five) minutes as needed for chest pain. 25 tablet 11   rosuvastatin (CRESTOR) 40 MG tablet Take 1 tablet (40 mg total) by mouth daily. 90 tablet 3   No current facility-administered medications for this visit.    Allergies  Allergen Reactions   Bee Venom Anaphylaxis   Iodinated Diagnostic Agents Other (See Comments)    Per patient cardiac arrest IVP DYE-CARDIAC ARREST   Penicillins Anaphylaxis    Has patient had  a PCN reaction causing immediate rash, facial/tongue/throat swelling, SOB or lightheadedness with hypotension: yes Has patient had a PCN reaction causing severe rash involving mucus membranes or skin necrosis: no Has patient had a PCN reaction that required hospitalization: yes Has patient had a PCN reaction occurring within the last 10 years: no If all of the above answers are "NO", then may proceed with Cephalosporin use.    Povidone Iodine Itching and Rash   Tomato Itching   Chocolate Rash   Latex Rash   Other Rash   Atorvastatin Calcium Nausea Only   Esomeprazole Magnesium Nausea Only   Gluten Meal    Metformin And Related Diarrhea   Quetiapine Fumarate     Per pt she can not take XR   Trazodone And Nefazodone Hives   Zolpidem Tartrate Nausea And Vomiting      Discussed warning signs or symptoms. Please see discharge instructions. Patient expresses understanding.

## 2018-10-18 ENCOUNTER — Ambulatory Visit (INDEPENDENT_AMBULATORY_CARE_PROVIDER_SITE_OTHER): Payer: Medicare HMO | Admitting: Family Medicine

## 2018-10-18 ENCOUNTER — Encounter: Payer: Self-pay | Admitting: Family Medicine

## 2018-10-18 VITALS — Wt 210.0 lb

## 2018-10-18 DIAGNOSIS — M5416 Radiculopathy, lumbar region: Secondary | ICD-10-CM

## 2018-10-18 DIAGNOSIS — S76019A Strain of muscle, fascia and tendon of unspecified hip, initial encounter: Secondary | ICD-10-CM

## 2018-10-18 DIAGNOSIS — M25552 Pain in left hip: Secondary | ICD-10-CM | POA: Diagnosis not present

## 2018-10-18 NOTE — Progress Notes (Signed)
Virtual Visit  I connected with      [name]  by a telemedicine application and verified that I am speaking with the correct person using two identifiers.   I discussed the limitations of evaluation and management by telemedicine and the availability of in person appointments. The patient expressed understanding and agreed to proceed.  History of Present Illness: Autumn Bowen is a 73 y.o. female who would like to discuss hip pain.  [name] was seen about 2 weeks ago for left hip ultrasound-guided injection of gluteus medius tendon tear.  She notes she had immediate pain relief and is still feeling pretty good from her left hip.  She notes she is not able to do the full amount of home exercises because she started to have some pain starting back into her left hip.    However she notes considerable right leg pain.  She has pain radiating down the right leg associated with some weakness.  She notes this is become more problematic recently and is very bothered by her symptoms.     Observations/Objective: Wt 210 lb (95.3 kg)   LMP 09/21/1974   BMI 37.20 kg/m  Wt Readings from Last 5 Encounters:  10/18/18 210 lb (95.3 kg)  10/04/18 210 lb (95.3 kg)  10/03/18 204 lb (92.5 kg)  09/24/18 204 lb (92.5 kg)  09/03/18 204 lb (92.5 kg)   Exam: Normal Speech.    Lab and Radiology Results No results found for this or any previous visit (from the past 72 hour(s)). No results found.   Assessment and Plan: 73 y.o. female with left hip pain: Improving following injection.  Plan to continue home exercise program recheck as needed.  Right leg pain: New issue likely lumbar radicular.  Patient does have changes in L-spine on previous MRI.  Plan to reassess in clinic to assess for weakness and likely proceed with epidural steroid injection.  Recheck next week.  PDMP not reviewed this encounter. No orders of the defined types were placed in this encounter.  No orders of the defined  types were placed in this encounter.   Follow Up Instructions:    I discussed the assessment and treatment plan with the patient. The patient was provided an opportunity to ask questions and all were answered. The patient agreed with the plan and demonstrated an understanding of the instructions.   The patient was advised to call back or seek an in-person evaluation if the symptoms worsen or if the condition fails to improve as anticipated.  Time: 15 minutes of intraservice time, with >22 minutes of total time during today's visit.      Historical information moved to improve visibility of documentation.  Past Medical History:  Diagnosis Date  . Allergy    Notes that grass and some trees causes eyes to sting, burn, and water.  . Anemia   . Celiac disease   . Celiac disease   . COPD (chronic obstructive pulmonary disease) ([facility])   . Diabetes mellitus type II   . Diverticulitis   . Dyslipidemia   . HOH (hard of hearing)   . Hypertension   . Hypothyroidism   . Sleep apnea   . Sore throat    Past Surgical History:  Procedure Laterality Date  . APPENDECTOMY    . BREAST SURGERY     h/o benign cystleft breast and lymp node removal on right breast area.  Marland Kitchen EYE SURGERY     Left eye cataract removal, right  eye h/o macular degeneration.  . INTRAVASCULAR PRESSURE WIRE/FFR STUDY N/A 12/07/2017   Procedure: INTRAVASCULAR PRESSURE WIRE/FFR STUDY;  Surgeon: Leonie Man, MD;  Location: [facility];  Service: Cardiovascular;  Laterality: N/A;  . LEFT HEART CATH AND CORONARY ANGIOGRAPHY N/A 12/07/2017   Procedure: LEFT HEART CATH AND CORONARY ANGIOGRAPHY;  Surgeon: Leonie Man, MD;  Location: [facility];  Service: Cardiovascular;  Laterality: N/A;  . SPINE SURGERY     Pt not sure of the type of surgery she had but knows it was L4-5.  Marland Kitchen VAGINAL HYSTERECTOMY     Social History   Tobacco Use  . Smoking status: Current Some Day Smoker    Packs/day: 0.25    Years:  51.00    Pack years: 12.75    Types: Cigarettes    Start date: 01/02/2012  . Smokeless tobacco: Never Used  . Tobacco comment: Been down to 4 - 6 cigs a day  Substance Use Topics  . Alcohol use: No   family history includes Alcohol abuse in her brother; Anxiety disorder in her mother; Asthma in her son; COPD in her father and sister; Cancer in her sister; Depression in her mother; Diabetes in her father and son; Diabetes type II in her mother; Drug abuse in her brother; Glaucoma in her father; Hashimoto's thyroiditis in her sister; Heart attack in her mother; Hypertension in her father; Kidney disease in her sister; Stroke in her mother.  Medications: Current Outpatient Medications  Medication Sig Dispense Refill  . ACCU-CHEK AVIVA PLUS test strip TEST 1-2 TIMES DAILY 100 each 1  . acetaminophen (TYLENOL) 500 MG tablet Take 1,000 mg by mouth every 6 (six) hours as needed for mild pain or moderate pain.    Marland Kitchen albuterol (PROVENTIL HFA;VENTOLIN HFA) 108 (90 Base) MCG/ACT inhaler Inhale 2 puffs into the lungs every 4 (four) hours as needed for wheezing. 2 Inhaler 11  . AMBULATORY NON FORMULARY MEDICATION Glucometer, test strips and lancets for testing once a day for DM, uncontrolled. 100 strip 0  . AMBULATORY NON FORMULARY MEDICATION Knee-high, medium compression, graduated compression stockings. Apply to lower extremities. Disp per patient preference and insurance coverage. 10 each prn  . AMBULATORY NON FORMULARY MEDICATION Standard walker use as needed. R26.2 M54.9 Disp 1 1 Units prn  . amitriptyline (ELAVIL) 50 MG tablet TAKE 1 TABLET BY MOUTH EVERY NIGHT AT BEDTIME 90 tablet 3  . aspirin EC 81 MG tablet Take 1 tablet (81 mg total) by mouth daily. 90 tablet 3  . BD PEN NEEDLE NANO U/F 32G X 4 MM MISC USE AS DIRECTED 100 each 3  . cholecalciferol (VITAMIN D) 1000 units tablet Take 1,000 Units by mouth daily.    . colchicine 0.6 MG tablet Take 1 tablet (0.6 mg total) by mouth daily. 90 tablet 3   . diazepam (VALIUM) 5 MG tablet Take 1 tab PO 1 hour before procedure or imaging. 5 tablet 0  . diclofenac sodium (VOLTAREN) 1 % GEL Apply 4 g topically 4 (four) times daily. To affected joint. 100 g 11  . dicyclomine (BENTYL) 10 MG capsule TAKE ONE CAPSULE BY MOUTH TWICE DAILY 180 capsule 0  . EPINEPHrine (EPIPEN 2-PAK) 0.3 mg/0.3 mL IJ SOAJ injection Inject 0.3 mLs (0.3 mg total) into the muscle as needed for anaphylaxis. 1 Device prn  . famotidine (PEPCID) 20 MG tablet TAKE 1 TABLET(20 MG TOTAL) BY MOUTH DAILY. 90 tablet 3  . Fluticasone-Salmeterol (ADVAIR) 250-50 MCG/DOSE AEPB Inhale 1  puff into the lungs every 12 (twelve) hours. 60 each 11  . gabapentin (NEURONTIN) 300 MG capsule One tab PO qHS for a week, then BID for a week, then TID. May double weekly to a max of 3,66m/day 180 capsule 3  . GLUCOSAMINE-CHONDROITIN PO Take 1 tablet by mouth 2 (two) times daily.     .Marland KitchenHYDROcodone-acetaminophen (NORCO/VICODIN) 5-325 MG tablet Take 1 tablet by mouth every 6 (six) hours as needed. 15 tablet 0  . Insulin Degludec (TRESIBA FLEXTOUCH) 200 UNIT/ML SOPN Inject 28 Units into the skin at bedtime. Increase by 2 units every 5 days until fasting glucose 90-120.    .Marland Kitchenisosorbide mononitrate (IMDUR) 30 MG 24 hr tablet Take 1 tablet (30 mg total) by mouth daily. 90 tablet 3  . JARDIANCE 10 MG TABS tablet TAKE 1 TABLET BY MOUTH DAILY 90 tablet 1  . levocetirizine (XYZAL) 5 MG tablet Take 5 mg by mouth every evening.    .Marland Kitchenlevothyroxine (SYNTHROID, LEVOTHROID) 150 MCG tablet TAKE 1 TABLET BY MOUTH DAILY BEFORE BREAKFAST 90 tablet 3  . LORazepam (ATIVAN) 0.5 MG tablet 1-2 tabs 30 - 60 min prior to MRI. Do not drive with this medicine. 4 tablet 0  . losartan (COZAAR) 25 MG tablet TAKE 1 TABLET(25 MG) BY MOUTH DAILY 30 tablet 5  . LUMIGAN 0.01 % SOLN Instill 1 drop into both eyes every night  3  . metFORMIN (GLUCOPHAGE-XR) 750 MG 24 hr tablet TAKE 1 TABLET(750 MG TOTAL) BY MOUTH DAILY WITH BREAKFAST. 90 tablet 1   . metFORMIN (GLUCOPHAGE-XR) 750 MG 24 hr tablet TAKE 1 TABLET(750 MG TOTAL) BY MOUTH DAILY WITH BREAKFAST. 90 tablet 3  . Multiple Vitamins-Minerals (PRESERVISION AREDS 2 PO) Take 1 capsule by mouth 2 (two) times daily.    . ondansetron (ZOFRAN) 8 MG tablet TAKE 1 TABLET BY MOUTH EVERY 8 HOURS AS NEEDED FOR NAUSEA AND VOMITING 20 tablet 2  . pregabalin (LYRICA) 75 MG capsule Take 1 capsule (75 mg total) by mouth 3 (three) times daily. 90 capsule 1  . QUEtiapine (SEROQUEL) 200 MG tablet TAKE 1 TABLET(200 MG) BY MOUTH AT BEDTIME 90 tablet 0  . sertraline (ZOLOFT) 100 MG tablet Take 2 tablets (200 mg total) by mouth daily. 120 tablet 3  . varenicline (CHANTIX) 1 MG tablet TAKE 1 TABLET BY MOUTH TWICE DAILY( EVERY TWELVE HOURS) 56 tablet 3  . nitroGLYCERIN (NITROSTAT) 0.4 MG SL tablet Place 1 tablet (0.4 mg total) under the tongue every 5 (five) minutes as needed for chest pain. 25 tablet 11  . rosuvastatin (CRESTOR) 40 MG tablet Take 1 tablet (40 mg total) by mouth daily. 90 tablet 3   No current facility-administered medications for this visit.    Allergies  Allergen Reactions  . Bee Venom Anaphylaxis  . Iodinated Diagnostic Agents Other (See Comments)    Per patient cardiac arrest IVP DYE-CARDIAC ARREST  . Penicillins Anaphylaxis    Has patient had a PCN reaction causing immediate rash, facial/tongue/throat swelling, SOB or lightheadedness with hypotension: yes Has patient had a PCN reaction causing severe rash involving mucus membranes or skin necrosis: no Has patient had a PCN reaction that required hospitalization: yes Has patient had a PCN reaction occurring within the last 10 years: no If all of the above answers are "NO", then may proceed with Cephalosporin use.   . Povidone Iodine Itching and Rash  . Tomato Itching  . Chocolate Rash  . Latex Rash  . Other Rash  . Atorvastatin Calcium  Nausea Only  . Esomeprazole Magnesium Nausea Only  . Gluten Meal   . Metformin And Related  Diarrhea  . Quetiapine Fumarate     Per pt she can not take XR  . Trazodone And Nefazodone Hives  . Zolpidem Tartrate Nausea And Vomiting

## 2018-10-18 NOTE — Progress Notes (Signed)
Trying exercises at home, can do 5 reps (supposed to do 10) of his exercise but very painful   Right leg painful

## 2018-10-19 ENCOUNTER — Other Ambulatory Visit: Payer: Self-pay | Admitting: Cardiology

## 2018-10-23 ENCOUNTER — Ambulatory Visit: Payer: Medicare HMO | Admitting: Family Medicine

## 2018-10-24 ENCOUNTER — Other Ambulatory Visit: Payer: Self-pay | Admitting: Osteopathic Medicine

## 2018-10-24 ENCOUNTER — Encounter: Payer: Self-pay | Admitting: Family Medicine

## 2018-10-24 ENCOUNTER — Ambulatory Visit (INDEPENDENT_AMBULATORY_CARE_PROVIDER_SITE_OTHER): Payer: Medicare HMO | Admitting: Family Medicine

## 2018-10-24 VITALS — BP 117/73 | HR 92 | Temp 98.3°F | Wt 211.0 lb

## 2018-10-24 DIAGNOSIS — G8929 Other chronic pain: Secondary | ICD-10-CM

## 2018-10-24 DIAGNOSIS — N898 Other specified noninflammatory disorders of vagina: Secondary | ICD-10-CM

## 2018-10-24 DIAGNOSIS — M79604 Pain in right leg: Secondary | ICD-10-CM | POA: Diagnosis not present

## 2018-10-24 DIAGNOSIS — M533 Sacrococcygeal disorders, not elsewhere classified: Secondary | ICD-10-CM

## 2018-10-24 LAB — WET PREP FOR TRICH, YEAST, CLUE
MICRO NUMBER:: 509981
Specimen Quality: ADEQUATE

## 2018-10-24 NOTE — Progress Notes (Signed)
Autumn Bowen is a 73 y.o. female who presents to [facility]: [facility] today for right leg pain and vaginal discharge.  [name] been seen multiple times and March April and May for left lateral hip pain.  Eventually after MRI showing gluteus medius tendon tear she had targeted ultrasound guided injection of the tear and has had significant improvement in her left hip pain.  Since her hips feeling better she is able to pay more attention to her other pain.  She notes significant pain located in the right low back radiating down the leg to the lateral calf.  This is worse with activity and better with rest.  This pain is quite severe at times.  She denies severe weakness or numbness or new bowel bladder dysfunction.  Additionally she notes that she has been having ongoing issue with clear vaginal discharge.  She notes this tends to cause some itching.  She is had various treatments but not had much benefit.     ROS as above:  Exam:  BP 117/73   Pulse 92   Temp 98.3 F (36.8 C) (Oral)   Wt 211 lb (95.7 kg)   LMP 09/21/1974   SpO2 97%   BMI 37.38 kg/m  Wt Readings from Last 5 Encounters:  10/24/18 211 lb (95.7 kg)  10/18/18 210 lb (95.3 kg)  10/04/18 210 lb (95.3 kg)  10/03/18 204 lb (92.5 kg)  09/24/18 204 lb (92.5 kg)    Gen: Well NAD HEENT: EOMI,  MMM Lungs: Normal work of breathing. CTABL Heart: RRR no MRG Abd: NABS, Soft. Nondistended, Nontender Exts: Brisk capillary refill, warm and well perfused. MSK: Tender to palpation right SI joint. L-spine nontender to midline.  Lumbar motion diminished. Mildly positive right-sided slump test. Lower extremity strength and reflexes are intact. Right hip normal-appearing not particularly tender greater trochanter.  Some tender at ischial tuberosity.  Some pain with resisted knee flexion.  Some pain with resisted hip abduction   Lab and Radiology Results  CLINICAL DATA:  Chronic left-sided low back pain  EXAM: MRI LUMBAR SPINE WITHOUT CONTRAST  TECHNIQUE: Multiplanar, multisequence MR imaging of the lumbar spine was performed. No intravenous contrast was administered.  COMPARISON:  Radiography 11/16/2017  FINDINGS: Segmentation: Transitional L5 vertebra. The T12 ribs are hypoplastic based on rib series 05/16/2009 and lumbar radiography 11/16/2017.  Alignment:  Grade 1 anterolisthesis at L3-4  Vertebrae: No fracture, evidence of discitis, or aggressive bone lesion. T11 body hemangioma.  Conus medullaris and cauda equina: Conus extends to the T12-L1 level. Conus and cauda equina appear normal.  Paraspinal and other soft tissues: Fatty atrophy of intrinsic back muscles.  Disc levels:  T12- L1: Spondylosis and disc narrowing with asymmetric right facet spurring. Moderate right foraminal narrowing.  L1-L2: Disc narrowing and mild posterior element hypertrophy. No impingement  L2-L3: Disc narrowing with mild bulging. Posterior element hypertrophy. Moderate spinal stenosis. Noncompressive moderate bilateral foraminal narrowing.  L3-L4: Advanced facet arthropathy with hypertrophy and anterolisthesis. Laminectomy at this level. The canal and foramina are patent  L4-L5: Greatest level of disc narrowing with mild bulging and endplate ridging. Facet spurs. Decompressive laminectomy. Patent canal and foramina  L5-S1:Rudimentary disc space.  No impingement  IMPRESSION: 1. Transitional anatomy with incomplete segmentation of L5. This is based on rib counting with hypoplastic twelfth ribs on a 2010 rib series. 2. Generalized facet degeneration greatest at L2-3 and L3-4. There is related grade 1 anterolisthesis at L3-4. 3.  L2-3 moderate spinal stenosis. 4. L3-4 and L4-5 laminectomy with patent canal. 5. Noncompressive foraminal narrowing greatest at T12-L1 on the right    Electronically Signed   By: Monte Fantasia M.D.   On: 12/25/2017 14:25 I personally (independently) visualized and performed the interpretation of the images attached in this note.  Procedure: Real-time Ultrasound Guided Injection of right SI joint Device: GE Logiq E   Images permanently stored and available for review in the ultrasound unit. Verbal informed consent obtained.  Discussed risks and benefits of procedure. Warned about infection bleeding damage to structures skin hypopigmentation and fat atrophy among others. Patient expresses understanding and agreement Time-out conducted.   Noted no overlying erythema, induration, or other signs of local infection.   Skin prepped in a sterile fashion.   Local anesthesia: Topical Ethyl chloride.   With sterile technique and under real time ultrasound guidance:  40 mg of Depo-Medrol and 3 mL of lidocaine injected easily.   Completed without difficulty   Pain partially resolved suggesting accurate placement of the medication.   Advised to call if fevers/chills, erythema, induration, drainage, or persistent bleeding.   Images permanently stored and available for review in the ultrasound unit.  Impression: Technically successful ultrasound guided injection.      Assessment and Plan: 73 y.o. female with right low back pain radiating down right leg.  Etiology is somewhat unclear.  She is tender to palpation at the right SI joint and she does have changes on her MRI that could explain some of her pain.  Pain is likely multifactorial.  I suspect she has some facet degenerative changes that are causing her pain.  I think also she has some SI joint dysfunction that is causing her pain.  Also I suspect she probably has some lumbar radiculopathy likely at the L5 or S1 nerve root causing pain and possibly some hamstring tendinopathy however this was not seen well on MRI recently of hip in May.  Plan to proceed with SI joint injection as noted above and  relative rest.  If not improving next that would be epidural steroid injection.  Additionally patient has vaginal discharge that is a bit itchy.  She declines pelvic exam today.  Plan to proceed with self wet prep to help guide therapy.  PDMP not reviewed this encounter. Orders Placed This Encounter  Procedures  . WET PREP FOR TRICH, YEAST, CLUE   No orders of the defined types were placed in this encounter.    Historical information moved to improve visibility of documentation.  Past Medical History:  Diagnosis Date  . Allergy    Notes that grass and some trees causes eyes to sting, burn, and water.  . Anemia   . Celiac disease   . Celiac disease   . COPD (chronic obstructive pulmonary disease) ([place])   . Diabetes mellitus type II   . Diverticulitis   . Dyslipidemia   . HOH (hard of hearing)   . Hypertension   . Hypothyroidism   . Sleep apnea   . Sore throat    Past Surgical History:  Procedure Laterality Date  . APPENDECTOMY    . BREAST SURGERY     h/o benign cystleft breast and lymp node removal on right breast area.  Marland Kitchen EYE SURGERY     Left eye cataract removal, right eye h/o macular degeneration.  . INTRAVASCULAR PRESSURE WIRE/FFR STUDY N/A 12/07/2017   Procedure: INTRAVASCULAR PRESSURE WIRE/FFR STUDY;  Surgeon: Leonie Man, MD;  Location: [facility];  Service: Cardiovascular;  Laterality: N/A;  . LEFT HEART CATH AND CORONARY ANGIOGRAPHY N/A 12/07/2017   Procedure: LEFT HEART CATH AND CORONARY ANGIOGRAPHY;  Surgeon: Leonie Man, MD;  Location: [facility];  Service: Cardiovascular;  Laterality: N/A;  . SPINE SURGERY     Pt not sure of the type of surgery she had but knows it was L4-5.  Marland Kitchen VAGINAL HYSTERECTOMY     Social History   Tobacco Use  . Smoking status: Current Some Day Smoker    Packs/day: 0.25    Years: 51.00    Pack years: 12.75    Types: Cigarettes    Start date: 01/02/2012  . Smokeless tobacco: Never Used  . Tobacco comment:  Been down to 4 - 6 cigs a day  Substance Use Topics  . Alcohol use: No   family history includes Alcohol abuse in her brother; Anxiety disorder in her mother; Asthma in her son; COPD in her father and sister; Cancer in her sister; Depression in her mother; Diabetes in her father and son; Diabetes type II in her mother; Drug abuse in her brother; Glaucoma in her father; Hashimoto's thyroiditis in her sister; Heart attack in her mother; Hypertension in her father; Kidney disease in her sister; Stroke in her mother.  Medications: Current Outpatient Medications  Medication Sig Dispense Refill  . ACCU-CHEK AVIVA PLUS test strip TEST 1-2 TIMES DAILY 100 each 1  . acetaminophen (TYLENOL) 500 MG tablet Take 1,000 mg by mouth every 6 (six) hours as needed for mild pain or moderate pain.    Marland Kitchen albuterol (PROVENTIL HFA;VENTOLIN HFA) 108 (90 Base) MCG/ACT inhaler Inhale 2 puffs into the lungs every 4 (four) hours as needed for wheezing. 2 Inhaler 11  . AMBULATORY NON FORMULARY MEDICATION Glucometer, test strips and lancets for testing once a day for DM, uncontrolled. 100 strip 0  . AMBULATORY NON FORMULARY MEDICATION Knee-high, medium compression, graduated compression stockings. Apply to lower extremities. Disp per patient preference and insurance coverage. 10 each prn  . AMBULATORY NON FORMULARY MEDICATION Standard walker use as needed. R26.2 M54.9 Disp 1 1 Units prn  . amitriptyline (ELAVIL) 50 MG tablet TAKE 1 TABLET BY MOUTH EVERY NIGHT AT BEDTIME 90 tablet 3  . aspirin EC 81 MG tablet Take 1 tablet (81 mg total) by mouth daily. 90 tablet 3  . BD PEN NEEDLE NANO U/F 32G X 4 MM MISC USE AS DIRECTED 100 each 3  . cholecalciferol (VITAMIN D) 1000 units tablet Take 1,000 Units by mouth daily.    . colchicine 0.6 MG tablet Take 1 tablet (0.6 mg total) by mouth daily. 90 tablet 3  . diazepam (VALIUM) 5 MG tablet Take 1 tab PO 1 hour before procedure or imaging. 5 tablet 0  . diclofenac sodium (VOLTAREN) 1  % GEL Apply 4 g topically 4 (four) times daily. To affected joint. 100 g 11  . dicyclomine (BENTYL) 10 MG capsule TAKE ONE CAPSULE BY MOUTH TWICE DAILY 180 capsule 0  . EPINEPHrine (EPIPEN 2-PAK) 0.3 mg/0.3 mL IJ SOAJ injection Inject 0.3 mLs (0.3 mg total) into the muscle as needed for anaphylaxis. 1 Device prn  . famotidine (PEPCID) 20 MG tablet TAKE 1 TABLET(20 MG TOTAL) BY MOUTH DAILY. 90 tablet 3  . Fluticasone-Salmeterol (ADVAIR) 250-50 MCG/DOSE AEPB Inhale 1 puff into the lungs every 12 (twelve) hours. 60 each 11  . gabapentin (NEURONTIN) 300 MG capsule One tab PO qHS for a week, then BID for a  week, then TID. May double weekly to a max of 3,647m/day 180 capsule 3  . GLUCOSAMINE-CHONDROITIN PO Take 1 tablet by mouth 2 (two) times daily.     .Marland KitchenHYDROcodone-acetaminophen (NORCO/VICODIN) 5-325 MG tablet Take 1 tablet by mouth every 6 (six) hours as needed. 15 tablet 0  . Insulin Degludec (TRESIBA FLEXTOUCH) 200 UNIT/ML SOPN Inject 28 Units into the skin at bedtime. Increase by 2 units every 5 days until fasting glucose 90-120.    .Marland Kitchenisosorbide mononitrate (IMDUR) 30 MG 24 hr tablet TAKE 1 TABLET(30 MG) BY MOUTH DAILY 90 tablet 1  . JARDIANCE 10 MG TABS tablet TAKE 1 TABLET BY MOUTH DAILY 90 tablet 1  . levocetirizine (XYZAL) 5 MG tablet Take 5 mg by mouth every evening.    .Marland Kitchenlevothyroxine (SYNTHROID, LEVOTHROID) 150 MCG tablet TAKE 1 TABLET BY MOUTH DAILY BEFORE BREAKFAST 90 tablet 3  . LORazepam (ATIVAN) 0.5 MG tablet 1-2 tabs 30 - 60 min prior to MRI. Do not drive with this medicine. 4 tablet 0  . losartan (COZAAR) 25 MG tablet TAKE 1 TABLET(25 MG) BY MOUTH DAILY 30 tablet 5  . LUMIGAN 0.01 % SOLN Instill 1 drop into both eyes every night  3  . metFORMIN (GLUCOPHAGE-XR) 750 MG 24 hr tablet TAKE 1 TABLET(750 MG TOTAL) BY MOUTH DAILY WITH BREAKFAST. 90 tablet 1  . metFORMIN (GLUCOPHAGE-XR) 750 MG 24 hr tablet TAKE 1 TABLET(750 MG TOTAL) BY MOUTH DAILY WITH BREAKFAST. 90 tablet 3  . Multiple  Vitamins-Minerals (PRESERVISION AREDS 2 PO) Take 1 capsule by mouth 2 (two) times daily.    . ondansetron (ZOFRAN) 8 MG tablet TAKE 1 TABLET BY MOUTH EVERY 8 HOURS AS NEEDED FOR NAUSEA AND VOMITING 20 tablet 2  . pregabalin (LYRICA) 75 MG capsule Take 1 capsule (75 mg total) by mouth 3 (three) times daily. 90 capsule 1  . QUEtiapine (SEROQUEL) 200 MG tablet TAKE 1 TABLET(200 MG) BY MOUTH AT BEDTIME 90 tablet 0  . sertraline (ZOLOFT) 100 MG tablet Take 2 tablets (200 mg total) by mouth daily. 120 tablet 3  . varenicline (CHANTIX) 1 MG tablet TAKE 1 TABLET BY MOUTH TWICE DAILY( EVERY TWELVE HOURS) 56 tablet 3  . nitroGLYCERIN (NITROSTAT) 0.4 MG SL tablet Place 1 tablet (0.4 mg total) under the tongue every 5 (five) minutes as needed for chest pain. 25 tablet 11  . rosuvastatin (CRESTOR) 40 MG tablet Take 1 tablet (40 mg total) by mouth daily. 90 tablet 3   No current facility-administered medications for this visit.    Allergies  Allergen Reactions  . Bee Venom Anaphylaxis  . Iodinated Diagnostic Agents Other (See Comments)    Per patient cardiac arrest IVP DYE-CARDIAC ARREST  . Penicillins Anaphylaxis    Has patient had a PCN reaction causing immediate rash, facial/tongue/throat swelling, SOB or lightheadedness with hypotension: yes Has patient had a PCN reaction causing severe rash involving mucus membranes or skin necrosis: no Has patient had a PCN reaction that required hospitalization: yes Has patient had a PCN reaction occurring within the last 10 years: no If all of the above answers are "NO", then may proceed with Cephalosporin use.   . Povidone Iodine Itching and Rash  . Tomato Itching  . Chocolate Rash  . Latex Rash  . Other Rash  . Atorvastatin Calcium Nausea Only  . Esomeprazole Magnesium Nausea Only  . Gluten Meal   . Metformin And Related Diarrhea  . Quetiapine Fumarate     Per pt she can  not take XR  . Trazodone And Nefazodone Hives  . Zolpidem Tartrate Nausea And  Vomiting     Discussed warning signs or symptoms. Please see discharge instructions. Patient expresses understanding.

## 2018-10-24 NOTE — Patient Instructions (Signed)
Thank you for coming in today. We will do the back injection if not better.  COntinue home PT.  Call or go to the ER if you develop a large red swollen joint with extreme pain or oozing puss.   We will get results of self swab.   Let me know if the back and leg pain does not improve. Next step is the fancy back injection.

## 2018-10-26 ENCOUNTER — Other Ambulatory Visit: Payer: Self-pay | Admitting: Osteopathic Medicine

## 2018-10-26 DIAGNOSIS — E118 Type 2 diabetes mellitus with unspecified complications: Secondary | ICD-10-CM

## 2018-10-26 DIAGNOSIS — Z794 Long term (current) use of insulin: Secondary | ICD-10-CM

## 2018-10-26 NOTE — Telephone Encounter (Signed)
Pharmacy sent request for Tyler Aas to inject 10 units at bedtime and increase by 2 units every 5 days until fasting glucose is at 120.  Per last visit for DM on 07/31/18 and med list, it states patient is injecting 28 units into the skin at bedtime and increasing by 2 units every 5 days until fasting glucose is 90-120.   Can you please confirm correct directions so refill can be sent? RX request cancelled to clarify directions. RX pended, please review. Thanks!

## 2018-10-29 MED ORDER — INSULIN DEGLUDEC 200 UNIT/ML ~~LOC~~ SOPN: 28.0000 [IU] | PEN_INJECTOR | Freq: Every day | SUBCUTANEOUS | 99 refills | Status: DC

## 2018-11-02 DIAGNOSIS — Z79899 Other long term (current) drug therapy: Secondary | ICD-10-CM | POA: Diagnosis not present

## 2018-11-02 DIAGNOSIS — R52 Pain, unspecified: Secondary | ICD-10-CM | POA: Diagnosis not present

## 2018-11-02 DIAGNOSIS — Z794 Long term (current) use of insulin: Secondary | ICD-10-CM | POA: Diagnosis not present

## 2018-11-02 DIAGNOSIS — S40012A Contusion of left shoulder, initial encounter: Secondary | ICD-10-CM | POA: Diagnosis not present

## 2018-11-02 DIAGNOSIS — E119 Type 2 diabetes mellitus without complications: Secondary | ICD-10-CM | POA: Diagnosis not present

## 2018-11-02 DIAGNOSIS — Z7982 Long term (current) use of aspirin: Secondary | ICD-10-CM | POA: Diagnosis not present

## 2018-11-02 DIAGNOSIS — W19XXXA Unspecified fall, initial encounter: Secondary | ICD-10-CM | POA: Diagnosis not present

## 2018-11-02 DIAGNOSIS — M25519 Pain in unspecified shoulder: Secondary | ICD-10-CM | POA: Diagnosis not present

## 2018-11-02 DIAGNOSIS — Z043 Encounter for examination and observation following other accident: Secondary | ICD-10-CM | POA: Diagnosis not present

## 2018-11-02 DIAGNOSIS — G8911 Acute pain due to trauma: Secondary | ICD-10-CM | POA: Diagnosis not present

## 2018-11-02 DIAGNOSIS — R42 Dizziness and giddiness: Secondary | ICD-10-CM | POA: Diagnosis not present

## 2018-11-02 DIAGNOSIS — I959 Hypotension, unspecified: Secondary | ICD-10-CM | POA: Diagnosis not present

## 2018-11-02 DIAGNOSIS — F17210 Nicotine dependence, cigarettes, uncomplicated: Secondary | ICD-10-CM | POA: Diagnosis not present

## 2018-11-02 DIAGNOSIS — Z87892 Personal history of anaphylaxis: Secondary | ICD-10-CM | POA: Diagnosis not present

## 2018-11-02 DIAGNOSIS — T148XXA Other injury of unspecified body region, initial encounter: Secondary | ICD-10-CM | POA: Diagnosis not present

## 2018-11-02 DIAGNOSIS — S40022A Contusion of left upper arm, initial encounter: Secondary | ICD-10-CM | POA: Diagnosis not present

## 2018-11-09 ENCOUNTER — Encounter: Payer: Self-pay | Admitting: Osteopathic Medicine

## 2018-11-09 ENCOUNTER — Other Ambulatory Visit: Payer: Self-pay | Admitting: Osteopathic Medicine

## 2018-11-09 ENCOUNTER — Ambulatory Visit (INDEPENDENT_AMBULATORY_CARE_PROVIDER_SITE_OTHER): Payer: Medicare HMO | Admitting: Osteopathic Medicine

## 2018-11-09 VITALS — BP 128/76 | HR 91 | Temp 98.2°F | Wt 217.1 lb

## 2018-11-09 DIAGNOSIS — R29898 Other symptoms and signs involving the musculoskeletal system: Secondary | ICD-10-CM | POA: Diagnosis not present

## 2018-11-09 DIAGNOSIS — M79605 Pain in left leg: Secondary | ICD-10-CM

## 2018-11-09 DIAGNOSIS — M5432 Sciatica, left side: Secondary | ICD-10-CM

## 2018-11-09 DIAGNOSIS — M5416 Radiculopathy, lumbar region: Secondary | ICD-10-CM

## 2018-11-09 DIAGNOSIS — M549 Dorsalgia, unspecified: Secondary | ICD-10-CM | POA: Diagnosis not present

## 2018-11-09 DIAGNOSIS — R262 Difficulty in walking, not elsewhere classified: Secondary | ICD-10-CM | POA: Diagnosis not present

## 2018-11-09 NOTE — Patient Instructions (Signed)
Plan:  I would like you to stop taking your losartan, this is a blood pressure medicine.  I think stopping this might help with the dizziness which I think is due to your blood pressure dropping.  My real concern here is the falling which I think is more to do with leg weakness and back pain, possible arthritis compressing the nerves or other deterioration of the nerves.  If you are not too excited about physical therapy or injections, I think at this point we need to determination from a specialist about what other options we have.

## 2018-11-09 NOTE — Progress Notes (Signed)
HPI: Autumn Bowen is a 73 y.o. female who  has a past medical history of Allergy, Anemia, Celiac disease, Celiac disease, COPD (chronic obstructive pulmonary disease) ([place]), Diabetes mellitus type II, Diverticulitis, Dyslipidemia, HOH (hard of hearing), Hypertension, Hypothyroidism, Sleep apnea, and Sore throat.  she presents to [facility] today, 11/09/18,  for chief complaint of: Fall - ER follow-up   Patient suffered a fall at home, called EMS, they advised transfer to the emergency department, this was 11/02/2018, last week.  Since then, patient states arm soreness has been steady but a bit better, she is concerned about some extensive bruising on the left arm.  Her greatest complaint is left lower back and leg pain and weakness.  She reports persistent dizziness/lightheadedness, she describes methods that she has learned from physical and occupational therapy as far as reducing the effects of orthostatic hypotension and she does not seem to be falling when she is changing position or when she gets lightheaded.  She states that the fall that she suffered last week was from walking around the house and feeling like her leg was giving out on her and she fell from this, she reported no dizziness at this time.  MRI of lumbar spine done in July of last year, patient was advised by Dr. Darene Lamer that injections would probably be needed, looks like these were ordered but never done.  Patient has a lot of anxiety around doing injections due to history of previous MI apparently with contrast.  She also thinks that she has gained maximal benefit from physical therapy.    Past medical, surgical, social and family history reviewed:  Patient Active Problem List   Diagnosis Date Noted  . Tear of gluteus medius tendon 10/03/2018  . Abnormal weight gain 02/21/2018  . Injury of left knee 02/21/2018  . Microalbuminuria due to type 2 diabetes mellitus ([place]) 02/21/2018  .  Hyperlipidemia associated with type 2 diabetes mellitus ([place]) 02/21/2018  . CAD (coronary artery disease) 01/01/2018  . Chronic pain syndrome 12/13/2017  . Angina pectoris ([place]) 12/07/2017  . Diabetes mellitus due to underlying condition with unspecified complications ([place]) 40/97/3532  . Radiographic dye allergy status 12/05/2017  . Pseudogout of left knee 11/16/2017  . Hypotension 09/22/2016  . Osteopenia 05/24/2016  . Abdominal pain 04/12/2016  . Diverticulitis of colon 04/12/2016  . Broken or cracked tooth, nontraumatic 03/27/2016  . Memory deficit 05/05/2015  . Balance problems 05/05/2015  . Hearing difficulty of both ears 05/05/2015  . Mouth lesion 05/04/2015  . OSA (obstructive sleep apnea) 04/28/2015  . Seborrheic keratoses 01/31/2015  . Left-sided low back pain with left-sided sciatica 01/31/2015  . GERD (gastroesophageal reflux disease) 01/26/2015  . Celiac disease 01/26/2015  . Cervical spondylosis 12/04/2014  . Osteoarthritis of first metatarsophalangeal joint 11/17/2014  . Ingrown left big toenail 10/04/2014  . Seasonal allergies 10/04/2014  . Neuropathy 10/04/2014  . Toenail fungus 10/03/2014  . Adenomatous polyp of colon 07/30/2014  . Macular degeneration, dry 07/29/2014  . Type 2 diabetes mellitus with complication ([place]) 99/24/2683  . Migraine without aura and without status migrainosus, not intractable 07/28/2014  . EMPHYSEMA 05/11/2009  . COPD (chronic obstructive pulmonary disease) with chronic bronchitis ([place]) 05/10/2009  . VISION DISORDER 12/03/2008  . OSTEOPENIA 11/18/2008  . VITAMIN D DEFICIENCY 11/06/2008  . Major depressive disorder 11/05/2008  . FATIGUE 11/05/2008  . ANEMIA 10/23/2008  . DYSLIPIDEMIA 10/16/2008  . TOBACCO ABUSE 10/15/2008  . ALLERGIC RHINITIS 10/15/2008  . FRACTURE, ANKLE,  RIGHT 04/29/2008  . DIARRHEA 07/01/2007  . POST TRAUMATIC STRESS SYNDROME 05/30/2000  . Hypothyroidism 05/30/1988  . POSTMENOPAUSAL STATUS 05/30/1968     Past Surgical History:  Procedure Laterality Date  . APPENDECTOMY    . BREAST SURGERY     h/o benign cystleft breast and lymp node removal on right breast area.  Marland Kitchen EYE SURGERY     Left eye cataract removal, right eye h/o macular degeneration.  . INTRAVASCULAR PRESSURE WIRE/FFR STUDY N/A 12/07/2017   Procedure: INTRAVASCULAR PRESSURE WIRE/FFR STUDY;  Surgeon: Leonie Man, MD;  Location: [facility];  Service: Cardiovascular;  Laterality: N/A;  . LEFT HEART CATH AND CORONARY ANGIOGRAPHY N/A 12/07/2017   Procedure: LEFT HEART CATH AND CORONARY ANGIOGRAPHY;  Surgeon: Leonie Man, MD;  Location: [facility];  Service: Cardiovascular;  Laterality: N/A;  . SPINE SURGERY     Pt not sure of the type of surgery she had but knows it was L4-5.  Marland Kitchen VAGINAL HYSTERECTOMY      Social History   Tobacco Use  . Smoking status: Current Some Day Smoker    Packs/day: 0.25    Years: 51.00    Pack years: 12.75    Types: Cigarettes    Start date: 01/02/2012  . Smokeless tobacco: Never Used  . Tobacco comment: Been down to 4 - 6 cigs a day  Substance Use Topics  . Alcohol use: No    Family History  Problem Relation Age of Onset  . Anxiety disorder Mother   . Depression Mother   . Heart attack Mother   . Stroke Mother   . Diabetes type II Mother   . Diabetes Father   . Glaucoma Father   . Hypertension Father   . COPD Father   . COPD Sister   . Hashimoto's thyroiditis Sister   . Cancer Sister   . Kidney disease Sister   . Drug abuse Brother   . Alcohol abuse Brother   . Asthma Son   . Diabetes Son      Current medication list and allergy/intolerance information reviewed:    Current Outpatient Medications  Medication Sig Dispense Refill  . ACCU-CHEK AVIVA PLUS test strip TEST ONCE TO TWICE DAILY 100 each 1  . acetaminophen (TYLENOL) 500 MG tablet Take 1,000 mg by mouth every 6 (six) hours as needed for mild pain or moderate pain.    Marland Kitchen albuterol (PROVENTIL  HFA;VENTOLIN HFA) 108 (90 Base) MCG/ACT inhaler Inhale 2 puffs into the lungs every 4 (four) hours as needed for wheezing. 2 Inhaler 11  . AMBULATORY NON FORMULARY MEDICATION Glucometer, test strips and lancets for testing once a day for DM, uncontrolled. 100 strip 0  . AMBULATORY NON FORMULARY MEDICATION Knee-high, medium compression, graduated compression stockings. Apply to lower extremities. Disp per patient preference and insurance coverage. 10 each prn  . AMBULATORY NON FORMULARY MEDICATION Standard walker use as needed. R26.2 M54.9 Disp 1 1 Units prn  . amitriptyline (ELAVIL) 50 MG tablet TAKE 1 TABLET BY MOUTH EVERY NIGHT AT BEDTIME 90 tablet 3  . aspirin EC 81 MG tablet Take 1 tablet (81 mg total) by mouth daily. 90 tablet 3  . BD PEN NEEDLE NANO U/F 32G X 4 MM MISC USE AS DIRECTED 100 each 3  . cholecalciferol (VITAMIN D) 1000 units tablet Take 1,000 Units by mouth daily.    . colchicine 0.6 MG tablet Take 1 tablet (0.6 mg total) by mouth daily. 90 tablet 3  .  diclofenac sodium (VOLTAREN) 1 % GEL Apply 4 g topically 4 (four) times daily. To affected joint. 100 g 11  . dicyclomine (BENTYL) 10 MG capsule TAKE ONE CAPSULE BY MOUTH TWICE DAILY 180 capsule 0  . EPINEPHrine (EPIPEN 2-PAK) 0.3 mg/0.3 mL IJ SOAJ injection Inject 0.3 mLs (0.3 mg total) into the muscle as needed for anaphylaxis. 1 Device prn  . famotidine (PEPCID) 20 MG tablet TAKE 1 TABLET(20 MG TOTAL) BY MOUTH DAILY. 90 tablet 3  . Fluticasone-Salmeterol (ADVAIR) 250-50 MCG/DOSE AEPB Inhale 1 puff into the lungs every 12 (twelve) hours. 60 each 11  . gabapentin (NEURONTIN) 300 MG capsule One tab PO qHS for a week, then BID for a week, then TID. May double weekly to a max of 3,621m/day 180 capsule 3  . GLUCOSAMINE-CHONDROITIN PO Take 1 tablet by mouth 2 (two) times daily.     .Marland KitchenHYDROcodone-acetaminophen (NORCO/VICODIN) 5-325 MG tablet Take 1 tablet by mouth every 6 (six) hours as needed. 15 tablet 0  . Insulin Degludec (TRESIBA  FLEXTOUCH) 200 UNIT/ML SOPN Inject 28-60 Units into the skin at bedtime. 15 mL 99  . isosorbide mononitrate (IMDUR) 30 MG 24 hr tablet TAKE 1 TABLET(30 MG) BY MOUTH DAILY 90 tablet 1  . JARDIANCE 10 MG TABS tablet TAKE 1 TABLET BY MOUTH DAILY 90 tablet 1  . levocetirizine (XYZAL) 5 MG tablet Take 5 mg by mouth every evening.    .Marland Kitchenlevothyroxine (SYNTHROID, LEVOTHROID) 150 MCG tablet TAKE 1 TABLET BY MOUTH DAILY BEFORE BREAKFAST 90 tablet 3  . losartan (COZAAR) 25 MG tablet TAKE 1 TABLET(25 MG) BY MOUTH DAILY 30 tablet 5  . LUMIGAN 0.01 % SOLN Instill 1 drop into both eyes every night  3  . meclizine (ANTIVERT) 25 MG tablet     . metFORMIN (GLUCOPHAGE-XR) 750 MG 24 hr tablet TAKE 1 TABLET(750 MG TOTAL) BY MOUTH DAILY WITH BREAKFAST. 90 tablet 1  . Multiple Vitamins-Minerals (PRESERVISION AREDS 2 PO) Take 1 capsule by mouth 2 (two) times daily.    . ondansetron (ZOFRAN) 8 MG tablet TAKE 1 TABLET BY MOUTH EVERY 8 HOURS AS NEEDED FOR NAUSEA AND VOMITING 20 tablet 2  . pregabalin (LYRICA) 75 MG capsule Take 1 capsule (75 mg total) by mouth 3 (three) times daily. 90 capsule 1  . propranolol ER (INDERAL LA) 60 MG 24 hr capsule 75 mg. Pt reports recently increased dose    . QUEtiapine (SEROQUEL) 200 MG tablet TAKE 1 TABLET(200 MG) BY MOUTH AT BEDTIME 90 tablet 0  . sertraline (ZOLOFT) 100 MG tablet Take 2 tablets (200 mg total) by mouth daily. 120 tablet 3  . varenicline (CHANTIX) 1 MG tablet TAKE 1 TABLET BY MOUTH TWICE DAILY( EVERY TWELVE HOURS) 56 tablet 3  . nitroGLYCERIN (NITROSTAT) 0.4 MG SL tablet Place 1 tablet (0.4 mg total) under the tongue every 5 (five) minutes as needed for chest pain. 25 tablet 11  . rosuvastatin (CRESTOR) 40 MG tablet Take 1 tablet (40 mg total) by mouth daily. 90 tablet 3   No current facility-administered medications for this visit.     Allergies  Allergen Reactions  . Bee Venom Anaphylaxis  . Iodinated Diagnostic Agents Other (See Comments)    Per patient cardiac  arrest IVP DYE-CARDIAC ARREST  . Penicillins Anaphylaxis    Has patient had a PCN reaction causing immediate rash, facial/tongue/throat swelling, SOB or lightheadedness with hypotension: yes Has patient had a PCN reaction causing severe rash involving mucus membranes or skin necrosis: no Has  patient had a PCN reaction that required hospitalization: yes Has patient had a PCN reaction occurring within the last 10 years: no If all of the above answers are "NO", then may proceed with Cephalosporin use.   . Povidone Iodine Itching and Rash  . Tomato Itching  . Chocolate Rash  . Latex Rash  . Other Rash  . Atorvastatin Calcium Nausea Only  . Esomeprazole Magnesium Nausea Only  . Gluten Meal   . Metformin And Related Diarrhea  . Quetiapine Fumarate     Per pt she can not take XR  . Trazodone And Nefazodone Hives  . Zolpidem Tartrate Nausea And Vomiting      Review of Systems:  Constitutional:  No  fever, no chills, No recent illness, No unintentional weight changes. +significant fatigue.   HEENT: No  headache, no vision change  Cardiac: No  chest pain, No  pressure  Respiratory:  No  shortness of breath. No  Cough  Gastrointestinal: No  abdominal pain, No  nausea  Musculoskeletal: +myalgia/arthralgia  Skin: No  Rash, No other wounds/concerning lesions, +bruise as per HPI  Neurologic: +chronic RLE weakness, +dizzines/orthostatic, no vertigo   Exam:  BP 128/76 (BP Location: Right Arm, Patient Position: Sitting, Cuff Size: Normal)   Pulse 91   Temp 98.2 F (36.8 C) (Oral)   Wt 217 lb 1.3 oz (98.5 kg)   LMP 09/21/1974   SpO2 95%   BMI 38.45 kg/m   Constitutional: VS see above. General Appearance: alert, well-developed, well-nourished, NAD  Eyes: Normal lids and conjunctive, non-icteric sclera  Neck: No masses, trachea midline.   Respiratory: Normal respiratory effort. no wheeze, no rhonchi, no rales  Cardiovascular: S1/S2 normal, no murmur  Musculoskeletal:  Strength about 4 out of 5 bilateral lower extremities though noticeably reduced on the left side especially to hip flexion, knee extension, knee flexion against resistance.    Neurological: Normal balance/coordination. No tremor. No cranial nerve deficit on limited exam. Patient also reports tingling pain and sensitivity along anterior shin.  Otherwise sensation  is intact and symmetric. Cerebellar reflexes intact.   Skin: warm, dry, intact.   Psychiatric: Normal judgment/insight. Normal mood and affect. Oriented x3.      ASSESSMENT/PLAN: The primary encounter diagnosis was Sciatica of left side. Diagnoses of Left leg weakness, Lumbar radiculopathy, Pain of back and left lower extremity, and Difficulty walking were also pertinent to this visit.   Orders Placed This Encounter  Procedures  . Ambulatory referral to Pain Clinic      Patient Instructions  Plan:  I would like you to stop taking your losartan, this is a blood pressure medicine.  I think stopping this might help with the dizziness which I think is due to your blood pressure dropping.  My real concern here is the falling which I think is more to do with leg weakness and back pain, possible arthritis compressing the nerves or other deterioration of the nerves.  If you are not too excited about physical therapy or injections, I think at this point we need to determination from a specialist about what other options we have.          Visit summary with medication list and pertinent instructions was printed for patient to review. All questions at time of visit were answered - patient instructed to contact office with any additional concerns or updates. ER/RTC precautions were reviewed with the patient.   Note: Total time spent 40 minutes, greater than 50% of the visit was spent  face-to-face counseling and coordinating care for the above diagnoses listed in assessment/plan.   Please note: voice recognition software was used  to produce this document, and typos may escape review. Please contact Dr. Sheppard Coil for any needed clarifications.     Follow-up plan: Return in about 4 weeks (around 12/07/2018) for A1C / diabetes follow-up and recheck pain (FYI to scheduler, [id number] please) .

## 2018-11-15 ENCOUNTER — Telehealth: Payer: Self-pay

## 2018-11-15 NOTE — Telephone Encounter (Signed)
Tonya from [facility] pain mgmt called stating they are closing out the referral. Office has tried three times to reach out to patient for appt scheduling. Pt has not responded or return call back to pain mgmt office. The office wanted the provider to be aware.

## 2018-11-15 NOTE — Telephone Encounter (Signed)
OK let's see if we can call patient and make her aware of this and give her their number.

## 2018-11-19 ENCOUNTER — Ambulatory Visit: Payer: Self-pay | Admitting: Osteopathic Medicine

## 2018-11-19 ENCOUNTER — Other Ambulatory Visit: Payer: Self-pay | Admitting: Osteopathic Medicine

## 2018-11-19 NOTE — Telephone Encounter (Signed)
Pt has been updated regarding pain mgmt referral. As per pt, she screens her calls. She didn't answer the calls, because she did not know that was the Pain mgmt office contacting her. At pt's request, direct office contact number for [facility] given to pt. No other inquiries.

## 2018-11-20 ENCOUNTER — Ambulatory Visit: Payer: Medicare HMO | Admitting: Osteopathic Medicine

## 2018-11-21 ENCOUNTER — Encounter: Payer: Self-pay | Admitting: Family Medicine

## 2018-11-21 ENCOUNTER — Ambulatory Visit (INDEPENDENT_AMBULATORY_CARE_PROVIDER_SITE_OTHER): Payer: Medicare HMO | Admitting: Family Medicine

## 2018-11-21 VITALS — BP 124/74 | HR 99 | Temp 99.2°F | Wt 213.0 lb

## 2018-11-21 DIAGNOSIS — M5431 Sciatica, right side: Secondary | ICD-10-CM | POA: Diagnosis not present

## 2018-11-21 DIAGNOSIS — T792XXA Traumatic secondary and recurrent hemorrhage and seroma, initial encounter: Secondary | ICD-10-CM

## 2018-11-21 MED ORDER — HYDROCODONE-ACETAMINOPHEN 5-325 MG PO TABS: 1.0000 | ORAL_TABLET | Freq: Four times a day (QID) | ORAL | 0 refills | Status: DC | PRN

## 2018-11-21 MED ORDER — LORAZEPAM 0.5 MG PO TABS: ORAL_TABLET | ORAL | 0 refills | Status: DC

## 2018-11-21 NOTE — Progress Notes (Signed)
Autumn Bowen is a 73 y.o. female who presents to [facility] today for right leg pain.  Patient has had pain radiating down the right posterior leg for about 6 weeks or longer. .  She denies any injury.  She denies severe weakness but does note that her leg sometimes feels like it might give way.  She tripped and fell in early June and was seen in the emergency department on June 5.  She landed on her left arm and had x-rays of her left shoulder and humerus which did not show any fracture.  She notes that she continues to have severe pain radiating down her right leg.  Pain can be very severe at times.  She denies bowel bladder dysfunction.  She denies any new weakness or numbness.  She had MRI of her L-spine about a year ago which did not show obvious compression of right S1 nerve root.  She was seen by PCP on June 12 who thought her symptoms were likely sciatica and she was referred to pain clinic.  She has not been able to establish yet.  She is taking some leftover hydrocodone which does help however that she is trying to minimize the dose of pain medication if possible.  Additionally she notes continued bruising a little soreness of her left upper arm where she fell landed.  She can feel little bump under the skin but somewhat painful.   ROS:  As above  Exam:  BP 124/74   Pulse 99   Temp 99.2 F (37.3 C) (Oral)   Wt 213 lb (96.6 kg)   LMP 09/21/1974   BMI 37.73 kg/m  Wt Readings from Last 5 Encounters:  11/21/18 213 lb (96.6 kg)  11/09/18 217 lb 1.3 oz (98.5 kg)  10/24/18 211 lb (95.7 kg)  10/18/18 210 lb (95.3 kg)  10/04/18 210 lb (95.3 kg)   General: Well Developed, well nourished, and in no acute distress.  Neuro/Psych: Alert and oriented x3, extra-ocular muscles intact, able to move all 4 extremities, sensation grossly intact. Skin: Warm and dry, no rashes noted.  Respiratory: Not using accessory muscles, speaking in full  sentences, trachea midline.  Cardiovascular: Pulses palpable, no extremity edema. Abdomen: Does not appear distended. MSK:  Left shoulder and left upper arm: Resolving bruising present.  Small tender nodule subcutaneous left upper arm.  Normal shoulder motion.  L-spine: Nontender to spinal midline.  Decreased lumbar motion. Positive right-sided slump test. Looks to be strength is intact bilateral extremities. Sensation is intact throughout bilateral lower extremities.  Reflexes intact and equal bilateral lower extremities. Patient walks with an antalgic gait.    Lab and Radiology Results  Limited musculoskeletal ultrasound left upper arm at area of nodule reveals small hypoechoic fluid collection subcutaneous tissue about half a centimeter deep by 2 cm long.  Normal bony structures.  Appearance consistent with seroma.    EXAM: MRI LUMBAR SPINE WITHOUT CONTRAST  TECHNIQUE: Multiplanar, multisequence MR imaging of the lumbar spine was performed. No intravenous contrast was administered.  COMPARISON:  Radiography 11/16/2017  FINDINGS: Segmentation: Transitional L5 vertebra. The T12 ribs are hypoplastic based on rib series 05/16/2009 and lumbar radiography 11/16/2017.  Alignment:  Grade 1 anterolisthesis at L3-4  Vertebrae: No fracture, evidence of discitis, or aggressive bone lesion. T11 body hemangioma.  Conus medullaris and cauda equina: Conus extends to the T12-L1 level. Conus and cauda equina appear normal.  Paraspinal and other soft tissues: Fatty atrophy of intrinsic back  muscles.  Disc levels:  T12- L1: Spondylosis and disc narrowing with asymmetric right facet spurring. Moderate right foraminal narrowing.  L1-L2: Disc narrowing and mild posterior element hypertrophy. No impingement  L2-L3: Disc narrowing with mild bulging. Posterior element hypertrophy. Moderate spinal stenosis. Noncompressive moderate bilateral foraminal narrowing.  L3-L4:  Advanced facet arthropathy with hypertrophy and anterolisthesis. Laminectomy at this level. The canal and foramina are patent  L4-L5: Greatest level of disc narrowing with mild bulging and endplate ridging. Facet spurs. Decompressive laminectomy. Patent canal and foramina  L5-S1:Rudimentary disc space.  No impingement  IMPRESSION: 1. Transitional anatomy with incomplete segmentation of L5. This is based on rib counting with hypoplastic twelfth ribs on a 2010 rib series. 2. Generalized facet degeneration greatest at L2-3 and L3-4. There is related grade 1 anterolisthesis at L3-4. 3. L2-3 moderate spinal stenosis. 4. L3-4 and L4-5 laminectomy with patent canal. 5. Noncompressive foraminal narrowing greatest at T12-L1 on the right   Electronically Signed   By: Monte Fantasia M.D.   On: 12/25/2017 14:25  EXAM: LUMBAR SPINE - COMPLETE 4+ VIEW  COMPARISON:  11/16/2017  FINDINGS: Examination demonstrates normal vertebral body heights. There is mild to moderate spondylosis throughout the lumbar spine to include moderate facet arthropathy over the mid to lower lumbar spine. There is a subtle grade 1 anterolisthesis of L4 on L5 unchanged likely due to the moderate facet arthropathy. Mild disc space narrowing at the L5-S1 level greater than the L4-5 level. Remainder of the exam is unchanged.  IMPRESSION: Mild-to-moderate spondylosis of the lumbar spine with disc disease at the L5-S1 level greater than the L4-5 level. Stable mild grade 1 anterolisthesis of L4 on L5.   Electronically Signed   By: Marin Olp M.D.   On: 07/31/2018 17:59  I personally (independently) visualized and performed the interpretation of the images attached in this note.   Assessment and Plan: 73 y.o. female with  Right leg pain: Likely sciatica.  Patient had x-ray of lumbar spine in March that did not show significant change.  She did have anterolisthesis at L4-L5.  Additionally she had  MRI L-spine July 2019 which did not show obvious S1 nerve root compression on the right side.  Given her worsening symptoms and now starting to have falls I think it is reasonable to proceed with MRI to evaluate this further.  Plan for L-spine MRI and recheck after MRI.  Additionally patient has a small seroma in her left upper arm.  This should resolve on its own without further treatment.  We will continue to follow and if not improving will proceed with aspiration.   PDMP reviewed during this encounter. Orders Placed This Encounter  Procedures  . MR Lumbar Spine Wo Contrast    Standing Status:   Future    Standing Expiration Date:   01/21/2020    Order Specific Question:   What is the patient's sedation requirement?    Answer:   No Sedation    Order Specific Question:   Does the patient have a pacemaker or implanted devices?    Answer:   No    Order Specific Question:   Preferred imaging location?    Answer:   [facility]process development scientist (table limit-350lbs)    Order Specific Question:   Radiology Contrast Protocol - do NOT remove file path    Answer:   [email or web address]   Meds ordered this encounter  Medications  . LORazepam (ATIVAN) 0.5 MG tablet    Sig: 1-2 tabs 30 - 60 min  prior to MRI. Do not drive with this medicine.    Dispense:  4 tablet    Refill:  0  . HYDROcodone-acetaminophen (NORCO/VICODIN) 5-325 MG tablet    Sig: Take 1 tablet by mouth every 6 (six) hours as needed.    Dispense:  15 tablet    Refill:  0    Historical information moved to improve visibility of documentation.  Past Medical History:  Diagnosis Date  . Allergy    Notes that grass and some trees causes eyes to sting, burn, and water.  . Anemia   . Celiac disease   . Celiac disease   . COPD (chronic obstructive pulmonary disease) ([place])   . Diabetes mellitus type II   . Diverticulitis   . Dyslipidemia   . HOH (hard of hearing)   . Hypertension   . Hypothyroidism   . Sleep  apnea   . Sore throat    Past Surgical History:  Procedure Laterality Date  . APPENDECTOMY    . BREAST SURGERY     h/o benign cystleft breast and lymp node removal on right breast area.  Marland Kitchen EYE SURGERY     Left eye cataract removal, right eye h/o macular degeneration.  . INTRAVASCULAR PRESSURE WIRE/FFR STUDY N/A 12/07/2017   Procedure: INTRAVASCULAR PRESSURE WIRE/FFR STUDY;  Surgeon: Leonie Man, MD;  Location: [facility];  Service: Cardiovascular;  Laterality: N/A;  . LEFT HEART CATH AND CORONARY ANGIOGRAPHY N/A 12/07/2017   Procedure: LEFT HEART CATH AND CORONARY ANGIOGRAPHY;  Surgeon: Leonie Man, MD;  Location: [facility];  Service: Cardiovascular;  Laterality: N/A;  . SPINE SURGERY     Pt not sure of the type of surgery she had but knows it was L4-5.  Marland Kitchen VAGINAL HYSTERECTOMY     Social History   Tobacco Use  . Smoking status: Current Some Day Smoker    Packs/day: 0.25    Years: 51.00    Pack years: 12.75    Types: Cigarettes    Start date: 01/02/2012  . Smokeless tobacco: Never Used  . Tobacco comment: Been down to 4 - 6 cigs a day  Substance Use Topics  . Alcohol use: No   family history includes Alcohol abuse in her brother; Anxiety disorder in her mother; Asthma in her son; COPD in her father and sister; Cancer in her sister; Depression in her mother; Diabetes in her father and son; Diabetes type II in her mother; Drug abuse in her brother; Glaucoma in her father; Hashimoto's thyroiditis in her sister; Heart attack in her mother; Hypertension in her father; Kidney disease in her sister; Stroke in her mother.  Medications: Current Outpatient Medications  Medication Sig Dispense Refill  . ACCU-CHEK AVIVA PLUS test strip TEST ONCE TO TWICE DAILY 100 strip 1  . acetaminophen (TYLENOL) 500 MG tablet Take 1,000 mg by mouth every 6 (six) hours as needed for mild pain or moderate pain.    Marland Kitchen albuterol (PROVENTIL HFA;VENTOLIN HFA) 108 (90 Base) MCG/ACT inhaler  Inhale 2 puffs into the lungs every 4 (four) hours as needed for wheezing. 2 Inhaler 11  . AMBULATORY NON FORMULARY MEDICATION Glucometer, test strips and lancets for testing once a day for DM, uncontrolled. 100 strip 0  . AMBULATORY NON FORMULARY MEDICATION Knee-high, medium compression, graduated compression stockings. Apply to lower extremities. Disp per patient preference and insurance coverage. 10 each prn  . AMBULATORY NON FORMULARY MEDICATION Standard walker use as needed. R26.2 M54.9 Disp 1 1  Units prn  . amitriptyline (ELAVIL) 50 MG tablet TAKE 1 TABLET BY MOUTH EVERY NIGHT AT BEDTIME 90 tablet 3  . aspirin EC 81 MG tablet Take 1 tablet (81 mg total) by mouth daily. 90 tablet 3  . BD PEN NEEDLE NANO U/F 32G X 4 MM MISC USE AS DIRECTED 100 each 3  . cholecalciferol (VITAMIN D) 1000 units tablet Take 1,000 Units by mouth daily.    . colchicine 0.6 MG tablet Take 1 tablet (0.6 mg total) by mouth daily. 90 tablet 3  . diclofenac sodium (VOLTAREN) 1 % GEL Apply 4 g topically 4 (four) times daily. To affected joint. 100 g 11  . dicyclomine (BENTYL) 10 MG capsule TAKE ONE CAPSULE BY MOUTH TWICE DAILY 180 capsule 0  . EPINEPHrine (EPIPEN 2-PAK) 0.3 mg/0.3 mL IJ SOAJ injection Inject 0.3 mLs (0.3 mg total) into the muscle as needed for anaphylaxis. 1 Device prn  . famotidine (PEPCID) 20 MG tablet TAKE 1 TABLET(20 MG TOTAL) BY MOUTH DAILY. 90 tablet 3  . Fluticasone-Salmeterol (ADVAIR) 250-50 MCG/DOSE AEPB Inhale 1 puff into the lungs every 12 (twelve) hours. 60 each 11  . gabapentin (NEURONTIN) 300 MG capsule One tab PO qHS for a week, then BID for a week, then TID. May double weekly to a max of 3,651m/day 180 capsule 3  . GLUCOSAMINE-CHONDROITIN PO Take 1 tablet by mouth 2 (two) times daily.     . Insulin Degludec (TRESIBA FLEXTOUCH) 200 UNIT/ML SOPN Inject 28-60 Units into the skin at bedtime. 15 mL 99  . isosorbide mononitrate (IMDUR) 30 MG 24 hr tablet TAKE 1 TABLET(30 MG) BY MOUTH DAILY 90  tablet 1  . JARDIANCE 10 MG TABS tablet TAKE 1 TABLET BY MOUTH DAILY 90 tablet 1  . levocetirizine (XYZAL) 5 MG tablet Take 5 mg by mouth every evening.    .Marland Kitchenlevothyroxine (SYNTHROID, LEVOTHROID) 150 MCG tablet TAKE 1 TABLET BY MOUTH DAILY BEFORE BREAKFAST 90 tablet 3  . LORazepam (ATIVAN) 0.5 MG tablet 1-2 tabs 30 - 60 min prior to MRI. Do not drive with this medicine. 4 tablet 0  . losartan (COZAAR) 25 MG tablet TAKE 1 TABLET(25 MG) BY MOUTH DAILY 30 tablet 5  . LUMIGAN 0.01 % SOLN Instill 1 drop into both eyes every night  3  . meclizine (ANTIVERT) 25 MG tablet     . metFORMIN (GLUCOPHAGE-XR) 750 MG 24 hr tablet TAKE 1 TABLET(750 MG TOTAL) BY MOUTH DAILY WITH BREAKFAST. 90 tablet 1  . Multiple Vitamins-Minerals (PRESERVISION AREDS 2 PO) Take 1 capsule by mouth 2 (two) times daily.    . ondansetron (ZOFRAN) 8 MG tablet TAKE 1 TABLET BY MOUTH EVERY 8 HOURS AS NEEDED FOR NAUSEA AND VOMITING 20 tablet 2  . pregabalin (LYRICA) 75 MG capsule Take 1 capsule (75 mg total) by mouth 3 (three) times daily. 90 capsule 1  . propranolol ER (INDERAL LA) 60 MG 24 hr capsule 75 mg. Pt reports recently increased dose    . QUEtiapine (SEROQUEL) 200 MG tablet TAKE 1 TABLET(200 MG) BY MOUTH AT BEDTIME 90 tablet 0  . sertraline (ZOLOFT) 100 MG tablet Take 2 tablets (200 mg total) by mouth daily. 120 tablet 3  . varenicline (CHANTIX) 1 MG tablet TAKE 1 TABLET BY MOUTH TWICE DAILY( EVERY TWELVE HOURS) 56 tablet 3  . HYDROcodone-acetaminophen (NORCO/VICODIN) 5-325 MG tablet Take 1 tablet by mouth every 6 (six) hours as needed. 15 tablet 0  . nitroGLYCERIN (NITROSTAT) 0.4 MG SL tablet Place 1 tablet (  0.4 mg total) under the tongue every 5 (five) minutes as needed for chest pain. 25 tablet 11  . rosuvastatin (CRESTOR) 40 MG tablet Take 1 tablet (40 mg total) by mouth daily. 90 tablet 3   No current facility-administered medications for this visit.    Allergies  Allergen Reactions  . Bee Venom Anaphylaxis  .  Iodinated Diagnostic Agents Other (See Comments)    Per patient cardiac arrest IVP DYE-CARDIAC ARREST  . Penicillins Anaphylaxis    Has patient had a PCN reaction causing immediate rash, facial/tongue/throat swelling, SOB or lightheadedness with hypotension: yes Has patient had a PCN reaction causing severe rash involving mucus membranes or skin necrosis: no Has patient had a PCN reaction that required hospitalization: yes Has patient had a PCN reaction occurring within the last 10 years: no If all of the above answers are "NO", then may proceed with Cephalosporin use.   . Povidone Iodine Itching and Rash  . Tomato Itching  . Chocolate Rash  . Latex Rash  . Other Rash  . Atorvastatin Calcium Nausea Only  . Esomeprazole Magnesium Nausea Only  . Gluten Meal   . Metformin And Related Diarrhea  . Quetiapine Fumarate     Per pt she can not take XR  . Trazodone And Nefazodone Hives  . Zolpidem Tartrate Nausea And Vomiting      Discussed warning signs or symptoms. Please see discharge instructions. Patient expresses understanding.

## 2018-11-21 NOTE — Patient Instructions (Signed)
Thank you for coming in today. Use pain medicine sparingly.  You should hear about MRI soon.  Use ativan prior to MRI for claustrophobia.   Recheck after MRI to go over results.   Also the bump on your arm is a seroma which is a resolving burise. It should get better in a few weeks.  If it does not get better we can drain it.    Sciatica  Sciatica is pain, numbness, weakness, or tingling along your sciatic nerve. The sciatic nerve starts in the lower back and goes down the back of each leg. Sciatica happens when this nerve is pinched or has pressure put on it. Sciatica usually goes away on its own or with treatment. Sometimes, sciatica may keep coming back (recur). Follow these instructions at home: Medicines  Take over-the-counter and prescription medicines only as told by your doctor.  Do not drive or use heavy machinery while taking prescription pain medicine. Managing pain  If directed, put ice on the affected area. ? Put ice in a plastic bag. ? Place a towel between your skin and the bag. ? Leave the ice on for 20 minutes, 2-3 times a day.  After icing, apply heat to the affected area before you exercise or as often as told by your doctor. Use the heat source that your doctor tells you to use, such as a moist heat pack or a heating pad. ? Place a towel between your skin and the heat source. ? Leave the heat on for 20-30 minutes. ? Remove the heat if your skin turns bright red. This is especially important if you are unable to feel pain, heat, or cold. You may have a greater risk of getting burned. Activity  Return to your normal activities as told by your doctor. Ask your doctor what activities are safe for you. ? Avoid activities that make your sciatica worse.  Take short rests during the day. Rest in a lying or standing position. This is usually better than sitting to rest. ? When you rest for a long time, do some physical activity or stretching between periods of rest. ?  Avoid sitting for a long time without moving. Get up and move around at least one time each hour.  Exercise and stretch regularly, as told by your doctor.  Do not lift anything that is heavier than 10 lb (4.5 kg) while you have symptoms of sciatica. ? Avoid lifting heavy things even when you do not have symptoms. ? Avoid lifting heavy things over and over.  When you lift objects, always lift in a way that is safe for your body. To do this, you should: ? Bend your knees. ? Keep the object close to your body. ? Avoid twisting. General instructions  Use good posture. ? Avoid leaning forward when you are sitting. ? Avoid hunching over when you are standing.  Stay at a healthy weight.  Wear comfortable shoes that support your feet. Avoid wearing high heels.  Avoid sleeping on a mattress that is too soft or too hard. You might have less pain if you sleep on a mattress that is firm enough to support your back.  Keep all follow-up visits as told by your doctor. This is important. Contact a doctor if:  You have pain that: ? Wakes you up when you are sleeping. ? Gets worse when you lie down. ? Is worse than the pain you have had in the past. ? Lasts longer than 4 weeks.  You lose  weight for without trying. Get help right away if:  You cannot control when you pee (urinate) or poop (have a bowel movement).  You have weakness in any of these areas and it gets worse. ? Lower back. ? Lower belly (pelvis). ? Butt (buttocks). ? Legs.  You have redness or swelling of your back.  You have a burning feeling when you pee. This information is not intended to replace advice given to you by your health care provider. Make sure you discuss any questions you have with your health care provider. Document Released: 02/23/2008 Document Revised: 10/22/2015 Document Reviewed: 01/23/2015 Elsevier Interactive Patient Education  2019 [employer]    Seroma A seroma is a collection of fluid on  the body that looks like swelling or a mass. Seromas form where tissue has been injured or cut. Seromas vary in size. Some are small and painless. Others may become large and cause pain or discomfort. Many seromas go away on their own as the fluid is naturally absorbed by the body, and some seromas need to be drained. What are the causes? Seromas form as the result of damage to tissue or the removal of tissue. This tissue damage may occur during surgery or because of an injury or trauma. When tissue is disrupted or removed, empty space is created. The body's natural defense system (immune system) causes fluid to enter the empty space and form a seroma. What are the signs or symptoms? Symptoms of this condition include:  Swelling at the site of a surgical cut (incision) or an injury.  Drainage of clear fluid at the surgery or injury site.  Discomfort or pain. How is this diagnosed? This condition is diagnosed based on your symptoms, your medical history, and a physical exam. During the exam, your health care provider will press on the seroma. You may also have tests, including:  Blood tests.  Imaging tests, such as an ultrasound or CT scan. How is this treated? Some seromas go away (resolve) on their own. Your health care provider may monitor you to make sure the seroma does not cause any complications. If your seroma does not resolve on its own, treatment may include:  Using a needle to drain the fluid from the seroma (needle aspiration).  Inserting a flexible tube (catheter) to drain the fluid.  Applying a bandage (dressing), such as an elastic bandage or binder.  Antibiotic medicines, if the seroma becomes infected. In rare cases, surgery may be done to remove the seroma and repair the area. Follow these instructions at home:   If you were prescribed an antibiotic medicine, take it as told by your health care provider. Do not stop taking the antibiotic even if you start to feel  better.  Return to your normal activities as told by your health care provider. Ask your health care provider what activities are safe for you.  Take over-the-counter and prescription medicines only as told by your health care provider.  Check your seroma every day for signs of infection. Check for: ? Redness or pain. ? Fluid or pus. ? More swelling. ? Warmth.  Keep all follow-up visits as told by your health care provider. This is important. Contact a health care provider if:  You have a fever.  You have redness or pain at the site of the seroma.  You have fluid or pus coming from the seroma.  Your seroma is more swollen or is getting bigger.  Your seroma is warm to the touch. This information  is not intended to replace advice given to you by your health care provider. Make sure you discuss any questions you have with your health care provider. Document Released: 09/10/2012 Document Revised: 02/26/2016 Document Reviewed: 02/26/2016 Elsevier Interactive Patient Education  2019 [employer].

## 2018-11-22 ENCOUNTER — Telehealth: Payer: Self-pay | Admitting: Family Medicine

## 2018-11-22 NOTE — Telephone Encounter (Signed)
Prior authorization obtained for MRI Lumbar spine w/o contrast 72148. Authorization number [id number] good from 11/22/2018 to 12/22/18. Called imaging and given auth. Number. [id number]

## 2018-11-29 DIAGNOSIS — G4730 Sleep apnea, unspecified: Secondary | ICD-10-CM | POA: Diagnosis not present

## 2018-11-29 DIAGNOSIS — R0602 Shortness of breath: Secondary | ICD-10-CM | POA: Diagnosis not present

## 2018-11-29 DIAGNOSIS — T63441A Toxic effect of venom of bees, accidental (unintentional), initial encounter: Secondary | ICD-10-CM | POA: Diagnosis not present

## 2018-11-29 DIAGNOSIS — I10 Essential (primary) hypertension: Secondary | ICD-10-CM | POA: Diagnosis not present

## 2018-11-29 DIAGNOSIS — Z8674 Personal history of sudden cardiac arrest: Secondary | ICD-10-CM | POA: Diagnosis not present

## 2018-11-29 DIAGNOSIS — E1169 Type 2 diabetes mellitus with other specified complication: Secondary | ICD-10-CM | POA: Diagnosis not present

## 2018-11-29 DIAGNOSIS — T7840XA Allergy, unspecified, initial encounter: Secondary | ICD-10-CM | POA: Diagnosis not present

## 2018-11-29 DIAGNOSIS — J439 Emphysema, unspecified: Secondary | ICD-10-CM | POA: Diagnosis not present

## 2018-11-29 DIAGNOSIS — E1165 Type 2 diabetes mellitus with hyperglycemia: Secondary | ICD-10-CM | POA: Diagnosis not present

## 2018-11-29 DIAGNOSIS — E079 Disorder of thyroid, unspecified: Secondary | ICD-10-CM | POA: Diagnosis not present

## 2018-11-29 DIAGNOSIS — E785 Hyperlipidemia, unspecified: Secondary | ICD-10-CM | POA: Diagnosis not present

## 2018-11-29 DIAGNOSIS — Z8673 Personal history of transient ischemic attack (TIA), and cerebral infarction without residual deficits: Secondary | ICD-10-CM | POA: Diagnosis not present

## 2018-12-05 DIAGNOSIS — M25562 Pain in left knee: Secondary | ICD-10-CM | POA: Diagnosis not present

## 2018-12-05 DIAGNOSIS — G8911 Acute pain due to trauma: Secondary | ICD-10-CM | POA: Diagnosis not present

## 2018-12-05 DIAGNOSIS — S0990XA Unspecified injury of head, initial encounter: Secondary | ICD-10-CM | POA: Diagnosis not present

## 2018-12-05 DIAGNOSIS — Z91030 Bee allergy status: Secondary | ICD-10-CM | POA: Diagnosis not present

## 2018-12-05 DIAGNOSIS — M542 Cervicalgia: Secondary | ICD-10-CM | POA: Diagnosis not present

## 2018-12-05 DIAGNOSIS — T63484A Toxic effect of venom of other arthropod, undetermined, initial encounter: Secondary | ICD-10-CM | POA: Diagnosis not present

## 2018-12-05 DIAGNOSIS — R52 Pain, unspecified: Secondary | ICD-10-CM | POA: Diagnosis not present

## 2018-12-05 DIAGNOSIS — Z91040 Latex allergy status: Secondary | ICD-10-CM | POA: Diagnosis not present

## 2018-12-05 DIAGNOSIS — Z8673 Personal history of transient ischemic attack (TIA), and cerebral infarction without residual deficits: Secondary | ICD-10-CM | POA: Diagnosis not present

## 2018-12-05 DIAGNOSIS — R42 Dizziness and giddiness: Secondary | ICD-10-CM | POA: Diagnosis not present

## 2018-12-05 DIAGNOSIS — Z043 Encounter for examination and observation following other accident: Secondary | ICD-10-CM | POA: Diagnosis not present

## 2018-12-05 DIAGNOSIS — E119 Type 2 diabetes mellitus without complications: Secondary | ICD-10-CM | POA: Diagnosis not present

## 2018-12-05 DIAGNOSIS — S0003XA Contusion of scalp, initial encounter: Secondary | ICD-10-CM | POA: Diagnosis not present

## 2018-12-05 DIAGNOSIS — F17210 Nicotine dependence, cigarettes, uncomplicated: Secondary | ICD-10-CM | POA: Diagnosis not present

## 2018-12-05 DIAGNOSIS — S161XXA Strain of muscle, fascia and tendon at neck level, initial encounter: Secondary | ICD-10-CM | POA: Diagnosis not present

## 2018-12-05 DIAGNOSIS — I10 Essential (primary) hypertension: Secondary | ICD-10-CM | POA: Diagnosis not present

## 2018-12-05 DIAGNOSIS — Z91041 Radiographic dye allergy status: Secondary | ICD-10-CM | POA: Diagnosis not present

## 2018-12-07 ENCOUNTER — Other Ambulatory Visit: Payer: Self-pay

## 2018-12-07 ENCOUNTER — Ambulatory Visit (INDEPENDENT_AMBULATORY_CARE_PROVIDER_SITE_OTHER): Payer: Medicare HMO | Admitting: Family Medicine

## 2018-12-07 VITALS — BP 86/56 | HR 91 | Wt 217.0 lb

## 2018-12-07 DIAGNOSIS — I959 Hypotension, unspecified: Secondary | ICD-10-CM | POA: Diagnosis not present

## 2018-12-07 DIAGNOSIS — M47816 Spondylosis without myelopathy or radiculopathy, lumbar region: Secondary | ICD-10-CM | POA: Diagnosis not present

## 2018-12-07 DIAGNOSIS — W19XXXA Unspecified fall, initial encounter: Secondary | ICD-10-CM

## 2018-12-07 DIAGNOSIS — R29898 Other symptoms and signs involving the musculoskeletal system: Secondary | ICD-10-CM | POA: Diagnosis not present

## 2018-12-07 DIAGNOSIS — R296 Repeated falls: Secondary | ICD-10-CM | POA: Diagnosis not present

## 2018-12-07 DIAGNOSIS — R10814 Left lower quadrant abdominal tenderness: Secondary | ICD-10-CM | POA: Diagnosis not present

## 2018-12-07 DIAGNOSIS — M5116 Intervertebral disc disorders with radiculopathy, lumbar region: Secondary | ICD-10-CM | POA: Diagnosis not present

## 2018-12-07 DIAGNOSIS — M4807 Spinal stenosis, lumbosacral region: Secondary | ICD-10-CM | POA: Diagnosis not present

## 2018-12-07 DIAGNOSIS — R270 Ataxia, unspecified: Secondary | ICD-10-CM | POA: Diagnosis not present

## 2018-12-07 DIAGNOSIS — M4726 Other spondylosis with radiculopathy, lumbar region: Secondary | ICD-10-CM | POA: Diagnosis not present

## 2018-12-07 DIAGNOSIS — S0990XA Unspecified injury of head, initial encounter: Secondary | ICD-10-CM | POA: Diagnosis not present

## 2018-12-07 DIAGNOSIS — M4316 Spondylolisthesis, lumbar region: Secondary | ICD-10-CM | POA: Diagnosis not present

## 2018-12-07 DIAGNOSIS — E039 Hypothyroidism, unspecified: Secondary | ICD-10-CM | POA: Diagnosis not present

## 2018-12-07 DIAGNOSIS — R531 Weakness: Secondary | ICD-10-CM | POA: Diagnosis not present

## 2018-12-07 DIAGNOSIS — M5117 Intervertebral disc disorders with radiculopathy, lumbosacral region: Secondary | ICD-10-CM | POA: Diagnosis not present

## 2018-12-07 DIAGNOSIS — E119 Type 2 diabetes mellitus without complications: Secondary | ICD-10-CM | POA: Diagnosis not present

## 2018-12-07 DIAGNOSIS — F3176 Bipolar disorder, in full remission, most recent episode depressed: Secondary | ICD-10-CM | POA: Diagnosis not present

## 2018-12-07 DIAGNOSIS — S0083XA Contusion of other part of head, initial encounter: Secondary | ICD-10-CM | POA: Diagnosis not present

## 2018-12-07 DIAGNOSIS — R262 Difficulty in walking, not elsewhere classified: Secondary | ICD-10-CM | POA: Diagnosis not present

## 2018-12-07 DIAGNOSIS — R42 Dizziness and giddiness: Secondary | ICD-10-CM | POA: Diagnosis not present

## 2018-12-07 DIAGNOSIS — R52 Pain, unspecified: Secondary | ICD-10-CM | POA: Diagnosis not present

## 2018-12-07 DIAGNOSIS — R2681 Unsteadiness on feet: Secondary | ICD-10-CM | POA: Diagnosis not present

## 2018-12-07 DIAGNOSIS — G8911 Acute pain due to trauma: Secondary | ICD-10-CM | POA: Diagnosis not present

## 2018-12-07 DIAGNOSIS — R51 Headache: Secondary | ICD-10-CM | POA: Diagnosis not present

## 2018-12-07 DIAGNOSIS — F319 Bipolar disorder, unspecified: Secondary | ICD-10-CM | POA: Diagnosis not present

## 2018-12-07 NOTE — Patient Instructions (Signed)
Thank you for coming in today. Use a walker every where you stand up.  You should hear about STAT MRI of your back soon. I think we can get you a MRI Sunday.  Follow up with Dr Sheppard Coil on July 14th as scheduled and Dr Lyla Son.  Bring all your pill bottles to your appointment with Dr Sheppard Coil.  Make sure you are not take losartan, isosorbide or propranolol as they can lower blood pressure.  Drink plenty of water.   Be careful about falls.   We will perhaps see each other on the 14th after the MRI.

## 2018-12-07 NOTE — Progress Notes (Signed)
Autumn Bowen is a 73 y.o. female who presents to [facility] [place] today for fall and leg pain and weakness and low blood pressure.  Autumn Bowen has been having frequent falls recently.  She states that her legs give out on her.  She denies feeling as though she passes out.  She recently fell at home and was seen in the emergency department on July 8.  CT scan and x-rays did not show fracture but did show an old cerebral infarct.  She notes that she has significant pain in her right anterior thigh associated with a sensation that her left leg gives out on her when she walks.  She notes this happened suddenly.  If she is not holding onto her walker firmly when this happens she typically will fall.  She notes this is been ongoing for a few weeks now worsening recently.  She denies any new bowel or bladder dysfunction.  She also has been having pain radiating down her right leg.  She has her first appointment with Dr. Francesco Bowen who is a local pain management doctor in August for evaluation and potential epidural steroid injection.  She notes however a history of sudden cardiac arrest or report of possible sudden cardiac arrest with IV contrast previously and notes that she is not been able to have epidural steroid injections with iodine contrast.  She also notes low blood pressure today.  She notes her blood pressures typically much better controlled.  She has losartan and isosorbide and propranolol on her medication list.  She was told by her PCP to stop the losartan and she is pretty sure that she stopped the losartan but she does not even know what the propranolol or isosorbide are.  She is not sure if she is taking them or not.  She notes that she feels a little lightheaded however this is normal for her.  She denies any frank syncope or near syncope.  She feels pretty well today seated.  No chest  pain palpitation shortness of breath.   ROS as above: No fevers chills nausea vomiting diarrhea chest pain palpitation shortness of breath.  Positive for lightheadedness.  Negative for syncope.  No feeling too cold or too hot.  Positive for hard of hearing.  No vision change.  No upper extremity weakness.  No tremor or seizure. Exam:  BP (!) 86/56    Pulse 91    Wt 217 lb (98.4 kg)    LMP 09/21/1974    SpO2 92%    BMI 38.44 kg/m  Wt Readings from Last 5 Encounters:  12/07/18 217 lb (98.4 kg)  11/21/18 213 lb (96.6 kg)  11/09/18 217 lb 1.3 oz (98.5 kg)  10/24/18 211 lb (95.7 kg)  10/18/18 210 lb (95.3 kg)    Gen: Well NAD HEENT: EOMI,  MMM Lungs: Normal work of breathing. CTABL Heart: RRR no MRG Abd: NABS, Soft. Nondistended, Nontender Exts: Brisk capillary refill, warm and well perfused.  MSK: Nontender to spinal midline.  Decreased lumbar motion. Strength: Hip flexion: 5/5 right, 4/5 left. Hip abduction: 5/5 right, 5/5 left Hip adduction: 5/5 right, 5/5 left Knee extension: 5/5 right, 3/5 left intermittently occasionally 5/5.  Patient has sudden onset of weakness and is unable to extend her knee occasionally during prolonged strength testing. Knee flexion: 5/5 right, 5/5 left Foot dorsiflexion, 4+/5 right, 5/5 left.   Foot plantarflexion, 5/5 right, 5/5 left. Reflexes diminished bilateral knees and ankles.  Difficult to appreciate. Sensation is intact throughout.   Lab and Radiology Results CT scan head and neck and chest x-ray reports reviewed from [facility].  Recent radiology including lumbar spine x-ray March images and report reviewed.  Lumbar MRI from July 2019 images and report reviewed.   Assessment and Plan: 73 y.o. female with  Sudden left leg weakness with frequent falls.  Obviously concerning.  Patient has history of significant spinal stenosis.  Obviously concerning.  Patient has significant weakness in her left leg to knee extension.  Her leg will give out suddenly  under exam testing today.  I am very concerned for potential significant nerve compression.  Plan for stat MRI.  Patient elects to go home and be careful.  Expressed that it is very important to use walker at all times when ambulating.  Safety plan discussed.  Severe issue  Hypertension: Obviously concerning issue as well.  Patient appears to be quite well clinically however.  I suspect that she is taking more blood pressure medicine than she thinks she is.  She is a follow-up appoint with her PCP on the 14th.  She will bring all of her medicines with her.  I provided a list of her blood pressure medications and encouraged her to not take this.  I will leave them on the med list however now for review.    PDMP reviewed during this encounter. Orders Placed This Encounter  Procedures   MR Lumbar Spine Wo Contrast    Standing Status:   Future    Standing Expiration Date:   02/07/2020    Order Specific Question:   What is the patient's sedation requirement?    Answer:   Anti-anxiety    Order Specific Question:   Does the patient have a pacemaker or implanted devices?    Answer:   No    Order Specific Question:   Preferred imaging location?    Answer:   Product/process development scientist (table limit-350lbs)    Order Specific Question:   Radiology Contrast Protocol - do NOT remove file path    Answer:   \[email or web address]   No orders of the defined types were placed in this encounter.    Historical information moved to improve visibility of documentation.  Past Medical History:  Diagnosis Date   Allergy    Notes that grass and some trees causes eyes to sting, burn, and water.   Anemia    Celiac disease    Celiac disease    COPD (chronic obstructive pulmonary disease) (HCC)    Diabetes mellitus type II    Diverticulitis    Dyslipidemia    HOH (hard of hearing)    Hypertension    Hypothyroidism    Sleep apnea    Sore throat    Past Surgical History:    Procedure Laterality Date   APPENDECTOMY     BREAST SURGERY     h/o benign cystleft breast and lymp node removal on right breast area.   EYE SURGERY     Left eye cataract removal, right eye h/o macular degeneration.   INTRAVASCULAR PRESSURE WIRE/FFR STUDY N/A 12/07/2017   Procedure: INTRAVASCULAR PRESSURE WIRE/FFR STUDY;  Surgeon: Leonie Man, MD;  Location: [facility] [facility];  Service: Cardiovascular;  Laterality: N/A;   LEFT HEART CATH AND CORONARY ANGIOGRAPHY N/A 12/07/2017   Procedure: LEFT HEART CATH AND CORONARY ANGIOGRAPHY;  Surgeon: Leonie Man, MD;  Location: [facility];  Service: Cardiovascular;  Laterality: N/A;   SPINE SURGERY  Pt not sure of the type of surgery she had but knows it was L4-5.   VAGINAL HYSTERECTOMY     Social History   Tobacco Use   Smoking status: Current Some Day Smoker    Packs/day: 0.25    Years: 51.00    Pack years: 12.75    Types: Cigarettes    Start date: 01/02/2012   Smokeless tobacco: Never Used   Tobacco comment: Been down to 4 - 6 cigs a day  Substance Use Topics   Alcohol use: No   family history includes Alcohol abuse in her brother; Anxiety disorder in her mother; Asthma in her son; COPD in her father and sister; Cancer in her sister; Depression in her mother; Diabetes in her father and son; Diabetes type II in her mother; Drug abuse in her brother; Glaucoma in her father; Hashimoto's thyroiditis in her sister; Heart attack in her mother; Hypertension in her father; Kidney disease in her sister; Stroke in her mother.  Medications: Current Outpatient Medications  Medication Sig Dispense Refill   ACCU-CHEK AVIVA PLUS test strip TEST ONCE TO TWICE DAILY 100 strip 1   acetaminophen (TYLENOL) 500 MG tablet Take 1,000 mg by mouth every 6 (six) hours as needed for mild pain or moderate pain.     albuterol (PROVENTIL HFA;VENTOLIN HFA) 108 (90 Base) MCG/ACT inhaler Inhale 2 puffs into the lungs every 4 (four) hours  as needed for wheezing. 2 Inhaler 11   AMBULATORY NON FORMULARY MEDICATION Glucometer, test strips and lancets for testing once a day for DM, uncontrolled. 100 strip 0   AMBULATORY NON FORMULARY MEDICATION Knee-high, medium compression, graduated compression stockings. Apply to lower extremities. Disp per patient preference and insurance coverage. 10 each prn   AMBULATORY NON FORMULARY MEDICATION Standard walker use as needed. R26.2 M54.9 Disp 1 1 Units prn   amitriptyline (ELAVIL) 50 MG tablet TAKE 1 TABLET BY MOUTH EVERY NIGHT AT BEDTIME 90 tablet 3   aspirin EC 81 MG tablet Take 1 tablet (81 mg total) by mouth daily. 90 tablet 3   BD PEN NEEDLE NANO U/F 32G X 4 MM MISC USE AS DIRECTED 100 each 3   cholecalciferol (VITAMIN D) 1000 units tablet Take 1,000 Units by mouth daily.     colchicine 0.6 MG tablet Take 1 tablet (0.6 mg total) by mouth daily. 90 tablet 3   diclofenac sodium (VOLTAREN) 1 % GEL Apply 4 g topically 4 (four) times daily. To affected joint. 100 g 11   dicyclomine (BENTYL) 10 MG capsule TAKE ONE CAPSULE BY MOUTH TWICE DAILY 180 capsule 0   EPINEPHrine (EPIPEN 2-PAK) 0.3 mg/0.3 mL IJ SOAJ injection Inject 0.3 mLs (0.3 mg total) into the muscle as needed for anaphylaxis. 1 Device prn   famotidine (PEPCID) 20 MG tablet TAKE 1 TABLET(20 MG TOTAL) BY MOUTH DAILY. 90 tablet 3   Fluticasone-Salmeterol (ADVAIR) 250-50 MCG/DOSE AEPB Inhale 1 puff into the lungs every 12 (twelve) hours. 60 each 11   gabapentin (NEURONTIN) 300 MG capsule One tab PO qHS for a week, then BID for a week, then TID. May double weekly to a max of 3,620m/day 180 capsule 3   GLUCOSAMINE-CHONDROITIN PO Take 1 tablet by mouth 2 (two) times daily.      HYDROcodone-acetaminophen (NORCO/VICODIN) 5-325 MG tablet Take 1 tablet by mouth every 6 (six) hours as needed. 15 tablet 0   Insulin Degludec (TRESIBA FLEXTOUCH) 200 UNIT/ML SOPN Inject 28-60 Units into the skin at bedtime. 15 mL 99  isosorbide  mononitrate (IMDUR) 30 MG 24 hr tablet TAKE 1 TABLET(30 MG) BY MOUTH DAILY 90 tablet 1   JARDIANCE 10 MG TABS tablet TAKE 1 TABLET BY MOUTH DAILY 90 tablet 1   levocetirizine (XYZAL) 5 MG tablet Take 5 mg by mouth every evening.     levothyroxine (SYNTHROID, LEVOTHROID) 150 MCG tablet TAKE 1 TABLET BY MOUTH DAILY BEFORE BREAKFAST 90 tablet 3   LORazepam (ATIVAN) 0.5 MG tablet 1-2 tabs 30 - 60 min prior to MRI. Do not drive with this medicine. 4 tablet 0   losartan (COZAAR) 25 MG tablet TAKE 1 TABLET(25 MG) BY MOUTH DAILY 30 tablet 5   LUMIGAN 0.01 % SOLN Instill 1 drop into both eyes every night  3   meclizine (ANTIVERT) 25 MG tablet      metFORMIN (GLUCOPHAGE-XR) 750 MG 24 hr tablet TAKE 1 TABLET(750 MG TOTAL) BY MOUTH DAILY WITH BREAKFAST. 90 tablet 1   Multiple Vitamins-Minerals (PRESERVISION AREDS 2 PO) Take 1 capsule by mouth 2 (two) times daily.     ondansetron (ZOFRAN) 8 MG tablet TAKE 1 TABLET BY MOUTH EVERY 8 HOURS AS NEEDED FOR NAUSEA AND VOMITING 20 tablet 2   pregabalin (LYRICA) 75 MG capsule Take 1 capsule (75 mg total) by mouth 3 (three) times daily. 90 capsule 1   propranolol ER (INDERAL LA) 60 MG 24 hr capsule 75 mg. Pt reports recently increased dose     QUEtiapine (SEROQUEL) 200 MG tablet TAKE 1 TABLET(200 MG) BY MOUTH AT BEDTIME 90 tablet 0   sertraline (ZOLOFT) 100 MG tablet Take 2 tablets (200 mg total) by mouth daily. 120 tablet 3   varenicline (CHANTIX) 1 MG tablet TAKE 1 TABLET BY MOUTH TWICE DAILY( EVERY TWELVE HOURS) 56 tablet 3   nitroGLYCERIN (NITROSTAT) 0.4 MG SL tablet Place 1 tablet (0.4 mg total) under the tongue every 5 (five) minutes as needed for chest pain. 25 tablet 11   rosuvastatin (CRESTOR) 40 MG tablet Take 1 tablet (40 mg total) by mouth daily. 90 tablet 3   No current facility-administered medications for this visit.    Allergies  Allergen Reactions   Bee Venom Anaphylaxis   Iodinated Diagnostic Agents Other (See Comments)    Per  patient cardiac arrest IVP DYE-CARDIAC ARREST   Penicillins Anaphylaxis    Has patient had a PCN reaction causing immediate rash, facial/tongue/throat swelling, SOB or lightheadedness with hypotension: yes Has patient had a PCN reaction causing severe rash involving mucus membranes or skin necrosis: no Has patient had a PCN reaction that required hospitalization: yes Has patient had a PCN reaction occurring within the last 10 years: no If all of the above answers are "NO", then may proceed with Cephalosporin use.    Povidone Iodine Itching and Rash   Tomato Itching   Chocolate Rash   Latex Rash   Other Rash   Atorvastatin Calcium Nausea Only   Esomeprazole Magnesium Nausea Only   Gluten Meal    Metformin And Related Diarrhea   Quetiapine Fumarate     Per pt she can not take XR   Trazodone And Nefazodone Hives   Zolpidem Tartrate Nausea And Vomiting     Discussed warning signs or symptoms. Please see discharge instructions. Patient expresses understanding.

## 2018-12-08 DIAGNOSIS — Z794 Long term (current) use of insulin: Secondary | ICD-10-CM | POA: Diagnosis not present

## 2018-12-08 DIAGNOSIS — M4316 Spondylolisthesis, lumbar region: Secondary | ICD-10-CM | POA: Diagnosis not present

## 2018-12-08 DIAGNOSIS — R2681 Unsteadiness on feet: Secondary | ICD-10-CM | POA: Diagnosis not present

## 2018-12-08 DIAGNOSIS — R296 Repeated falls: Secondary | ICD-10-CM | POA: Diagnosis not present

## 2018-12-08 DIAGNOSIS — E119 Type 2 diabetes mellitus without complications: Secondary | ICD-10-CM | POA: Diagnosis not present

## 2018-12-08 DIAGNOSIS — E039 Hypothyroidism, unspecified: Secondary | ICD-10-CM | POA: Diagnosis not present

## 2018-12-08 DIAGNOSIS — F3176 Bipolar disorder, in full remission, most recent episode depressed: Secondary | ICD-10-CM | POA: Diagnosis not present

## 2018-12-08 DIAGNOSIS — M5117 Intervertebral disc disorders with radiculopathy, lumbosacral region: Secondary | ICD-10-CM | POA: Diagnosis not present

## 2018-12-08 DIAGNOSIS — R531 Weakness: Secondary | ICD-10-CM | POA: Insufficient documentation

## 2018-12-08 DIAGNOSIS — M4726 Other spondylosis with radiculopathy, lumbar region: Secondary | ICD-10-CM | POA: Diagnosis not present

## 2018-12-08 DIAGNOSIS — M5116 Intervertebral disc disorders with radiculopathy, lumbar region: Secondary | ICD-10-CM | POA: Diagnosis not present

## 2018-12-09 DIAGNOSIS — F3176 Bipolar disorder, in full remission, most recent episode depressed: Secondary | ICD-10-CM | POA: Diagnosis not present

## 2018-12-09 DIAGNOSIS — E039 Hypothyroidism, unspecified: Secondary | ICD-10-CM | POA: Diagnosis not present

## 2018-12-09 DIAGNOSIS — R296 Repeated falls: Secondary | ICD-10-CM | POA: Diagnosis not present

## 2018-12-09 DIAGNOSIS — M4726 Other spondylosis with radiculopathy, lumbar region: Secondary | ICD-10-CM | POA: Diagnosis not present

## 2018-12-09 DIAGNOSIS — E119 Type 2 diabetes mellitus without complications: Secondary | ICD-10-CM | POA: Diagnosis not present

## 2018-12-09 DIAGNOSIS — R2681 Unsteadiness on feet: Secondary | ICD-10-CM | POA: Diagnosis not present

## 2018-12-09 DIAGNOSIS — Z794 Long term (current) use of insulin: Secondary | ICD-10-CM | POA: Diagnosis not present

## 2018-12-09 DIAGNOSIS — R531 Weakness: Secondary | ICD-10-CM | POA: Diagnosis not present

## 2018-12-10 MED ORDER — ALUM & MAG HYDROXIDE-SIMETH 200-200-20 MG/5ML PO SUSP
30.00 | ORAL | Status: DC
Start: ? — End: 2018-12-10

## 2018-12-10 MED ORDER — GENERIC EXTERNAL MEDICATION
Status: DC
Start: ? — End: 2018-12-10

## 2018-12-10 MED ORDER — ASPIRIN EC 81 MG PO TBEC
81.00 | DELAYED_RELEASE_TABLET | ORAL | Status: DC
Start: 2018-12-10 — End: 2018-12-10

## 2018-12-10 MED ORDER — BUDESONIDE-FORMOTEROL FUMARATE 80-4.5 MCG/ACT IN AERO
2.00 | INHALATION_SPRAY | RESPIRATORY_TRACT | Status: DC
Start: 2018-12-09 — End: 2018-12-10

## 2018-12-10 MED ORDER — ACETAMINOPHEN 325 MG PO TABS
650.00 | ORAL_TABLET | ORAL | Status: DC
Start: ? — End: 2018-12-10

## 2018-12-10 MED ORDER — SERTRALINE HCL 50 MG PO TABS
50.00 | ORAL_TABLET | ORAL | Status: DC
Start: 2018-12-10 — End: 2018-12-10

## 2018-12-10 MED ORDER — GENERIC EXTERNAL MEDICATION
175.00 | Status: DC
Start: 2018-12-10 — End: 2018-12-10

## 2018-12-10 MED ORDER — SODIUM CHLORIDE 0.9 % IV SOLN
10.00 | INTRAVENOUS | Status: DC
Start: ? — End: 2018-12-10

## 2018-12-10 MED ORDER — QUETIAPINE FUMARATE 25 MG PO TABS
100.00 | ORAL_TABLET | ORAL | Status: DC
Start: 2018-12-09 — End: 2018-12-10

## 2018-12-10 MED ORDER — NITROGLYCERIN 0.4 MG SL SUBL
0.40 | SUBLINGUAL_TABLET | SUBLINGUAL | Status: DC
Start: ? — End: 2018-12-10

## 2018-12-10 MED ORDER — KETOROLAC TROMETHAMINE 15 MG/ML IJ SOLN
15.00 | INTRAMUSCULAR | Status: DC
Start: ? — End: 2018-12-10

## 2018-12-10 MED ORDER — ROSUVASTATIN CALCIUM 40 MG PO TABS
40.00 | ORAL_TABLET | ORAL | Status: DC
Start: 2018-12-09 — End: 2018-12-10

## 2018-12-10 MED ORDER — ENOXAPARIN SODIUM 40 MG/0.4ML ~~LOC~~ SOLN
40.00 | SUBCUTANEOUS | Status: DC
Start: 2018-12-10 — End: 2018-12-10

## 2018-12-10 MED ORDER — SODIUM CHLORIDE 0.9 % IV SOLN
75.00 | INTRAVENOUS | Status: DC
Start: ? — End: 2018-12-10

## 2018-12-10 MED ORDER — PREGABALIN 50 MG PO CAPS
50.00 | ORAL_CAPSULE | ORAL | Status: DC
Start: 2018-12-09 — End: 2018-12-10

## 2018-12-11 ENCOUNTER — Ambulatory Visit: Payer: Medicare HMO | Admitting: Osteopathic Medicine

## 2018-12-12 ENCOUNTER — Ambulatory Visit: Payer: Medicare HMO | Admitting: Osteopathic Medicine

## 2018-12-12 ENCOUNTER — Other Ambulatory Visit: Payer: Self-pay | Admitting: Osteopathic Medicine

## 2018-12-13 ENCOUNTER — Ambulatory Visit (INDEPENDENT_AMBULATORY_CARE_PROVIDER_SITE_OTHER): Payer: Medicare HMO | Admitting: Osteopathic Medicine

## 2018-12-13 ENCOUNTER — Encounter: Payer: Self-pay | Admitting: Osteopathic Medicine

## 2018-12-13 ENCOUNTER — Other Ambulatory Visit: Payer: Self-pay

## 2018-12-13 ENCOUNTER — Telehealth: Payer: Self-pay

## 2018-12-13 VITALS — BP 107/49 | HR 94 | Temp 97.9°F | Wt 215.0 lb

## 2018-12-13 DIAGNOSIS — Z794 Long term (current) use of insulin: Secondary | ICD-10-CM | POA: Diagnosis not present

## 2018-12-13 DIAGNOSIS — R109 Unspecified abdominal pain: Secondary | ICD-10-CM

## 2018-12-13 DIAGNOSIS — E118 Type 2 diabetes mellitus with unspecified complications: Secondary | ICD-10-CM

## 2018-12-13 LAB — POCT GLYCOSYLATED HEMOGLOBIN (HGB A1C): Hemoglobin A1C: 7.9 % — AB (ref 4.0–5.6)

## 2018-12-13 MED ORDER — DICYCLOMINE HCL 10 MG PO CAPS: 10.0000 mg | ORAL_CAPSULE | Freq: Two times a day (BID) | ORAL | 3 refills | Status: DC

## 2018-12-13 MED ORDER — FLUCONAZOLE 150 MG PO TABS: 150.0000 mg | ORAL_TABLET | Freq: Once | ORAL | 1 refills | Status: AC

## 2018-12-13 MED ORDER — BD PEN NEEDLE NANO U/F 32G X 4 MM MISC: 99 refills | Status: DC

## 2018-12-13 MED ORDER — MECLIZINE HCL 25 MG PO TABS: 25.0000 mg | ORAL_TABLET | Freq: Three times a day (TID) | ORAL | 2 refills | Status: DC

## 2018-12-13 NOTE — Telephone Encounter (Signed)
Pt left a vm msg stating she will make her August appt. She is unsure of her appt time and is requesting a call back to confirm. She is scheduled to be seen today. Pls contact pt whether she is keeping or cancelling today's appt. Thanks.

## 2018-12-13 NOTE — Progress Notes (Signed)
HPI: Autumn Bowen is a 73 y.o. female who  has a past medical history of Allergy, Anemia, Celiac disease, Celiac disease, COPD (chronic obstructive pulmonary disease) ([place]), Diabetes mellitus type II, Diverticulitis, Dyslipidemia, HOH (hard of hearing), Hypertension, Hypothyroidism, Sleep apnea, and Sore throat.  she presents to [facility] today, 12/13/18,  for chief complaint of:  ER follow-up  Multiple falls.  Recently seen by my colleague Georgina Snell, sports medicine, 12/07/18. Later that same day, she went to the emergency department.  When she was here to see Dr. Georgina Snell, she reported frequent falls, feels like legs are giving out on her.  No bowel or bladder dysfunction.  Has upcoming appointment with pain management, though she has history of sudden cardiac arrest with administration of IV contrast which might limit her ability to receive injections.  History of significant spinal stenosis and left leg weakness to knee extension, Dr. Georgina Snell arranged for stat MRI but patient elected to go home...? Hypotension improved on recheck at that visit, concern for blood pressure medication accidental overdose.  Patient was advised to follow-up with me and bring all pill bottles  She went to the ER later that day after falling at home and being unable to get up, was admitted, on discharge she was advised to discontinue Tylenol, Jardiance, Pepcid, allergy medication, gentamicin cream, Janumet, oxycodone, tramadol.  She did end up undergoing a lumbar spine MRI while admitted to the hospital, 12/08/2018, which demonstrated L3-4 Marked facet arthropathy causing moderate central canal stenosis minimally increased compared to MRI of June 03, 2010, L4-5 grade 1 anterior listhesis is increased compared to prior  In the office today, patient has not brought her pill bottles.  She reports still feeling unsteady.  She reports dizziness, previously was helped by a short course of Valium,  difficult to really elicit history whether dizziness is purely orthostatic or there is a component of vertigo to it, patient states she feels lightheaded and unsteady anytime she tries to get up and walk, known musculoskeletal/lumbar spinal issues as above.  She also reports vaginal yeast infection today   At today's visit 12/13/18 ... PMH, PSH, FH reviewed and updated as needed.  Current medication list and allergy/intolerance hx reviewed and updated as needed. (See remainder of HPI, ROS, Phys Exam below)   No results found.  Results for orders placed or performed in visit on 12/13/18 (from the past 72 hour(s))  POCT HgB A1C     Status: Abnormal   Collection Time: 12/13/18 11:29 AM  Result Value Ref Range   Hemoglobin A1C 7.9 (A) 4.0 - 5.6 %   HbA1c POC (<> result, manual entry)     HbA1c, POC (prediabetic range)     HbA1c, POC (controlled diabetic range)            ASSESSMENT/PLAN: The primary encounter diagnosis was Type 2 diabetes mellitus with complication, with long-term current use of insulin ([place]). A diagnosis of Abdominal spasms was also pertinent to this visit.   Orders Placed This Encounter  Procedures  . POCT HgB A1C     Meds ordered this encounter  Medications  . meclizine (ANTIVERT) 25 MG tablet    Sig: Take 1 tablet (25 mg total) by mouth 3 (three) times daily.    Dispense:  90 tablet    Refill:  2  . fluconazole (DIFLUCAN) 150 MG tablet    Sig: Take 1 tablet (150 mg total) by mouth once for 1 dose. Repeat dose 72 hours  if yeast infection persists    Dispense:  2 tablet    Refill:  1  . dicyclomine (BENTYL) 10 MG capsule    Sig: Take 1 capsule (10 mg total) by mouth 2 (two) times daily.    Dispense:  180 capsule    Refill:  3  . Insulin Pen Needle (BD PEN NEEDLE NANO U/F) 32G X 4 MM MISC    Sig: USE AS DIRECTED    Dispense:  100 each    Refill:  99    Patient Instructions  Check our medication list against what you have at home, please call  Tanna Furry [phone]. Let's schedule a phone visit next week to go over all medications.      Follow-up plan: Return in about 3 months (around 03/15/2019) for virtual / phone visit next week to go over meds ([place] please) .                                                 ################################################# ################################################# ################################################# #################################################    Current Meds  Medication Sig  . ACCU-CHEK AVIVA PLUS test strip TEST ONCE TO TWICE DAILY  . albuterol (PROVENTIL HFA;VENTOLIN HFA) 108 (90 Base) MCG/ACT inhaler Inhale 2 puffs into the lungs every 4 (four) hours as needed for wheezing.  . AMBULATORY NON FORMULARY MEDICATION Glucometer, test strips and lancets for testing once a day for DM, uncontrolled.  . AMBULATORY NON FORMULARY MEDICATION Knee-high, medium compression, graduated compression stockings. Apply to lower extremities. Disp per patient preference and insurance coverage.  . AMBULATORY NON FORMULARY MEDICATION Standard walker use as needed. R26.2 M54.9 Disp 1  . amitriptyline (ELAVIL) 50 MG tablet TAKE 1 TABLET BY MOUTH EVERY NIGHT AT BEDTIME  . aspirin EC 81 MG tablet Take 1 tablet (81 mg total) by mouth daily.  . cholecalciferol (VITAMIN D) 1000 units tablet Take 1,000 Units by mouth daily.  . colchicine 0.6 MG tablet Take 1 tablet (0.6 mg total) by mouth daily.  . diclofenac sodium (VOLTAREN) 1 % GEL Apply 4 g topically 4 (four) times daily. To affected joint.  Marland Kitchen dicyclomine (BENTYL) 10 MG capsule Take 1 capsule (10 mg total) by mouth 2 (two) times daily.  Marland Kitchen EPINEPHrine (EPIPEN 2-PAK) 0.3 mg/0.3 mL IJ SOAJ injection Inject 0.3 mLs (0.3 mg total) into the muscle as needed for anaphylaxis.  Marland Kitchen Fluticasone-Salmeterol (ADVAIR) 250-50 MCG/DOSE AEPB Inhale 1 puff into the lungs every 12 (twelve) hours.   . gabapentin (NEURONTIN) 300 MG capsule One tab PO qHS for a week, then BID for a week, then TID. May double weekly to a max of 3,643m/day  . GLUCOSAMINE-CHONDROITIN PO Take 1 tablet by mouth 2 (two) times daily.   .Marland KitchenHYDROcodone-acetaminophen (NORCO/VICODIN) 5-325 MG tablet Take 1 tablet by mouth every 6 (six) hours as needed.  . Insulin Degludec (TRESIBA FLEXTOUCH) 200 UNIT/ML SOPN Inject 28-60 Units into the skin at bedtime.  . Insulin Pen Needle (BD PEN NEEDLE NANO U/F) 32G X 4 MM MISC USE AS DIRECTED  . isosorbide mononitrate (IMDUR) 30 MG 24 hr tablet TAKE 1 TABLET(30 MG) BY MOUTH DAILY  . levocetirizine (XYZAL) 5 MG tablet Take 5 mg by mouth every evening.  .Marland Kitchenlevothyroxine (SYNTHROID, LEVOTHROID) 150 MCG tablet TAKE 1 TABLET BY MOUTH DAILY BEFORE BREAKFAST  . LORazepam (ATIVAN) 0.5 MG tablet 1-2 tabs 30 -  60 min prior to MRI. Do not drive with this medicine.  Marland Kitchen losartan (COZAAR) 25 MG tablet TAKE 1 TABLET(25 MG) BY MOUTH DAILY  . LUMIGAN 0.01 % SOLN Instill 1 drop into both eyes every night  . meclizine (ANTIVERT) 25 MG tablet Take 1 tablet (25 mg total) by mouth 3 (three) times daily.  . metFORMIN (GLUCOPHAGE-XR) 750 MG 24 hr tablet TAKE 1 TABLET(750 MG TOTAL) BY MOUTH DAILY WITH BREAKFAST.  . Multiple Vitamins-Minerals (PRESERVISION AREDS 2 PO) Take 1 capsule by mouth 2 (two) times daily.  . ondansetron (ZOFRAN) 8 MG tablet TAKE 1 TABLET BY MOUTH EVERY 8 HOURS AS NEEDED FOR NAUSEA AND VOMITING  . pregabalin (LYRICA) 75 MG capsule Take 1 capsule (75 mg total) by mouth 3 (three) times daily.  . propranolol ER (INDERAL LA) 60 MG 24 hr capsule 75 mg. Pt reports recently increased dose  . QUEtiapine (SEROQUEL) 200 MG tablet TAKE 1 TABLET(200 MG) BY MOUTH AT BEDTIME  . sertraline (ZOLOFT) 100 MG tablet Take 2 tablets (200 mg total) by mouth daily.  . [DISCONTINUED] BD PEN NEEDLE NANO U/F 32G X 4 MM MISC USE AS DIRECTED  . [DISCONTINUED] dicyclomine (BENTYL) 10 MG capsule TAKE ONE CAPSULE BY  MOUTH TWICE DAILY  . [DISCONTINUED] meclizine (ANTIVERT) 25 MG tablet   . [DISCONTINUED] varenicline (CHANTIX) 1 MG tablet TAKE 1 TABLET BY MOUTH TWICE DAILY( EVERY TWELVE HOURS)    Allergies  Allergen Reactions  . Bee Venom Anaphylaxis  . Iodinated Diagnostic Agents Other (See Comments)    Per patient cardiac arrest IVP DYE-CARDIAC ARREST  . Penicillins Anaphylaxis    Has patient had a PCN reaction causing immediate rash, facial/tongue/throat swelling, SOB or lightheadedness with hypotension: yes Has patient had a PCN reaction causing severe rash involving mucus membranes or skin necrosis: no Has patient had a PCN reaction that required hospitalization: yes Has patient had a PCN reaction occurring within the last 10 years: no If all of the above answers are "NO", then may proceed with Cephalosporin use.   . Povidone Iodine Itching and Rash  . Tomato Itching  . Chocolate Rash  . Latex Rash  . Other Rash  . Atorvastatin Calcium Nausea Only  . Esomeprazole Magnesium Nausea Only  . Gluten Meal   . Metformin And Related Diarrhea  . Quetiapine Fumarate     Per pt she can not take XR  . Trazodone And Nefazodone Hives  . Zolpidem Tartrate Nausea And Vomiting       Review of Systems:  Constitutional: No recent illness  HEENT: No  headache, no vision change  Cardiac: No  chest pain, No  pressure, No palpitations  Respiratory:  No  shortness of breath. No  Cough  Gastrointestinal: No  abdominal pain  Musculoskeletal: No new myalgia/arthralgia  Skin: No  Rash  Neurologic: +generalized weakness, +Dizziness   Exam:  BP (!) 107/49 (BP Location: Left Arm, Patient Position: Sitting, Cuff Size: Normal)   Pulse 94   Temp 97.9 F (36.6 C) (Oral)   Wt 215 lb (97.5 kg)   LMP 09/21/1974   BMI 38.09 kg/m   Constitutional: VS see above. General Appearance: alert, well-developed, well-nourished, NAD  Eyes: Normal lids and conjunctive, non-icteric sclera  Ears, Nose, Mouth,  Throat: MMM, Normal external inspection ears/nares/mouth/lips/gums.  Neck: No masses, trachea midline.   Respiratory: Normal respiratory effort.   Musculoskeletal: Gait normal.   Neurological: Normal balance/coordination. No tremor.  Skin: warm, dry, intact.   Psychiatric: Normal judgment/insight. Normal  mood and affect. Oriented x3.       Visit summary with medication list and pertinent instructions was printed for patient to review, patient was advised to alert [place] if any updates are needed. All questions at time of visit were answered - patient instructed to contact office with any additional concerns. ER/RTC precautions were reviewed with the patient and understanding verbalized.   Note: Total time spent 40 minutes, greater than 50% of the visit was spent face-to-face counseling and coordinating care for the following: The primary encounter diagnosis was Type 2 diabetes mellitus with complication, with long-term current use of insulin ([place]). A diagnosis of Abdominal spasms was also pertinent to this visit.Marland Kitchen  Please note: voice recognition software was used to produce this document, and typos may escape review. Please contact Dr. Sheppard Coil for any needed clarifications.    Follow up plan: Return in about 3 months (around 03/15/2019) for virtual / phone visit next week to go over meds ([place] please) .

## 2018-12-13 NOTE — Telephone Encounter (Signed)
Confirmed with patient that she is coming to appointment today. No further questions at this time.

## 2018-12-13 NOTE — Patient Instructions (Signed)
Check our medication list against what you have at home, please call Tanna Furry [phone]. Let's schedule a phone visit next week to go over all medications.

## 2018-12-18 ENCOUNTER — Other Ambulatory Visit: Payer: Self-pay | Admitting: Osteopathic Medicine

## 2018-12-18 DIAGNOSIS — Z7982 Long term (current) use of aspirin: Secondary | ICD-10-CM | POA: Diagnosis not present

## 2018-12-18 DIAGNOSIS — I10 Essential (primary) hypertension: Secondary | ICD-10-CM | POA: Diagnosis not present

## 2018-12-18 DIAGNOSIS — M159 Polyosteoarthritis, unspecified: Secondary | ICD-10-CM | POA: Diagnosis not present

## 2018-12-18 DIAGNOSIS — E119 Type 2 diabetes mellitus without complications: Secondary | ICD-10-CM | POA: Diagnosis not present

## 2018-12-18 DIAGNOSIS — E039 Hypothyroidism, unspecified: Secondary | ICD-10-CM | POA: Diagnosis not present

## 2018-12-18 DIAGNOSIS — F3176 Bipolar disorder, in full remission, most recent episode depressed: Secondary | ICD-10-CM | POA: Diagnosis not present

## 2018-12-18 DIAGNOSIS — F17200 Nicotine dependence, unspecified, uncomplicated: Secondary | ICD-10-CM | POA: Diagnosis not present

## 2018-12-18 DIAGNOSIS — Z7984 Long term (current) use of oral hypoglycemic drugs: Secondary | ICD-10-CM | POA: Diagnosis not present

## 2018-12-18 DIAGNOSIS — E559 Vitamin D deficiency, unspecified: Secondary | ICD-10-CM | POA: Diagnosis not present

## 2018-12-20 ENCOUNTER — Telehealth: Payer: Self-pay

## 2018-12-20 DIAGNOSIS — F17200 Nicotine dependence, unspecified, uncomplicated: Secondary | ICD-10-CM | POA: Diagnosis not present

## 2018-12-20 DIAGNOSIS — M159 Polyosteoarthritis, unspecified: Secondary | ICD-10-CM | POA: Diagnosis not present

## 2018-12-20 DIAGNOSIS — E559 Vitamin D deficiency, unspecified: Secondary | ICD-10-CM | POA: Diagnosis not present

## 2018-12-20 DIAGNOSIS — Z7984 Long term (current) use of oral hypoglycemic drugs: Secondary | ICD-10-CM | POA: Diagnosis not present

## 2018-12-20 DIAGNOSIS — F3176 Bipolar disorder, in full remission, most recent episode depressed: Secondary | ICD-10-CM | POA: Diagnosis not present

## 2018-12-20 DIAGNOSIS — Z7982 Long term (current) use of aspirin: Secondary | ICD-10-CM | POA: Diagnosis not present

## 2018-12-20 DIAGNOSIS — E039 Hypothyroidism, unspecified: Secondary | ICD-10-CM | POA: Diagnosis not present

## 2018-12-20 DIAGNOSIS — I10 Essential (primary) hypertension: Secondary | ICD-10-CM | POA: Diagnosis not present

## 2018-12-20 DIAGNOSIS — E119 Type 2 diabetes mellitus without complications: Secondary | ICD-10-CM | POA: Diagnosis not present

## 2018-12-20 NOTE — Telephone Encounter (Signed)
FYI - Nynicka left a vm msg stating that pt was seen today - had c/o pain, 8/10 pain level. Pt is currently not taking pain medication. As per PT Nynicka, vitals were wnl. She was also requesting home physical therapy for pt: 1x:1wk, 2x:2wks, 1x:1wk. Attempted to reach out to [facility], no answer. Left a detailed vm msg to proceed with physical therapy for pt. Direct call back info provided.

## 2018-12-24 ENCOUNTER — Ambulatory Visit: Payer: Medicare HMO | Admitting: Osteopathic Medicine

## 2018-12-25 ENCOUNTER — Other Ambulatory Visit: Payer: Self-pay

## 2018-12-25 ENCOUNTER — Ambulatory Visit (INDEPENDENT_AMBULATORY_CARE_PROVIDER_SITE_OTHER): Payer: Medicare HMO | Admitting: Family Medicine

## 2018-12-25 ENCOUNTER — Encounter: Payer: Self-pay | Admitting: Family Medicine

## 2018-12-25 VITALS — BP 128/71 | HR 100 | Temp 98.1°F | Wt 215.0 lb

## 2018-12-25 DIAGNOSIS — M4807 Spinal stenosis, lumbosacral region: Secondary | ICD-10-CM | POA: Diagnosis not present

## 2018-12-25 DIAGNOSIS — E119 Type 2 diabetes mellitus without complications: Secondary | ICD-10-CM | POA: Diagnosis not present

## 2018-12-25 DIAGNOSIS — F17200 Nicotine dependence, unspecified, uncomplicated: Secondary | ICD-10-CM | POA: Diagnosis not present

## 2018-12-25 DIAGNOSIS — Z7982 Long term (current) use of aspirin: Secondary | ICD-10-CM | POA: Diagnosis not present

## 2018-12-25 DIAGNOSIS — I10 Essential (primary) hypertension: Secondary | ICD-10-CM | POA: Diagnosis not present

## 2018-12-25 DIAGNOSIS — F3176 Bipolar disorder, in full remission, most recent episode depressed: Secondary | ICD-10-CM | POA: Diagnosis not present

## 2018-12-25 DIAGNOSIS — R29898 Other symptoms and signs involving the musculoskeletal system: Secondary | ICD-10-CM

## 2018-12-25 DIAGNOSIS — E039 Hypothyroidism, unspecified: Secondary | ICD-10-CM | POA: Diagnosis not present

## 2018-12-25 DIAGNOSIS — M159 Polyosteoarthritis, unspecified: Secondary | ICD-10-CM | POA: Diagnosis not present

## 2018-12-25 DIAGNOSIS — Z7984 Long term (current) use of oral hypoglycemic drugs: Secondary | ICD-10-CM | POA: Diagnosis not present

## 2018-12-25 DIAGNOSIS — E559 Vitamin D deficiency, unspecified: Secondary | ICD-10-CM | POA: Diagnosis not present

## 2018-12-25 NOTE — Progress Notes (Signed)
Autumn Bowen is a 73 y.o. female who presents to [facility]: [place] today for follow-up leg weakness and leg pain.  Patient has had worsening leg weakness over the last few months.  This is been associate with multiple falls.  She is had work-up and further evaluation.  I last saw her on July 10 and ordered stat MRI.  However before she could get to MRI her symptoms worsened and she was hospitalized.  While hospitalized MRI showed progression of her spinal stenosis with anterior listhesis at L4-5.  She has a history of decompression at L4-5 and L5-S1.  She notes continued problems with falls and leg weakness.  Additionally in the past she has been referred to Dr. Francesco Runner here in [place] for epidural steroid injection.  She has a history of anaphylaxis with contrast and has trouble getting the epidural steroid injections done because of this.  She has yet to get her appointment with Dr. Francesco Runner which is apparently scheduled for next week.   ROS as above:  Exam:  BP 128/71   Pulse 100   Temp 98.1 F (36.7 C) (Oral)   Wt 215 lb (97.5 kg)   LMP 09/21/1974   BMI 38.09 kg/m  Wt Readings from Last 5 Encounters:  12/25/18 215 lb (97.5 kg)  12/13/18 215 lb (97.5 kg)  12/07/18 217 lb (98.4 kg)  11/21/18 213 lb (96.6 kg)  11/09/18 217 lb 1.3 oz (98.5 kg)    Gen: Well NAD HEENT: EOMI,  MMM Lungs: Normal work of breathing. CTABL Heart: RRR no MRG Abd: NABS, Soft. Nondistended, Nontender Exts: Brisk capillary refill, warm and well perfused.  Neuro: Persistent bilateral lower extremity weakness.  Patient relate without severe antalgic gait.  Lab and Radiology Results Mri Lumbar Spine Wo Contrast  Result Date: 12/08/2018 MRI lumbar spine: TECHNIQUE: Sagittal and axial T1 and T2-weighted sequences were performed. Additional sagittal STIR images were performed. INDICATION: Back  pain COMPARISON: MRI June 03, 2010 FINDINGS: # # Vertebral body heights are well maintained. # T12 hemangioma. # Conus terminates at L1-2 without evidence of tethering. # Nerve roots appear normal. # Incidental findings: None. # T11-12: Mild degenerative disc disease. There is a small right paracentral protrusion abutting the thecal sac without significant central canal stenosis. # T12-L1: Mild degenerative disc disease. There is facet arthropathy without central canal stenosis or neural foraminal narrowing. # L1-2: Mild degenerative disc disease. There is facet arthropathy and disc bulge without significant central canal stenosis or neural foraminal narrowing.. # # L2-3: Mild degenerative disc disease. There is facet arthropathy without significant central canal stenosis or neuroforaminal narrowing. # # L3-4: Mild degenerative disc disease. There is marked facet arthropathy causing moderate central canal stenosis minimally increased compared to prior. There is mild bilateral neural foraminal narrowing. # # L4-5: Mild degenerative disc disease. There is 5 mm grade 1 anterior listhesis increased compared to prior. There is laminectomy decompression with well-maintained central canal. Facet arthropathy and disc uncovering is mild left neural foraminal narrowing unchanged. # # L5-S1: Mild degenerative disc disease. Laminectomy decompression with well-maintained central canal. Disc heights but causes mild bilateral neural foraminal narrowing unchanged.   IMPRESSION: 1. L3-4 marked facet arthropathy causes moderate central canal stenosis minimally increased compared to MRI of June 03, 2010. 2. L4-5 grade 1 anterior listhesis is increased compared to prior. 3. Laminectomy decompression at the levels of L4-5 and L5-S1 with well-maintained central canal. Electronically Signed by: Percell Miller  Eugenio Hoes     Assessment and Plan: 73 y.o. female with  Pain and lower extremity weakness.  Patient has spinal stenosis and is  failing conservative management.  She has frequent falls and persistent weakness.  At this point I am not optimistic that her symptoms will improve with conservative management as she is already had trials and failures of physical therapy and is clinically worsening.  I think it is reasonable gently to have a consultation with neurosurgery.  We have avoided this up until now because she is overall on somewhat poor health and may have a difficult time recovering from surgery however I do not see a lot of other options.  Plan to refer to neurosurgery follow-up with Dr. Francesco Runner for potential injection and recheck back with me in 1 month or sooner if needed.  I spent 25 minutes with this patient, greater than 50% was face-to-face time counseling regarding differential diagnosis treatment plan options next steps and backup plan.  PDMP not reviewed this encounter. Orders Placed This Encounter  Procedures  . Ambulatory referral to Neurosurgery    Referral Priority:   Routine    Referral Type:   Surgical    Referral Reason:   Specialty Services Required    Requested Specialty:   Neurosurgery    Number of Visits Requested:   1   No orders of the defined types were placed in this encounter.    Historical information moved to improve visibility of documentation.  Past Medical History:  Diagnosis Date  . Allergy    Notes that grass and some trees causes eyes to sting, burn, and water.  . Anemia   . Celiac disease   . Celiac disease   . COPD (chronic obstructive pulmonary disease) ([place])   . Diabetes mellitus type II   . Diverticulitis   . Dyslipidemia   . HOH (hard of hearing)   . Hypertension   . Hypothyroidism   . Sleep apnea   . Sore throat    Past Surgical History:  Procedure Laterality Date  . APPENDECTOMY    . BREAST SURGERY     h/o benign cystleft breast and lymp node removal on right breast area.  Marland Kitchen EYE SURGERY     Left eye cataract removal, right eye h/o macular degeneration.   . INTRAVASCULAR PRESSURE WIRE/FFR STUDY N/A 12/07/2017   Procedure: INTRAVASCULAR PRESSURE WIRE/FFR STUDY;  Surgeon: Leonie Man, MD;  Location: [place] [facility];  Service: Cardiovascular;  Laterality: N/A;  . LEFT HEART CATH AND CORONARY ANGIOGRAPHY N/A 12/07/2017   Procedure: LEFT HEART CATH AND CORONARY ANGIOGRAPHY;  Surgeon: Leonie Man, MD;  Location: [place] [facility];  Service: Cardiovascular;  Laterality: N/A;  . SPINE SURGERY     Pt not sure of the type of surgery she had but knows it was L4-5.  Marland Kitchen VAGINAL HYSTERECTOMY     Social History   Tobacco Use  . Smoking status: Current Some Day Smoker    Packs/day: 0.25    Years: 51.00    Pack years: 12.75    Types: Cigarettes    Start date: 01/02/2012  . Smokeless tobacco: Never Used  . Tobacco comment: Been down to 4 - 6 cigs a day  Substance Use Topics  . Alcohol use: No   family history includes Alcohol abuse in her brother; Anxiety disorder in her mother; Asthma in her son; COPD in her father and sister; Cancer in her sister; Depression in her mother; Diabetes in her  father and son; Diabetes type II in her mother; Drug abuse in her brother; Glaucoma in her father; Hashimoto's thyroiditis in her sister; Heart attack in her mother; Hypertension in her father; Kidney disease in her sister; Stroke in her mother.  Medications: Current Outpatient Medications  Medication Sig Dispense Refill  . ACCU-CHEK AVIVA PLUS test strip TEST ONCE TO TWICE DAILY 150 strip 2  . acetaminophen (TYLENOL) 500 MG tablet Take 1,000 mg by mouth every 6 (six) hours as needed for mild pain or moderate pain.    Marland Kitchen albuterol (PROVENTIL HFA;VENTOLIN HFA) 108 (90 Base) MCG/ACT inhaler Inhale 2 puffs into the lungs every 4 (four) hours as needed for wheezing. 2 Inhaler 11  . AMBULATORY NON FORMULARY MEDICATION Glucometer, test strips and lancets for testing once a day for DM, uncontrolled. 100 strip 0  . AMBULATORY NON FORMULARY MEDICATION Knee-high,  medium compression, graduated compression stockings. Apply to lower extremities. Disp per patient preference and insurance coverage. 10 each prn  . AMBULATORY NON FORMULARY MEDICATION Standard walker use as needed. R26.2 M54.9 Disp 1 1 Units prn  . amitriptyline (ELAVIL) 50 MG tablet TAKE 1 TABLET BY MOUTH EVERY NIGHT AT BEDTIME 90 tablet 3  . aspirin EC 81 MG tablet Take 1 tablet (81 mg total) by mouth daily. 90 tablet 3  . cholecalciferol (VITAMIN D) 1000 units tablet Take 1,000 Units by mouth daily.    . colchicine 0.6 MG tablet Take 1 tablet (0.6 mg total) by mouth daily. 90 tablet 3  . diclofenac sodium (VOLTAREN) 1 % GEL Apply 4 g topically 4 (four) times daily. To affected joint. 100 g 11  . dicyclomine (BENTYL) 10 MG capsule Take 1 capsule (10 mg total) by mouth 2 (two) times daily. 180 capsule 3  . EPINEPHrine (EPIPEN 2-PAK) 0.3 mg/0.3 mL IJ SOAJ injection Inject 0.3 mLs (0.3 mg total) into the muscle as needed for anaphylaxis. 1 Device prn  . famotidine (PEPCID) 20 MG tablet TAKE 1 TABLET(20 MG TOTAL) BY MOUTH DAILY. (Patient not taking: Reported on 12/13/2018) 90 tablet 3  . Fluticasone-Salmeterol (ADVAIR) 250-50 MCG/DOSE AEPB Inhale 1 puff into the lungs every 12 (twelve) hours. 60 each 11  . gabapentin (NEURONTIN) 300 MG capsule One tab PO qHS for a week, then BID for a week, then TID. May double weekly to a max of 3,619m/day 180 capsule 3  . GLUCOSAMINE-CHONDROITIN PO Take 1 tablet by mouth 2 (two) times daily.     .Marland KitchenHYDROcodone-acetaminophen (NORCO/VICODIN) 5-325 MG tablet Take 1 tablet by mouth every 6 (six) hours as needed. 15 tablet 0  . Insulin Degludec (TRESIBA FLEXTOUCH) 200 UNIT/ML SOPN Inject 28-60 Units into the skin at bedtime. 15 mL 99  . Insulin Pen Needle (BD PEN NEEDLE NANO U/F) 32G X 4 MM MISC USE AS DIRECTED 100 each 99  . isosorbide mononitrate (IMDUR) 30 MG 24 hr tablet TAKE 1 TABLET(30 MG) BY MOUTH DAILY 90 tablet 1  . levocetirizine (XYZAL) 5 MG tablet Take 5 mg  by mouth every evening.    .Marland Kitchenlevothyroxine (SYNTHROID, LEVOTHROID) 150 MCG tablet TAKE 1 TABLET BY MOUTH DAILY BEFORE BREAKFAST 90 tablet 3  . LORazepam (ATIVAN) 0.5 MG tablet 1-2 tabs 30 - 60 min prior to MRI. Do not drive with this medicine. 4 tablet 0  . losartan (COZAAR) 25 MG tablet TAKE 1 TABLET(25 MG) BY MOUTH DAILY 30 tablet 5  . LUMIGAN 0.01 % SOLN Instill 1 drop into both eyes every night  3  .  meclizine (ANTIVERT) 25 MG tablet Take 1 tablet (25 mg total) by mouth 3 (three) times daily. 90 tablet 2  . metFORMIN (GLUCOPHAGE-XR) 750 MG 24 hr tablet TAKE 1 TABLET(750 MG TOTAL) BY MOUTH DAILY WITH BREAKFAST. 90 tablet 1  . Multiple Vitamins-Minerals (PRESERVISION AREDS 2 PO) Take 1 capsule by mouth 2 (two) times daily.    . nitroGLYCERIN (NITROSTAT) 0.4 MG SL tablet Place 1 tablet (0.4 mg total) under the tongue every 5 (five) minutes as needed for chest pain. 25 tablet 11  . ondansetron (ZOFRAN) 8 MG tablet TAKE 1 TABLET BY MOUTH EVERY 8 HOURS AS NEEDED FOR NAUSEA AND VOMITING 20 tablet 2  . pregabalin (LYRICA) 75 MG capsule Take 1 capsule (75 mg total) by mouth 3 (three) times daily. 90 capsule 1  . propranolol ER (INDERAL LA) 60 MG 24 hr capsule 75 mg. Pt reports recently increased dose    . QUEtiapine (SEROQUEL) 200 MG tablet TAKE 1 TABLET(200 MG) BY MOUTH AT BEDTIME 90 tablet 0  . rosuvastatin (CRESTOR) 40 MG tablet Take 1 tablet (40 mg total) by mouth daily. 90 tablet 3  . sertraline (ZOLOFT) 100 MG tablet Take 2 tablets (200 mg total) by mouth daily. 120 tablet 3   No current facility-administered medications for this visit.    Allergies  Allergen Reactions  . Bee Venom Anaphylaxis  . Iodinated Diagnostic Agents Other (See Comments)    Per patient cardiac arrest IVP DYE-CARDIAC ARREST  . Penicillins Anaphylaxis    Has patient had a PCN reaction causing immediate rash, facial/tongue/throat swelling, SOB or lightheadedness with hypotension: yes Has patient had a PCN reaction  causing severe rash involving mucus membranes or skin necrosis: no Has patient had a PCN reaction that required hospitalization: yes Has patient had a PCN reaction occurring within the last 10 years: no If all of the above answers are "NO", then may proceed with Cephalosporin use.   . Povidone Iodine Itching and Rash  . Tomato Itching  . Chocolate Rash  . Latex Rash  . Other Rash  . Gluten Meal   . Zolpidem Tartrate Nausea And Vomiting  . Atorvastatin Calcium Nausea Only  . Esomeprazole Magnesium Nausea Only  . Metformin And Related Diarrhea  . Quetiapine Fumarate     Per pt she can not take XR  . Trazodone And Nefazodone Hives     Discussed warning signs or symptoms. Please see discharge instructions. Patient expresses understanding.

## 2018-12-25 NOTE — Patient Instructions (Addendum)
Thank you for coming in today. You should hear about the neurosurgry referral.   Call Dr Francesco Runner to double check your appointment  [facility] at [phone]   Recheck with me in 1 month or sooner if needed to follow up your pain and diabetes.   s Seborrheic Keratosis A seborrheic keratosis is a common, noncancerous (benign) skin growth. These growths are velvety, waxy, rough, tan, brown, or black spots that appear on the skin. These skin growths can be flat or raised, and scaly. What are the causes? The cause of this condition is not known. What increases the risk? You are more likely to develop this condition if you:  Have a family history of seborrheic keratosis.  Are 50 or older.  Are pregnant.  Have had estrogen replacement therapy. What are the signs or symptoms? Symptoms of this condition include growths on the face, chest, shoulders, back, or other areas. These growths:  Are usually painless, but may become irritated and itchy.  Can be yellow, brown, black, or other colors.  Are slightly raised or have a flat surface.  Are sometimes rough or wart-like in texture.  Are often velvety or waxy on the surface.  Are round or oval-shaped.  Often occur in groups, but may occur as a single growth. How is this diagnosed? This condition is diagnosed with a medical history and physical exam.  A sample of the growth may be tested (skin biopsy).  You may need to see a skin specialist (dermatologist). How is this treated? Treatment is not usually needed for this condition, unless the growths are irritated or bleed often.  You may also choose to have the growths removed if you do not like their appearance. ? Most commonly, these growths are treated with a procedure in which liquid nitrogen is applied to "freeze" off the growth (cryosurgery). ? They may also be burned off with electricity (electrocautery) or removed by scraping (curettage). Follow these  instructions at home:  Watch your growth for any changes.  Keep all follow-up visits as told by your health care provider. This is important.  Do not scratch or pick at the growth or growths. This can cause them to become irritated or infected. Contact a health care provider if:  You suddenly have many new growths.  Your growth bleeds, itches, or hurts.  Your growth suddenly becomes larger or changes color. Summary  A seborrheic keratosis is a common, noncancerous (benign) skin growth.  Treatment is not usually needed for this condition, unless the growths are irritated or bleed often.  Watch your growth for any changes.  Contact a health care provider if you suddenly have many new growths or your growth suddenly becomes larger or changes color.  Keep all follow-up visits as told by your health care provider. This is important. This information is not intended to replace advice given to you by your health care provider. Make sure you discuss any questions you have with your health care provider. Document Released: 06/18/2010 Document Revised: 09/28/2017 Document Reviewed: 09/28/2017 Elsevier Patient Education  2020 [employer].

## 2019-01-01 ENCOUNTER — Telehealth: Payer: Self-pay | Admitting: Family Medicine

## 2019-01-01 MED ORDER — NITROGLYCERIN 0.4 MG SL SUBL: 0.4000 mg | SUBLINGUAL_TABLET | SUBLINGUAL | 11 refills | Status: DC | PRN

## 2019-01-01 NOTE — Telephone Encounter (Signed)
Called patient to cancel appt with Sheppard Coil and she requested refill of nitroGLYCERIN (NITROSTAT) 0.4 MG SL tablet.

## 2019-01-01 NOTE — Telephone Encounter (Signed)
Nitroglycerin sent to pharmacy

## 2019-01-01 NOTE — Telephone Encounter (Signed)
Never prescribed by Dr Georgina Snell.

## 2019-01-02 ENCOUNTER — Ambulatory Visit: Payer: Medicare HMO | Admitting: Osteopathic Medicine

## 2019-01-07 DIAGNOSIS — I10 Essential (primary) hypertension: Secondary | ICD-10-CM | POA: Diagnosis not present

## 2019-01-07 DIAGNOSIS — F17200 Nicotine dependence, unspecified, uncomplicated: Secondary | ICD-10-CM | POA: Diagnosis not present

## 2019-01-07 DIAGNOSIS — M159 Polyosteoarthritis, unspecified: Secondary | ICD-10-CM | POA: Diagnosis not present

## 2019-01-07 DIAGNOSIS — Z7982 Long term (current) use of aspirin: Secondary | ICD-10-CM | POA: Diagnosis not present

## 2019-01-07 DIAGNOSIS — E039 Hypothyroidism, unspecified: Secondary | ICD-10-CM | POA: Diagnosis not present

## 2019-01-07 DIAGNOSIS — E119 Type 2 diabetes mellitus without complications: Secondary | ICD-10-CM | POA: Diagnosis not present

## 2019-01-07 DIAGNOSIS — Z7984 Long term (current) use of oral hypoglycemic drugs: Secondary | ICD-10-CM | POA: Diagnosis not present

## 2019-01-07 DIAGNOSIS — F3176 Bipolar disorder, in full remission, most recent episode depressed: Secondary | ICD-10-CM | POA: Diagnosis not present

## 2019-01-07 DIAGNOSIS — E559 Vitamin D deficiency, unspecified: Secondary | ICD-10-CM | POA: Diagnosis not present

## 2019-01-08 ENCOUNTER — Other Ambulatory Visit: Payer: Self-pay | Admitting: Family Medicine

## 2019-01-09 ENCOUNTER — Ambulatory Visit (INDEPENDENT_AMBULATORY_CARE_PROVIDER_SITE_OTHER): Payer: Medicare HMO | Admitting: Family Medicine

## 2019-01-09 ENCOUNTER — Encounter: Payer: Self-pay | Admitting: Family Medicine

## 2019-01-09 DIAGNOSIS — Z794 Long term (current) use of insulin: Secondary | ICD-10-CM

## 2019-01-09 DIAGNOSIS — M4807 Spinal stenosis, lumbosacral region: Secondary | ICD-10-CM | POA: Diagnosis not present

## 2019-01-09 DIAGNOSIS — E118 Type 2 diabetes mellitus with unspecified complications: Secondary | ICD-10-CM | POA: Diagnosis not present

## 2019-01-09 DIAGNOSIS — R29898 Other symptoms and signs involving the musculoskeletal system: Secondary | ICD-10-CM | POA: Diagnosis not present

## 2019-01-09 MED ORDER — TRULICITY 0.75 MG/0.5ML ~~LOC~~ SOAJ: 0.5000 mL | SUBCUTANEOUS | 11 refills | Status: DC

## 2019-01-09 NOTE — Progress Notes (Signed)
Blood sugar  136  Pt states she still has yeast infection.

## 2019-01-09 NOTE — Progress Notes (Signed)
Virtual Visit  I connected with      Autumn Bowen  by a telemedicine application and verified that I am speaking with the correct person using two identifiers.   I discussed the limitations of evaluation and management by telemedicine and the availability of in person appointments. The patient expressed understanding and agreed to proceed.  History of Present Illness: Autumn Bowen is a 73 y.o. female who would like to discuss diabetes and back pain.  Patient has a history of diabetes which is typically well managed with insulin.  In the past she mentions that she has used Byetta which she tolerated pretty well and enjoyed.  She notes that recently her blood sugars been a little high especially after meals.  She feels poorly when her blood sugar gets high more than about 150.  She is interested in restarting medicine like Byetta if possible.  She notes however insurance was not covering it when the last time she checked.  Additionally she continues to have significant difficulties with back and leg pain and weakness.  She has pretty significant spinal stenosis and was referred to neurosurgery at the last visit.  She notes that she can remember she got called or not for appointment and has not heard anything.  Additionally she was referred to Dr. Francesco Runner for potential epidural steroid injection.  She has significant contrast allergy is now is hopeful that he could potentially try an epidural steroid injection without contrast.  However she notes that he is not in network and she cannot see him.    Observations/Objective: LMP 09/21/1974  Wt Readings from Last 5 Encounters:  12/25/18 215 lb (97.5 kg)  12/13/18 215 lb (97.5 kg)  12/07/18 217 lb (98.4 kg)  11/21/18 213 lb (96.6 kg)  11/09/18 217 lb 1.3 oz (98.5 kg)   Exam: Normal Speech.    Lab and Radiology Results Lab Results  Component Value Date   HGBA1C 7.9 (A) 12/13/2018     Assessment and Plan: 73 y.o. female with   Diabetes.  A1c was a bit high when last checked relatively recently.  Reasonable to start GLP-1 class.  Will use Trulicity.  Will check back in about 3 weeks to check compliance and efficacy and side effects.  Spinal stenosis.  Severe failing typical conservative management.  Gave patient phone number to neurosurgery office Dr. Katherine Roan.  She will call and set herself up with an appointment.  However this is also [facility] and I am worried that may not be network for her.  Recommend she contact her insurance company as well.  If she needs a different referral to someone in network happy to provide second referral if needed.  Check back 3 weeks via phone visit.  PDMP not reviewed this encounter. No orders of the defined types were placed in this encounter.  Meds ordered this encounter  Medications  . Dulaglutide (TRULICITY) 6.30 ZS/0.1UX SOPN    Sig: Inject 0.5 mLs into the skin once a week.    Dispense:  4 pen    Refill:  11    Patient will have discount coupon    Follow Up Instructions:    I discussed the assessment and treatment plan with the patient. The patient was provided an opportunity to ask questions and all were answered. The patient agreed with the plan and demonstrated an understanding of the instructions.   The patient was advised to call back or seek an in-person evaluation if the symptoms worsen or  if the condition fails to improve as anticipated.  Time: 25 minutes of intraservice time, with >39 minutes of total time during today's visit.       Historical information moved to improve visibility of documentation.  Past Medical History:  Diagnosis Date  . Allergy    Notes that grass and some trees causes eyes to sting, burn, and water.  . Anemia   . Celiac disease   . Celiac disease   . COPD (chronic obstructive pulmonary disease) ([place])   . Diabetes mellitus type II   . Diverticulitis   . Dyslipidemia   . HOH (hard of hearing)   . Hypertension   .  Hypothyroidism   . Sleep apnea   . Sore throat    Past Surgical History:  Procedure Laterality Date  . APPENDECTOMY    . BREAST SURGERY     h/o benign cystleft breast and lymp node removal on right breast area.  Marland Kitchen EYE SURGERY     Left eye cataract removal, right eye h/o macular degeneration.  . INTRAVASCULAR PRESSURE WIRE/FFR STUDY N/A 12/07/2017   Procedure: INTRAVASCULAR PRESSURE WIRE/FFR STUDY;  Surgeon: Leonie Man, MD;  Location: [place] [facility];  Service: Cardiovascular;  Laterality: N/A;  . LEFT HEART CATH AND CORONARY ANGIOGRAPHY N/A 12/07/2017   Procedure: LEFT HEART CATH AND CORONARY ANGIOGRAPHY;  Surgeon: Leonie Man, MD;  Location: [place] [facility];  Service: Cardiovascular;  Laterality: N/A;  . SPINE SURGERY     Pt not sure of the type of surgery she had but knows it was L4-5.  Marland Kitchen VAGINAL HYSTERECTOMY     Social History   Tobacco Use  . Smoking status: Current Some Day Smoker    Packs/day: 0.25    Years: 51.00    Pack years: 12.75    Types: Cigarettes    Start date: 01/02/2012  . Smokeless tobacco: Never Used  . Tobacco comment: Been down to 4 - 6 cigs a day  Substance Use Topics  . Alcohol use: No   family history includes Alcohol abuse in her brother; Anxiety disorder in her mother; Asthma in her son; COPD in her father and sister; Cancer in her sister; Depression in her mother; Diabetes in her father and son; Diabetes type II in her mother; Drug abuse in her brother; Glaucoma in her father; Hashimoto's thyroiditis in her sister; Heart attack in her mother; Hypertension in her father; Kidney disease in her sister; Stroke in her mother.  Medications: Current Outpatient Medications  Medication Sig Dispense Refill  . ACCU-CHEK AVIVA PLUS test strip TEST ONCE TO TWICE DAILY 150 strip 2  . acetaminophen (TYLENOL) 500 MG tablet Take 1,000 mg by mouth every 6 (six) hours as needed for mild pain or moderate pain.    Marland Kitchen albuterol (PROVENTIL HFA;VENTOLIN HFA) 108  (90 Base) MCG/ACT inhaler Inhale 2 puffs into the lungs every 4 (four) hours as needed for wheezing. 2 Inhaler 11  . AMBULATORY NON FORMULARY MEDICATION Glucometer, test strips and lancets for testing once a day for DM, uncontrolled. 100 strip 0  . AMBULATORY NON FORMULARY MEDICATION Knee-high, medium compression, graduated compression stockings. Apply to lower extremities. Disp per patient preference and insurance coverage. 10 each prn  . AMBULATORY NON FORMULARY MEDICATION Standard walker use as needed. R26.2 M54.9 Disp 1 1 Units prn  . amitriptyline (ELAVIL) 50 MG tablet TAKE 1 TABLET BY MOUTH EVERY NIGHT AT BEDTIME 90 tablet 3  . aspirin EC 81 MG tablet Take 1  tablet (81 mg total) by mouth daily. 90 tablet 3  . cholecalciferol (VITAMIN D) 1000 units tablet Take 1,000 Units by mouth daily.    . colchicine 0.6 MG tablet Take 1 tablet (0.6 mg total) by mouth daily. 90 tablet 3  . diclofenac sodium (VOLTAREN) 1 % GEL Apply 4 g topically 4 (four) times daily. To affected joint. 100 g 11  . dicyclomine (BENTYL) 10 MG capsule Take 1 capsule (10 mg total) by mouth 2 (two) times daily. 180 capsule 3  . EPINEPHrine (EPIPEN 2-PAK) 0.3 mg/0.3 mL IJ SOAJ injection Inject 0.3 mLs (0.3 mg total) into the muscle as needed for anaphylaxis. 1 Device prn  . famotidine (PEPCID) 20 MG tablet TAKE 1 TABLET(20 MG TOTAL) BY MOUTH DAILY. 90 tablet 3  . Fluticasone-Salmeterol (ADVAIR) 250-50 MCG/DOSE AEPB Inhale 1 puff into the lungs every 12 (twelve) hours. 60 each 11  . gabapentin (NEURONTIN) 300 MG capsule TAKE 1 CAPSULE AT BEDTIME X 1 WEEK, 1 TWICE DAILY X 1 WEEK, THEN 1 THREE TIMES DAILY. MAY DOUBLE WEEKLY UP TO 3600MG/DAY AS DIRECTED 180 capsule 3  . GLUCOSAMINE-CHONDROITIN PO Take 1 tablet by mouth 2 (two) times daily.     Marland Kitchen HYDROcodone-acetaminophen (NORCO/VICODIN) 5-325 MG tablet Take 1 tablet by mouth every 6 (six) hours as needed. 15 tablet 0  . Insulin Degludec (TRESIBA FLEXTOUCH) 200 UNIT/ML SOPN Inject  28-60 Units into the skin at bedtime. 15 mL 99  . Insulin Pen Needle (BD PEN NEEDLE NANO U/F) 32G X 4 MM MISC USE AS DIRECTED 100 each 99  . isosorbide mononitrate (IMDUR) 30 MG 24 hr tablet TAKE 1 TABLET(30 MG) BY MOUTH DAILY 90 tablet 1  . levocetirizine (XYZAL) 5 MG tablet Take 5 mg by mouth every evening.    Marland Kitchen levothyroxine (SYNTHROID, LEVOTHROID) 150 MCG tablet TAKE 1 TABLET BY MOUTH DAILY BEFORE BREAKFAST 90 tablet 3  . LORazepam (ATIVAN) 0.5 MG tablet 1-2 tabs 30 - 60 min prior to MRI. Do not drive with this medicine. 4 tablet 0  . losartan (COZAAR) 25 MG tablet TAKE 1 TABLET(25 MG) BY MOUTH DAILY 30 tablet 5  . LUMIGAN 0.01 % SOLN Instill 1 drop into both eyes every night  3  . meclizine (ANTIVERT) 25 MG tablet Take 1 tablet (25 mg total) by mouth 3 (three) times daily. 90 tablet 2  . metFORMIN (GLUCOPHAGE-XR) 750 MG 24 hr tablet TAKE 1 TABLET(750 MG TOTAL) BY MOUTH DAILY WITH BREAKFAST. 90 tablet 1  . Multiple Vitamins-Minerals (PRESERVISION AREDS 2 PO) Take 1 capsule by mouth 2 (two) times daily.    . nitroGLYCERIN (NITROSTAT) 0.4 MG SL tablet Place 1 tablet (0.4 mg total) under the tongue every 5 (five) minutes as needed for chest pain. 25 tablet 11  . ondansetron (ZOFRAN) 8 MG tablet TAKE 1 TABLET BY MOUTH EVERY 8 HOURS AS NEEDED FOR NAUSEA AND VOMITING 20 tablet 2  . pregabalin (LYRICA) 75 MG capsule Take 1 capsule (75 mg total) by mouth 3 (three) times daily. 90 capsule 1  . propranolol ER (INDERAL LA) 60 MG 24 hr capsule 75 mg. Pt reports recently increased dose    . QUEtiapine (SEROQUEL) 200 MG tablet TAKE 1 TABLET(200 MG) BY MOUTH AT BEDTIME 90 tablet 0  . sertraline (ZOLOFT) 100 MG tablet Take 2 tablets (200 mg total) by mouth daily. 120 tablet 3  . Dulaglutide (TRULICITY) 5.28 UX/3.2GM SOPN Inject 0.5 mLs into the skin once a week. 4 pen 11  . rosuvastatin (CRESTOR)  40 MG tablet Take 1 tablet (40 mg total) by mouth daily. 90 tablet 3   No current facility-administered  medications for this visit.    Allergies  Allergen Reactions  . Bee Venom Anaphylaxis  . Iodinated Diagnostic Agents Other (See Comments)    Per patient cardiac arrest IVP DYE-CARDIAC ARREST  . Penicillins Anaphylaxis    Has patient had a PCN reaction causing immediate rash, facial/tongue/throat swelling, SOB or lightheadedness with hypotension: yes Has patient had a PCN reaction causing severe rash involving mucus membranes or skin necrosis: no Has patient had a PCN reaction that required hospitalization: yes Has patient had a PCN reaction occurring within the last 10 years: no If all of the above answers are "NO", then may proceed with Cephalosporin use.   . Povidone Iodine Itching and Rash  . Tomato Itching  . Chocolate Rash  . Latex Rash  . Other Rash  . Gluten Meal   . Zolpidem Tartrate Nausea And Vomiting  . Atorvastatin Calcium Nausea Only  . Esomeprazole Magnesium Nausea Only  . Metformin And Related Diarrhea  . Quetiapine Fumarate     Per pt she can not take XR  . Trazodone And Nefazodone Hives

## 2019-01-10 ENCOUNTER — Telehealth: Payer: Self-pay | Admitting: Family Medicine

## 2019-01-10 NOTE — Telephone Encounter (Signed)
-----   Message from Gregor Hams, MD sent at 01/09/2019 11:12 AM EDT ----- Regarding: recheck phone 3 weeks Recheck in 3 weeks phone visit for back pain and diabetes.

## 2019-01-10 NOTE — Telephone Encounter (Signed)
Appointment has been made for a telephone call. No further questions at this time.

## 2019-01-17 DIAGNOSIS — M19012 Primary osteoarthritis, left shoulder: Secondary | ICD-10-CM | POA: Diagnosis not present

## 2019-01-17 DIAGNOSIS — R0902 Hypoxemia: Secondary | ICD-10-CM | POA: Diagnosis not present

## 2019-01-17 DIAGNOSIS — M25552 Pain in left hip: Secondary | ICD-10-CM | POA: Diagnosis not present

## 2019-01-17 DIAGNOSIS — G8911 Acute pain due to trauma: Secondary | ICD-10-CM | POA: Diagnosis not present

## 2019-01-17 DIAGNOSIS — F17210 Nicotine dependence, cigarettes, uncomplicated: Secondary | ICD-10-CM | POA: Diagnosis not present

## 2019-01-17 DIAGNOSIS — K5730 Diverticulosis of large intestine without perforation or abscess without bleeding: Secondary | ICD-10-CM | POA: Diagnosis not present

## 2019-01-17 DIAGNOSIS — I10 Essential (primary) hypertension: Secondary | ICD-10-CM | POA: Diagnosis not present

## 2019-01-17 DIAGNOSIS — Z043 Encounter for examination and observation following other accident: Secondary | ICD-10-CM | POA: Diagnosis not present

## 2019-01-17 DIAGNOSIS — Z91030 Bee allergy status: Secondary | ICD-10-CM | POA: Diagnosis not present

## 2019-01-17 DIAGNOSIS — M25512 Pain in left shoulder: Secondary | ICD-10-CM | POA: Diagnosis not present

## 2019-01-17 DIAGNOSIS — Z8673 Personal history of transient ischemic attack (TIA), and cerebral infarction without residual deficits: Secondary | ICD-10-CM | POA: Diagnosis not present

## 2019-01-17 DIAGNOSIS — R52 Pain, unspecified: Secondary | ICD-10-CM | POA: Diagnosis not present

## 2019-01-17 DIAGNOSIS — Z880 Allergy status to penicillin: Secondary | ICD-10-CM | POA: Diagnosis not present

## 2019-01-17 DIAGNOSIS — R9082 White matter disease, unspecified: Secondary | ICD-10-CM | POA: Diagnosis not present

## 2019-01-17 DIAGNOSIS — Z91041 Radiographic dye allergy status: Secondary | ICD-10-CM | POA: Diagnosis not present

## 2019-01-17 DIAGNOSIS — W19XXXA Unspecified fall, initial encounter: Secondary | ICD-10-CM | POA: Diagnosis not present

## 2019-01-17 DIAGNOSIS — S7002XA Contusion of left hip, initial encounter: Secondary | ICD-10-CM | POA: Diagnosis not present

## 2019-01-17 DIAGNOSIS — E119 Type 2 diabetes mellitus without complications: Secondary | ICD-10-CM | POA: Diagnosis not present

## 2019-01-17 DIAGNOSIS — Z91040 Latex allergy status: Secondary | ICD-10-CM | POA: Diagnosis not present

## 2019-01-21 ENCOUNTER — Ambulatory Visit (INDEPENDENT_AMBULATORY_CARE_PROVIDER_SITE_OTHER): Payer: Medicare HMO | Admitting: Family Medicine

## 2019-01-21 ENCOUNTER — Encounter: Payer: Self-pay | Admitting: Family Medicine

## 2019-01-21 VITALS — Temp 97.1°F

## 2019-01-21 DIAGNOSIS — E118 Type 2 diabetes mellitus with unspecified complications: Secondary | ICD-10-CM | POA: Diagnosis not present

## 2019-01-21 DIAGNOSIS — W19XXXA Unspecified fall, initial encounter: Secondary | ICD-10-CM

## 2019-01-21 DIAGNOSIS — M4807 Spinal stenosis, lumbosacral region: Secondary | ICD-10-CM | POA: Diagnosis not present

## 2019-01-21 DIAGNOSIS — R29898 Other symptoms and signs involving the musculoskeletal system: Secondary | ICD-10-CM

## 2019-01-21 DIAGNOSIS — Z794 Long term (current) use of insulin: Secondary | ICD-10-CM | POA: Diagnosis not present

## 2019-01-21 NOTE — Progress Notes (Signed)
Virtual Visit  I connected with      Autumn Bowen  by a telemedicine application and verified that I am speaking with the correct person using two identifiers.   I discussed the limitations of evaluation and management by telemedicine and the availability of in person appointments. The patient expressed understanding and agreed to proceed.  History of Present Illness: Autumn Bowen is a 73 y.o. female who would like to discuss recent falls.  Autumn Bowen has significant lower extremity weakness thought to be due to spinal stenosis.  She has had multiple falls recently.  She is had evaluation including lumbar MRI and treatments including pretty extensive home physical therapy.  Unfortunately she is not a good candidate for epidural steroid injection given her severe allergy to IV contrast.  She is been referred to neurosurgery but either missed the appointment or the appointment was changed.  She is not quite sure.  She had a fall recently and was seen in the emergency department.  She was referred to neurology at the ED and has follow-up appointment scheduled on August 31.  She thinks the appointment was scheduled for the last day of September.  Additionally she has diabetes.  Her diabetic regimen was recently changed.  She has both [place] and Trulicity and was not sure if she should be taking both or not.  She wanted to double check before she started taking Trulicity.     Observations/Objective: Temp (!) 97.1 F (36.2 C) (Oral)   LMP 09/21/1974  Wt Readings from Last 5 Encounters:  12/25/18 215 lb (97.5 kg)  12/13/18 215 lb (97.5 kg)  12/07/18 217 lb (98.4 kg)  11/21/18 213 lb (96.6 kg)  11/09/18 217 lb 1.3 oz (98.5 kg)   Exam: Normal Speech.  Nontoxic no acute distress.  Hard of hearing.  Lab and Radiology Results Recent ED visit and radiology notes reviewed  Assessment and Plan: 73 y.o. female with frequent falls due to lower extremity weakness due to spinal stenosis.   Patient at this point has maximized her conservative management and although she has some high surgical risks I think she would benefit from neurosurgical or orthopedic spine surgery consultation.  She has a follow-up appointment scheduled with either neurology or neurosurgery on August 31 is viewable in care everywhere.  Advised patient that her visit is not scheduled for the last day of September like she originally thought.  Provided her with the phone number to call to confirm the time and date of her upcoming appointment.  Additionally reviewed her diabetes regimen.  She should be taking both [place] and Trulicity.  Patient has a scheduled virtual phone or video visit with me next week.  We will follow-up the above appointment with neurology or neurosurgery as well as how she tolerated her first week of her new diabetes regimen at that time.  PDMP not reviewed this encounter. No orders of the defined types were placed in this encounter.  No orders of the defined types were placed in this encounter.   Follow Up Instructions:    I discussed the assessment and treatment plan with the patient. The patient was provided an opportunity to ask questions and all were answered. The patient agreed with the plan and demonstrated an understanding of the instructions.   The patient was advised to call back or seek an in-person evaluation if the symptoms worsen or if the condition fails to improve as anticipated.  Time: 25 minutes of intraservice time, with >39  minutes of total time during today's visit.      Historical information moved to improve visibility of documentation.  Past Medical History:  Diagnosis Date  . Allergy    Notes that grass and some trees causes eyes to sting, burn, and water.  . Anemia   . Celiac disease   . Celiac disease   . COPD (chronic obstructive pulmonary disease) ([place])   . Diabetes mellitus type II   . Diverticulitis   . Dyslipidemia   . HOH (hard of hearing)    . Hypertension   . Hypothyroidism   . Sleep apnea   . Sore throat    Past Surgical History:  Procedure Laterality Date  . APPENDECTOMY    . BREAST SURGERY     h/o benign cystleft breast and lymp node removal on right breast area.  Marland Kitchen EYE SURGERY     Left eye cataract removal, right eye h/o macular degeneration.  . INTRAVASCULAR PRESSURE WIRE/FFR STUDY N/A 12/07/2017   Procedure: INTRAVASCULAR PRESSURE WIRE/FFR STUDY;  Surgeon: Leonie Man, MD;  Location: [place] [facility];  Service: Cardiovascular;  Laterality: N/A;  . LEFT HEART CATH AND CORONARY ANGIOGRAPHY N/A 12/07/2017   Procedure: LEFT HEART CATH AND CORONARY ANGIOGRAPHY;  Surgeon: Leonie Man, MD;  Location: [facility];  Service: Cardiovascular;  Laterality: N/A;  . SPINE SURGERY     Pt not sure of the type of surgery she had but knows it was L4-5.  Marland Kitchen VAGINAL HYSTERECTOMY     Social History   Tobacco Use  . Smoking status: Current Some Day Smoker    Packs/day: 0.25    Years: 51.00    Pack years: 12.75    Types: Cigarettes    Start date: 01/02/2012  . Smokeless tobacco: Never Used  . Tobacco comment: Been down to 4 - 6 cigs a day  Substance Use Topics  . Alcohol use: No   family history includes Alcohol abuse in her brother; Anxiety disorder in her mother; Asthma in her son; COPD in her father and sister; Cancer in her sister; Depression in her mother; Diabetes in her father and son; Diabetes type II in her mother; Drug abuse in her brother; Glaucoma in her father; Hashimoto's thyroiditis in her sister; Heart attack in her mother; Hypertension in her father; Kidney disease in her sister; Stroke in her mother.  Medications: Current Outpatient Medications  Medication Sig Dispense Refill  . ACCU-CHEK AVIVA PLUS test strip TEST ONCE TO TWICE DAILY 150 strip 2  . acetaminophen (TYLENOL) 500 MG tablet Take 1,000 mg by mouth every 6 (six) hours as needed for mild pain or moderate pain.    Marland Kitchen albuterol (PROVENTIL  HFA;VENTOLIN HFA) 108 (90 Base) MCG/ACT inhaler Inhale 2 puffs into the lungs every 4 (four) hours as needed for wheezing. 2 Inhaler 11  . AMBULATORY NON FORMULARY MEDICATION Glucometer, test strips and lancets for testing once a day for DM, uncontrolled. 100 strip 0  . AMBULATORY NON FORMULARY MEDICATION Knee-high, medium compression, graduated compression stockings. Apply to lower extremities. Disp per patient preference and insurance coverage. 10 each prn  . AMBULATORY NON FORMULARY MEDICATION Standard walker use as needed. R26.2 M54.9 Disp 1 1 Units prn  . amitriptyline (ELAVIL) 50 MG tablet TAKE 1 TABLET BY MOUTH EVERY NIGHT AT BEDTIME 90 tablet 3  . aspirin EC 81 MG tablet Take 1 tablet (81 mg total) by mouth daily. 90 tablet 3  . cholecalciferol (VITAMIN D) 1000 units tablet  Take 1,000 Units by mouth daily.    . colchicine 0.6 MG tablet Take 1 tablet (0.6 mg total) by mouth daily. 90 tablet 3  . diclofenac sodium (VOLTAREN) 1 % GEL Apply 4 g topically 4 (four) times daily. To affected joint. 100 g 11  . dicyclomine (BENTYL) 10 MG capsule Take 1 capsule (10 mg total) by mouth 2 (two) times daily. 180 capsule 3  . Dulaglutide (TRULICITY) 1.76 HY/0.7PX SOPN Inject 0.5 mLs into the skin once a week. 4 pen 11  . EPINEPHrine (EPIPEN 2-PAK) 0.3 mg/0.3 mL IJ SOAJ injection Inject 0.3 mLs (0.3 mg total) into the muscle as needed for anaphylaxis. 1 Device prn  . famotidine (PEPCID) 20 MG tablet TAKE 1 TABLET(20 MG TOTAL) BY MOUTH DAILY. 90 tablet 3  . Fluticasone-Salmeterol (ADVAIR) 250-50 MCG/DOSE AEPB Inhale 1 puff into the lungs every 12 (twelve) hours. 60 each 11  . gabapentin (NEURONTIN) 300 MG capsule TAKE 1 CAPSULE AT BEDTIME X 1 WEEK, 1 TWICE DAILY X 1 WEEK, THEN 1 THREE TIMES DAILY. MAY DOUBLE WEEKLY UP TO 3600MG/DAY AS DIRECTED 180 capsule 3  . GLUCOSAMINE-CHONDROITIN PO Take 1 tablet by mouth 2 (two) times daily.     Marland Kitchen HYDROcodone-acetaminophen (NORCO/VICODIN) 5-325 MG tablet Take 1 tablet  by mouth every 6 (six) hours as needed. 15 tablet 0  . Insulin Degludec (TRESIBA FLEXTOUCH) 200 UNIT/ML SOPN Inject 28-60 Units into the skin at bedtime. 15 mL 99  . Insulin Pen Needle (BD PEN NEEDLE NANO U/F) 32G X 4 MM MISC USE AS DIRECTED 100 each 99  . isosorbide mononitrate (IMDUR) 30 MG 24 hr tablet TAKE 1 TABLET(30 MG) BY MOUTH DAILY 90 tablet 1  . levocetirizine (XYZAL) 5 MG tablet Take 5 mg by mouth every evening.    Marland Kitchen levothyroxine (SYNTHROID, LEVOTHROID) 150 MCG tablet TAKE 1 TABLET BY MOUTH DAILY BEFORE BREAKFAST 90 tablet 3  . LORazepam (ATIVAN) 0.5 MG tablet 1-2 tabs 30 - 60 min prior to MRI. Do not drive with this medicine. 4 tablet 0  . losartan (COZAAR) 25 MG tablet TAKE 1 TABLET(25 MG) BY MOUTH DAILY 30 tablet 5  . LUMIGAN 0.01 % SOLN Instill 1 drop into both eyes every night  3  . meclizine (ANTIVERT) 25 MG tablet Take 1 tablet (25 mg total) by mouth 3 (three) times daily. 90 tablet 2  . metFORMIN (GLUCOPHAGE-XR) 750 MG 24 hr tablet TAKE 1 TABLET(750 MG TOTAL) BY MOUTH DAILY WITH BREAKFAST. 90 tablet 1  . Multiple Vitamins-Minerals (PRESERVISION AREDS 2 PO) Take 1 capsule by mouth 2 (two) times daily.    . nitroGLYCERIN (NITROSTAT) 0.4 MG SL tablet Place 1 tablet (0.4 mg total) under the tongue every 5 (five) minutes as needed for chest pain. 25 tablet 11  . ondansetron (ZOFRAN) 8 MG tablet TAKE 1 TABLET BY MOUTH EVERY 8 HOURS AS NEEDED FOR NAUSEA AND VOMITING 20 tablet 2  . pregabalin (LYRICA) 75 MG capsule Take 1 capsule (75 mg total) by mouth 3 (three) times daily. 90 capsule 1  . propranolol ER (INDERAL LA) 60 MG 24 hr capsule 75 mg. Pt reports recently increased dose    . QUEtiapine (SEROQUEL) 200 MG tablet TAKE 1 TABLET(200 MG) BY MOUTH AT BEDTIME 90 tablet 0  . rosuvastatin (CRESTOR) 40 MG tablet Take 1 tablet (40 mg total) by mouth daily. 90 tablet 3  . sertraline (ZOLOFT) 100 MG tablet Take 2 tablets (200 mg total) by mouth daily. 120 tablet 3   No  current  facility-administered medications for this visit.    Allergies  Allergen Reactions  . Bee Venom Anaphylaxis  . Iodinated Diagnostic Agents Other (See Comments)    Per patient cardiac arrest IVP DYE-CARDIAC ARREST  . Penicillins Anaphylaxis    Has patient had a PCN reaction causing immediate rash, facial/tongue/throat swelling, SOB or lightheadedness with hypotension: yes Has patient had a PCN reaction causing severe rash involving mucus membranes or skin necrosis: no Has patient had a PCN reaction that required hospitalization: yes Has patient had a PCN reaction occurring within the last 10 years: no If all of the above answers are "NO", then may proceed with Cephalosporin use.   . Povidone Iodine Itching and Rash  . Tomato Itching  . Chocolate Rash  . Latex Rash  . Other Rash  . Gluten Meal   . Zolpidem Tartrate Nausea And Vomiting  . Atorvastatin Calcium Nausea Only  . Esomeprazole Magnesium Nausea Only  . Metformin And Related Diarrhea  . Quetiapine Fumarate     Per pt she can not take XR  . Trazodone And Nefazodone Hives

## 2019-01-22 ENCOUNTER — Encounter: Payer: Self-pay | Admitting: Family Medicine

## 2019-01-28 ENCOUNTER — Other Ambulatory Visit: Payer: Self-pay | Admitting: Osteopathic Medicine

## 2019-01-28 DIAGNOSIS — M47817 Spondylosis without myelopathy or radiculopathy, lumbosacral region: Secondary | ICD-10-CM | POA: Diagnosis not present

## 2019-01-28 DIAGNOSIS — M48062 Spinal stenosis, lumbar region with neurogenic claudication: Secondary | ICD-10-CM | POA: Diagnosis not present

## 2019-01-28 DIAGNOSIS — M419 Scoliosis, unspecified: Secondary | ICD-10-CM | POA: Diagnosis not present

## 2019-01-28 DIAGNOSIS — M47816 Spondylosis without myelopathy or radiculopathy, lumbar region: Secondary | ICD-10-CM | POA: Diagnosis not present

## 2019-01-28 DIAGNOSIS — M5136 Other intervertebral disc degeneration, lumbar region: Secondary | ICD-10-CM | POA: Diagnosis not present

## 2019-01-28 DIAGNOSIS — M4316 Spondylolisthesis, lumbar region: Secondary | ICD-10-CM | POA: Diagnosis not present

## 2019-01-28 DIAGNOSIS — M5416 Radiculopathy, lumbar region: Secondary | ICD-10-CM | POA: Diagnosis not present

## 2019-01-29 ENCOUNTER — Ambulatory Visit (INDEPENDENT_AMBULATORY_CARE_PROVIDER_SITE_OTHER): Payer: Self-pay | Admitting: Family Medicine

## 2019-01-29 DIAGNOSIS — Z5329 Procedure and treatment not carried out because of patient's decision for other reasons: Secondary | ICD-10-CM

## 2019-01-29 DIAGNOSIS — Z91199 Patient's noncompliance with other medical treatment and regimen due to unspecified reason: Secondary | ICD-10-CM

## 2019-01-29 NOTE — Progress Notes (Signed)
No show

## 2019-02-02 ENCOUNTER — Other Ambulatory Visit: Payer: Self-pay | Admitting: Osteopathic Medicine

## 2019-02-02 DIAGNOSIS — G629 Polyneuropathy, unspecified: Secondary | ICD-10-CM

## 2019-02-06 ENCOUNTER — Ambulatory Visit: Payer: Medicare HMO | Admitting: Family Medicine

## 2019-02-07 ENCOUNTER — Telehealth: Payer: Self-pay | Admitting: Family Medicine

## 2019-02-07 ENCOUNTER — Other Ambulatory Visit: Payer: Self-pay

## 2019-02-07 ENCOUNTER — Ambulatory Visit (INDEPENDENT_AMBULATORY_CARE_PROVIDER_SITE_OTHER): Payer: Medicare HMO | Admitting: Family Medicine

## 2019-02-07 VITALS — BP 104/47 | HR 97 | Temp 98.5°F | Wt 210.0 lb

## 2019-02-07 DIAGNOSIS — I10 Essential (primary) hypertension: Secondary | ICD-10-CM

## 2019-02-07 DIAGNOSIS — G8929 Other chronic pain: Secondary | ICD-10-CM

## 2019-02-07 DIAGNOSIS — E1169 Type 2 diabetes mellitus with other specified complication: Secondary | ICD-10-CM

## 2019-02-07 DIAGNOSIS — M5442 Lumbago with sciatica, left side: Secondary | ICD-10-CM | POA: Diagnosis not present

## 2019-02-07 DIAGNOSIS — E118 Type 2 diabetes mellitus with unspecified complications: Secondary | ICD-10-CM | POA: Diagnosis not present

## 2019-02-07 DIAGNOSIS — F17200 Nicotine dependence, unspecified, uncomplicated: Secondary | ICD-10-CM

## 2019-02-07 DIAGNOSIS — E785 Hyperlipidemia, unspecified: Secondary | ICD-10-CM | POA: Diagnosis not present

## 2019-02-07 NOTE — Patient Instructions (Addendum)
Thank you for coming in today.  Continue current medicines.  Make sure you know the date of the nerve test with neurology and neurosurgery.  Recheck in 1 month.  Return sooner if needed.   Milas Kocher, [facility]  [address]  [place], [place] [address]  [phone]  [phone] (Fax  Continue home blood pressure and diabetes monitoring.  Try to bring your medicine with you.

## 2019-02-07 NOTE — Progress Notes (Signed)
Autumn Bowen is a 73 y.o. female who presents to [facility] [place] today for back pain/leg weakness, diabetes hypertension and smoking.  Patient has a long history of worsening low back pain with bilateral lower extremity weakness.  She is had extensive evaluation and management for this and is in the process of proceeding with lumbar fusion surgery.  She notes that she has an EMG left to do to confirm level of involved.  She is not sure when that is going to be but is optimistic about having surgery in the near future.  She notes continued low back pain with lower extremity weakness.  She uses a walker to ambulate.  She also has a history of diabetes.  This currently is well managed with insulin and Trulicity as noted below.  Her blood sugars typically fasting are 88-119 with provided blood sugar log.  Additionally she notes her blood pressures typically pretty well controlled as well.  Most the time with her home log her blood pressures are 120s over 70s.  No chest pain palpitation shortness of breath.  Patient is working to quit smoking.  She currently smokes 6 to 8 cigarettes/day and is going to use a patch to quit smoking.    ROS as above:  Exam:  BP (!) 104/47   Pulse 97   Temp 98.5 F (36.9 C) (Oral)   Wt 210 lb (95.3 kg)   LMP 09/21/1974   SpO2 94%   BMI 37.20 kg/m  Wt Readings from Last 5 Encounters:  02/07/19 210 lb (95.3 kg)  12/25/18 215 lb (97.5 kg)  12/13/18 215 lb (97.5 kg)  12/07/18 217 lb (98.4 kg)  11/21/18 213 lb (96.6 kg)    Gen: Well NAD HEENT: EOMI,  MMM Lungs: Normal work of breathing. CTABL Heart: RRR no MRG Abd: NABS, Soft. Nondistended, Nontender Exts: Brisk capillary refill, warm and well perfused.  Lower extremity strength diminished.  Antalgic gait.  Lab and Radiology Results Reports from neurology/neurosurgery reviewed    Assessment and Plan: 73 y.o. female with  Back pain and lower extremity weakness.  Surgery planned for the near future.  Proceed with EMG testing arranged by neurology.  Happy to continue to follow along.  Diabetes: Doing well continue current regimen.  A1c checked at [facility] relatively recently.  Continue current regimen.  Hypertension: Blood pressure controlled continue current regimen.  Recheck 1 month.  I informed patient that I am transitioning to sports medicine only [facility] in [place] starting in November.  Happy to see patient for continued sports medicine needs.  Discussed need for new PCP.  Provided some recommendations.    PDMP not reviewed this encounter. No orders of the defined types were placed in this encounter.  No orders of the defined types were placed in this encounter.    Historical information moved to improve visibility of documentation.  Past Medical History:  Diagnosis Date  . Allergy    Notes that grass and some trees causes eyes to sting, burn, and water.  . Anemia   . Celiac disease   . Celiac disease   . COPD (chronic obstructive pulmonary disease) ([place])   . Diabetes mellitus type II   . Diverticulitis   . Dyslipidemia   . HOH (hard of hearing)   . Hypertension   . Hypothyroidism   . Sleep apnea   . Sore throat    Past Surgical History:  Procedure Laterality Date  .  APPENDECTOMY    . BREAST SURGERY     h/o benign cystleft breast and lymp node removal on right breast area.  Marland Kitchen EYE SURGERY     Left eye cataract removal, right eye h/o macular degeneration.  . INTRAVASCULAR PRESSURE WIRE/FFR STUDY N/A 12/07/2017   Procedure: INTRAVASCULAR PRESSURE WIRE/FFR STUDY;  Surgeon: Leonie Man, MD;  Location: [facility];  Service: Cardiovascular;  Laterality: N/A;  . LEFT HEART CATH AND CORONARY ANGIOGRAPHY N/A 12/07/2017   Procedure: LEFT HEART CATH AND CORONARY ANGIOGRAPHY;  Surgeon: Leonie Man, MD;  Location: [facility];  Service: Cardiovascular;  Laterality: N/A;  . SPINE SURGERY     Pt not sure of the type of surgery she had but knows it was L4-5.  Marland Kitchen VAGINAL HYSTERECTOMY     Social History   Tobacco Use  . Smoking status: Current Some Day Smoker    Packs/day: 0.25    Years: 51.00    Pack years: 12.75    Types: Cigarettes    Start date: 01/02/2012  . Smokeless tobacco: Never Used  . Tobacco comment: Been down to 4 - 6 cigs a day  Substance Use Topics  . Alcohol use: No   family history includes Alcohol abuse in her brother; Anxiety disorder in her mother; Asthma in her son; COPD in her father and sister; Cancer in her sister; Depression in her mother; Diabetes in her father and son; Diabetes type II in her mother; Drug abuse in her brother; Glaucoma in her father; Hashimoto's thyroiditis in her sister; Heart attack in her mother; Hypertension in her father; Kidney disease in her sister; Stroke in her mother.  Medications: Current Outpatient Medications  Medication Sig Dispense Refill  . ACCU-CHEK AVIVA PLUS test strip TEST ONCE TO TWICE DAILY 150 strip 2  . acetaminophen (TYLENOL) 500 MG tablet Take 1,000 mg by mouth every 6 (six) hours as needed for mild pain or moderate pain.    Marland Kitchen albuterol (PROVENTIL HFA;VENTOLIN HFA) 108 (90 Base) MCG/ACT inhaler Inhale 2 puffs into the lungs every 4 (four) hours as needed for wheezing. 2 Inhaler 11  . AMBULATORY NON FORMULARY MEDICATION Glucometer, test strips and lancets for testing once a day for DM, uncontrolled. 100 strip 0  . AMBULATORY NON FORMULARY MEDICATION Knee-high, medium compression, graduated compression stockings. Apply to lower extremities. Disp per patient preference and insurance coverage. 10 each prn  . AMBULATORY NON FORMULARY MEDICATION Standard walker use as needed. R26.2 M54.9 Disp 1 1 Units prn  . amitriptyline (ELAVIL) 50 MG tablet TAKE 1 TABLET BY MOUTH EVERY NIGHT AT BEDTIME 90 tablet 3  . aspirin EC 81 MG tablet Take  1 tablet (81 mg total) by mouth daily. 90 tablet 3  . cholecalciferol (VITAMIN D) 1000 units tablet Take 1,000 Units by mouth daily.    . colchicine 0.6 MG tablet Take 1 tablet (0.6 mg total) by mouth daily. 90 tablet 3  . diclofenac sodium (VOLTAREN) 1 % GEL Apply 4 g topically 4 (four) times daily. To affected joint. 100 g 11  . dicyclomine (BENTYL) 10 MG capsule Take 1 capsule (10 mg total) by mouth 2 (two) times daily. 180 capsule 3  . Dulaglutide (TRULICITY) 8.27 MB/8.6LJ SOPN Inject 0.5 mLs into the skin once a week. 4 pen 11  . EPINEPHrine (EPIPEN 2-PAK) 0.3 mg/0.3 mL IJ SOAJ injection Inject 0.3 mLs (0.3 mg total) into the muscle as needed for anaphylaxis. 1 Device prn  . famotidine (PEPCID)  20 MG tablet TAKE 1 TABLET(20 MG TOTAL) BY MOUTH DAILY. 90 tablet 3  . Fluticasone-Salmeterol (ADVAIR) 250-50 MCG/DOSE AEPB Inhale 1 puff into the lungs every 12 (twelve) hours. 60 each 11  . gabapentin (NEURONTIN) 300 MG capsule TAKE 1 CAPSULE AT BEDTIME X 1 WEEK, 1 TWICE DAILY X 1 WEEK, THEN 1 THREE TIMES DAILY. MAY DOUBLE WEEKLY UP TO 3600MG/DAY AS DIRECTED 180 capsule 3  . GLUCOSAMINE-CHONDROITIN PO Take 1 tablet by mouth 2 (two) times daily.     Marland Kitchen HYDROcodone-acetaminophen (NORCO/VICODIN) 5-325 MG tablet Take 1 tablet by mouth every 6 (six) hours as needed. 15 tablet 0  . Insulin Degludec (TRESIBA FLEXTOUCH) 200 UNIT/ML SOPN Inject 28-60 Units into the skin at bedtime. 15 mL 99  . Insulin Pen Needle (BD PEN NEEDLE NANO U/F) 32G X 4 MM MISC USE AS DIRECTED 100 each 99  . isosorbide mononitrate (IMDUR) 30 MG 24 hr tablet TAKE 1 TABLET(30 MG) BY MOUTH DAILY 90 tablet 1  . levocetirizine (XYZAL) 5 MG tablet Take 5 mg by mouth every evening.    Marland Kitchen levothyroxine (SYNTHROID, LEVOTHROID) 150 MCG tablet TAKE 1 TABLET BY MOUTH DAILY BEFORE BREAKFAST 90 tablet 3  . LORazepam (ATIVAN) 0.5 MG tablet 1-2 tabs 30 - 60 min prior to MRI. Do not drive with this medicine. 4 tablet 0  . losartan (COZAAR) 25 MG tablet  TAKE 1 TABLET(25 MG) BY MOUTH DAILY 30 tablet 5  . LUMIGAN 0.01 % SOLN Instill 1 drop into both eyes every night  3  . meclizine (ANTIVERT) 25 MG tablet Take 1 tablet (25 mg total) by mouth 3 (three) times daily. 90 tablet 2  . metFORMIN (GLUCOPHAGE-XR) 750 MG 24 hr tablet TAKE 1 TABLET(750 MG TOTAL) BY MOUTH DAILY WITH BREAKFAST. 90 tablet 1  . Multiple Vitamins-Minerals (PRESERVISION AREDS 2 PO) Take 1 capsule by mouth 2 (two) times daily.    . nitroGLYCERIN (NITROSTAT) 0.4 MG SL tablet Place 1 tablet (0.4 mg total) under the tongue every 5 (five) minutes as needed for chest pain. 25 tablet 11  . ondansetron (ZOFRAN) 8 MG tablet TAKE 1 TABLET BY MOUTH EVERY 8 HOURS AS NEEDED FOR NAUSEA AND VOMITING 20 tablet 2  . pregabalin (LYRICA) 75 MG capsule TAKE 1 CAPSULE(75 MG) BY MOUTH THREE TIMES DAILY 90 capsule 0  . propranolol ER (INDERAL LA) 60 MG 24 hr capsule 75 mg. Pt reports recently increased dose    . QUEtiapine (SEROQUEL) 200 MG tablet TAKE 1 TABLET(200 MG) BY MOUTH AT BEDTIME 90 tablet 0  . sertraline (ZOLOFT) 100 MG tablet Take 2 tablets (200 mg total) by mouth daily. 120 tablet 3  . rosuvastatin (CRESTOR) 40 MG tablet Take 1 tablet (40 mg total) by mouth daily. 90 tablet 3   No current facility-administered medications for this visit.    Allergies  Allergen Reactions  . Bee Venom Anaphylaxis  . Iodinated Diagnostic Agents Other (See Comments)    Per patient cardiac arrest IVP DYE-CARDIAC ARREST  . Penicillins Anaphylaxis    Has patient had a PCN reaction causing immediate rash, facial/tongue/throat swelling, SOB or lightheadedness with hypotension: yes Has patient had a PCN reaction causing severe rash involving mucus membranes or skin necrosis: no Has patient had a PCN reaction that required hospitalization: yes Has patient had a PCN reaction occurring within the last 10 years: no If all of the above answers are "NO", then may proceed with Cephalosporin use.   . Povidone Iodine  Itching and Rash  .  Tomato Itching  . Chocolate Rash  . Latex Rash  . Other Rash  . Gluten Meal   . Zolpidem Tartrate Nausea And Vomiting  . Atorvastatin Calcium Nausea Only  . Esomeprazole Magnesium Nausea Only  . Metformin And Related Diarrhea  . Quetiapine Fumarate     Per pt she can not take XR  . Trazodone And Nefazodone Hives     Discussed warning signs or symptoms. Please see discharge instructions. Patient expresses understanding.

## 2019-02-07 NOTE — Telephone Encounter (Signed)
Received report from home health physical therapy ([facility]).  Patient discharged from home with physical therapy due to apparently noncompliance with treatment plan.  My interpretation of the report is that is not clear what exactly happened.  I will request clarification.

## 2019-02-08 DIAGNOSIS — I10 Essential (primary) hypertension: Secondary | ICD-10-CM | POA: Insufficient documentation

## 2019-02-14 ENCOUNTER — Other Ambulatory Visit: Payer: Self-pay | Admitting: Osteopathic Medicine

## 2019-02-14 DIAGNOSIS — Z794 Long term (current) use of insulin: Secondary | ICD-10-CM

## 2019-02-14 DIAGNOSIS — E118 Type 2 diabetes mellitus with unspecified complications: Secondary | ICD-10-CM

## 2019-02-14 DIAGNOSIS — E1129 Type 2 diabetes mellitus with other diabetic kidney complication: Secondary | ICD-10-CM

## 2019-02-14 NOTE — Telephone Encounter (Signed)
Forwarding medication refill request to provider for review.

## 2019-02-18 DIAGNOSIS — M545 Low back pain: Secondary | ICD-10-CM | POA: Diagnosis not present

## 2019-02-18 DIAGNOSIS — Z794 Long term (current) use of insulin: Secondary | ICD-10-CM | POA: Diagnosis not present

## 2019-02-18 DIAGNOSIS — S7002XA Contusion of left hip, initial encounter: Secondary | ICD-10-CM | POA: Diagnosis not present

## 2019-02-18 DIAGNOSIS — Z043 Encounter for examination and observation following other accident: Secondary | ICD-10-CM | POA: Diagnosis not present

## 2019-02-18 DIAGNOSIS — Z7982 Long term (current) use of aspirin: Secondary | ICD-10-CM | POA: Diagnosis not present

## 2019-02-18 DIAGNOSIS — R0902 Hypoxemia: Secondary | ICD-10-CM | POA: Diagnosis not present

## 2019-02-18 DIAGNOSIS — S0083XA Contusion of other part of head, initial encounter: Secondary | ICD-10-CM | POA: Diagnosis not present

## 2019-02-18 DIAGNOSIS — I959 Hypotension, unspecified: Secondary | ICD-10-CM | POA: Diagnosis not present

## 2019-02-18 DIAGNOSIS — Z888 Allergy status to other drugs, medicaments and biological substances status: Secondary | ICD-10-CM | POA: Diagnosis not present

## 2019-02-18 DIAGNOSIS — W19XXXA Unspecified fall, initial encounter: Secondary | ICD-10-CM | POA: Diagnosis not present

## 2019-02-18 DIAGNOSIS — Z880 Allergy status to penicillin: Secondary | ICD-10-CM | POA: Diagnosis not present

## 2019-02-18 DIAGNOSIS — Z91041 Radiographic dye allergy status: Secondary | ICD-10-CM | POA: Diagnosis not present

## 2019-02-18 DIAGNOSIS — S3993XA Unspecified injury of pelvis, initial encounter: Secondary | ICD-10-CM | POA: Diagnosis not present

## 2019-02-18 DIAGNOSIS — R52 Pain, unspecified: Secondary | ICD-10-CM | POA: Diagnosis not present

## 2019-02-18 DIAGNOSIS — Z91040 Latex allergy status: Secondary | ICD-10-CM | POA: Diagnosis not present

## 2019-02-18 DIAGNOSIS — S0990XA Unspecified injury of head, initial encounter: Secondary | ICD-10-CM | POA: Diagnosis not present

## 2019-02-18 DIAGNOSIS — G8911 Acute pain due to trauma: Secondary | ICD-10-CM | POA: Diagnosis not present

## 2019-02-18 DIAGNOSIS — Z79899 Other long term (current) drug therapy: Secondary | ICD-10-CM | POA: Diagnosis not present

## 2019-02-26 ENCOUNTER — Ambulatory Visit (INDEPENDENT_AMBULATORY_CARE_PROVIDER_SITE_OTHER): Payer: Medicare HMO | Admitting: Family Medicine

## 2019-02-26 ENCOUNTER — Encounter: Payer: Self-pay | Admitting: Family Medicine

## 2019-02-26 VITALS — BP 83/52 | HR 87 | Wt 207.0 lb

## 2019-02-26 DIAGNOSIS — M4807 Spinal stenosis, lumbosacral region: Secondary | ICD-10-CM | POA: Diagnosis not present

## 2019-02-26 DIAGNOSIS — I952 Hypotension due to drugs: Secondary | ICD-10-CM | POA: Diagnosis not present

## 2019-02-26 DIAGNOSIS — W19XXXA Unspecified fall, initial encounter: Secondary | ICD-10-CM

## 2019-02-26 MED ORDER — HYDROCODONE-ACETAMINOPHEN 5-325 MG PO TABS: 1.0000 | ORAL_TABLET | Freq: Four times a day (QID) | ORAL | 0 refills | Status: DC | PRN

## 2019-02-26 NOTE — Patient Instructions (Addendum)
Thank you for coming in today. STOP losartan. This is a blood pressure medicine and is protecting your kidney. However it is making your blood pressure too low.  Continue isosorbide 30 daily. This is similar to nitroglycerine but it is long acting.  If you blood pressure remains too low we may need to stop it as well.   Continue to follow up with your spine doctor soon.   Call back to your spine doctor about your MRI and missed appointment.   Using some sort of sit doughnut or cushion would help.

## 2019-02-26 NOTE — Progress Notes (Signed)
Autumn Bowen is a 73 y.o. female who presents to [name] today for follow-up of recent fall.  Autumn Bowen has a history of recurrent falls attributable to low back pain and lower extremity weakness.  She is currently being evaluated by neurology/neurosurgery for lumbar fusion due to spinal stenosis.  She has had extensive conservative management with little benefit for this. She fell and was seen in emergency department on September 21.  She had extensive evaluation including CT scan of her head and neck as well as labs that were all effectively unchanged or unremarkable.  Since then she has had a few more falls.  She sometimes trips.  Sometimes she feels that her legs give way.  Sometimes she feels lightheaded and dizzy.  She has been keeping a track of her blood sugar and blood pressure.  She notes that sometimes her blood pressure gets quite low less than 185 systolic. She has losartan 25 mg that she takes daily as well as isosorbide mononitrate 30 mg daily.  She uses a pillbox to be sure about her medication dosing.  She is very confident that she is not doubling up on doses of medications.    ROS as above:  Exam:  BP (!) 83/52   Pulse 87   Wt 207 lb (93.9 kg)   LMP 09/21/1974   BMI 36.67 kg/m  Wt Readings from Last 5 Encounters:  02/26/19 207 lb (93.9 kg)  02/07/19 210 lb (95.3 kg)  12/25/18 215 lb (97.5 kg)  12/13/18 215 lb (97.5 kg)  12/07/18 217 lb (98.4 kg)    Gen: Well NAD HEENT: EOMI,  MMM Lungs: Normal work of breathing. CTABL Heart: RRR no MRG Abd: NABS, Soft. Nondistended, Nontender Exts: Brisk capillary refill, warm and well perfused.  L-spine nontender to midline. Lower extremity strength is decreased.  Lab and Radiology Results  CT Head WO IV Contrast9/21/2020 [facility] Result Impression  IMPRESSION: No acute intracranial or  cervical spine abnormalities.  Electronically Signed by: Wende Bushy  Result Narrative  INDICATION: Head trauma, minor (Age => 64y) fall COMPARISON:  01/17/2019   TECHNIQUE: Noncontrast cervical spine and head CT was performed. Coronal and sagittal reformatted images of the cervical spine were obtained. without IV contrast.  FINDINGS:     CT BRAIN: - No intracranial hemorrhage. -Hypoattenuation the periventricular white matter. - No intracranial mass, mass effect, or midline shift.   - Ventricles: No hydrocephalus. - Calvarium: Intact. - Paranasal sinuses: Visualized sinuses are clear. - Mastoid air cells: Clear.  CT C-SPINE: - No acute fracture or dislocation.  - Vertebral body heights are preserved.  - Cervical spinal alignment is preserved. - No destructive osseous lesions.  -Multilevel degenerative disc disease. Bilateral facet and uncovertebral hypertrophy, right worse than left. Disc space narrowing worse at C6-7. No significant central canal stenosis. - No perivertebral soft tissue abnormalities.  MISC: - N.A.   CT Spine  Cervical WO IV Contrast9/21/2020 [facility] Result Impression  IMPRESSION: No acute intracranial or cervical spine abnormalities.  Electronically Signed by: Wende Bushy  Result Narrative  INDICATION: Head trauma, minor (Age => 64y) fall COMPARISON:  01/17/2019   TECHNIQUE: Noncontrast cervical spine and head CT was performed. Coronal and sagittal reformatted images of the cervical spine were obtained. without IV contrast.  FINDINGS:     CT BRAIN: - No intracranial hemorrhage. -Hypoattenuation the periventricular white matter. - No intracranial mass, mass effect, or midline shift.   -  Ventricles: No hydrocephalus. - Calvarium: Intact. - Paranasal sinuses: Visualized sinuses are clear. - Mastoid air cells: Clear.  CT C-SPINE: - No acute fracture or dislocation.  - Vertebral body heights are preserved.  - Cervical spinal alignment  is preserved. - No destructive osseous lesions.  -Multilevel degenerative disc disease. Bilateral facet and uncovertebral hypertrophy, right worse than left. Disc space narrowing worse at C6-7. No significant central canal stenosis. - No perivertebral soft tissue abnormalities.  MISC: - N.A.       Assessment and Plan: 72 y.o. female with  Falls with hypotension and lower extremity weakness.  Hypertension: Blood pressure low today.  Patient does not have much antihypertensive regimen.  She started on losartan for renal protection with her diabetes.  However it appears that she cannot tolerate much blood pressure control.  Plan to discontinue losartan.  She also receives isosorbide for angina.  She seems to be tolerating that okay and it seems to be helpful.  If blood pressure still remains low in the future will discontinue isosorbide mononitrate.  Leg weakness: Currently being evaluated and managed by neurosurgery.  Ultimately she will benefit from spinal fusion.  Continue to comanage.  Frequent falls: Patient has had extensive work-up and treatment for this previously including home with physical therapy.  She has maximal management outpatient at this time.  Watchful waiting proceed with surgery if able.  Recheck back in 2 weeks.  PDMP reviewed during this encounter. No orders of the defined types were placed in this encounter.  Meds ordered this encounter  Medications  . HYDROcodone-acetaminophen (NORCO/VICODIN) 5-325 MG tablet    Sig: Take 1 tablet by mouth every 6 (six) hours as needed.    Dispense:  15 tablet    Refill:  0     Historical information moved to improve visibility of documentation.  Past Medical History:  Diagnosis Date  . Allergy    Notes that grass and some trees causes eyes to sting, burn, and water.  . Anemia   . Celiac disease   . Celiac disease   . COPD (chronic obstructive pulmonary disease) ([place])   . Diabetes mellitus type II   . Diverticulitis    . Dyslipidemia   . HOH (hard of hearing)   . Hypertension   . Hypothyroidism   . Sleep apnea   . Sore throat    Past Surgical History:  Procedure Laterality Date  . APPENDECTOMY    . BREAST SURGERY     h/o benign cystleft breast and lymp node removal on right breast area.  Marland Kitchen EYE SURGERY     Left eye cataract removal, right eye h/o macular degeneration.  . INTRAVASCULAR PRESSURE WIRE/FFR STUDY N/A 12/07/2017   Procedure: INTRAVASCULAR PRESSURE WIRE/FFR STUDY;  Surgeon: Leonie Man, MD;  Location: [facility];  Service: Cardiovascular;  Laterality: N/A;  . LEFT HEART CATH AND CORONARY ANGIOGRAPHY N/A 12/07/2017   Procedure: LEFT HEART CATH AND CORONARY ANGIOGRAPHY;  Surgeon: Leonie Man, MD;  Location: [facility];  Service: Cardiovascular;  Laterality: N/A;  . SPINE SURGERY     Pt not sure of the type of surgery she had but knows it was L4-5.  Marland Kitchen VAGINAL HYSTERECTOMY     Social History   Tobacco Use  . Smoking status: Current Some Day Smoker    Packs/day: 0.25    Years: 51.00    Pack years: 12.75    Types: Cigarettes    Start date: 01/02/2012  . Smokeless  tobacco: Never Used  . Tobacco comment: Been down to 4 - 6 cigs a day  Substance Use Topics  . Alcohol use: No   family history includes Alcohol abuse in her brother; Anxiety disorder in her mother; Asthma in her son; COPD in her father and sister; Cancer in her sister; Depression in her mother; Diabetes in her father and son; Diabetes type II in her mother; Drug abuse in her brother; Glaucoma in her father; Hashimoto's thyroiditis in her sister; Heart attack in her mother; Hypertension in her father; Kidney disease in her sister; Stroke in her mother.  Medications: Current Outpatient Medications  Medication Sig Dispense Refill  . ACCU-CHEK AVIVA PLUS test strip TEST ONCE TO TWICE DAILY 150 strip 2  . acetaminophen (TYLENOL) 500 MG tablet Take 1,000 mg by mouth every 6 (six) hours as needed for mild pain  or moderate pain.    Marland Kitchen albuterol (PROVENTIL HFA;VENTOLIN HFA) 108 (90 Base) MCG/ACT inhaler Inhale 2 puffs into the lungs every 4 (four) hours as needed for wheezing. 2 Inhaler 11  . AMBULATORY NON FORMULARY MEDICATION Glucometer, test strips and lancets for testing once a day for DM, uncontrolled. 100 strip 0  . AMBULATORY NON FORMULARY MEDICATION Knee-high, medium compression, graduated compression stockings. Apply to lower extremities. Disp per patient preference and insurance coverage. 10 each prn  . AMBULATORY NON FORMULARY MEDICATION Standard walker use as needed. R26.2 M54.9 Disp 1 1 Units prn  . amitriptyline (ELAVIL) 50 MG tablet TAKE 1 TABLET BY MOUTH EVERY NIGHT AT BEDTIME 90 tablet 3  . aspirin EC 81 MG tablet Take 1 tablet (81 mg total) by mouth daily. 90 tablet 3  . cholecalciferol (VITAMIN D) 1000 units tablet Take 1,000 Units by mouth daily.    . colchicine 0.6 MG tablet Take 1 tablet (0.6 mg total) by mouth daily. 90 tablet 3  . diclofenac sodium (VOLTAREN) 1 % GEL Apply 4 g topically 4 (four) times daily. To affected joint. 100 g 11  . dicyclomine (BENTYL) 10 MG capsule Take 1 capsule (10 mg total) by mouth 2 (two) times daily. 180 capsule 3  . Dulaglutide (TRULICITY) 6.76 PP/5.0DT SOPN Inject 0.5 mLs into the skin once a week. 4 pen 11  . EPINEPHrine (EPIPEN 2-PAK) 0.3 mg/0.3 mL IJ SOAJ injection Inject 0.3 mLs (0.3 mg total) into the muscle as needed for anaphylaxis. 1 Device prn  . famotidine (PEPCID) 20 MG tablet TAKE 1 TABLET(20 MG TOTAL) BY MOUTH DAILY. 90 tablet 3  . Fluticasone-Salmeterol (ADVAIR) 250-50 MCG/DOSE AEPB Inhale 1 puff into the lungs every 12 (twelve) hours. 60 each 11  . gabapentin (NEURONTIN) 300 MG capsule TAKE 1 CAPSULE AT BEDTIME X 1 WEEK, 1 TWICE DAILY X 1 WEEK, THEN 1 THREE TIMES DAILY. MAY DOUBLE WEEKLY UP TO 3600MG/DAY AS DIRECTED 180 capsule 3  . GLUCOSAMINE-CHONDROITIN PO Take 1 tablet by mouth 2 (two) times daily.     Marland Kitchen HYDROcodone-acetaminophen  (NORCO/VICODIN) 5-325 MG tablet Take 1 tablet by mouth every 6 (six) hours as needed. 15 tablet 0  . Insulin Degludec (TRESIBA FLEXTOUCH) 200 UNIT/ML SOPN Inject 28-60 Units into the skin at bedtime. 15 mL 99  . Insulin Pen Needle (BD PEN NEEDLE NANO U/F) 32G X 4 MM MISC USE AS DIRECTED 100 each 99  . isosorbide mononitrate (IMDUR) 30 MG 24 hr tablet TAKE 1 TABLET(30 MG) BY MOUTH DAILY 90 tablet 1  . JARDIANCE 10 MG TABS tablet TAKE 1 TABLET BY MOUTH DAILY 90 tablet 1  .  levocetirizine (XYZAL) 5 MG tablet Take 5 mg by mouth every evening.    Marland Kitchen levothyroxine (SYNTHROID, LEVOTHROID) 150 MCG tablet TAKE 1 TABLET BY MOUTH DAILY BEFORE BREAKFAST 90 tablet 3  . LORazepam (ATIVAN) 0.5 MG tablet 1-2 tabs 30 - 60 min prior to MRI. Do not drive with this medicine. 4 tablet 0  . LUMIGAN 0.01 % SOLN Instill 1 drop into both eyes every night  3  . meclizine (ANTIVERT) 25 MG tablet Take 1 tablet (25 mg total) by mouth 3 (three) times daily. 90 tablet 2  . metFORMIN (GLUCOPHAGE-XR) 750 MG 24 hr tablet TAKE 1 TABLET(750 MG TOTAL) BY MOUTH DAILY WITH BREAKFAST. 90 tablet 1  . Multiple Vitamins-Minerals (PRESERVISION AREDS 2 PO) Take 1 capsule by mouth 2 (two) times daily.    . nitroGLYCERIN (NITROSTAT) 0.4 MG SL tablet Place 1 tablet (0.4 mg total) under the tongue every 5 (five) minutes as needed for chest pain. 25 tablet 11  . ondansetron (ZOFRAN) 8 MG tablet TAKE 1 TABLET BY MOUTH EVERY 8 HOURS AS NEEDED FOR NAUSEA AND VOMITING 20 tablet 2  . pregabalin (LYRICA) 75 MG capsule TAKE 1 CAPSULE(75 MG) BY MOUTH THREE TIMES DAILY 90 capsule 0  . propranolol ER (INDERAL LA) 60 MG 24 hr capsule 75 mg. Pt reports recently increased dose    . QUEtiapine (SEROQUEL) 200 MG tablet TAKE 1 TABLET(200 MG) BY MOUTH AT BEDTIME 90 tablet 0  . rosuvastatin (CRESTOR) 40 MG tablet Take 1 tablet (40 mg total) by mouth daily. 90 tablet 3  . sertraline (ZOLOFT) 100 MG tablet Take 2 tablets (200 mg total) by mouth daily. 120 tablet 3    No current facility-administered medications for this visit.    Allergies  Allergen Reactions  . Bee Venom Anaphylaxis  . Iodinated Diagnostic Agents Other (See Comments)    Per patient cardiac arrest IVP DYE-CARDIAC ARREST  . Penicillins Anaphylaxis    Has patient had a PCN reaction causing immediate rash, facial/tongue/throat swelling, SOB or lightheadedness with hypotension: yes Has patient had a PCN reaction causing severe rash involving mucus membranes or skin necrosis: no Has patient had a PCN reaction that required hospitalization: yes Has patient had a PCN reaction occurring within the last 10 years: no If all of the above answers are "NO", then may proceed with Cephalosporin use.   . Povidone Iodine Itching and Rash  . Tomato Itching  . Chocolate Rash  . Latex Rash  . Other Rash  . Gluten Meal   . Zolpidem Tartrate Nausea And Vomiting  . Atorvastatin Calcium Nausea Only  . Esomeprazole Magnesium Nausea Only  . Metformin And Related Diarrhea  . Quetiapine Fumarate     Per pt she can not take XR  . Trazodone And Nefazodone Hives     Discussed warning signs or symptoms. Please see discharge instructions. Patient expresses understanding.

## 2019-03-01 ENCOUNTER — Telehealth: Payer: Self-pay | Admitting: Family Medicine

## 2019-03-01 NOTE — Telephone Encounter (Signed)
As per [place], pt declined mammogram and Flu vaccine at last visit with Dr. Georgina Snell. Health maintenance has been updated.

## 2019-03-01 NOTE — Telephone Encounter (Signed)
Please call patient: Last mammogram was 3 years ago she is due.  If she is okay with scheduling then please order and if not then please mark declined.  We strongly encourage mammograms until at least age 74.  Looks like she is also due for flu vaccine see if she is Artie gotten it this year and see if we can get her scheduled or either decline if she plans on getting elsewhere later this year

## 2019-03-01 NOTE — Telephone Encounter (Signed)
OK, we just have to make sure we are marking it, otherwise we don't get credit for this.

## 2019-03-07 ENCOUNTER — Ambulatory Visit: Payer: Medicare HMO | Admitting: Family Medicine

## 2019-03-09 ENCOUNTER — Other Ambulatory Visit: Payer: Self-pay | Admitting: Cardiology

## 2019-03-09 DIAGNOSIS — E7800 Pure hypercholesterolemia, unspecified: Secondary | ICD-10-CM

## 2019-03-12 ENCOUNTER — Ambulatory Visit: Payer: Medicare HMO | Admitting: Family Medicine

## 2019-03-16 ENCOUNTER — Other Ambulatory Visit: Payer: Self-pay | Admitting: Osteopathic Medicine

## 2019-03-16 NOTE — Telephone Encounter (Signed)
Forwarding medication refill requests to the clinical pool for review. 

## 2019-03-18 ENCOUNTER — Ambulatory Visit (INDEPENDENT_AMBULATORY_CARE_PROVIDER_SITE_OTHER): Payer: Medicare HMO | Admitting: Family Medicine

## 2019-03-18 ENCOUNTER — Other Ambulatory Visit: Payer: Self-pay

## 2019-03-18 VITALS — BP 98/67 | HR 87 | Temp 98.0°F | Ht 63.0 in | Wt 211.0 lb

## 2019-03-18 DIAGNOSIS — I10 Essential (primary) hypertension: Secondary | ICD-10-CM

## 2019-03-18 DIAGNOSIS — R809 Proteinuria, unspecified: Secondary | ICD-10-CM | POA: Diagnosis not present

## 2019-03-18 DIAGNOSIS — M4807 Spinal stenosis, lumbosacral region: Secondary | ICD-10-CM | POA: Diagnosis not present

## 2019-03-18 DIAGNOSIS — E118 Type 2 diabetes mellitus with unspecified complications: Secondary | ICD-10-CM

## 2019-03-18 DIAGNOSIS — E088 Diabetes mellitus due to underlying condition with unspecified complications: Secondary | ICD-10-CM

## 2019-03-18 DIAGNOSIS — E1129 Type 2 diabetes mellitus with other diabetic kidney complication: Secondary | ICD-10-CM | POA: Diagnosis not present

## 2019-03-18 DIAGNOSIS — J449 Chronic obstructive pulmonary disease, unspecified: Secondary | ICD-10-CM | POA: Diagnosis not present

## 2019-03-18 MED ORDER — FLUTICASONE-SALMETEROL 250-50 MCG/DOSE IN AEPB: 1.0000 | INHALATION_SPRAY | Freq: Two times a day (BID) | RESPIRATORY_TRACT | 12 refills | Status: DC

## 2019-03-18 NOTE — Patient Instructions (Signed)
Thank you for coming in today. Restart Advair twice daily for breathing.  Use albuterol as needed.  Continue blood pressure and blood glucose log.

## 2019-03-18 NOTE — Progress Notes (Signed)
Autumn Bowen is a 73 y.o. female who presents to [facility] today for follow-up asthma falls hypertension and diabetes  Hypotension: Patient was having some hypotension episodes thought to be partially responsible for her falls at the last visit on September 29.  Losartan was discontinued.  Isosorbide mononitrate was continued.  In the interim she notes blood pressure has been better controlled.  She has a blood pressure log showing pressures usually in the systolic 001V to 494W.  No chest pain palpitations.  Still occasional episodes of lightheadedness.  No syncopal episodes.  Diabetes: Managed with Jardiance, Metformin.  Blood sugars are usually 80s to 110s.  No polyuria or polydipsia.  No hypoglycemic episode.    COPD: Using her rescue inhaler more frequently.  She notes that she used to have Advair discus but no longer has any rescue inhaler.  She like a refill of the Advair Diskus.  She thinks that works best for her.  She has an old prescription for 250 mg strength she would like to have refill.  [name] is still having frequent falls.  She attributes this to lower extremity weakness and back pain.  She is in the process of being evaluated by neurosurgery.  She is having trouble getting her subsequent testing completed including MRI and nerve conduction study.  She gets these test scheduled and then has some incident that has to have been rescheduled.   ROS as above:  Exam:  BP 98/67   Pulse 87   Temp 98 F (36.7 C)   Ht 5' 3"  (1.6 m)   Wt 211 lb (95.7 kg)   LMP 09/21/1974   SpO2 94%   BMI 37.38 kg/m  Wt Readings from Last 5 Encounters:  03/18/19 211 lb (95.7 kg)  02/26/19 207 lb (93.9 kg)  02/07/19 210 lb (95.3 kg)  12/25/18 215 lb (97.5 kg)  12/13/18 215 lb (97.5 kg)    Gen: Well NAD HEENT: EOMI,  MMM Lungs: Normal work of breathing. CTABL Heart: RRR  no MRG Abd: NABS, Soft. Nondistended, Nontender Exts: Brisk capillary refill, warm and well perfused.      Assessment and Plan: 73 y.o. female with  Hypotension: Blood pressure better controlled.  Plan to continue current regimen and recheck in near future.  COPD: Add back Advair Diskus.  Continue albuterol as needed.  Diabetes: Well controlled.  Continue current regimen and recheck in about a month or so.  Back pain lower leg weakness and falls.  Continue follow-up with neurosurgery.  Ultimately will benefit from surgical evaluation.  Recheck in about a month with Dr. Sheppard Coil.  PDMP not reviewed this encounter. No orders of the defined types were placed in this encounter.  Meds ordered this encounter  Medications  . Fluticasone-Salmeterol (ADVAIR DISKUS) 250-50 MCG/DOSE AEPB    Sig: Inhale 1 puff into the lungs 2 (two) times daily.    Dispense:  60 each    Refill:  12     Historical information moved to improve visibility of documentation.  Past Medical History:  Diagnosis Date  . Allergy    Notes that grass and some trees causes eyes to sting, burn, and water.  . Anemia   . Celiac disease   . Celiac disease   . COPD (chronic obstructive pulmonary disease) ([place])   . Diabetes mellitus type II   . Diverticulitis   . Dyslipidemia   . HOH (hard of hearing)   .  Hypertension   . Hypothyroidism   . Sleep apnea   . Sore throat    Past Surgical History:  Procedure Laterality Date  . APPENDECTOMY    . BREAST SURGERY     h/o benign cystleft breast and lymp node removal on right breast area.  Marland Kitchen EYE SURGERY     Left eye cataract removal, right eye h/o macular degeneration.  . INTRAVASCULAR PRESSURE WIRE/FFR STUDY N/A 12/07/2017   Procedure: INTRAVASCULAR PRESSURE WIRE/FFR STUDY;  Surgeon: Leonie Man, MD;  Location: [facility];  Service: Cardiovascular;  Laterality: N/A;  . LEFT HEART CATH AND CORONARY ANGIOGRAPHY N/A 12/07/2017   Procedure: LEFT HEART CATH  AND CORONARY ANGIOGRAPHY;  Surgeon: Leonie Man, MD;  Location: [facility];  Service: Cardiovascular;  Laterality: N/A;  . SPINE SURGERY     Pt not sure of the type of surgery she had but knows it was L4-5.  Marland Kitchen VAGINAL HYSTERECTOMY     Social History   Tobacco Use  . Smoking status: Current Some Day Smoker    Packs/day: 0.25    Years: 51.00    Pack years: 12.75    Types: Cigarettes    Start date: 01/02/2012  . Smokeless tobacco: Never Used  . Tobacco comment: Been down to 4 - 6 cigs a day  Substance Use Topics  . Alcohol use: No   family history includes Alcohol abuse in her brother; Anxiety disorder in her mother; Asthma in her son; COPD in her father and sister; Cancer in her sister; Depression in her mother; Diabetes in her father and son; Diabetes type II in her mother; Drug abuse in her brother; Glaucoma in her father; Hashimoto's thyroiditis in her sister; Heart attack in her mother; Hypertension in her father; Kidney disease in her sister; Stroke in her mother.  Medications: Current Outpatient Medications  Medication Sig Dispense Refill  . ACCU-CHEK AVIVA PLUS test strip TEST ONCE TO TWICE DAILY 150 strip 2  . acetaminophen (TYLENOL) 500 MG tablet Take 1,000 mg by mouth every 6 (six) hours as needed for mild pain or moderate pain.    Marland Kitchen albuterol (PROVENTIL HFA;VENTOLIN HFA) 108 (90 Base) MCG/ACT inhaler Inhale 2 puffs into the lungs every 4 (four) hours as needed for wheezing. 2 Inhaler 11  . AMBULATORY NON FORMULARY MEDICATION Glucometer, test strips and lancets for testing once a day for DM, uncontrolled. 100 strip 0  . AMBULATORY NON FORMULARY MEDICATION Knee-high, medium compression, graduated compression stockings. Apply to lower extremities. Disp per patient preference and insurance coverage. 10 each prn  . AMBULATORY NON FORMULARY MEDICATION Standard walker use as needed. R26.2 M54.9 Disp 1 1 Units prn  . amitriptyline (ELAVIL) 50 MG tablet TAKE 1 TABLET BY  MOUTH EVERY NIGHT AT BEDTIME 90 tablet 3  . aspirin EC 81 MG tablet Take 1 tablet (81 mg total) by mouth daily. 90 tablet 3  . cholecalciferol (VITAMIN D) 1000 units tablet Take 1,000 Units by mouth daily.    . colchicine 0.6 MG tablet Take 1 tablet (0.6 mg total) by mouth daily. 90 tablet 3  . diclofenac sodium (VOLTAREN) 1 % GEL Apply 4 g topically 4 (four) times daily. To affected joint. 100 g 11  . dicyclomine (BENTYL) 10 MG capsule Take 1 capsule (10 mg total) by mouth 2 (two) times daily. 180 capsule 3  . Dulaglutide (TRULICITY) 8.11 XB/2.6OM SOPN Inject 0.5 mLs into the skin once a week. 4 pen 11  . EPINEPHrine (EPIPEN  2-PAK) 0.3 mg/0.3 mL IJ SOAJ injection Inject 0.3 mLs (0.3 mg total) into the muscle as needed for anaphylaxis. 1 Device prn  . famotidine (PEPCID) 20 MG tablet TAKE 1 TABLET(20 MG TOTAL) BY MOUTH DAILY. 90 tablet 3  . gabapentin (NEURONTIN) 300 MG capsule TAKE 1 CAPSULE AT BEDTIME X 1 WEEK, 1 TWICE DAILY X 1 WEEK, THEN 1 THREE TIMES DAILY. MAY DOUBLE WEEKLY UP TO 3600MG/DAY AS DIRECTED 180 capsule 3  . GLUCOSAMINE-CHONDROITIN PO Take 1 tablet by mouth 2 (two) times daily.     Marland Kitchen HYDROcodone-acetaminophen (NORCO/VICODIN) 5-325 MG tablet Take 1 tablet by mouth every 6 (six) hours as needed. 15 tablet 0  . Insulin Degludec (TRESIBA FLEXTOUCH) 200 UNIT/ML SOPN Inject 28-60 Units into the skin at bedtime. 15 mL 99  . Insulin Pen Needle (BD PEN NEEDLE NANO U/F) 32G X 4 MM MISC USE AS DIRECTED 100 each 99  . isosorbide mononitrate (IMDUR) 30 MG 24 hr tablet TAKE 1 TABLET(30 MG) BY MOUTH DAILY 90 tablet 1  . JARDIANCE 10 MG TABS tablet TAKE 1 TABLET BY MOUTH DAILY 90 tablet 1  . levocetirizine (XYZAL) 5 MG tablet Take 5 mg by mouth every evening.    Marland Kitchen levothyroxine (SYNTHROID, LEVOTHROID) 150 MCG tablet TAKE 1 TABLET BY MOUTH DAILY BEFORE BREAKFAST 90 tablet 3  . LORazepam (ATIVAN) 0.5 MG tablet 1-2 tabs 30 - 60 min prior to MRI. Do not drive with this medicine. 4 tablet 0  . LUMIGAN  0.01 % SOLN Instill 1 drop into both eyes every night  3  . meclizine (ANTIVERT) 25 MG tablet Take 1 tablet (25 mg total) by mouth 3 (three) times daily. 90 tablet 2  . metFORMIN (GLUCOPHAGE-XR) 750 MG 24 hr tablet TAKE 1 TABLET(750 MG TOTAL) BY MOUTH DAILY WITH BREAKFAST. 90 tablet 1  . Multiple Vitamins-Minerals (PRESERVISION AREDS 2 PO) Take 1 capsule by mouth 2 (two) times daily.    . nitroGLYCERIN (NITROSTAT) 0.4 MG SL tablet Place 1 tablet (0.4 mg total) under the tongue every 5 (five) minutes as needed for chest pain. 25 tablet 11  . ondansetron (ZOFRAN) 8 MG tablet TAKE 1 TABLET BY MOUTH EVERY 8 HOURS AS NEEDED FOR NAUSEA AND VOMITING 20 tablet 2  . pregabalin (LYRICA) 75 MG capsule TAKE 1 CAPSULE(75 MG) BY MOUTH THREE TIMES DAILY 90 capsule 0  . propranolol ER (INDERAL LA) 60 MG 24 hr capsule 75 mg. Pt reports recently increased dose    . QUEtiapine (SEROQUEL) 200 MG tablet TAKE 1 TABLET(200 MG) BY MOUTH AT BEDTIME 90 tablet 0  . rosuvastatin (CRESTOR) 40 MG tablet TAKE 1 TABLET(40 MG) BY MOUTH DAILY 90 tablet 0  . sertraline (ZOLOFT) 100 MG tablet Take 2 tablets (200 mg total) by mouth daily. 120 tablet 3  . Fluticasone-Salmeterol (ADVAIR DISKUS) 250-50 MCG/DOSE AEPB Inhale 1 puff into the lungs 2 (two) times daily. 60 each 12   No current facility-administered medications for this visit.    Allergies  Allergen Reactions  . Bee Venom Anaphylaxis  . Iodinated Diagnostic Agents Other (See Comments)    Per patient cardiac arrest IVP DYE-CARDIAC ARREST  . Penicillins Anaphylaxis    Has patient had a PCN reaction causing immediate rash, facial/tongue/throat swelling, SOB or lightheadedness with hypotension: yes Has patient had a PCN reaction causing severe rash involving mucus membranes or skin necrosis: no Has patient had a PCN reaction that required hospitalization: yes Has patient had a PCN reaction occurring within the last 10 years:  no If all of the above answers are "NO", then  may proceed with Cephalosporin use.   . Povidone Iodine Itching and Rash  . Tomato Itching  . Chocolate Rash  . Latex Rash  . Other Rash  . Gluten Meal   . Zolpidem Tartrate Nausea And Vomiting  . Atorvastatin Calcium Nausea Only  . Esomeprazole Magnesium Nausea Only  . Metformin And Related Diarrhea  . Quetiapine Fumarate     Per pt she can not take XR  . Trazodone And Nefazodone Hives     Discussed warning signs or symptoms. Please see discharge instructions. Patient expresses understanding.

## 2019-03-19 ENCOUNTER — Other Ambulatory Visit: Payer: Self-pay

## 2019-03-19 DIAGNOSIS — M4807 Spinal stenosis, lumbosacral region: Secondary | ICD-10-CM | POA: Insufficient documentation

## 2019-03-19 MED ORDER — SERTRALINE HCL 100 MG PO TABS: 200.0000 mg | ORAL_TABLET | Freq: Every day | ORAL | 3 refills | Status: DC

## 2019-03-19 MED ORDER — QUETIAPINE FUMARATE 200 MG PO TABS: ORAL_TABLET | ORAL | 0 refills | Status: DC

## 2019-03-20 ENCOUNTER — Ambulatory Visit: Payer: Medicare HMO | Admitting: Osteopathic Medicine

## 2019-04-16 ENCOUNTER — Other Ambulatory Visit: Payer: Self-pay | Admitting: Family Medicine

## 2019-04-16 DIAGNOSIS — G629 Polyneuropathy, unspecified: Secondary | ICD-10-CM

## 2019-04-16 DIAGNOSIS — M5417 Radiculopathy, lumbosacral region: Secondary | ICD-10-CM | POA: Diagnosis not present

## 2019-04-16 NOTE — Telephone Encounter (Signed)
Last RF 02/05/19 for #90, 1 TID, no RF  Last appt 03/18/19  RX pended, please send if appropriate

## 2019-04-18 ENCOUNTER — Ambulatory Visit (INDEPENDENT_AMBULATORY_CARE_PROVIDER_SITE_OTHER): Payer: Medicare HMO | Admitting: Osteopathic Medicine

## 2019-04-18 DIAGNOSIS — Z5329 Procedure and treatment not carried out because of patient's decision for other reasons: Secondary | ICD-10-CM

## 2019-04-18 NOTE — Progress Notes (Signed)
Called pt at 1245 pm, 155 pm and 240 pm, no answer. Left multiple vm msgs.

## 2019-04-22 DIAGNOSIS — R531 Weakness: Secondary | ICD-10-CM | POA: Diagnosis not present

## 2019-04-22 DIAGNOSIS — R296 Repeated falls: Secondary | ICD-10-CM | POA: Diagnosis not present

## 2019-04-22 DIAGNOSIS — R29898 Other symptoms and signs involving the musculoskeletal system: Secondary | ICD-10-CM | POA: Diagnosis not present

## 2019-05-01 DIAGNOSIS — R296 Repeated falls: Secondary | ICD-10-CM | POA: Diagnosis not present

## 2019-05-01 DIAGNOSIS — M5136 Other intervertebral disc degeneration, lumbar region: Secondary | ICD-10-CM | POA: Diagnosis not present

## 2019-05-01 DIAGNOSIS — M5030 Other cervical disc degeneration, unspecified cervical region: Secondary | ICD-10-CM | POA: Diagnosis not present

## 2019-05-01 DIAGNOSIS — M4802 Spinal stenosis, cervical region: Secondary | ICD-10-CM | POA: Diagnosis not present

## 2019-05-02 ENCOUNTER — Telehealth: Payer: Self-pay | Admitting: Osteopathic Medicine

## 2019-05-02 ENCOUNTER — Other Ambulatory Visit: Payer: Self-pay

## 2019-05-02 ENCOUNTER — Emergency Department (INDEPENDENT_AMBULATORY_CARE_PROVIDER_SITE_OTHER)
Admission: EM | Admit: 2019-05-02 | Discharge: 2019-05-02 | Disposition: A | Discharge status: Home / Self Care | Payer: Medicare HMO | Source: Home / Self Care

## 2019-05-02 DIAGNOSIS — E119 Type 2 diabetes mellitus without complications: Secondary | ICD-10-CM | POA: Diagnosis not present

## 2019-05-02 DIAGNOSIS — R55 Syncope and collapse: Secondary | ICD-10-CM

## 2019-05-02 DIAGNOSIS — R109 Unspecified abdominal pain: Secondary | ICD-10-CM | POA: Diagnosis not present

## 2019-05-02 DIAGNOSIS — Z8673 Personal history of transient ischemic attack (TIA), and cerebral infarction without residual deficits: Secondary | ICD-10-CM | POA: Diagnosis not present

## 2019-05-02 DIAGNOSIS — I10 Essential (primary) hypertension: Secondary | ICD-10-CM | POA: Diagnosis not present

## 2019-05-02 DIAGNOSIS — Z91030 Bee allergy status: Secondary | ICD-10-CM | POA: Diagnosis not present

## 2019-05-02 DIAGNOSIS — F329 Major depressive disorder, single episode, unspecified: Secondary | ICD-10-CM | POA: Diagnosis not present

## 2019-05-02 DIAGNOSIS — I252 Old myocardial infarction: Secondary | ICD-10-CM | POA: Diagnosis not present

## 2019-05-02 DIAGNOSIS — F17210 Nicotine dependence, cigarettes, uncomplicated: Secondary | ICD-10-CM | POA: Diagnosis not present

## 2019-05-02 DIAGNOSIS — M542 Cervicalgia: Secondary | ICD-10-CM | POA: Diagnosis not present

## 2019-05-02 DIAGNOSIS — R1084 Generalized abdominal pain: Secondary | ICD-10-CM | POA: Diagnosis not present

## 2019-05-02 DIAGNOSIS — R000 Tachycardia, unspecified: Secondary | ICD-10-CM | POA: Diagnosis not present

## 2019-05-02 NOTE — Telephone Encounter (Signed)
Patient had left a VM that she was having some arm pain and pressure in her chest. I have let the patient know that she has to be evaluated at [facility]. She is going to [facility] to be evaluated.

## 2019-05-02 NOTE — ED Triage Notes (Signed)
Pt c/o chest/RT shoulder pain that's been worsening over the last few days. Also mentions having a syncopal episode last night at home.

## 2019-05-02 NOTE — Discharge Instructions (Addendum)
Go to the Emergency department

## 2019-05-02 NOTE — ED Provider Notes (Signed)
[facility]    CSN: [id number] Arrival date & time: 05/02/19  1558      History   Chief Complaint Chief Complaint  Patient presents with  . Chest Pain    RT shoulder  . Loss of Consciousness    HPI Autumn Bowen is a 73 y.o. female.    Loss of Consciousness Episode history:  Single Most recent episode:  Yesterday Timing:  Unable to specify Chronicity:  New Worsened by:  Nothing Ineffective treatments:  None tried  Pt reports she locked her door last night at 6pm.  Pt reports she woke up this morning on the floor by her door.  She does not recall what happened.  Pt reports she falls frequently but falls are mechanical.  Pt reports she does not recall a headache or chest pain.  Pt reports she has had cardiac evaluations and told she has a 65% blockage.  Pt reports she has had MRi's in the past due to weakness.  Pt denies any chest pain. No shortness of breath Past Medical History:  Diagnosis Date  . Allergy    Notes that grass and some trees causes eyes to sting, burn, and water.  . Anemia   . Celiac disease   . Celiac disease   . COPD (chronic obstructive pulmonary disease) ([place])   . Diabetes mellitus type II   . Diverticulitis   . Dyslipidemia   . HOH (hard of hearing)   . Hypertension   . Hypothyroidism   . Sleep apnea   . Sore throat     Patient Active Problem List   Diagnosis Date Noted  . Spinal stenosis of lumbosacral region 03/19/2019  . HTN (hypertension) 02/08/2019  . Injury of left knee 02/21/2018  . Microalbuminuria due to type 2 diabetes mellitus ([place]) 02/21/2018  . Hyperlipidemia associated with type 2 diabetes mellitus ([place]) 02/21/2018  . CAD (coronary artery disease) 01/01/2018  . Chronic pain syndrome 12/13/2017  . Angina pectoris ([place]) 12/07/2017  . Diabetes mellitus due to underlying condition with unspecified complications ([place]) 03/28/1313  . Radiographic dye allergy status 12/05/2017  . Pseudogout of left knee 11/16/2017   . Diverticulitis of colon 04/12/2016  . Broken or cracked tooth, nontraumatic 03/27/2016  . Memory deficit 05/05/2015  . Hearing difficulty of both ears 05/05/2015  . OSA (obstructive sleep apnea) 04/28/2015  . Seborrheic keratoses 01/31/2015  . Left-sided low back pain with left-sided sciatica 01/31/2015  . GERD (gastroesophageal reflux disease) 01/26/2015  . Celiac disease 01/26/2015  . Cervical spondylosis 12/04/2014  . Osteoarthritis of first metatarsophalangeal joint 11/17/2014  . Seasonal allergies 10/04/2014  . Neuropathy 10/04/2014  . Toenail fungus 10/03/2014  . Adenomatous polyp of colon 07/30/2014  . Macular degeneration, dry 07/29/2014  . Type 2 diabetes mellitus with complication ([place]) 38/88/7579  . Migraine without aura and without status migrainosus, not intractable 07/28/2014  . COPD (chronic obstructive pulmonary disease) with chronic bronchitis ([place]) 05/10/2009  . VISION DISORDER 12/03/2008  . OSTEOPENIA 11/18/2008  . VITAMIN D DEFICIENCY 11/06/2008  . Major depressive disorder 11/05/2008  . FATIGUE 11/05/2008  . ANEMIA 10/23/2008  . DYSLIPIDEMIA 10/16/2008  . TOBACCO ABUSE 10/15/2008  . ALLERGIC RHINITIS 10/15/2008  . POST TRAUMATIC STRESS SYNDROME 05/30/2000  . Hypothyroidism 05/30/1988  . POSTMENOPAUSAL STATUS 05/30/1968    Past Surgical History:  Procedure Laterality Date  . APPENDECTOMY    . BREAST SURGERY     h/o benign cystleft breast and lymp node removal on right breast area.  Marland Kitchen  EYE SURGERY     Left eye cataract removal, right eye h/o macular degeneration.  . INTRAVASCULAR PRESSURE WIRE/FFR STUDY N/A 12/07/2017   Procedure: INTRAVASCULAR PRESSURE WIRE/FFR STUDY;  Surgeon: Leonie Man, MD;  Location: [facility];  Service: Cardiovascular;  Laterality: N/A;  . LEFT HEART CATH AND CORONARY ANGIOGRAPHY N/A 12/07/2017   Procedure: LEFT HEART CATH AND CORONARY ANGIOGRAPHY;  Surgeon: Leonie Man, MD;  Location: [facility];   Service: Cardiovascular;  Laterality: N/A;  . SPINE SURGERY     Pt not sure of the type of surgery she had but knows it was L4-5.  Marland Kitchen VAGINAL HYSTERECTOMY      OB History   No obstetric history on file.      Home Medications    Prior to Admission medications   Medication Sig Start Date End Date Taking? Authorizing Provider  ACCU-CHEK AVIVA PLUS test strip TEST ONCE TO TWICE DAILY 01/28/19   Gregor Hams, MD  acetaminophen (TYLENOL) 500 MG tablet Take 1,000 mg by mouth every 6 (six) hours as needed for mild pain or moderate pain.    [provider]  albuterol (PROVENTIL HFA;VENTOLIN HFA) 108 (90 Base) MCG/ACT inhaler Inhale 2 puffs into the lungs every 4 (four) hours as needed for wheezing. 07/23/18   Emeterio Reeve, DO  AMBULATORY NON FORMULARY MEDICATION Glucometer, test strips and lancets for testing once a day for DM, uncontrolled. 10/04/14   Breeback, Jade L, PA-C  AMBULATORY NON FORMULARY MEDICATION Knee-high, medium compression, graduated compression stockings. Apply to lower extremities. Disp per patient preference and insurance coverage. 05/24/18   Emeterio Reeve, DO  AMBULATORY NON FORMULARY MEDICATION Standard walker use as needed. R26.2 M54.9 Disp 1 08/24/18   Gregor Hams, MD  amitriptyline (ELAVIL) 50 MG tablet TAKE 1 TABLET BY MOUTH EVERY NIGHT AT BEDTIME 05/10/18   Emeterio Reeve, DO  aspirin EC 81 MG tablet Take 1 tablet (81 mg total) by mouth daily. 12/05/17   Revankar, Reita Cliche, MD  cholecalciferol (VITAMIN D) 1000 units tablet Take 1,000 Units by mouth daily.    [provider]  colchicine 0.6 MG tablet Take 1 tablet (0.6 mg total) by mouth daily. 08/15/18   Emeterio Reeve, DO  diclofenac sodium (VOLTAREN) 1 % GEL Apply 4 g topically 4 (four) times daily. To affected joint. 04/03/18   Gregor Hams, MD  dicyclomine (BENTYL) 10 MG capsule Take 1 capsule (10 mg total) by mouth 2 (two) times daily. 12/13/18   Emeterio Reeve, DO  Dulaglutide  (TRULICITY) 1.61 WR/6.0AV SOPN Inject 0.5 mLs into the skin once a week. 01/09/19   Gregor Hams, MD  EPINEPHrine (EPIPEN 2-PAK) 0.3 mg/0.3 mL IJ SOAJ injection Inject 0.3 mLs (0.3 mg total) into the muscle as needed for anaphylaxis. 07/31/18   Emeterio Reeve, DO  famotidine (PEPCID) 20 MG tablet TAKE 1 TABLET(20 MG TOTAL) BY MOUTH DAILY. 07/23/18   Emeterio Reeve, DO  Fluticasone-Salmeterol (ADVAIR DISKUS) 250-50 MCG/DOSE AEPB Inhale 1 puff into the lungs 2 (two) times daily. 03/18/19   Gregor Hams, MD  gabapentin (NEURONTIN) 300 MG capsule TAKE 1 CAPSULE AT BEDTIME X 1 WEEK, 1 TWICE DAILY X 1 WEEK, THEN 1 THREE TIMES DAILY. MAY DOUBLE WEEKLY UP TO 3600MG/DAY AS DIRECTED 01/08/19   Gregor Hams, MD  GLUCOSAMINE-CHONDROITIN PO Take 1 tablet by mouth 2 (two) times daily.     [provider]  HYDROcodone-acetaminophen (NORCO/VICODIN) 5-325 MG tablet Take 1 tablet by mouth  every 6 (six) hours as needed. 02/26/19   Gregor Hams, MD  Insulin Degludec (TRESIBA FLEXTOUCH) 200 UNIT/ML SOPN Inject 28-60 Units into the skin at bedtime. 10/29/18   Emeterio Reeve, DO  Insulin Pen Needle (BD PEN NEEDLE NANO U/F) 32G X 4 MM MISC USE AS DIRECTED 12/13/18   Emeterio Reeve, DO  isosorbide mononitrate (IMDUR) 30 MG 24 hr tablet TAKE 1 TABLET(30 MG) BY MOUTH DAILY 10/19/18   Revankar, Reita Cliche, MD  JARDIANCE 10 MG TABS tablet TAKE 1 TABLET BY MOUTH DAILY 02/14/19   Emeterio Reeve, DO  levocetirizine (XYZAL) 5 MG tablet Take 5 mg by mouth every evening.    [provider]  levothyroxine (SYNTHROID, LEVOTHROID) 150 MCG tablet TAKE 1 TABLET BY MOUTH DAILY BEFORE BREAKFAST 07/23/18   Emeterio Reeve, DO  LORazepam (ATIVAN) 0.5 MG tablet 1-2 tabs 30 - 60 min prior to MRI. Do not drive with this medicine. 11/21/18   Gregor Hams, MD  LUMIGAN 0.01 % SOLN Instill 1 drop into both eyes every night 08/17/17   [provider]  meclizine (ANTIVERT) 25 MG tablet Take 1 tablet (25 mg total) by  mouth 3 (three) times daily. 12/13/18   Emeterio Reeve, DO  metFORMIN (GLUCOPHAGE-XR) 750 MG 24 hr tablet TAKE 1 TABLET(750 MG TOTAL) BY MOUTH DAILY WITH BREAKFAST. 07/24/18   Emeterio Reeve, DO  Multiple Vitamins-Minerals (PRESERVISION AREDS 2 PO) Take 1 capsule by mouth 2 (two) times daily.    [provider]  nitroGLYCERIN (NITROSTAT) 0.4 MG SL tablet Place 1 tablet (0.4 mg total) under the tongue every 5 (five) minutes as needed for chest pain. 01/01/19 04/01/19  Gregor Hams, MD  ondansetron (ZOFRAN) 8 MG tablet TAKE 1 TABLET BY MOUTH EVERY 8 HOURS AS NEEDED FOR NAUSEA AND VOMITING 04/06/16   Breeback, Jade L, PA-C  pregabalin (LYRICA) 75 MG capsule TAKE ONE CAPSULE BY MOUTH THREE TIMES DAILY 04/16/19   Silverio Decamp, MD  propranolol ER (INDERAL LA) 60 MG 24 hr capsule 75 mg. Pt reports recently increased dose 03/15/12   [provider]  QUEtiapine (SEROQUEL) 200 MG tablet TAKE 1 TABLET(200 MG) BY MOUTH AT BEDTIME 03/19/19   Gregor Hams, MD  rosuvastatin (CRESTOR) 40 MG tablet TAKE 1 TABLET(40 MG) BY MOUTH DAILY 03/11/19   Lelon Perla, MD  sertraline (ZOLOFT) 100 MG tablet Take 2 tablets (200 mg total) by mouth daily. 03/19/19   Gregor Hams, MD    Family History Family History  Problem Relation Age of Onset  . Anxiety disorder Mother   . Depression Mother   . Heart attack Mother   . Stroke Mother   . Diabetes type II Mother   . Diabetes Father   . Glaucoma Father   . Hypertension Father   . COPD Father   . COPD Sister   . Hashimoto's thyroiditis Sister   . Cancer Sister   . Kidney disease Sister   . Drug abuse Brother   . Alcohol abuse Brother   . Asthma Son   . Diabetes Son     Social History Social History   Tobacco Use  . Smoking status: Current Some Day Smoker    Packs/day: 0.25    Years: 51.00    Pack years: 12.75    Types: Cigarettes    Start date: 01/02/2012  . Smokeless tobacco: Never Used  . Tobacco comment: Been down to  4 - 6 cigs a day  Substance Use Topics  .  Alcohol use: No  . Drug use: No     Allergies   Bee venom, Iodinated diagnostic agents, Penicillins, Povidone iodine, Tomato, Chocolate, Latex, Other, Gluten meal, Zolpidem tartrate, Atorvastatin calcium, Esomeprazole magnesium, Metformin and related, Quetiapine fumarate, and Trazodone and nefazodone   Review of Systems Review of Systems  Cardiovascular: Positive for syncope.  All other systems reviewed and are negative.    Physical Exam Triage Vital Signs ED Triage Vitals [05/02/19 1713]  Enc Vitals Group     BP 97/62     Pulse Rate 84     Resp 18     Temp      Temp src      SpO2 96 %     Weight      Height      Head Circumference      Peak Flow      Pain Score 4     Pain Loc      Pain Edu?      Excl. in [place]?    Orthostatic VS for the past 24 hrs:  BP- Lying Pulse- Lying BP- Sitting Pulse- Sitting BP- Standing at 0 minutes Pulse- Standing at 0 minutes  05/02/19 1716 100/66 85 93/61 87 101/66 89    Updated Vital Signs BP 97/62 (BP Location: Left Arm)   Pulse 84   Resp 18   LMP 09/21/1974   SpO2 96%   Visual Acuity Right Eye Distance:   Left Eye Distance:   Bilateral Distance:    Right Eye Near:   Left Eye Near:    Bilateral Near:     Physical Exam Vitals signs and nursing note reviewed.  Constitutional:      Appearance: She is well-developed and normal weight.  HENT:     Head: Normocephalic.  Neck:     Musculoskeletal: Normal range of motion.  Cardiovascular:     Rate and Rhythm: Normal rate.     Heart sounds: Normal heart sounds.  Pulmonary:     Effort: Pulmonary effort is normal.     Breath sounds: Normal breath sounds.  Abdominal:     General: There is no distension.     Palpations: Abdomen is soft.  Musculoskeletal: Normal range of motion.  Skin:    General: Skin is warm.  Neurological:     General: No focal deficit present.     Mental Status: She is alert and oriented to person, place, and  time.      UC Treatments / Results  Labs (all labs ordered are listed, but only abnormal results are displayed) Labs Reviewed - No data to display  EKG EKG  No acute abnormality Normal sinus rhythm. Pt does not want ambulance transport.  Pt states it cost to much.  Pt insist she will drive herself to [facility] ED.   Radiology No results found.  Procedures Procedures (including critical care time)  Medications Ordered in UC Medications - No data to display  Initial Impression / Assessment and Plan / UC Course  I have reviewed the triage vital signs and the nursing notes.  Pertinent labs & imaging results that were available during my care of the patient were reviewed by me and considered in my medical decision making (see chart for details).     MDM  Pt advised to go to ED for evaluation  Final Clinical Impressions(s) / UC Diagnoses   Final diagnoses:  Syncope and collapse   Discharge Instructions   None    ED Prescriptions  None     PDMP not reviewed this encounter.   Fransico Meadow, [place] 05/02/19 1734

## 2019-05-05 DIAGNOSIS — R52 Pain, unspecified: Secondary | ICD-10-CM | POA: Diagnosis not present

## 2019-05-05 DIAGNOSIS — I10 Essential (primary) hypertension: Secondary | ICD-10-CM | POA: Diagnosis not present

## 2019-05-05 DIAGNOSIS — R202 Paresthesia of skin: Secondary | ICD-10-CM | POA: Diagnosis not present

## 2019-05-05 DIAGNOSIS — G4730 Sleep apnea, unspecified: Secondary | ICD-10-CM | POA: Diagnosis not present

## 2019-05-05 DIAGNOSIS — J439 Emphysema, unspecified: Secondary | ICD-10-CM | POA: Diagnosis not present

## 2019-05-05 DIAGNOSIS — E1169 Type 2 diabetes mellitus with other specified complication: Secondary | ICD-10-CM | POA: Diagnosis not present

## 2019-05-05 DIAGNOSIS — G459 Transient cerebral ischemic attack, unspecified: Secondary | ICD-10-CM | POA: Diagnosis not present

## 2019-05-05 DIAGNOSIS — R531 Weakness: Secondary | ICD-10-CM | POA: Diagnosis not present

## 2019-05-05 DIAGNOSIS — M62838 Other muscle spasm: Secondary | ICD-10-CM | POA: Diagnosis not present

## 2019-05-05 DIAGNOSIS — E785 Hyperlipidemia, unspecified: Secondary | ICD-10-CM | POA: Diagnosis not present

## 2019-05-05 DIAGNOSIS — Z8674 Personal history of sudden cardiac arrest: Secondary | ICD-10-CM | POA: Diagnosis not present

## 2019-05-05 DIAGNOSIS — E079 Disorder of thyroid, unspecified: Secondary | ICD-10-CM | POA: Diagnosis not present

## 2019-05-05 DIAGNOSIS — R4702 Dysphasia: Secondary | ICD-10-CM | POA: Diagnosis not present

## 2019-05-05 DIAGNOSIS — Z8673 Personal history of transient ischemic attack (TIA), and cerebral infarction without residual deficits: Secondary | ICD-10-CM | POA: Diagnosis not present

## 2019-05-08 ENCOUNTER — Other Ambulatory Visit: Payer: Self-pay | Admitting: Cardiology

## 2019-05-14 DIAGNOSIS — S0990XA Unspecified injury of head, initial encounter: Secondary | ICD-10-CM | POA: Diagnosis not present

## 2019-05-14 DIAGNOSIS — R0602 Shortness of breath: Secondary | ICD-10-CM | POA: Diagnosis not present

## 2019-05-14 DIAGNOSIS — J9601 Acute respiratory failure with hypoxia: Secondary | ICD-10-CM | POA: Diagnosis not present

## 2019-05-14 DIAGNOSIS — Z20828 Contact with and (suspected) exposure to other viral communicable diseases: Secondary | ICD-10-CM | POA: Diagnosis not present

## 2019-05-14 DIAGNOSIS — F319 Bipolar disorder, unspecified: Secondary | ICD-10-CM | POA: Diagnosis not present

## 2019-05-14 DIAGNOSIS — E039 Hypothyroidism, unspecified: Secondary | ICD-10-CM | POA: Diagnosis not present

## 2019-05-14 DIAGNOSIS — I959 Hypotension, unspecified: Secondary | ICD-10-CM | POA: Diagnosis not present

## 2019-05-14 DIAGNOSIS — G4733 Obstructive sleep apnea (adult) (pediatric): Secondary | ICD-10-CM | POA: Diagnosis not present

## 2019-05-14 DIAGNOSIS — R918 Other nonspecific abnormal finding of lung field: Secondary | ICD-10-CM | POA: Diagnosis not present

## 2019-05-14 DIAGNOSIS — M47816 Spondylosis without myelopathy or radiculopathy, lumbar region: Secondary | ICD-10-CM | POA: Diagnosis not present

## 2019-05-14 DIAGNOSIS — S199XXA Unspecified injury of neck, initial encounter: Secondary | ICD-10-CM | POA: Diagnosis not present

## 2019-05-14 DIAGNOSIS — Z9181 History of falling: Secondary | ICD-10-CM | POA: Diagnosis not present

## 2019-05-14 DIAGNOSIS — J449 Chronic obstructive pulmonary disease, unspecified: Secondary | ICD-10-CM | POA: Diagnosis not present

## 2019-05-14 DIAGNOSIS — E119 Type 2 diabetes mellitus without complications: Secondary | ICD-10-CM | POA: Diagnosis not present

## 2019-05-14 DIAGNOSIS — R2689 Other abnormalities of gait and mobility: Secondary | ICD-10-CM | POA: Diagnosis not present

## 2019-05-15 DIAGNOSIS — M4726 Other spondylosis with radiculopathy, lumbar region: Secondary | ICD-10-CM | POA: Diagnosis not present

## 2019-05-15 DIAGNOSIS — F3176 Bipolar disorder, in full remission, most recent episode depressed: Secondary | ICD-10-CM | POA: Diagnosis not present

## 2019-05-15 DIAGNOSIS — J9601 Acute respiratory failure with hypoxia: Secondary | ICD-10-CM | POA: Diagnosis not present

## 2019-05-15 DIAGNOSIS — R296 Repeated falls: Secondary | ICD-10-CM | POA: Diagnosis not present

## 2019-05-15 DIAGNOSIS — G4733 Obstructive sleep apnea (adult) (pediatric): Secondary | ICD-10-CM | POA: Diagnosis not present

## 2019-05-15 DIAGNOSIS — R2681 Unsteadiness on feet: Secondary | ICD-10-CM | POA: Diagnosis not present

## 2019-05-16 DIAGNOSIS — M4726 Other spondylosis with radiculopathy, lumbar region: Secondary | ICD-10-CM | POA: Diagnosis not present

## 2019-05-16 DIAGNOSIS — R296 Repeated falls: Secondary | ICD-10-CM | POA: Diagnosis not present

## 2019-05-16 DIAGNOSIS — J9601 Acute respiratory failure with hypoxia: Secondary | ICD-10-CM | POA: Diagnosis not present

## 2019-05-16 DIAGNOSIS — G4733 Obstructive sleep apnea (adult) (pediatric): Secondary | ICD-10-CM | POA: Diagnosis not present

## 2019-05-16 DIAGNOSIS — F3176 Bipolar disorder, in full remission, most recent episode depressed: Secondary | ICD-10-CM | POA: Diagnosis not present

## 2019-05-16 DIAGNOSIS — R2681 Unsteadiness on feet: Secondary | ICD-10-CM | POA: Diagnosis not present

## 2019-05-17 ENCOUNTER — Telehealth: Payer: Self-pay

## 2019-05-17 NOTE — Telephone Encounter (Signed)
Case manager Autumn Bowen from [facility] called requesting an order for home health care. Pt was seen at [facility] did not include details of the visit. She mentioned that the NP who Autumn Bowen was under the care of submitted an order to [facility] for home health care. But [facility] is refusing the order because it was written by a NP and orders are only accepted by a PA or Provider. Requesting if provider can send in an order to [facility] home [facility]. Autumn Bowen can be contacted at [phone] for further inquiries. Pls advise, thanks.

## 2019-05-17 NOTE — Telephone Encounter (Signed)
Contacted case Lawyer. Informed of provider's note. She completely understands provider's note and is agreeable with provider's recommendation. She will reach out to the patient to confirm current PCP. Jackelyn Poling will contact the office as needed with any updates. No other inquiries during call.

## 2019-05-17 NOTE — Telephone Encounter (Signed)
I was under the impression patient was not planning to keep me as PCP - she had fired me for Dr Georgina Snell and never kept a scheduled appt w/ me after he left the practice. I won't be responsible for home health at this time, sorry. Pt needs to confirm PCP, if she wants to see me that's fine I'll sign the orders, but if she's established elsewhere then the order needs to go there.

## 2019-05-20 ENCOUNTER — Telehealth: Payer: Self-pay | Admitting: Osteopathic Medicine

## 2019-05-20 NOTE — Telephone Encounter (Signed)
Id ask if shes ok to see Dr Georgina Snell She still has not confirmed if I am her new PCP or if she will be establishing with Dr Corey's replacement or seeking care elsewhere, can we confirm? Thanks!

## 2019-05-20 NOTE — Telephone Encounter (Signed)
Patient called and is having some issues with an appointment about her neurology referral and she is going to talk to Dr. Sabra Heck about.   She is having some pain in her left hip pain, left leg pain and lower back pain. She was offered an appointment with Dr. Darene Lamer and is not sure about this at this time. Do we need to offer an appointment with Dr. Georgina Snell so he can get further imaging done? Please advise.

## 2019-05-21 ENCOUNTER — Other Ambulatory Visit: Payer: Self-pay | Admitting: Osteopathic Medicine

## 2019-05-21 ENCOUNTER — Other Ambulatory Visit: Payer: Self-pay

## 2019-05-21 MED ORDER — FAMOTIDINE 20 MG PO TABS: ORAL_TABLET | ORAL | 0 refills | Status: DC

## 2019-05-22 NOTE — Telephone Encounter (Signed)
For sports medicine issues, I'd prefer her to see Dr Georgina Snell or Dr T For primary care, she needs a follow-up (OV40) sometime in the next month

## 2019-05-22 NOTE — Telephone Encounter (Signed)
Patient states that Dr.Alexander will be her PCP and she does not want anyone else. She thought Dr.Alexander was her PCP.

## 2019-05-22 NOTE — Telephone Encounter (Signed)
Appointment has been made

## 2019-05-28 DIAGNOSIS — Z20828 Contact with and (suspected) exposure to other viral communicable diseases: Secondary | ICD-10-CM | POA: Diagnosis not present

## 2019-05-28 DIAGNOSIS — R2689 Other abnormalities of gait and mobility: Secondary | ICD-10-CM | POA: Diagnosis not present

## 2019-05-28 DIAGNOSIS — M47816 Spondylosis without myelopathy or radiculopathy, lumbar region: Secondary | ICD-10-CM | POA: Diagnosis not present

## 2019-05-28 DIAGNOSIS — E119 Type 2 diabetes mellitus without complications: Secondary | ICD-10-CM | POA: Diagnosis not present

## 2019-05-28 DIAGNOSIS — R296 Repeated falls: Secondary | ICD-10-CM | POA: Diagnosis not present

## 2019-05-28 DIAGNOSIS — Z7984 Long term (current) use of oral hypoglycemic drugs: Secondary | ICD-10-CM | POA: Diagnosis not present

## 2019-05-28 DIAGNOSIS — F3189 Other bipolar disorder: Secondary | ICD-10-CM | POA: Diagnosis not present

## 2019-05-28 DIAGNOSIS — J449 Chronic obstructive pulmonary disease, unspecified: Secondary | ICD-10-CM | POA: Diagnosis not present

## 2019-05-28 DIAGNOSIS — G4733 Obstructive sleep apnea (adult) (pediatric): Secondary | ICD-10-CM | POA: Diagnosis not present

## 2019-06-04 DIAGNOSIS — J449 Chronic obstructive pulmonary disease, unspecified: Secondary | ICD-10-CM | POA: Diagnosis not present

## 2019-06-04 DIAGNOSIS — E119 Type 2 diabetes mellitus without complications: Secondary | ICD-10-CM | POA: Diagnosis not present

## 2019-06-04 DIAGNOSIS — R2689 Other abnormalities of gait and mobility: Secondary | ICD-10-CM | POA: Diagnosis not present

## 2019-06-04 DIAGNOSIS — R296 Repeated falls: Secondary | ICD-10-CM | POA: Diagnosis not present

## 2019-06-04 DIAGNOSIS — F3189 Other bipolar disorder: Secondary | ICD-10-CM | POA: Diagnosis not present

## 2019-06-04 DIAGNOSIS — G4733 Obstructive sleep apnea (adult) (pediatric): Secondary | ICD-10-CM | POA: Diagnosis not present

## 2019-06-04 DIAGNOSIS — M47816 Spondylosis without myelopathy or radiculopathy, lumbar region: Secondary | ICD-10-CM | POA: Diagnosis not present

## 2019-06-04 DIAGNOSIS — Z7984 Long term (current) use of oral hypoglycemic drugs: Secondary | ICD-10-CM | POA: Diagnosis not present

## 2019-06-04 DIAGNOSIS — Z20828 Contact with and (suspected) exposure to other viral communicable diseases: Secondary | ICD-10-CM | POA: Diagnosis not present

## 2019-06-10 DIAGNOSIS — E119 Type 2 diabetes mellitus without complications: Secondary | ICD-10-CM | POA: Diagnosis not present

## 2019-06-10 DIAGNOSIS — J449 Chronic obstructive pulmonary disease, unspecified: Secondary | ICD-10-CM | POA: Diagnosis not present

## 2019-06-10 DIAGNOSIS — G4733 Obstructive sleep apnea (adult) (pediatric): Secondary | ICD-10-CM | POA: Diagnosis not present

## 2019-06-10 DIAGNOSIS — Z7984 Long term (current) use of oral hypoglycemic drugs: Secondary | ICD-10-CM | POA: Diagnosis not present

## 2019-06-10 DIAGNOSIS — Z20828 Contact with and (suspected) exposure to other viral communicable diseases: Secondary | ICD-10-CM | POA: Diagnosis not present

## 2019-06-10 DIAGNOSIS — F3189 Other bipolar disorder: Secondary | ICD-10-CM | POA: Diagnosis not present

## 2019-06-10 DIAGNOSIS — M47816 Spondylosis without myelopathy or radiculopathy, lumbar region: Secondary | ICD-10-CM | POA: Diagnosis not present

## 2019-06-10 DIAGNOSIS — R296 Repeated falls: Secondary | ICD-10-CM | POA: Diagnosis not present

## 2019-06-10 DIAGNOSIS — R2689 Other abnormalities of gait and mobility: Secondary | ICD-10-CM | POA: Diagnosis not present

## 2019-06-12 DIAGNOSIS — R2681 Unsteadiness on feet: Secondary | ICD-10-CM | POA: Diagnosis not present

## 2019-06-12 DIAGNOSIS — R278 Other lack of coordination: Secondary | ICD-10-CM | POA: Diagnosis not present

## 2019-06-12 DIAGNOSIS — G63 Polyneuropathy in diseases classified elsewhere: Secondary | ICD-10-CM | POA: Diagnosis not present

## 2019-06-12 DIAGNOSIS — I951 Orthostatic hypotension: Secondary | ICD-10-CM | POA: Diagnosis not present

## 2019-06-14 DIAGNOSIS — E119 Type 2 diabetes mellitus without complications: Secondary | ICD-10-CM | POA: Diagnosis not present

## 2019-06-14 DIAGNOSIS — R296 Repeated falls: Secondary | ICD-10-CM | POA: Diagnosis not present

## 2019-06-14 DIAGNOSIS — M47816 Spondylosis without myelopathy or radiculopathy, lumbar region: Secondary | ICD-10-CM | POA: Diagnosis not present

## 2019-06-14 DIAGNOSIS — G4733 Obstructive sleep apnea (adult) (pediatric): Secondary | ICD-10-CM | POA: Diagnosis not present

## 2019-06-14 DIAGNOSIS — F3189 Other bipolar disorder: Secondary | ICD-10-CM | POA: Diagnosis not present

## 2019-06-14 DIAGNOSIS — R2689 Other abnormalities of gait and mobility: Secondary | ICD-10-CM | POA: Diagnosis not present

## 2019-06-14 DIAGNOSIS — J449 Chronic obstructive pulmonary disease, unspecified: Secondary | ICD-10-CM | POA: Diagnosis not present

## 2019-06-14 DIAGNOSIS — Z7984 Long term (current) use of oral hypoglycemic drugs: Secondary | ICD-10-CM | POA: Diagnosis not present

## 2019-06-14 DIAGNOSIS — Z20828 Contact with and (suspected) exposure to other viral communicable diseases: Secondary | ICD-10-CM | POA: Diagnosis not present

## 2019-06-18 ENCOUNTER — Other Ambulatory Visit: Payer: Self-pay | Admitting: Cardiology

## 2019-06-18 NOTE — Progress Notes (Deleted)
Subjective:   Autumn Bowen is a 74 y.o. female who presents for Medicare Annual (Subsequent) preventive examination.  Review of Systems:  No ROS.  Medicare Wellness Virtual Visit.  Visual/audio telehealth visit, UTA vital signs.   See social history for additional risk factors.      Sleep patterns:    Home Safety/Smoke Alarms: Feels safe in home. Smoke alarms in place.  Living environment;  Seat Belt Safety/Bike Helmet: Wears seat belt.   Female:   Pap-  Aged out     Mammo-       Dexa scan-        CCS- UTD     Objective:     Vitals: LMP 09/21/1974   There is no height or weight on file to calculate BMI.  Advanced Directives 12/07/2017 12/07/2017 11/29/2017 06/22/2016  Does Patient Have a Medical Advance Directive? No No Yes Yes  Type of Advance Directive - - Public librarian;Living will  Does patient want to make changes to medical advance directive? No - Patient declined No - Patient declined Yes (MAU/Ambulatory/Procedural Areas - Information given) Yes (MAU/Ambulatory/Procedural Areas - Information given)  Copy of [place] in Chart? - - - No - copy requested  Would patient like information on creating a medical advance directive? No - Patient declined No - Patient declined - -    Tobacco Social History   Tobacco Use  Smoking Status Current Some Day Smoker  . Packs/day: 0.25  . Years: 51.00  . Pack years: 12.75  . Types: Cigarettes  . Start date: 01/02/2012  Smokeless Tobacco Never Used  Tobacco Comment   Been down to 4 - 6 cigs a day     Ready to quit: Not Answered Counseling given: Not Answered Comment: Been down to 4 - 6 cigs a day   Clinical Intake:                       Past Medical History:  Diagnosis Date  . Allergy    Notes that grass and some trees causes eyes to sting, burn, and water.  . Anemia   . Celiac disease   . Celiac disease   . COPD (chronic obstructive pulmonary disease) ([place])   .  Diabetes mellitus type II   . Diverticulitis   . Dyslipidemia   . HOH (hard of hearing)   . Hypertension   . Hypothyroidism   . Sleep apnea   . Sore throat    Past Surgical History:  Procedure Laterality Date  . APPENDECTOMY    . BREAST SURGERY     h/o benign cystleft breast and lymp node removal on right breast area.  Marland Kitchen EYE SURGERY     Left eye cataract removal, right eye h/o macular degeneration.  . INTRAVASCULAR PRESSURE WIRE/FFR STUDY N/A 12/07/2017   Procedure: INTRAVASCULAR PRESSURE WIRE/FFR STUDY;  Surgeon: Leonie Man, MD;  Location: [facility];  Service: Cardiovascular;  Laterality: N/A;  . LEFT HEART CATH AND CORONARY ANGIOGRAPHY N/A 12/07/2017   Procedure: LEFT HEART CATH AND CORONARY ANGIOGRAPHY;  Surgeon: Leonie Man, MD;  Location: [facility];  Service: Cardiovascular;  Laterality: N/A;  . SPINE SURGERY     Pt not sure of the type of surgery she had but knows it was L4-5.  Marland Kitchen VAGINAL HYSTERECTOMY     Family History  Problem Relation Age of Onset  . Anxiety disorder Mother   .  Depression Mother   . Heart attack Mother   . Stroke Mother   . Diabetes type II Mother   . Diabetes Father   . Glaucoma Father   . Hypertension Father   . COPD Father   . COPD Sister   . Hashimoto's thyroiditis Sister   . Cancer Sister   . Kidney disease Sister   . Drug abuse Brother   . Alcohol abuse Brother   . Asthma Son   . Diabetes Son    Social History   Socioeconomic History  . Marital status: Divorced    Spouse name: Not on file  . Number of children: Not on file  . Years of education: Not on file  . Highest education level: Not on file  Occupational History  . Not on file  Tobacco Use  . Smoking status: Current Some Day Smoker    Packs/day: 0.25    Years: 51.00    Pack years: 12.75    Types: Cigarettes    Start date: 01/02/2012  . Smokeless tobacco: Never Used  . Tobacco comment: Been down to 4 - 6 cigs a day  Substance and Sexual  Activity  . Alcohol use: No  . Drug use: No  . Sexual activity: Not Currently  Other Topics Concern  . Not on file  Social History Narrative  . Not on file   Social Determinants of Health   Financial Resource Strain:   . Difficulty of Paying Living Expenses: Not on file  Food Insecurity:   . Worried About Charity fundraiser in the Last Year: Not on file  . Ran Out of Food in the Last Year: Not on file  Transportation Needs:   . Lack of Transportation (Medical): Not on file  . Lack of Transportation (Non-Medical): Not on file  Physical Activity:   . Days of Exercise per Week: Not on file  . Minutes of Exercise per Session: Not on file  Stress:   . Feeling of Stress : Not on file  Social Connections:   . Frequency of Communication with Friends and Family: Not on file  . Frequency of Social Gatherings with Friends and Family: Not on file  . Attends Religious Services: Not on file  . Active Member of Clubs or Organizations: Not on file  . Attends Archivist Meetings: Not on file  . Marital Status: Not on file    Outpatient Encounter Medications as of 06/25/2019  Medication Sig  . ACCU-CHEK AVIVA PLUS test strip TEST ONCE TO TWICE DAILY  . acetaminophen (TYLENOL) 500 MG tablet Take 1,000 mg by mouth every 6 (six) hours as needed for mild pain or moderate pain.  Marland Kitchen albuterol (PROVENTIL HFA;VENTOLIN HFA) 108 (90 Base) MCG/ACT inhaler Inhale 2 puffs into the lungs every 4 (four) hours as needed for wheezing.  . AMBULATORY NON FORMULARY MEDICATION Glucometer, test strips and lancets for testing once a day for DM, uncontrolled.  . AMBULATORY NON FORMULARY MEDICATION Knee-high, medium compression, graduated compression stockings. Apply to lower extremities. Disp per patient preference and insurance coverage.  . AMBULATORY NON FORMULARY MEDICATION Standard walker use as needed. R26.2 M54.9 Disp 1  . amitriptyline (ELAVIL) 50 MG tablet Take 1 tablet (50 mg total) by mouth at  bedtime. NEEDS APPT  . aspirin EC 81 MG tablet Take 1 tablet (81 mg total) by mouth daily.  . cholecalciferol (VITAMIN D) 1000 units tablet Take 1,000 Units by mouth daily.  . colchicine 0.6 MG tablet Take 1  tablet (0.6 mg total) by mouth daily.  . diclofenac sodium (VOLTAREN) 1 % GEL Apply 4 g topically 4 (four) times daily. To affected joint.  Marland Kitchen dicyclomine (BENTYL) 10 MG capsule Take 1 capsule (10 mg total) by mouth 2 (two) times daily.  . Dulaglutide (TRULICITY) 8.18 HU/3.1SH SOPN Inject 0.5 mLs into the skin once a week.  Marland Kitchen EPINEPHrine (EPIPEN 2-PAK) 0.3 mg/0.3 mL IJ SOAJ injection Inject 0.3 mLs (0.3 mg total) into the muscle as needed for anaphylaxis.  . famotidine (PEPCID) 20 MG tablet TAKE 1 TABLET(20 MG TOTAL) BY MOUTH DAILY.  Marland Kitchen Fluticasone-Salmeterol (ADVAIR DISKUS) 250-50 MCG/DOSE AEPB Inhale 1 puff into the lungs 2 (two) times daily.  Marland Kitchen gabapentin (NEURONTIN) 300 MG capsule TAKE 1 CAPSULE AT BEDTIME X 1 WEEK, 1 TWICE DAILY X 1 WEEK, THEN 1 THREE TIMES DAILY. MAY DOUBLE WEEKLY UP TO 3600MG/DAY AS DIRECTED  . GLUCOSAMINE-CHONDROITIN PO Take 1 tablet by mouth 2 (two) times daily.   Marland Kitchen HYDROcodone-acetaminophen (NORCO/VICODIN) 5-325 MG tablet Take 1 tablet by mouth every 6 (six) hours as needed.  . Insulin Degludec (TRESIBA FLEXTOUCH) 200 UNIT/ML SOPN Inject 28-60 Units into the skin at bedtime.  . Insulin Pen Needle (BD PEN NEEDLE NANO U/F) 32G X 4 MM MISC USE AS DIRECTED  . isosorbide mononitrate (IMDUR) 30 MG 24 hr tablet Take 1 tablet (30 mg total) by mouth daily. Need office visit for more refills  . JARDIANCE 10 MG TABS tablet TAKE 1 TABLET BY MOUTH DAILY  . levocetirizine (XYZAL) 5 MG tablet Take 5 mg by mouth every evening.  Marland Kitchen levothyroxine (SYNTHROID, LEVOTHROID) 150 MCG tablet TAKE 1 TABLET BY MOUTH DAILY BEFORE BREAKFAST  . LORazepam (ATIVAN) 0.5 MG tablet 1-2 tabs 30 - 60 min prior to MRI. Do not drive with this medicine.  Marland Kitchen LUMIGAN 0.01 % SOLN Instill 1 drop into both eyes  every night  . meclizine (ANTIVERT) 25 MG tablet Take 1 tablet (25 mg total) by mouth 3 (three) times daily.  . metFORMIN (GLUCOPHAGE-XR) 750 MG 24 hr tablet TAKE 1 TABLET(750 MG TOTAL) BY MOUTH DAILY WITH BREAKFAST.  . Multiple Vitamins-Minerals (PRESERVISION AREDS 2 PO) Take 1 capsule by mouth 2 (two) times daily.  . nitroGLYCERIN (NITROSTAT) 0.4 MG SL tablet Place 1 tablet (0.4 mg total) under the tongue every 5 (five) minutes as needed for chest pain.  Marland Kitchen ondansetron (ZOFRAN) 8 MG tablet TAKE 1 TABLET BY MOUTH EVERY 8 HOURS AS NEEDED FOR NAUSEA AND VOMITING  . pregabalin (LYRICA) 75 MG capsule TAKE ONE CAPSULE BY MOUTH THREE TIMES DAILY  . propranolol ER (INDERAL LA) 60 MG 24 hr capsule 75 mg. Pt reports recently increased dose  . QUEtiapine (SEROQUEL) 200 MG tablet TAKE 1 TABLET(200 MG) BY MOUTH AT BEDTIME  . rosuvastatin (CRESTOR) 40 MG tablet TAKE 1 TABLET(40 MG) BY MOUTH DAILY  . sertraline (ZOLOFT) 100 MG tablet Take 2 tablets (200 mg total) by mouth daily.   No facility-administered encounter medications on file as of 06/25/2019.    Activities of Daily Living No flowsheet data found.  Patient Care Team: Revankar, Reita Cliche, MD as PCP - Cardiology (Cardiology)    Assessment:   This is a routine wellness examination for Evagelia.Physical assessment deferred to PCP.   Exercise Activities and Dietary recommendations   Diet  Breakfast: Lunch:  Dinner:       Goals    . Prevent Falls (pt-stated)     She would like to see if she can obtain assistance  to have her knees, which are weak and unsteady, looked at.  She states she needs assistance with this.    . Quit Smoking       Fall Risk Fall Risk  10/04/2018 06/22/2016  Falls in the past year? - Yes  Comment - Has fallen at least 12 times, once a month, for the past year.  Number falls in past yr: - 2 or more  Injury with Fall? - Yes  Comment - Has fx. some ribs and sustained some bruising on knees   Risk Factor Category  - High  Fall Risk  Risk for fall due to : Impaired mobility History of fall(s);Impaired mobility;Impaired vision;Impaired balance/gait  Follow up - Falls prevention discussed;Education provided   Is the patient's home free of loose throw rugs in walkways, pet beds, electrical cords, etc?   {Blank single:19197::"yes","no"}      Grab bars in the bathroom? {Blank single:19197::"yes","no"}      Handrails on the stairs?   {Blank single:19197::"yes","no"}      Adequate lighting?   {Blank single:19197::"yes","no"}  Depression Screen PHQ 2/9 Scores 04/11/2018 10/17/2017 01/20/2017 09/20/2016  PHQ - 2 Score 0 2 3 3   PHQ- 9 Score 14 4 17 12      Cognitive Function     6CIT Screen 06/22/2016  What Year? 0 points  What month? 0 points  What time? 3 points  Count back from 20 0 points  Months in reverse 0 points  Repeat phrase 0 points  Total Score 3    Immunization History  Administered Date(s) Administered  . Influenza Whole 05/27/2009  . Influenza, High Dose Seasonal PF 01/20/2017, 02/21/2018  . Influenza,inj,Quad PF,6+ Mos 05/01/2015, 03/25/2016  . Influenza,trivalent, recombinat, inj, PF 04/06/2011, 03/16/2012  . Pneumococcal Conjugate-13 04/26/2010, 06/22/2016  . Pneumococcal Polysaccharide-23 05/01/2015  . Td 08/05/2004, 06/26/2007  . Tdap 07/20/2010    Screening Tests Health Maintenance  Topic Date Due  . OPHTHALMOLOGY EXAM  12/27/2014  . FOOT EXAM  02/22/2019  . URINE MICROALBUMIN  02/22/2019  . HEMOGLOBIN A1C  06/15/2019  . INFLUENZA VACCINE  08/28/2019 (Originally 12/29/2018)  . MAMMOGRAM  02/29/2020 (Originally 05/24/2018)  . TETANUS/TDAP  07/20/2020  . COLONOSCOPY  06/01/2021  . DEXA SCAN  Completed  . Hepatitis C Screening  Completed  . PNA vac Low Risk Adult  Completed      Plan:   ***   I have personally reviewed and noted the following in the patient's chart:   . Medical and social history . Use of alcohol, tobacco or illicit drugs  . Current medications and  supplements . Functional ability and status . Nutritional status . Physical activity . Advanced directives . List of other physicians . Hospitalizations, surgeries, and ER visits in previous 12 months . Vitals . Screenings to include cognitive, depression, and falls . Referrals and appointments  In addition, I have reviewed and discussed with patient certain preventive protocols, quality metrics, and best practice recommendations. A written personalized care plan for preventive services as well as general preventive health recommendations were provided to patient.     Joanne Chars, LPN  [id number]

## 2019-06-19 ENCOUNTER — Other Ambulatory Visit: Payer: Self-pay

## 2019-06-19 MED ORDER — MECLIZINE HCL 25 MG PO TABS: 25.0000 mg | ORAL_TABLET | Freq: Three times a day (TID) | ORAL | 0 refills | Status: DC

## 2019-06-20 ENCOUNTER — Emergency Department (INDEPENDENT_AMBULATORY_CARE_PROVIDER_SITE_OTHER): Payer: Medicare HMO

## 2019-06-20 ENCOUNTER — Emergency Department (INDEPENDENT_AMBULATORY_CARE_PROVIDER_SITE_OTHER)
Admission: EM | Admit: 2019-06-20 | Discharge: 2019-06-20 | Disposition: A | Discharge status: Home / Self Care | Payer: Medicare HMO | Source: Home / Self Care | Attending: Family Medicine | Admitting: Family Medicine

## 2019-06-20 ENCOUNTER — Telehealth: Payer: Self-pay

## 2019-06-20 DIAGNOSIS — R059 Cough, unspecified: Secondary | ICD-10-CM

## 2019-06-20 DIAGNOSIS — R509 Fever, unspecified: Secondary | ICD-10-CM | POA: Diagnosis not present

## 2019-06-20 DIAGNOSIS — R05 Cough: Secondary | ICD-10-CM

## 2019-06-20 MED ORDER — DOXYCYCLINE HYCLATE 100 MG PO CAPS: 100.0000 mg | ORAL_CAPSULE | Freq: Two times a day (BID) | ORAL | 0 refills | Status: DC

## 2019-06-20 NOTE — ED Provider Notes (Signed)
[facility]    CSN: [id number] Arrival date & time: 06/20/19  1543      History   Chief Complaint Chief Complaint  Patient presents with  . Cough    HPI Autumn Bowen is a 74 y.o. female.   Patient complains of low grade fever, sinus congestion, mild sore throat, myalgias, headache, occasional productive cough, and nausea for one week.  Last night she fell asleep on a sofa and awoke this morning with sweats.   She denies chest tightness, shortness of breath, and changes in taste/smell. Patient continues to smoke.  She has a past history of pneumonia.    The history is provided by the patient.    Past Medical History:  Diagnosis Date  . Allergy    Notes that grass and some trees causes eyes to sting, burn, and water.  . Anemia   . Celiac disease   . Celiac disease   . COPD (chronic obstructive pulmonary disease) ([place])   . Diabetes mellitus type II   . Diverticulitis   . Dyslipidemia   . HOH (hard of hearing)   . Hypertension   . Hypothyroidism   . Sleep apnea   . Sore throat     Patient Active Problem List   Diagnosis Date Noted  . Spinal stenosis of lumbosacral region 03/19/2019  . HTN (hypertension) 02/08/2019  . Injury of left knee 02/21/2018  . Microalbuminuria due to type 2 diabetes mellitus ([place]) 02/21/2018  . Hyperlipidemia associated with type 2 diabetes mellitus ([place]) 02/21/2018  . CAD (coronary artery disease) 01/01/2018  . Chronic pain syndrome 12/13/2017  . Angina pectoris ([place]) 12/07/2017  . Diabetes mellitus due to underlying condition with unspecified complications ([place]) 85/07/7739  . Radiographic dye allergy status 12/05/2017  . Pseudogout of left knee 11/16/2017  . Diverticulitis of colon 04/12/2016  . Broken or cracked tooth, nontraumatic 03/27/2016  . Memory deficit 05/05/2015  . Hearing difficulty of both ears 05/05/2015  . OSA (obstructive sleep apnea) 04/28/2015  . Seborrheic keratoses 01/31/2015  . Left-sided low back  pain with left-sided sciatica 01/31/2015  . GERD (gastroesophageal reflux disease) 01/26/2015  . Celiac disease 01/26/2015  . Cervical spondylosis 12/04/2014  . Osteoarthritis of first metatarsophalangeal joint 11/17/2014  . Seasonal allergies 10/04/2014  . Neuropathy 10/04/2014  . Toenail fungus 10/03/2014  . Adenomatous polyp of colon 07/30/2014  . Macular degeneration, dry 07/29/2014  . Type 2 diabetes mellitus with complication ([place]) 28/78/6767  . Migraine without aura and without status migrainosus, not intractable 07/28/2014  . COPD (chronic obstructive pulmonary disease) with chronic bronchitis ([place]) 05/10/2009  . VISION DISORDER 12/03/2008  . OSTEOPENIA 11/18/2008  . VITAMIN D DEFICIENCY 11/06/2008  . Major depressive disorder 11/05/2008  . FATIGUE 11/05/2008  . ANEMIA 10/23/2008  . DYSLIPIDEMIA 10/16/2008  . TOBACCO ABUSE 10/15/2008  . ALLERGIC RHINITIS 10/15/2008  . POST TRAUMATIC STRESS SYNDROME 05/30/2000  . Hypothyroidism 05/30/1988  . POSTMENOPAUSAL STATUS 05/30/1968    Past Surgical History:  Procedure Laterality Date  . APPENDECTOMY    . BREAST SURGERY     h/o benign cystleft breast and lymp node removal on right breast area.  Marland Kitchen EYE SURGERY     Left eye cataract removal, right eye h/o macular degeneration.  . INTRAVASCULAR PRESSURE WIRE/FFR STUDY N/A 12/07/2017   Procedure: INTRAVASCULAR PRESSURE WIRE/FFR STUDY;  Surgeon: Leonie Man, MD;  Location: [facility];  Service: Cardiovascular;  Laterality: N/A;  . LEFT HEART CATH AND CORONARY ANGIOGRAPHY N/A 12/07/2017  Procedure: LEFT HEART CATH AND CORONARY ANGIOGRAPHY;  Surgeon: Leonie Man, MD;  Location: [facility];  Service: Cardiovascular;  Laterality: N/A;  . SPINE SURGERY     Pt not sure of the type of surgery she had but knows it was L4-5.  Marland Kitchen VAGINAL HYSTERECTOMY         Home Medications    Prior to Admission medications   Medication Sig Start Date End Date Taking?  Authorizing Provider  ACCU-CHEK AVIVA PLUS test strip TEST ONCE TO TWICE DAILY 01/28/19   Gregor Hams, MD  acetaminophen (TYLENOL) 500 MG tablet Take 1,000 mg by mouth every 6 (six) hours as needed for mild pain or moderate pain.    [provider]  albuterol (PROVENTIL HFA;VENTOLIN HFA) 108 (90 Base) MCG/ACT inhaler Inhale 2 puffs into the lungs every 4 (four) hours as needed for wheezing. 07/23/18   Emeterio Reeve, DO  AMBULATORY NON FORMULARY MEDICATION Glucometer, test strips and lancets for testing once a day for DM, uncontrolled. 10/04/14   Breeback, Jade L, PA-C  AMBULATORY NON FORMULARY MEDICATION Knee-high, medium compression, graduated compression stockings. Apply to lower extremities. Disp per patient preference and insurance coverage. 05/24/18   Emeterio Reeve, DO  AMBULATORY NON FORMULARY MEDICATION Standard walker use as needed. R26.2 M54.9 Disp 1 08/24/18   Gregor Hams, MD  amitriptyline (ELAVIL) 50 MG tablet Take 1 tablet (50 mg total) by mouth at bedtime. NEEDS APPT 05/21/19   Emeterio Reeve, DO  aspirin EC 81 MG tablet Take 1 tablet (81 mg total) by mouth daily. 12/05/17   Revankar, Reita Cliche, MD  cholecalciferol (VITAMIN D) 1000 units tablet Take 1,000 Units by mouth daily.    [provider]  colchicine 0.6 MG tablet Take 1 tablet (0.6 mg total) by mouth daily. 08/15/18   Emeterio Reeve, DO  diclofenac sodium (VOLTAREN) 1 % GEL Apply 4 g topically 4 (four) times daily. To affected joint. 04/03/18   Gregor Hams, MD  dicyclomine (BENTYL) 10 MG capsule Take 1 capsule (10 mg total) by mouth 2 (two) times daily. 12/13/18   Emeterio Reeve, DO  doxycycline (VIBRAMYCIN) 100 MG capsule Take 1 capsule (100 mg total) by mouth 2 (two) times daily. Take with food. 06/20/19   Kandra Nicolas, MD  Dulaglutide (TRULICITY) 4.65 KC/1.2XN SOPN Inject 0.5 mLs into the skin once a week. 01/09/19   Gregor Hams, MD  EPINEPHrine (EPIPEN 2-PAK) 0.3 mg/0.3 mL IJ SOAJ  injection Inject 0.3 mLs (0.3 mg total) into the muscle as needed for anaphylaxis. 07/31/18   Emeterio Reeve, DO  famotidine (PEPCID) 20 MG tablet TAKE 1 TABLET(20 MG TOTAL) BY MOUTH DAILY. 05/21/19   Emeterio Reeve, DO  Fluticasone-Salmeterol (ADVAIR DISKUS) 250-50 MCG/DOSE AEPB Inhale 1 puff into the lungs 2 (two) times daily. 03/18/19   Gregor Hams, MD  gabapentin (NEURONTIN) 300 MG capsule TAKE 1 CAPSULE AT BEDTIME X 1 WEEK, 1 TWICE DAILY X 1 WEEK, THEN 1 THREE TIMES DAILY. MAY DOUBLE WEEKLY UP TO 3600MG/DAY AS DIRECTED 01/08/19   Gregor Hams, MD  GLUCOSAMINE-CHONDROITIN PO Take 1 tablet by mouth 2 (two) times daily.     [provider]  HYDROcodone-acetaminophen (NORCO/VICODIN) 5-325 MG tablet Take 1 tablet by mouth every 6 (six) hours as needed. 02/26/19   Gregor Hams, MD  Insulin Degludec (TRESIBA FLEXTOUCH) 200 UNIT/ML SOPN Inject 28-60 Units into the skin at bedtime. 10/29/18   Emeterio Reeve, DO  Insulin Pen Needle (BD  PEN NEEDLE NANO U/F) 32G X 4 MM MISC USE AS DIRECTED 12/13/18   Emeterio Reeve, DO  isosorbide mononitrate (IMDUR) 30 MG 24 hr tablet Take 1 tablet (30 mg total) by mouth daily. Need office visit for more refills 05/08/19   Revankar, Reita Cliche, MD  JARDIANCE 10 MG TABS tablet TAKE 1 TABLET BY MOUTH DAILY 02/14/19   Emeterio Reeve, DO  levocetirizine (XYZAL) 5 MG tablet Take 5 mg by mouth every evening.    [provider]  levothyroxine (SYNTHROID, LEVOTHROID) 150 MCG tablet TAKE 1 TABLET BY MOUTH DAILY BEFORE BREAKFAST 07/23/18   Emeterio Reeve, DO  LORazepam (ATIVAN) 0.5 MG tablet 1-2 tabs 30 - 60 min prior to MRI. Do not drive with this medicine. 11/21/18   Gregor Hams, MD  LUMIGAN 0.01 % SOLN Instill 1 drop into both eyes every night 08/17/17   [provider]  meclizine (ANTIVERT) 25 MG tablet Take 1 tablet (25 mg total) by mouth 3 (three) times daily. 06/19/19   Emeterio Reeve, DO  metFORMIN (GLUCOPHAGE-XR) 750 MG 24 hr  tablet TAKE 1 TABLET(750 MG TOTAL) BY MOUTH DAILY WITH BREAKFAST. 07/24/18   Emeterio Reeve, DO  Multiple Vitamins-Minerals (PRESERVISION AREDS 2 PO) Take 1 capsule by mouth 2 (two) times daily.    [provider]  nitroGLYCERIN (NITROSTAT) 0.4 MG SL tablet Place 1 tablet (0.4 mg total) under the tongue every 5 (five) minutes as needed for chest pain. 01/01/19 04/01/19  Gregor Hams, MD  ondansetron (ZOFRAN) 8 MG tablet TAKE 1 TABLET BY MOUTH EVERY 8 HOURS AS NEEDED FOR NAUSEA AND VOMITING 04/06/16   Breeback, Jade L, PA-C  pregabalin (LYRICA) 75 MG capsule TAKE ONE CAPSULE BY MOUTH THREE TIMES DAILY 04/16/19   Silverio Decamp, MD  propranolol ER (INDERAL LA) 60 MG 24 hr capsule 75 mg. Pt reports recently increased dose 03/15/12   [provider]  QUEtiapine (SEROQUEL) 200 MG tablet TAKE 1 TABLET(200 MG) BY MOUTH AT BEDTIME 03/19/19   Gregor Hams, MD  rosuvastatin (CRESTOR) 40 MG tablet TAKE 1 TABLET(40 MG) BY MOUTH DAILY 03/11/19   Lelon Perla, MD  sertraline (ZOLOFT) 100 MG tablet Take 2 tablets (200 mg total) by mouth daily. 03/19/19   Gregor Hams, MD    Family History Family History  Problem Relation Age of Onset  . Anxiety disorder Mother   . Depression Mother   . Heart attack Mother   . Stroke Mother   . Diabetes type II Mother   . Diabetes Father   . Glaucoma Father   . Hypertension Father   . COPD Father   . COPD Sister   . Hashimoto's thyroiditis Sister   . Cancer Sister   . Kidney disease Sister   . Drug abuse Brother   . Alcohol abuse Brother   . Asthma Son   . Diabetes Son     Social History Social History   Tobacco Use  . Smoking status: Current Some Day Smoker    Packs/day: 0.25    Years: 51.00    Pack years: 12.75    Types: Cigarettes    Start date: 01/02/2012  . Smokeless tobacco: Never Used  . Tobacco comment: Been down to 4 - 6 cigs a day  Substance Use Topics  . Alcohol use: No  . Drug use: No     Allergies   Bee  venom, Iodinated diagnostic agents, Penicillins, Povidone iodine, Tomato, Chocolate, Latex, Other, Gluten meal, Zolpidem tartrate,  Atorvastatin calcium, Esomeprazole magnesium, Metformin and related, Quetiapine fumarate, and Trazodone and nefazodone   Review of Systems Review of Systems + sore throat + occasional productive cough No pleuritic pain No wheezing + nasal congestion + post-nasal drainage No sinus pain/pressure No itchy/red eyes No earache No hemoptysis No SOB No fever, + chills/sweats + nausea No vomiting No abdominal pain No diarrhea No urinary symptoms No skin rash + fatigue + myalgias + headache   Physical Exam Triage Vital Signs ED Triage Vitals  Enc Vitals Group     BP 06/20/19 1724 (!) 145/75     Pulse Rate 06/20/19 1724 (!) 103     Resp 06/20/19 1724 20     Temp 06/20/19 1724 98.8 F (37.1 C)     Temp Source 06/20/19 1724 Oral     SpO2 06/20/19 1724 93 %     Weight 06/20/19 1729 216 lb (98 kg)     Height 06/20/19 1729 5' 3"  (1.6 m)     Head Circumference --      Peak Flow --      Pain Score 06/20/19 1728 8     Pain Loc --      Pain Edu? --      Excl. in [place]? --    No data found.  Updated Vital Signs BP (!) 145/75 (BP Location: Left Arm)   Pulse (!) 103   Temp 98.8 F (37.1 C) (Oral)   Resp 20   Ht 5' 3"  (1.6 m)   Wt 98 kg   LMP 09/21/1974   SpO2 93%   BMI 38.26 kg/m   Visual Acuity Right Eye Distance:   Left Eye Distance:   Bilateral Distance:    Right Eye Near:   Left Eye Near:    Bilateral Near:     Physical Exam Nursing notes and Vital Signs reviewed. Appearance:  Patient appears stated age, and in no acute distress.  She walks with an unsteady gait. Eyes:  Pupils are equal, round, and reactive to light and accomodation.  Extraocular movement is intact.  Conjunctivae are not inflamed  Ears:  Canals normal.  Tympanic membranes normal.  Nose:  Mildly congested turbinates.  No sinus tenderness. Pharynx:  Normal Neck:   Supple.  Mildly enlarged lateral nodes are present, tender to palpation bilaterally  Lungs:  Clear to auscultation.  Breath sounds are equal.  Moving air well. Heart:  Regular rate and rhythm without murmurs, rubs, or gallops.  Abdomen:  Nontender without masses or hepatosplenomegaly.  Bowel sounds are present.  No CVA or flank tenderness.  Extremities:  No edema.  Skin:  No rash present.   UC Treatments / Results  Labs (all labs ordered are listed, but only abnormal results are displayed) Labs Reviewed  NOVEL CORONAVIRUS, NAA    EKG   Radiology DG Chest 2 View  Result Date: 06/20/2019 CLINICAL DATA:  Low-grade fever. EXAM: CHEST - 2 VIEW COMPARISON:  November 27, 2017 FINDINGS: Mild, stable, chronic appearing increased interstitial lung markings are seen. There is no evidence of acute infiltrate, pleural effusion or pneumothorax. The heart size and mediastinal contours are within normal limits. Degenerative changes seen within the thoracic spine. IMPRESSION: No active cardiopulmonary disease. Electronically Signed   By: Virgina Norfolk M.D.   On: 06/20/2019 18:44    Procedures Procedures (including critical care time)  Medications Ordered in UC Medications - No data to display  Initial Impression / Assessment and Plan / UC Course  I have reviewed  the triage vital signs and the nursing notes.  Pertinent labs & imaging results that were available during my care of the patient were reviewed by me and considered in my medical decision making (see chart for details).    Negative chest x-ray reassuring. Suspect developing viral URI.  Because of patient's multiple co-morbidities, smoking history, and past history of pneumonia, will begin empiric doxycycline. COVID19 send out.   Final Clinical Impressions(s) / UC Diagnoses   Final diagnoses:  Cough     Discharge Instructions     Take plain guaifenesin (1270m extended release tabs such as Mucinex) twice daily, with plenty of  water, for cough and congestion.  Get adequate rest.    Also recommend using saline nasal spray several times daily and saline nasal irrigation (AYR is a common brand).   Try warm salt water gargles for sore throat.  Stop all antihistamines for now, and other non-prescription cough/cold preparations.   Isolate yourself until COVID-19 test result is available.   If your COVID19 test is positive, then you are infected with the novel coronavirus and could give the virus to others.  Please continue isolation at home for at least 10 days since the start of your symptoms.  Once you complete your 10 day quarantine, you may return to normal activities as long as you've not had a fever for over 24 hours (without taking fever reducing medicine) and your symptoms are improving. Please continue good preventive care measures, including:  frequent hand-washing, avoid touching your face, cover coughs/sneezes, stay out of crowds and keep a 6 foot distance from others.  Go to the nearest hospital emergency room if fever/cough/breathlessness are severe or illness seems like a threat to life.     ED Prescriptions    Medication Sig Dispense Auth. Provider   doxycycline (VIBRAMYCIN) 100 MG capsule Take 1 capsule (100 mg total) by mouth 2 (two) times daily. Take with food. 14 capsule BKandra Nicolas MD        BKandra Nicolas MD 06/22/19 [phone]

## 2019-06-20 NOTE — ED Triage Notes (Signed)
Pt states that she fell asleep on a sofa last night and woke up sweating.  Pt states that she did not take her temp.  Pt is having body aches.  Coughing at times, and productive when she coughs. Lives in a [place] complex and they are strict about who enters.  They are not allowed to visit, or sit and talk to each other.

## 2019-06-20 NOTE — Telephone Encounter (Signed)
Autumn Bowen called and states she has body aches, fever and fatigue. Advised her to go to the urgent care. We do not have any openings for the rest of the day.

## 2019-06-20 NOTE — Discharge Instructions (Addendum)
Take plain guaifenesin (1275m extended release tabs such as Mucinex) twice daily, with plenty of water, for cough and congestion.  Get adequate rest.    Also recommend using saline nasal spray several times daily and saline nasal irrigation (AYR is a common brand).   Try warm salt water gargles for sore throat.  Stop all antihistamines for now, and other non-prescription cough/cold preparations.   Isolate yourself until COVID-19 test result is available.   If your COVID19 test is positive, then you are infected with the novel coronavirus and could give the virus to others.  Please continue isolation at home for at least 10 days since the start of your symptoms.  Once you complete your 10 day quarantine, you may return to normal activities as long as you've not had a fever for over 24 hours (without taking fever reducing medicine) and your symptoms are improving. Please continue good preventive care measures, including:  frequent hand-washing, avoid touching your face, cover coughs/sneezes, stay out of crowds and keep a 6 foot distance from others.  Go to the nearest hospital emergency room if fever/cough/breathlessness are severe or illness seems like a threat to life.

## 2019-06-21 ENCOUNTER — Other Ambulatory Visit: Payer: Self-pay | Admitting: Sports Medicine

## 2019-06-21 DIAGNOSIS — G629 Polyneuropathy, unspecified: Secondary | ICD-10-CM

## 2019-06-21 NOTE — Telephone Encounter (Signed)
I have not seen her in over a year, PCP may feel comfortable taking over this rx, if not, she will need an appointment with me.

## 2019-06-22 LAB — NOVEL CORONAVIRUS, NAA: SARS-CoV-2, NAA: NOT DETECTED

## 2019-06-24 ENCOUNTER — Ambulatory Visit: Payer: Medicare HMO | Admitting: Osteopathic Medicine

## 2019-06-24 NOTE — Telephone Encounter (Signed)
No-showed most recent appt w/ me Not sure if I'm her PCP again she was under Dr Corey's care Please confirm patient's plans to reestablish with me vs seek care elsewhere Either way I'm ok to refill 30 days once we know her plans

## 2019-07-03 NOTE — Telephone Encounter (Signed)
Has appointment with you in the morning at 7:30am. KG LPN

## 2019-07-04 ENCOUNTER — Encounter: Payer: Self-pay | Admitting: Osteopathic Medicine

## 2019-07-04 ENCOUNTER — Ambulatory Visit (INDEPENDENT_AMBULATORY_CARE_PROVIDER_SITE_OTHER): Payer: Medicare HMO | Admitting: Osteopathic Medicine

## 2019-07-04 VITALS — BP 142/77 | HR 98 | Temp 97.2°F | Wt 216.0 lb

## 2019-07-04 DIAGNOSIS — R29898 Other symptoms and signs involving the musculoskeletal system: Secondary | ICD-10-CM | POA: Diagnosis not present

## 2019-07-04 DIAGNOSIS — E1165 Type 2 diabetes mellitus with hyperglycemia: Secondary | ICD-10-CM | POA: Diagnosis not present

## 2019-07-04 DIAGNOSIS — R2681 Unsteadiness on feet: Secondary | ICD-10-CM | POA: Diagnosis not present

## 2019-07-04 DIAGNOSIS — M4807 Spinal stenosis, lumbosacral region: Secondary | ICD-10-CM | POA: Diagnosis not present

## 2019-07-04 NOTE — Progress Notes (Signed)
Virtual Visit via Phone  I connected with      Autumn Bowen on 07/04/19 at 8:33 AM  by a telemedicine application and verified that I am speaking with the correct person using two identifiers.  Patient is at home I am in office   I discussed the limitations of evaluation and management by telemedicine and the availability of in person appointments. The patient expressed understanding and agreed to proceed.  History of Present Illness: Autumn Bowen is a 74 y.o. female who would like to discuss falling    Patient is concerned about frequent falls, frustrated that there does not appear to be any firm diagnosis.  She is currently following with neurology, per their last note there is some concern for orthostatic hypotension precluding use of dopamine agonists, she had some significant orthostatic hypotension in their office.  Patient also has a history of polio, lumbar and cervical degenerative disc disease, poorly controlled diabetes with likely diabetic neuropathy.     Observations/Objective: BP (!) 142/77   Pulse 98   Temp (!) 97.2 F (36.2 C) (Oral)   Wt 216 lb (98 kg)   LMP 09/21/1974   BMI 38.26 kg/m  BP Readings from Last 3 Encounters:  07/04/19 (!) 142/77  06/20/19 (!) 145/75  05/02/19 97/62   Exam: Normal Speech.  Lab and Radiology Results No results found for this or any previous visit (from the past 72 hour(s)). No results found.     Assessment and Plan: 74 y.o. female with The primary encounter diagnosis was Gait instability. Diagnoses of Spinal stenosis of lumbosacral region, Weakness of left lower extremity, and Uncontrolled type 2 diabetes mellitus with hyperglycemia ([place]) were also pertinent to this visit.  I explained to patient that falls were likely multifactorial, it does not sound like neurology wants to try any further medications until patient has undergone more extensive physical therapy and we get blood pressure up a bit.  Neurosurgery notes  are not particularly detailed but from my understanding there is nothing we can really offer in terms of surgical interventions.  We will have patient into the office sometime in the next couple of weeks, she would like to bring her home medications and home blood pressure monitor in for [place] to look over everything.  We will schedule her for 40-minute visit.  In the meantime, will reestablish home health with physical therapy and I think occupational therapy would also be helpful here.      Follow Up Instructions: Return for OV 40, bring pill bottles and home blood pressure machine to review medications/verify home BP.    I discussed the assessment and treatment plan with the patient. The patient was provided an opportunity to ask questions and all were answered. The patient agreed with the plan and demonstrated an understanding of the instructions.   The patient was advised to call back or seek an in-person evaluation if any new concerns, if symptoms worsen or if the condition fails to improve as anticipated.  40 minutes of non-face-to-face time was provided during this encounter.      . . . . . . . . . . . . . Marland Kitchen                   Historical information moved to improve visibility of documentation.  Past Medical History:  Diagnosis Date  . Allergy    Notes that grass and some trees causes eyes to sting, burn, and water.  . Anemia   .  Celiac disease   . Celiac disease   . COPD (chronic obstructive pulmonary disease) ([place])   . Diabetes mellitus type II   . Diverticulitis   . Dyslipidemia   . HOH (hard of hearing)   . Hypertension   . Hypothyroidism   . Sleep apnea   . Sore throat    Past Surgical History:  Procedure Laterality Date  . APPENDECTOMY    . BREAST SURGERY     h/o benign cystleft breast and lymp node removal on right breast area.  Marland Kitchen EYE SURGERY     Left eye cataract removal, right eye h/o macular degeneration.  . INTRAVASCULAR  PRESSURE WIRE/FFR STUDY N/A 12/07/2017   Procedure: INTRAVASCULAR PRESSURE WIRE/FFR STUDY;  Surgeon: Leonie Man, MD;  Location: [facility];  Service: Cardiovascular;  Laterality: N/A;  . LEFT HEART CATH AND CORONARY ANGIOGRAPHY N/A 12/07/2017   Procedure: LEFT HEART CATH AND CORONARY ANGIOGRAPHY;  Surgeon: Leonie Man, MD;  Location: [facility];  Service: Cardiovascular;  Laterality: N/A;  . SPINE SURGERY     Pt not sure of the type of surgery she had but knows it was L4-5.  Marland Kitchen VAGINAL HYSTERECTOMY     Social History   Tobacco Use  . Smoking status: Current Some Day Smoker    Packs/day: 0.25    Years: 51.00    Pack years: 12.75    Types: Cigarettes    Start date: 01/02/2012  . Smokeless tobacco: Never Used  . Tobacco comment: Been down to 4 - 6 cigs a day  Substance Use Topics  . Alcohol use: No   family history includes Alcohol abuse in her brother; Anxiety disorder in her mother; Asthma in her son; COPD in her father and sister; Cancer in her sister; Depression in her mother; Diabetes in her father and son; Diabetes type II in her mother; Drug abuse in her brother; Glaucoma in her father; Hashimoto's thyroiditis in her sister; Heart attack in her mother; Hypertension in her father; Kidney disease in her sister; Stroke in her mother.  Medications: Current Outpatient Medications  Medication Sig Dispense Refill  . ACCU-CHEK AVIVA PLUS test strip TEST ONCE TO TWICE DAILY 150 strip 2  . acetaminophen (TYLENOL) 500 MG tablet Take 1,000 mg by mouth every 6 (six) hours as needed for mild pain or moderate pain.    Marland Kitchen albuterol (PROVENTIL HFA;VENTOLIN HFA) 108 (90 Base) MCG/ACT inhaler Inhale 2 puffs into the lungs every 4 (four) hours as needed for wheezing. 2 Inhaler 11  . AMBULATORY NON FORMULARY MEDICATION Glucometer, test strips and lancets for testing once a day for DM, uncontrolled. 100 strip 0  . AMBULATORY NON FORMULARY MEDICATION Knee-high, medium compression,  graduated compression stockings. Apply to lower extremities. Disp per patient preference and insurance coverage. 10 each prn  . AMBULATORY NON FORMULARY MEDICATION Standard walker use as needed. R26.2 M54.9 Disp 1 1 Units prn  . amitriptyline (ELAVIL) 50 MG tablet Take 1 tablet (50 mg total) by mouth at bedtime. NEEDS APPT 90 tablet 0  . aspirin EC 81 MG tablet Take 1 tablet (81 mg total) by mouth daily. 90 tablet 3  . cholecalciferol (VITAMIN D) 1000 units tablet Take 1,000 Units by mouth daily.    . colchicine 0.6 MG tablet Take 1 tablet (0.6 mg total) by mouth daily. 90 tablet 3  . diclofenac sodium (VOLTAREN) 1 % GEL Apply 4 g topically 4 (four) times daily. To affected joint. 100 g 11  .  dicyclomine (BENTYL) 10 MG capsule Take 1 capsule (10 mg total) by mouth 2 (two) times daily. 180 capsule 3  . doxycycline (VIBRAMYCIN) 100 MG capsule Take 1 capsule (100 mg total) by mouth 2 (two) times daily. Take with food. 14 capsule 0  . EPINEPHrine (EPIPEN 2-PAK) 0.3 mg/0.3 mL IJ SOAJ injection Inject 0.3 mLs (0.3 mg total) into the muscle as needed for anaphylaxis. 1 Device prn  . famotidine (PEPCID) 20 MG tablet TAKE 1 TABLET(20 MG TOTAL) BY MOUTH DAILY. 90 tablet 0  . Fluticasone-Salmeterol (ADVAIR DISKUS) 250-50 MCG/DOSE AEPB Inhale 1 puff into the lungs 2 (two) times daily. 60 each 12  . gabapentin (NEURONTIN) 300 MG capsule TAKE 1 CAPSULE AT BEDTIME X 1 WEEK, 1 TWICE DAILY X 1 WEEK, THEN 1 THREE TIMES DAILY. MAY DOUBLE WEEKLY UP TO 3600MG/DAY AS DIRECTED 180 capsule 3  . GLUCOSAMINE-CHONDROITIN PO Take 1 tablet by mouth 2 (two) times daily.     Marland Kitchen HYDROcodone-acetaminophen (NORCO/VICODIN) 5-325 MG tablet Take 1 tablet by mouth every 6 (six) hours as needed. 15 tablet 0  . Insulin Degludec (TRESIBA FLEXTOUCH) 200 UNIT/ML SOPN Inject 28-60 Units into the skin at bedtime. 15 mL 99  . Insulin Pen Needle (BD PEN NEEDLE NANO U/F) 32G X 4 MM MISC USE AS DIRECTED 100 each 99  . isosorbide mononitrate  (IMDUR) 30 MG 24 hr tablet Take 1 tablet (30 mg total) by mouth daily. Need office visit for more refills 90 tablet 0  . JARDIANCE 10 MG TABS tablet TAKE 1 TABLET BY MOUTH DAILY 90 tablet 1  . levocetirizine (XYZAL) 5 MG tablet Take 5 mg by mouth every evening.    Marland Kitchen levothyroxine (SYNTHROID, LEVOTHROID) 150 MCG tablet TAKE 1 TABLET BY MOUTH DAILY BEFORE BREAKFAST 90 tablet 3  . LORazepam (ATIVAN) 0.5 MG tablet 1-2 tabs 30 - 60 min prior to MRI. Do not drive with this medicine. 4 tablet 0  . LUMIGAN 0.01 % SOLN Instill 1 drop into both eyes every night  3  . meclizine (ANTIVERT) 25 MG tablet Take 1 tablet (25 mg total) by mouth 3 (three) times daily. 90 tablet 0  . metFORMIN (GLUCOPHAGE-XR) 750 MG 24 hr tablet TAKE 1 TABLET(750 MG TOTAL) BY MOUTH DAILY WITH BREAKFAST. 90 tablet 1  . Multiple Vitamins-Minerals (PRESERVISION AREDS 2 PO) Take 1 capsule by mouth 2 (two) times daily.    . ondansetron (ZOFRAN) 8 MG tablet TAKE 1 TABLET BY MOUTH EVERY 8 HOURS AS NEEDED FOR NAUSEA AND VOMITING 20 tablet 2  . pregabalin (LYRICA) 75 MG capsule TAKE ONE CAPSULE BY MOUTH THREE TIMES DAILY 90 capsule 0  . propranolol ER (INDERAL LA) 60 MG 24 hr capsule 75 mg. Pt reports recently increased dose    . QUEtiapine (SEROQUEL) 200 MG tablet TAKE 1 TABLET(200 MG) BY MOUTH AT BEDTIME 90 tablet 0  . rosuvastatin (CRESTOR) 40 MG tablet TAKE 1 TABLET(40 MG) BY MOUTH DAILY 90 tablet 0  . sertraline (ZOLOFT) 100 MG tablet Take 2 tablets (200 mg total) by mouth daily. 120 tablet 3  . nitroGLYCERIN (NITROSTAT) 0.4 MG SL tablet Place 1 tablet (0.4 mg total) under the tongue every 5 (five) minutes as needed for chest pain. 25 tablet 11   No current facility-administered medications for this visit.   Allergies  Allergen Reactions  . Bee Venom Anaphylaxis  . Iodinated Diagnostic Agents Other (See Comments)    Per patient cardiac arrest IVP DYE-CARDIAC ARREST  . Penicillins Anaphylaxis  Has patient had a PCN reaction  causing immediate rash, facial/tongue/throat swelling, SOB or lightheadedness with hypotension: yes Has patient had a PCN reaction causing severe rash involving mucus membranes or skin necrosis: no Has patient had a PCN reaction that required hospitalization: yes Has patient had a PCN reaction occurring within the last 10 years: no If all of the above answers are "NO", then may proceed with Cephalosporin use.   . Povidone Iodine Itching and Rash  . Tomato Itching  . Chocolate Rash  . Latex Rash  . Other Rash  . Quetiapine Fumarate Other (See Comments)    Per pt she can not take XR Per pt she can not take XR  . Trazodone And Nefazodone Hives  . Zolpidem Tartrate Nausea And Vomiting  . Gluten Meal   . Zolpidem Tartrate Nausea And Vomiting  . Atorvastatin Calcium Nausea Only  . Esomeprazole Magnesium Nausea Only  . Metformin And Related Diarrhea

## 2019-07-10 ENCOUNTER — Telehealth: Payer: Self-pay

## 2019-07-10 DIAGNOSIS — Z20828 Contact with and (suspected) exposure to other viral communicable diseases: Secondary | ICD-10-CM | POA: Diagnosis not present

## 2019-07-10 DIAGNOSIS — J449 Chronic obstructive pulmonary disease, unspecified: Secondary | ICD-10-CM | POA: Diagnosis not present

## 2019-07-10 DIAGNOSIS — R2689 Other abnormalities of gait and mobility: Secondary | ICD-10-CM | POA: Diagnosis not present

## 2019-07-10 DIAGNOSIS — Z7984 Long term (current) use of oral hypoglycemic drugs: Secondary | ICD-10-CM | POA: Diagnosis not present

## 2019-07-10 DIAGNOSIS — M47816 Spondylosis without myelopathy or radiculopathy, lumbar region: Secondary | ICD-10-CM | POA: Diagnosis not present

## 2019-07-10 DIAGNOSIS — E119 Type 2 diabetes mellitus without complications: Secondary | ICD-10-CM | POA: Diagnosis not present

## 2019-07-10 DIAGNOSIS — G4733 Obstructive sleep apnea (adult) (pediatric): Secondary | ICD-10-CM | POA: Diagnosis not present

## 2019-07-10 DIAGNOSIS — R296 Repeated falls: Secondary | ICD-10-CM | POA: Diagnosis not present

## 2019-07-10 DIAGNOSIS — F3189 Other bipolar disorder: Secondary | ICD-10-CM | POA: Diagnosis not present

## 2019-07-10 NOTE — Telephone Encounter (Signed)
[facility] pharmacy requesting med refill for isosorbide mononitrate. Rx written by external provider. Pls advise, thanks.

## 2019-07-11 MED ORDER — ISOSORBIDE MONONITRATE ER 30 MG PO TB24: 30.0000 mg | ORAL_TABLET | Freq: Every day | ORAL | 0 refills | Status: DC

## 2019-07-11 NOTE — Telephone Encounter (Signed)
30 sent Per Rx hx needs visit w/ her cardiologist for more refills She needs to arrange f/u w/ Dr Julien Nordmann office

## 2019-07-12 NOTE — Telephone Encounter (Signed)
Pt advised.

## 2019-07-15 ENCOUNTER — Encounter: Payer: Self-pay | Admitting: Osteopathic Medicine

## 2019-07-15 ENCOUNTER — Ambulatory Visit (INDEPENDENT_AMBULATORY_CARE_PROVIDER_SITE_OTHER): Payer: Medicare HMO | Admitting: Osteopathic Medicine

## 2019-07-15 ENCOUNTER — Other Ambulatory Visit: Payer: Self-pay

## 2019-07-15 VITALS — BP 93/59 | HR 88 | Temp 98.3°F | Wt 202.1 lb

## 2019-07-15 DIAGNOSIS — G629 Polyneuropathy, unspecified: Secondary | ICD-10-CM

## 2019-07-15 DIAGNOSIS — E1165 Type 2 diabetes mellitus with hyperglycemia: Secondary | ICD-10-CM

## 2019-07-15 DIAGNOSIS — G894 Chronic pain syndrome: Secondary | ICD-10-CM

## 2019-07-15 DIAGNOSIS — M4807 Spinal stenosis, lumbosacral region: Secondary | ICD-10-CM

## 2019-07-15 DIAGNOSIS — Z79899 Other long term (current) drug therapy: Secondary | ICD-10-CM

## 2019-07-15 DIAGNOSIS — I2510 Atherosclerotic heart disease of native coronary artery without angina pectoris: Secondary | ICD-10-CM | POA: Diagnosis not present

## 2019-07-15 LAB — POCT GLYCOSYLATED HEMOGLOBIN (HGB A1C): Hemoglobin A1C: 7.7 % — AB (ref 4.0–5.6)

## 2019-07-15 NOTE — Progress Notes (Signed)
Autumn Bowen is a 74 y.o. female who presents to  [place] at [facility]  today, 07/15/19, seeking care for the following:  The primary encounter diagnosis was Polypharmacy. Diagnoses of Uncontrolled type 2 diabetes mellitus with hyperglycemia ([place]), Coronary artery disease involving native coronary artery of native heart without angina pectoris, Neuropathy, Chronic pain syndrome, and Spinal stenosis of lumbosacral region were also pertinent to this visit.       ASSESSMENT & PLAN with other pertinent history/findings:  1.  Controlled type 2 diabetes mellitus with hyperglycemia (HCC) A1C acceptable range for age/comorbids No hypoglycemia   2. Coronary artery disease involving native coronary artery of native heart without angina pectoris cotninue preventive meds  3. Neuropathy Neuro notes reviewed, likely multifactoral  4. Chronic pain syndrome Multifactorial and contributing to falls, however meds to help pain can also certainly be contributing to balance issues / falls / confusion.   5. Spinal stenosis of lumbosacral region Not good surgical candidate  6. Polypharmacy Extensive time spent reviewing meds and seeing which ones are needed / helpful / harmful / optional See pt isntructions below   Review of falls/MSK/Neuro   Gait instability - multifactorial   Neurology: following w/ Dr Sabra Heck, last noted 06/12/2019  Pt unable to tolerate full EMG, but evidence pointed to L5 lumbosacral radiculopathy, +/- mechanical trauma; no eivdence of widespread peripheral neuropathy  Suspicion for [facility] Disease/Dementia   Orthopedic: lumbar DDD, chronic LBP - L4L5 Laminectomy w/ decompression  Orthostatic hypotension (Neuro reluctant to add dopamine agonist given he rlow BP)  DM Neuropathy possible, EMG can miss small fiber nruoptahy   Debility/deconditioning: needs PT     Orders Placed This Encounter  Procedures  .  Ambulatory referral to Home Health  . POCT HgB A1C    No orders of the defined types were placed in this encounter.   Patient Instructions  DAILY MEDICINES:   Heart:  Aspirin  Rosuvastatin  Isosorbide Diabetes:  Metformin  Jardiance  Insulin  Vitamins:   Multivitamin  Vitamin D   Potassium  Biotin  PreserVision  Glucosamine Mental Health:   Sertaline   Quetiapine Stomach:   Famotidine  Dicyclomine Lungs:   Fluticasone-Salmeterol inhaler Pain:  Gabapentin  Amitriptyline    AS-NEEDED:  Lungs:   Albuterol inhaler  Dizziness:  Meclizine  Chest pain:   Nitroglycerin    STOP: Losartan For one week  Will call you in 2 weeks Might stop Amitriptyline at that time        Follow-up instructions: Return in about 2 weeks (around 07/29/2019) for VIRTUAL VISIT (PHONE) CHECK ON MEDICAITONS / DIZZINESS / FALLS .         LMP 09/21/1974   No outpatient medications have been marked as taking for the 07/15/19 encounter (Appointment) with Emeterio Reeve, DO.    No results found for this or any previous visit (from the past 72 hour(s)).  No results found.  Depression screen [facility] 2/9 04/11/2018 10/17/2017 01/20/2017  Decreased Interest 0 2 0  Down, Depressed, Hopeless 0 0 3  PHQ - 2 Score 0 2 3  Altered sleeping 3 0 3  Tired, decreased energy 3 2 3   Change in appetite 3 0 3  Feeling bad or failure about yourself  3 0 3  Trouble concentrating 2 0 1  Moving slowly or fidgety/restless 0 0 1  Suicidal thoughts 0 0 0  PHQ-9 Score 14 4 17   Difficult doing work/chores Very difficult Very  difficult -  Some recent data might be hidden    GAD 7 : Generalized Anxiety Score 04/11/2018 10/17/2017  Nervous, Anxious, on Edge 3 0  Control/stop worrying 3 2  Worry too much - different things 0 2  Trouble relaxing 3 0  Restless 0 0  Easily annoyed or irritable 0 0  Afraid - awful might happen 0 0  Total GAD 7 Score 9 4  Anxiety Difficulty Very difficult Very  difficult      All questions at time of visit were answered - patient instructed to contact office with any additional concerns or updates.  ER/RTC precautions were reviewed with the patient.  Please note: voice recognition software was used to produce this document, and typos may escape review. Please contact Dr. Sheppard Coil for any needed clarifications.   Total encounter time: 40 minutes.

## 2019-07-15 NOTE — Patient Instructions (Addendum)
DAILY MEDICINES:   Heart:  Aspirin  Rosuvastatin  Isosorbide Diabetes:  Metformin  Jardiance  Insulin  Vitamins:   Multivitamin  Vitamin D   Potassium  Biotin  PreserVision  Glucosamine Mental Health:   Sertaline   Quetiapine Stomach:   Famotidine  Dicyclomine Lungs:   Fluticasone-Salmeterol inhaler Pain:  Gabapentin  Amitriptyline    AS-NEEDED:  Lungs:   Albuterol inhaler  Dizziness:  Meclizine  Chest pain:   Nitroglycerin    STOP: Losartan For one week  Will call you in 2 weeks Might stop Amitriptyline at that time

## 2019-07-19 DIAGNOSIS — M47816 Spondylosis without myelopathy or radiculopathy, lumbar region: Secondary | ICD-10-CM | POA: Diagnosis not present

## 2019-07-19 DIAGNOSIS — F3189 Other bipolar disorder: Secondary | ICD-10-CM | POA: Diagnosis not present

## 2019-07-19 DIAGNOSIS — R296 Repeated falls: Secondary | ICD-10-CM | POA: Diagnosis not present

## 2019-07-19 DIAGNOSIS — G4733 Obstructive sleep apnea (adult) (pediatric): Secondary | ICD-10-CM | POA: Diagnosis not present

## 2019-07-19 DIAGNOSIS — Z7984 Long term (current) use of oral hypoglycemic drugs: Secondary | ICD-10-CM | POA: Diagnosis not present

## 2019-07-19 DIAGNOSIS — Z20828 Contact with and (suspected) exposure to other viral communicable diseases: Secondary | ICD-10-CM | POA: Diagnosis not present

## 2019-07-19 DIAGNOSIS — R2689 Other abnormalities of gait and mobility: Secondary | ICD-10-CM | POA: Diagnosis not present

## 2019-07-19 DIAGNOSIS — J449 Chronic obstructive pulmonary disease, unspecified: Secondary | ICD-10-CM | POA: Diagnosis not present

## 2019-07-19 DIAGNOSIS — E119 Type 2 diabetes mellitus without complications: Secondary | ICD-10-CM | POA: Diagnosis not present

## 2019-08-09 ENCOUNTER — Other Ambulatory Visit: Payer: Self-pay

## 2019-08-09 MED ORDER — QUETIAPINE FUMARATE 200 MG PO TABS: ORAL_TABLET | ORAL | 0 refills | Status: DC

## 2019-08-09 MED ORDER — MECLIZINE HCL 25 MG PO TABS: 25.0000 mg | ORAL_TABLET | Freq: Three times a day (TID) | ORAL | 3 refills | Status: DC

## 2019-08-14 DIAGNOSIS — G63 Polyneuropathy in diseases classified elsewhere: Secondary | ICD-10-CM | POA: Diagnosis not present

## 2019-08-14 DIAGNOSIS — R278 Other lack of coordination: Secondary | ICD-10-CM | POA: Diagnosis not present

## 2019-08-14 DIAGNOSIS — R2681 Unsteadiness on feet: Secondary | ICD-10-CM | POA: Diagnosis not present

## 2019-08-14 DIAGNOSIS — M5417 Radiculopathy, lumbosacral region: Secondary | ICD-10-CM | POA: Diagnosis not present

## 2019-08-14 DIAGNOSIS — I951 Orthostatic hypotension: Secondary | ICD-10-CM | POA: Diagnosis not present

## 2019-08-23 ENCOUNTER — Other Ambulatory Visit: Payer: Self-pay

## 2019-08-23 DIAGNOSIS — E118 Type 2 diabetes mellitus with unspecified complications: Secondary | ICD-10-CM

## 2019-08-23 DIAGNOSIS — Z794 Long term (current) use of insulin: Secondary | ICD-10-CM

## 2019-08-23 MED ORDER — FAMOTIDINE 20 MG PO TABS: ORAL_TABLET | ORAL | 1 refills | Status: DC

## 2019-08-23 MED ORDER — LEVOTHYROXINE SODIUM 150 MCG PO TABS: ORAL_TABLET | ORAL | 0 refills | Status: DC

## 2019-08-23 MED ORDER — AMITRIPTYLINE HCL 50 MG PO TABS: 50.0000 mg | ORAL_TABLET | Freq: Every day | ORAL | 1 refills | Status: DC

## 2019-08-23 MED ORDER — METFORMIN HCL ER 750 MG PO TB24: ORAL_TABLET | ORAL | 1 refills | Status: DC

## 2019-08-23 MED ORDER — JARDIANCE 10 MG PO TABS: 10.0000 mg | ORAL_TABLET | Freq: Every day | ORAL | 1 refills | Status: DC

## 2019-08-28 ENCOUNTER — Encounter: Payer: Self-pay | Admitting: Family Medicine

## 2019-08-28 ENCOUNTER — Telehealth: Payer: Self-pay | Admitting: Osteopathic Medicine

## 2019-08-28 ENCOUNTER — Ambulatory Visit (INDEPENDENT_AMBULATORY_CARE_PROVIDER_SITE_OTHER): Payer: Medicare HMO | Admitting: Family Medicine

## 2019-08-28 VITALS — BP 106/69 | HR 72 | Temp 98.0°F | Ht 63.0 in | Wt 196.0 lb

## 2019-08-28 DIAGNOSIS — R300 Dysuria: Secondary | ICD-10-CM | POA: Diagnosis not present

## 2019-08-28 DIAGNOSIS — R350 Frequency of micturition: Secondary | ICD-10-CM | POA: Diagnosis not present

## 2019-08-28 DIAGNOSIS — M4807 Spinal stenosis, lumbosacral region: Secondary | ICD-10-CM

## 2019-08-28 DIAGNOSIS — M545 Low back pain, unspecified: Secondary | ICD-10-CM

## 2019-08-28 LAB — POCT URINALYSIS DIP (CLINITEK)
Bilirubin, UA: NEGATIVE
Blood, UA: NEGATIVE
Glucose, UA: >=1000 mg/dL — AB
Ketones, POC UA: NEGATIVE mg/dL
Leukocytes, UA: NEGATIVE
Nitrite, UA: NEGATIVE
POC PROTEIN,UA: NEGATIVE
Spec Grav, UA: 1.015 (ref 1.010–1.025)
Urobilinogen, UA: 0.2 E.U./dL
pH, UA: 6 (ref 5.0–8.0)

## 2019-08-28 MED ORDER — MELOXICAM 15 MG PO TABS: 15.0000 mg | ORAL_TABLET | Freq: Every day | ORAL | 0 refills | Status: DC

## 2019-08-28 MED ORDER — GABAPENTIN 300 MG PO CAPS: ORAL_CAPSULE | ORAL | 3 refills | Status: DC

## 2019-08-28 NOTE — Progress Notes (Signed)
Autumn Bowen - 74 y.o. female MRN [id number]  Date of birth: 04-17-1946  Subjective Chief Complaint  Patient presents with  . Back Pain    HPI Autumn Bowen is a 74 y.o. female with history of mutliple medical problems including HTN, CAD, COPD, T2DM, Hypothyroidism, chronic low back pain with sciatica, and  Neuropathy.  She is here today for an acute visit with complaint of back pain and dysuria. She reports that pain is similar to chronic pain just a bit worse.  She denies increased radiation of pain.  She does have some dysuria as well. She has had urinary  frequency but denies hematuria.  She is taking gabapentin 373m tid.  She also has rx for elvavil, she thinks she is taking this at bedtime but isn't certain.  She has tried ibuprofen 8020mand this has been helpful.   ROS:  A comprehensive ROS was completed and negative except as noted per HPI  Allergies  Allergen Reactions  . Bee Venom Anaphylaxis  . Iodinated Diagnostic Agents Other (See Comments)    Per patient cardiac arrest IVP DYE-CARDIAC ARREST  . Penicillins Anaphylaxis    Has patient had a PCN reaction causing immediate rash, facial/tongue/throat swelling, SOB or lightheadedness with hypotension: yes Has patient had a PCN reaction causing severe rash involving mucus membranes or skin necrosis: no Has patient had a PCN reaction that required hospitalization: yes Has patient had a PCN reaction occurring within the last 10 years: no If all of the above answers are "NO", then may proceed with Cephalosporin use.   . Povidone Iodine Itching and Rash  . Tomato Itching  . Chocolate Rash  . Latex Rash  . Other Rash  . Quetiapine Fumarate Other (See Comments)    Per pt she can not take XR   . Trazodone And Nefazodone Hives  . Zolpidem Tartrate Nausea And Vomiting  . Gluten Meal   . Zolpidem Tartrate Nausea And Vomiting  . Atorvastatin Calcium Nausea Only  . Esomeprazole Magnesium Nausea Only  . Metformin And Related  Diarrhea    Past Medical History:  Diagnosis Date  . Allergy    Notes that grass and some trees causes eyes to sting, burn, and water.  . Anemia   . Celiac disease   . Celiac disease   . COPD [place]  . Diabetes mellitus type II   . Diverticulitis   . Dyslipidemia   . HOH (hard of hearing)   . Hypertension   . Hypothyroidism   . Sleep apnea   . Sore throat     Past Surgical History:  Procedure Laterality Date  . APPENDECTOMY    . BREAST SURGERY     h/o benign cystleft breast and lymp node removal on right breast area.  . Marland KitchenYE SURGERY     Left eye cataract removal, right eye h/o macular degeneration.  . INTRAVASCULAR PRESSURE WIRE/FFR STUDY N/A 12/07/2017   Procedure: INTRAVASCULAR PRESSURE WIRE/FFR STUDY;  Surgeon: HaLeonie ManMD;  [facility];  Service: Cardiovascular;  Laterality: N/A;  . LEFT HEART CATH AND CORONARY ANGIOGRAPHY N/A 12/07/2017   Procedure: LEFT HEART CATH AND CORONARY ANGIOGRAPHY;  Surgeon: HaLeonie ManMD;  [facility];  Service: Cardiovascular;  Laterality: N/A;  . SPINE SURGERY     Pt not sure of the type of surgery she had but knows it was L4-5.  . Marland KitchenAGINAL HYSTERECTOMY  Social History   Socioeconomic History  . Marital status: Divorced    Spouse name: Not on file  . Number of children: Not on file  . Years of education: Not on file  . Highest education level: Not on file  Occupational History  . Not on file  Tobacco Use  . Smoking status: Current Some Day Smoker    Packs/day: 0.25    Years: 51.00    Pack years: 12.75    Types: Cigarettes    Start date: 01/02/2012  . Smokeless tobacco: Never Used  . Tobacco comment: Been down to 4 - 6 cigs a day  Substance and Sexual Activity  . Alcohol use: No  . Drug use: No  . Sexual activity: Not Currently  Other Topics Concern  . Not on file  Social History Narrative  . Not on file   Social Determinants of  Health   Financial Resource Strain:   . Difficulty of Paying Living Expenses:   Food Insecurity:   . Worried About Charity fundraiser in the Last Year:   . Arboriculturist in the Last Year:   Transportation Needs:   . Film/video editor (Medical):   Marland Kitchen Lack of Transportation (Non-Medical):   Physical Activity:   . Days of Exercise per Week:   . Minutes of Exercise per Session:   Stress:   . Feeling of Stress :   Social Connections:   . Frequency of Communication with Friends and Family:   . Frequency of Social Gatherings with Friends and Family:   . Attends Religious Services:   . Active Member of Clubs or Organizations:   . Attends Archivist Meetings:   Marland Kitchen Marital Status:     Family History  Problem Relation Age of Onset  . Anxiety disorder Mother   . Depression Mother   . Heart attack Mother   . Stroke Mother   . Diabetes type II Mother   . Diabetes Father   . Glaucoma Father   . Hypertension Father   . COPD Father   . COPD Sister   . Hashimoto's thyroiditis Sister   . Cancer Sister   . Kidney disease Sister   . Drug abuse Brother   . Alcohol abuse Brother   . Asthma Son   . Diabetes Son     Health Maintenance  Topic Date Due  . OPHTHALMOLOGY EXAM  12/27/2014  . FOOT EXAM  02/22/2019  . URINE MICROALBUMIN  02/22/2019  . INFLUENZA VACCINE  08/28/2019 (Originally 12/29/2018)  . MAMMOGRAM  02/29/2020 (Originally 05/24/2018)  . HEMOGLOBIN A1C  01/12/2020  . TETANUS/TDAP  07/20/2020  . COLONOSCOPY  06/01/2021  . DEXA SCAN  Completed  . Hepatitis C Screening  Completed  . PNA vac Low Risk Adult  Completed     ----------------------------------------------------------------------------------------------------------------------------------------------------------------------------------------------------------------- Physical Exam BP 106/69   Pulse 72   Temp 98 F (36.7 C) (Oral)   Ht 5' 3"  (1.6 m)   Wt 196 lb (88.9 kg)   LMP 09/21/1974    BMI 34.72 kg/m   Physical Exam Constitutional:      Appearance: Normal appearance.  HENT:     Head: Normocephalic and atraumatic.  Eyes:     General: No scleral icterus. Cardiovascular:     Rate and Rhythm: Normal rate and regular rhythm.  Pulmonary:     Effort: Pulmonary effort is normal.     Breath sounds: Normal breath sounds.  Abdominal:     General: Abdomen  is flat. There is no distension.     Palpations: Abdomen is soft.     Tenderness: There is no right CVA tenderness or left CVA tenderness.  Skin:    General: Skin is warm and dry.  Neurological:     Mental Status: She is alert.  Psychiatric:        Mood and Affect: Mood normal.        Behavior: Behavior normal.     ------------------------------------------------------------------------------------------------------------------------------------------------------------------------------------------------------------------- Assessment and Plan  Spinal stenosis of lumbosacral region Increase gabapentin to 617m tid.  If not obtaining any relief with this she may increase to 9037mtid. Adding meloxicam daily as needed.  Continue elavil at bedtime as well.   Dysuria UA without significant abnormality. Will send for culture given current complaints.     Meds ordered this encounter  Medications  . gabapentin (NEURONTIN) 300 MG capsule    Sig: Take 60026mID.  May increase to 900m84mD if needed.    Dispense:  180 capsule    Refill:  3  . DISCONTD: meloxicam (MOBIC) 15 MG tablet    Sig: Take 1 tablet (15 mg total) by mouth daily. Take with food    Dispense:  30 tablet    Refill:  0  . meloxicam (MOBIC) 15 MG tablet    Sig: Take 1 tablet (15 mg total) by mouth daily. Take with food    Dispense:  90 tablet    Refill:  0    No follow-ups on file.    This visit occurred during the SARS-CoV-2 public health emergency.  Safety protocols were in place, including screening questions prior to the visit, additional  usage of staff PPE, and extensive cleaning of exam room while observing appropriate contact time as indicated for disinfecting solutions.

## 2019-08-28 NOTE — Patient Instructions (Addendum)
Increase you gabapentin to 658m three times per day.  You can increase further to 9059mthree times per day Add meloxicam daily as needed for pain and arthritis We'll be in touch with urine results.

## 2019-08-28 NOTE — Telephone Encounter (Signed)
This would be the third/fourth time patient has transferred providers (saw Luvenia Starch, then fired [place] for me, then fired me for Dr Georgina Snell, then back to me when Dr Georgina Snell left and is now firing me again). I feel this is not appropriate, but I will leave the decision to Dr Zigmund Daniel.

## 2019-08-28 NOTE — Telephone Encounter (Signed)
Patient wanted to have Dr.Matthews as her PCP. States she doesn't want to see Dr.Alexander. Please advise if this is okay.

## 2019-08-29 ENCOUNTER — Encounter: Payer: Self-pay | Admitting: Family Medicine

## 2019-08-29 DIAGNOSIS — R300 Dysuria: Secondary | ICD-10-CM | POA: Insufficient documentation

## 2019-08-29 LAB — URINE CULTURE
MICRO NUMBER:: 10313892
SPECIMEN QUALITY:: ADEQUATE

## 2019-08-29 LAB — URINALYSIS, MICROSCOPIC ONLY
Bacteria, UA: NONE SEEN /HPF
Hyaline Cast: NONE SEEN /LPF

## 2019-08-29 NOTE — Assessment & Plan Note (Signed)
UA without significant abnormality. Will send for culture given current complaints.

## 2019-08-29 NOTE — Telephone Encounter (Signed)
I would agree that I don't think change is appropriate.

## 2019-08-29 NOTE — Assessment & Plan Note (Signed)
Increase gabapentin to 625m tid.  If not obtaining any relief with this she may increase to 9056mtid. Adding meloxicam daily as needed.  Continue elavil at bedtime as well.

## 2019-08-29 NOTE — Telephone Encounter (Signed)
Left patient a voicemail with information below. Let patient know to call us back if they have any questions.

## 2019-09-17 DIAGNOSIS — H43812 Vitreous degeneration, left eye: Secondary | ICD-10-CM | POA: Diagnosis not present

## 2019-09-17 DIAGNOSIS — H524 Presbyopia: Secondary | ICD-10-CM | POA: Diagnosis not present

## 2019-09-17 DIAGNOSIS — H2512 Age-related nuclear cataract, left eye: Secondary | ICD-10-CM | POA: Diagnosis not present

## 2019-09-17 DIAGNOSIS — H353132 Nonexudative age-related macular degeneration, bilateral, intermediate dry stage: Secondary | ICD-10-CM | POA: Diagnosis not present

## 2019-09-17 DIAGNOSIS — R296 Repeated falls: Secondary | ICD-10-CM

## 2019-09-17 DIAGNOSIS — E119 Type 2 diabetes mellitus without complications: Secondary | ICD-10-CM | POA: Diagnosis not present

## 2019-09-17 DIAGNOSIS — Z961 Presence of intraocular lens: Secondary | ICD-10-CM | POA: Diagnosis not present

## 2019-09-17 DIAGNOSIS — H40053 Ocular hypertension, bilateral: Secondary | ICD-10-CM | POA: Diagnosis not present

## 2019-09-17 DIAGNOSIS — Z794 Long term (current) use of insulin: Secondary | ICD-10-CM | POA: Diagnosis not present

## 2019-09-17 LAB — HM DIABETES EYE EXAM

## 2019-09-24 ENCOUNTER — Other Ambulatory Visit: Payer: Self-pay

## 2019-09-24 MED ORDER — ISOSORBIDE MONONITRATE ER 30 MG PO TB24: 30.0000 mg | ORAL_TABLET | Freq: Every day | ORAL | 0 refills | Status: DC

## 2019-10-04 ENCOUNTER — Encounter: Payer: Self-pay | Admitting: Osteopathic Medicine

## 2019-10-21 NOTE — Progress Notes (Signed)
HPI: Follow-up coronary artery disease. Previously followed by Dr. [name]. Abdominal CT November 2017 showed atherosclerosis but no aneurysm. Patient had cardiac catheterization July 2019 due to CP.There was a 60% mid right coronary artery lesion with FFR of 0.94. No other coronary disease noted. Carotid Dopplers January 2020 showed 1 to 39% right and near normal left.  Since last seen,  she does have some dyspnea on exertion with more vigorous activities.  She denies orthopnea, PND or pedal edema.  She has not had any chest pain since we saw her last time.  Current Outpatient Medications  Medication Sig Dispense Refill  . acetaminophen (TYLENOL) 500 MG tablet Take 1,000 mg by mouth every 6 (six) hours as needed for mild pain or moderate pain.    Marland Kitchen albuterol (PROVENTIL HFA;VENTOLIN HFA) 108 (90 Base) MCG/ACT inhaler Inhale 2 puffs into the lungs every 4 (four) hours as needed for wheezing. 2 Inhaler 11  . aspirin EC 81 MG tablet Take 1 tablet (81 mg total) by mouth daily. 90 tablet 3  . cholecalciferol (VITAMIN D) 1000 units tablet Take 1,000 Units by mouth daily.    . colchicine 0.6 MG tablet Take 1 tablet (0.6 mg total) by mouth daily. 90 tablet 3  . diclofenac sodium (VOLTAREN) 1 % GEL Apply 4 g topically 4 (four) times daily. To affected joint. 100 g 11  . dicyclomine (BENTYL) 10 MG capsule Take 1 capsule (10 mg total) by mouth 2 (two) times daily. 180 capsule 3  . empagliflozin (JARDIANCE) 10 MG TABS tablet Take 10 mg by mouth daily. 90 tablet 1  . EPINEPHrine (EPIPEN 2-PAK) 0.3 mg/0.3 mL IJ SOAJ injection Inject 0.3 mLs (0.3 mg total) into the muscle as needed for anaphylaxis. 1 Device prn  . famotidine (PEPCID) 20 MG tablet TAKE 1 TABLET(20 MG TOTAL) BY MOUTH DAILY. 90 tablet 1  . Fluticasone-Salmeterol (ADVAIR DISKUS) 250-50 MCG/DOSE AEPB Inhale 1 puff into the lungs 2 (two) times daily. 60 each 12  . gabapentin (NEURONTIN) 300 MG capsule Take 619m TID.  May increase to  9060mTID if needed. 180 capsule 3  . GLUCOSAMINE-CHONDROITIN PO Take 1 tablet by mouth 2 (two) times daily.     . Insulin Degludec (TRESIBA FLEXTOUCH) 200 UNIT/ML SOPN Inject 28-60 Units into the skin at bedtime. 15 mL 99  . isosorbide mononitrate (IMDUR) 30 MG 24 hr tablet Take 1 tablet (30 mg total) by mouth daily. Need office visit w/ cardiology for more refills 30 tablet 0  . levothyroxine (SYNTHROID) 150 MCG tablet TAKE 1 TABLET BY MOUTH DAILY BEFORE BREAKFAST 30 tablet 0  . LUMIGAN 0.01 % SOLN Instill 1 drop into both eyes every night  3  . meclizine (ANTIVERT) 25 MG tablet Take 1 tablet (25 mg total) by mouth 3 (three) times daily. 90 tablet 3  . meloxicam (MOBIC) 15 MG tablet Take 1 tablet (15 mg total) by mouth daily. Take with food 90 tablet 0  . metFORMIN (GLUCOPHAGE-XR) 750 MG 24 hr tablet Take one tablet by mouth daily with breakfast. 90 tablet 1  . Multiple Vitamins-Minerals (PRESERVISION AREDS 2 PO) Take 1 capsule by mouth 2 (two) times daily.    . nitroGLYCERIN (NITROSTAT) 0.4 MG SL tablet Place 1 tablet (0.4 mg total) under the tongue every 5 (five) minutes as needed for chest pain. 25 tablet 11  . QUEtiapine (SEROQUEL) 200 MG tablet TAKE 1 TABLET(200 MG) BY MOUTH AT BEDTIME 90 tablet 0  . rosuvastatin (CRESTOR) 40  MG tablet TAKE 1 TABLET(40 MG) BY MOUTH DAILY 90 tablet 0  . sertraline (ZOLOFT) 100 MG tablet Take 2 tablets (200 mg total) by mouth daily. 120 tablet 3   No current facility-administered medications for this visit.     Past Medical History:  Diagnosis Date  . Allergy    Notes that grass and some trees causes eyes to sting, burn, and water.  . Anemia   . Celiac disease   . Celiac disease   . COPD (chronic obstructive pulmonary disease) ([place])   . Diabetes mellitus type II   . Diverticulitis   . Dyslipidemia   . HOH (hard of hearing)   . Hypertension   . Hypothyroidism   . Sleep apnea   . Sore throat     Past Surgical History:  Procedure Laterality  Date  . APPENDECTOMY    . BREAST SURGERY     h/o benign cystleft breast and lymp node removal on right breast area.  Marland Kitchen EYE SURGERY     Left eye cataract removal, right eye h/o macular degeneration.  . INTRAVASCULAR PRESSURE WIRE/FFR STUDY N/A 12/07/2017   Procedure: INTRAVASCULAR PRESSURE WIRE/FFR STUDY;  Surgeon: Leonie Man, MD;  Location: [facility];  Service: Cardiovascular;  Laterality: N/A;  . LEFT HEART CATH AND CORONARY ANGIOGRAPHY N/A 12/07/2017   Procedure: LEFT HEART CATH AND CORONARY ANGIOGRAPHY;  Surgeon: Leonie Man, MD;  Location: [facility];  Service: Cardiovascular;  Laterality: N/A;  . SPINE SURGERY     Pt not sure of the type of surgery she had but knows it was L4-5.  Marland Kitchen VAGINAL HYSTERECTOMY      Social History   Socioeconomic History  . Marital status: Divorced    Spouse name: Not on file  . Number of children: Not on file  . Years of education: Not on file  . Highest education level: Not on file  Occupational History  . Not on file  Tobacco Use  . Smoking status: Current Some Day Smoker    Packs/day: 0.25    Years: 51.00    Pack years: 12.75    Types: Cigarettes    Start date: 01/02/2012  . Smokeless tobacco: Never Used  . Tobacco comment: Been down to 4 - 6 cigs a day  Substance and Sexual Activity  . Alcohol use: No  . Drug use: No  . Sexual activity: Not Currently  Other Topics Concern  . Not on file  Social History Narrative  . Not on file   Social Determinants of Health   Financial Resource Strain:   . Difficulty of Paying Living Expenses:   Food Insecurity:   . Worried About Charity fundraiser in the Last Year:   . Arboriculturist in the Last Year:   Transportation Needs:   . Film/video editor (Medical):   Marland Kitchen Lack of Transportation (Non-Medical):   Physical Activity:   . Days of Exercise per Week:   . Minutes of Exercise per Session:   Stress:   . Feeling of Stress :   Social Connections:   . Frequency of  Communication with Friends and Family:   . Frequency of Social Gatherings with Friends and Family:   . Attends Religious Services:   . Active Member of Clubs or Organizations:   . Attends Archivist Meetings:   Marland Kitchen Marital Status:   Intimate Partner Violence:   . Fear of Current or Ex-Partner:   . Emotionally  Abused:   Marland Kitchen Physically Abused:   . Sexually Abused:     Family History  Problem Relation Age of Onset  . Anxiety disorder Mother   . Depression Mother   . Heart attack Mother   . Stroke Mother   . Diabetes type II Mother   . Diabetes Father   . Glaucoma Father   . Hypertension Father   . COPD Father   . COPD Sister   . Hashimoto's thyroiditis Sister   . Cancer Sister   . Kidney disease Sister   . Drug abuse Brother   . Alcohol abuse Brother   . Asthma Son   . Diabetes Son     ROS: Pain in hips and legs but no fevers or chills, productive cough, hemoptysis, dysphasia, odynophagia, melena, hematochezia, dysuria, hematuria, rash, seizure activity, orthopnea, PND, pedal edema, claudication. Remaining systems are negative.  Physical Exam: Well-developed well-nourished in no acute distress.  Skin is warm and dry.  HEENT is normal.  Neck is supple.  Chest is clear to auscultation with normal expansion.  Cardiovascular exam is regular rate and rhythm.  Abdominal exam nontender or distended. No masses palpated. Extremities show no edema. neuro grossly intact  ECG-normal sinus rhythm at a rate of 86, no ST changes.  Personally reviewed  A/P  1 coronary artery disease-patient has a history of atypical chest pain but has done well recently with no symptoms.  Plan to continue medical therapy with aspirin and statin.  Note her electrocardiogram shows no ST changes.  2 hypertension-blood pressure controlled.  Continue present medications.  Check potassium and renal function  3 hyperlipidemia-continue statin.  Check lipids and liver  4 carotid artery disease-mild  on most recent Dopplers.  5 tobacco abuse-patient has been counseled on discontinuing.  Kirk Ruths, MD

## 2019-10-23 ENCOUNTER — Encounter: Payer: Self-pay | Admitting: Cardiology

## 2019-10-23 ENCOUNTER — Ambulatory Visit (INDEPENDENT_AMBULATORY_CARE_PROVIDER_SITE_OTHER): Payer: Medicare HMO | Admitting: Cardiology

## 2019-10-23 ENCOUNTER — Other Ambulatory Visit: Payer: Self-pay

## 2019-10-23 VITALS — BP 107/73 | HR 86 | Ht 63.0 in | Wt 201.4 lb

## 2019-10-23 DIAGNOSIS — E7800 Pure hypercholesterolemia, unspecified: Secondary | ICD-10-CM

## 2019-10-23 DIAGNOSIS — I2510 Atherosclerotic heart disease of native coronary artery without angina pectoris: Secondary | ICD-10-CM

## 2019-10-23 DIAGNOSIS — I10 Essential (primary) hypertension: Secondary | ICD-10-CM | POA: Diagnosis not present

## 2019-10-23 NOTE — Patient Instructions (Addendum)
Medication Instructions:  NO CHANGE *If you need a refill on your cardiac medications before your next appointment, please call your pharmacy*   Lab Work: Your physician recommends that you return for lab work PRIOR TO EATING  If you have labs (blood work) drawn today and your tests are completely normal, you will receive your results only by: Marland Kitchen MyChart Message (if you have MyChart) OR . A paper copy in the mail If you have any lab test that is abnormal or we need to change your treatment, we will call you to review the results.   Follow-Up: At [facility], you and your health needs are our priority.  As part of our continuing mission to provide you with exceptional heart care, we have created designated Provider Care Teams.  These Care Teams include your primary Cardiologist (physician) and Advanced Practice Providers (APPs -  Physician Assistants and Nurse Practitioners) who all work together to provide you with the care you need, when you need it.  We recommend signing up for the patient portal called "MyChart".  Sign up information is provided on this After Visit Summary.  MyChart is used to connect with patients for Virtual Visits (Telemedicine).  Patients are able to view lab/test results, encounter notes, upcoming appointments, etc.  Non-urgent messages can be sent to your provider as well.   To learn more about what you can do with MyChart, go to [email or web address].    Your next appointment:   6 month(s)  The format for your next appointment:   Either In Person or Virtual  Provider:   Kirk Ruths, MD

## 2019-10-24 LAB — COMPREHENSIVE METABOLIC PANEL
AG Ratio: 1.8 (calc) (ref 1.0–2.5)
ALT: 11 U/L (ref 6–29)
AST: 16 U/L (ref 10–35)
Albumin: 4.1 g/dL (ref 3.6–5.1)
Alkaline phosphatase (APISO): 60 U/L (ref 37–153)
BUN: 16 mg/dL (ref 7–25)
CO2: 29 mmol/L (ref 20–32)
Calcium: 9.2 mg/dL (ref 8.6–10.4)
Chloride: 102 mmol/L (ref 98–110)
Creat: 0.8 mg/dL (ref 0.60–0.93)
Globulin: 2.3 g/dL (calc) (ref 1.9–3.7)
Glucose, Bld: 100 mg/dL — ABNORMAL HIGH (ref 65–99)
Potassium: 4.5 mmol/L (ref 3.5–5.3)
Sodium: 139 mmol/L (ref 135–146)
Total Bilirubin: 0.4 mg/dL (ref 0.2–1.2)
Total Protein: 6.4 g/dL (ref 6.1–8.1)

## 2019-10-24 LAB — LIPID PANEL
Cholesterol: 370 mg/dL — ABNORMAL HIGH (ref <200)
HDL: 64 mg/dL (ref >=50)
LDL Cholesterol (Calc): 249 mg/dL (calc) — ABNORMAL HIGH
Non-HDL Cholesterol (Calc): 306 mg/dL (calc) — ABNORMAL HIGH (ref <130)
Total CHOL/HDL Ratio: 5.8 (calc) — ABNORMAL HIGH (ref <5.0)
Triglycerides: 339 mg/dL — ABNORMAL HIGH (ref <150)

## 2019-10-29 ENCOUNTER — Telehealth: Payer: Self-pay | Admitting: Cardiology

## 2019-10-29 ENCOUNTER — Encounter: Payer: Self-pay | Admitting: *Deleted

## 2019-10-29 ENCOUNTER — Telehealth: Payer: Self-pay | Admitting: *Deleted

## 2019-10-29 DIAGNOSIS — E7800 Pure hypercholesterolemia, unspecified: Secondary | ICD-10-CM

## 2019-10-29 MED ORDER — ISOSORBIDE MONONITRATE ER 30 MG PO TB24: 30.0000 mg | ORAL_TABLET | Freq: Every day | ORAL | 3 refills | Status: DC

## 2019-10-29 MED ORDER — ROSUVASTATIN CALCIUM 40 MG PO TABS: ORAL_TABLET | ORAL | 3 refills | Status: DC

## 2019-10-29 NOTE — Telephone Encounter (Signed)
Spoke with pt, she is currently lying down and is not sure about her medications. She will call me once she gets up this afternoon.

## 2019-10-29 NOTE — Telephone Encounter (Signed)
Spoke with pt, she does not have the rosuvastatin in her medication bag. New script sent to the pharmacy and Lab orders mailed to the pt

## 2019-10-29 NOTE — Telephone Encounter (Signed)
-----   Message from Lelon Perla, MD sent at 10/24/2019  7:21 AM EDT ----- Is she taking crestor? If not resume 40 mg daily with lipids and liver 12 weeks. If so refer [facility] lipid clinic [facility]

## 2019-10-29 NOTE — Telephone Encounter (Signed)
Accessed chart to see who called her earlier. Reached out to [place] via secure chat and transferred call.

## 2019-10-29 NOTE — Telephone Encounter (Signed)
This encounter was created in error - please disregard.

## 2019-11-07 ENCOUNTER — Other Ambulatory Visit: Payer: Self-pay

## 2019-11-07 MED ORDER — LEVOTHYROXINE SODIUM 150 MCG PO TABS: 150.0000 ug | ORAL_TABLET | Freq: Every day | ORAL | 0 refills | Status: DC

## 2019-11-11 ENCOUNTER — Other Ambulatory Visit: Payer: Self-pay

## 2019-11-11 MED ORDER — QUETIAPINE FUMARATE 200 MG PO TABS: 200.0000 mg | ORAL_TABLET | Freq: Every day | ORAL | 0 refills | Status: DC

## 2019-11-25 ENCOUNTER — Other Ambulatory Visit: Payer: Self-pay

## 2019-11-25 MED ORDER — MELOXICAM 15 MG PO TABS: 15.0000 mg | ORAL_TABLET | Freq: Every day | ORAL | 0 refills | Status: DC

## 2019-11-27 ENCOUNTER — Ambulatory Visit (INDEPENDENT_AMBULATORY_CARE_PROVIDER_SITE_OTHER): Payer: Medicare HMO

## 2019-11-27 ENCOUNTER — Other Ambulatory Visit: Payer: Self-pay

## 2019-11-27 ENCOUNTER — Ambulatory Visit (INDEPENDENT_AMBULATORY_CARE_PROVIDER_SITE_OTHER): Payer: Medicare HMO | Admitting: Osteopathic Medicine

## 2019-11-27 ENCOUNTER — Encounter: Payer: Self-pay | Admitting: Osteopathic Medicine

## 2019-11-27 ENCOUNTER — Telehealth: Payer: Self-pay

## 2019-11-27 VITALS — BP 102/66 | HR 81 | Temp 98.0°F | Wt 203.0 lb

## 2019-11-27 DIAGNOSIS — M50122 Cervical disc disorder at C5-C6 level with radiculopathy: Secondary | ICD-10-CM | POA: Diagnosis not present

## 2019-11-27 DIAGNOSIS — R937 Abnormal findings on diagnostic imaging of other parts of musculoskeletal system: Secondary | ICD-10-CM | POA: Diagnosis not present

## 2019-11-27 DIAGNOSIS — M542 Cervicalgia: Secondary | ICD-10-CM

## 2019-11-27 DIAGNOSIS — M4802 Spinal stenosis, cervical region: Secondary | ICD-10-CM | POA: Diagnosis not present

## 2019-11-27 DIAGNOSIS — M19012 Primary osteoarthritis, left shoulder: Secondary | ICD-10-CM | POA: Diagnosis not present

## 2019-11-27 DIAGNOSIS — M25512 Pain in left shoulder: Secondary | ICD-10-CM

## 2019-11-27 DIAGNOSIS — M4722 Other spondylosis with radiculopathy, cervical region: Secondary | ICD-10-CM | POA: Diagnosis not present

## 2019-11-27 DIAGNOSIS — M50123 Cervical disc disorder at C6-C7 level with radiculopathy: Secondary | ICD-10-CM | POA: Diagnosis not present

## 2019-11-27 DIAGNOSIS — E039 Hypothyroidism, unspecified: Secondary | ICD-10-CM | POA: Diagnosis not present

## 2019-11-27 DIAGNOSIS — M5412 Radiculopathy, cervical region: Secondary | ICD-10-CM

## 2019-11-27 MED ORDER — IBUPROFEN 800 MG PO TABS: 800.0000 mg | ORAL_TABLET | Freq: Three times a day (TID) | ORAL | 0 refills | Status: DC | PRN

## 2019-11-27 MED ORDER — PREDNISONE 10 MG (48) PO TBPK
ORAL_TABLET | Freq: Every day | ORAL | 0 refills | Status: DC
Start: 2019-11-27 — End: 2020-01-27

## 2019-11-27 NOTE — Telephone Encounter (Signed)
Patient has appt today and wants to have labs done prior, in case appt is not done before 5:00 (she is a 40 minute patient scheduled at 4:00).   Thyroid labs placed

## 2019-11-27 NOTE — Progress Notes (Signed)
Autumn Bowen is a 74 y.o. female who presents to  [facility]  today, 11/27/19, seeking care for the following:  . L shoulder pain - feels like pinched nerve in shoulder, neck pain radiating down arm. Ongoing for 1 week. Intermitten, seems random. Taking ibuprofen, gabapentin. Feels better when she hold her arm up at the elbow.   . On exam, pain limits ROM, (+)Spurlings to the L  . Ortho had previously discussed shoulder replacement, has had injections which were not helpful      ASSESSMENT & PLAN with other pertinent findings:  The primary encounter diagnosis was Neck pain. A diagnosis of Acute pain of left shoulder was also pertinent to this visit. Frozen shoulder, cervical radiculopathy likely.    There are no Patient Instructions on file for this visit.  Orders Placed This Encounter  Procedures  . DG Cervical Spine Complete  . DG Shoulder Left  . Ambulatory referral to Orthopedic Surgery    Meds ordered this encounter  Medications  . predniSONE (STERAPRED UNI-PAK 48 TAB) 10 MG (48) TBPK tablet    Sig: Take by mouth daily. 12-Day taper, po    Dispense:  48 tablet    Refill:  0  . ibuprofen (ADVIL) 800 MG tablet    Sig: Take 1 tablet (800 mg total) by mouth every 8 (eight) hours as needed (pain).    Dispense:  30 tablet    Refill:  0       Follow-up instructions: Return in about 1 week (around 12/04/2019) for someitme next week recheck shoulder and other concerns (OV40).                                         BP 102/66 (BP Location: Right Arm, Patient Position: Sitting)   Pulse 81   Temp 98 F (36.7 C)   Wt 203 lb (92.1 kg)   LMP 09/21/1974   SpO2 93%   BMI 35.96 kg/m   Current Meds  Medication Sig  . acetaminophen (TYLENOL) 500 MG tablet Take 1,000 mg by mouth every 6 (six) hours as needed for mild pain or moderate pain.  Marland Kitchen albuterol (PROVENTIL HFA;VENTOLIN HFA)  108 (90 Base) MCG/ACT inhaler Inhale 2 puffs into the lungs every 4 (four) hours as needed for wheezing.  Marland Kitchen aspirin EC 81 MG tablet Take 1 tablet (81 mg total) by mouth daily.  . cholecalciferol (VITAMIN D) 1000 units tablet Take 1,000 Units by mouth daily.  . colchicine 0.6 MG tablet Take 1 tablet (0.6 mg total) by mouth daily.  . diclofenac sodium (VOLTAREN) 1 % GEL Apply 4 g topically 4 (four) times daily. To affected joint.  Marland Kitchen dicyclomine (BENTYL) 10 MG capsule Take 1 capsule (10 mg total) by mouth 2 (two) times daily.  . empagliflozin (JARDIANCE) 10 MG TABS tablet Take 10 mg by mouth daily.  Marland Kitchen EPINEPHrine (EPIPEN 2-PAK) 0.3 mg/0.3 mL IJ SOAJ injection Inject 0.3 mLs (0.3 mg total) into the muscle as needed for anaphylaxis.  . famotidine (PEPCID) 20 MG tablet TAKE 1 TABLET(20 MG TOTAL) BY MOUTH DAILY.  Marland Kitchen Fluticasone-Salmeterol (ADVAIR DISKUS) 250-50 MCG/DOSE AEPB Inhale 1 puff into the lungs 2 (two) times daily.  Marland Kitchen gabapentin (NEURONTIN) 300 MG capsule Take 636m TID.  May increase to 907mTID if needed.  . Marland KitchenLUCOSAMINE-CHONDROITIN PO Take 1 tablet by mouth 2 (  two) times daily.   . Insulin Degludec (TRESIBA FLEXTOUCH) 200 UNIT/ML SOPN Inject 28-60 Units into the skin at bedtime.  . isosorbide mononitrate (IMDUR) 30 MG 24 hr tablet Take 1 tablet (30 mg total) by mouth daily. Need office visit w/ cardiology for more refills  . levothyroxine (SYNTHROID) 150 MCG tablet Take 1 tablet (150 mcg total) by mouth daily before breakfast. **PATIENT NEEDS OFFICE VISIT AND LAB WORK FOR ADDITIONAL REFILLS**  . LUMIGAN 0.01 % SOLN Instill 1 drop into both eyes every night  . meclizine (ANTIVERT) 25 MG tablet Take 1 tablet (25 mg total) by mouth 3 (three) times daily.  . meloxicam (MOBIC) 15 MG tablet Take 1 tablet (15 mg total) by mouth daily. Take with food  . metFORMIN (GLUCOPHAGE-XR) 750 MG 24 hr tablet Take one tablet by mouth daily with breakfast.  . Multiple Vitamins-Minerals (PRESERVISION AREDS 2 PO)  Take 1 capsule by mouth 2 (two) times daily.  . QUEtiapine (SEROQUEL) 200 MG tablet Take 1 tablet (200 mg total) by mouth at bedtime.  . rosuvastatin (CRESTOR) 40 MG tablet TAKE 1 TABLET(40 MG) BY MOUTH DAILY  . sertraline (ZOLOFT) 100 MG tablet Take 2 tablets (200 mg total) by mouth daily.    No results found for this or any previous visit (from the past 72 hour(s)).  No results found.     All questions at time of visit were answered - patient instructed to contact office with any additional concerns or updates.  ER/RTC precautions were reviewed with the patient as applicable.   Please note: voice recognition software was used to produce this document, and typos may escape review. Please contact Dr. Sheppard Coil for any needed clarifications.

## 2019-11-28 ENCOUNTER — Other Ambulatory Visit: Payer: Self-pay | Admitting: Osteopathic Medicine

## 2019-11-28 LAB — T4, FREE: Free T4: 0.9 ng/dL (ref 0.8–1.8)

## 2019-11-28 LAB — T3, FREE: T3, Free: 2.1 pg/mL — ABNORMAL LOW (ref 2.3–4.2)

## 2019-11-28 LAB — TSH+FREE T4: TSH W/REFLEX TO FT4: 8.97 mIU/L — ABNORMAL HIGH (ref 0.40–4.50)

## 2019-11-28 MED ORDER — LEVOTHYROXINE SODIUM 175 MCG PO TABS: 175.0000 ug | ORAL_TABLET | Freq: Every day | ORAL | 0 refills | Status: DC

## 2019-11-29 NOTE — Addendum Note (Signed)
Addended by: Maryla Morrow on: 11/29/2019 09:56 AM   Modules accepted: Orders

## 2019-12-04 ENCOUNTER — Ambulatory Visit (INDEPENDENT_AMBULATORY_CARE_PROVIDER_SITE_OTHER): Payer: Medicare HMO | Admitting: Osteopathic Medicine

## 2019-12-04 VITALS — BP 100/63 | HR 80 | Wt 200.0 lb

## 2019-12-04 DIAGNOSIS — M19012 Primary osteoarthritis, left shoulder: Secondary | ICD-10-CM | POA: Diagnosis not present

## 2019-12-04 DIAGNOSIS — N898 Other specified noninflammatory disorders of vagina: Secondary | ICD-10-CM | POA: Diagnosis not present

## 2019-12-04 MED ORDER — FLUCONAZOLE 150 MG PO TABS
ORAL_TABLET | ORAL | 0 refills | Status: DC
Start: 2019-12-04 — End: 2020-01-27

## 2019-12-04 MED ORDER — TRAMADOL HCL 50 MG PO TABS: 50.0000 mg | ORAL_TABLET | Freq: Three times a day (TID) | ORAL | 0 refills | Status: DC | PRN

## 2019-12-04 NOTE — Progress Notes (Signed)
HPI: Autumn Bowen is a 74 y.o. female who  has a past medical history of Allergy, Anemia, Celiac disease, Celiac disease, COPD (chronic obstructive pulmonary disease) ([place]), Diabetes mellitus type II, Diverticulitis, Dyslipidemia, HOH (hard of hearing), Hypertension, Hypothyroidism, Sleep apnea, and Sore throat.  she presents to [facility] today, 12/04/19,  for chief complaint of: Left shoulder pain, vaginal discharge, and abnormal discharge from moles under breasts.  Patient reports she has been seen multiple times now for left shoulder and neck pain. She states the pain has worsened and is now preventing her from sleep. She states ibuprofen is no longer sufficient for this pain. She states she is waiting for insurance to approve her getting an MRI of the shoulder and her C spine.  She also reports 2 years of vaginal discharge. She states the discharge is thick, sticky, and clear with white spots. She also endorses vaginal itching. She states she has urinary incontinence, but otherwise denies dysuria or urinary frequency. She states she has been treated for this twice in the last 2 years, but never noted any improvement, even temporary improvement.   She also notes that there have been moles under her breast which have been leaking and leaving stains on her bra. She states this has been going on for the last year or so.   Past medical, surgical, social and family history reviewed:  Patient Active Problem List   Diagnosis Date Noted  . Primary osteoarthritis of left shoulder 12/04/2019  . Dysuria 08/29/2019  . Spinal stenosis of lumbosacral region 03/19/2019  . HTN (hypertension) 02/08/2019  . Injury of left knee 02/21/2018  . Microalbuminuria due to type 2 diabetes mellitus ([place]) 02/21/2018  . Hyperlipidemia associated with type 2 diabetes mellitus ([place]) 02/21/2018  . CAD (coronary artery disease) 01/01/2018  . Chronic pain syndrome 12/13/2017  .  Angina pectoris ([place]) 12/07/2017  . Diabetes mellitus due to underlying condition with unspecified complications ([place]) 34/74/2595  . Radiographic dye allergy status 12/05/2017  . Pseudogout of left knee 11/16/2017  . Diverticulitis of colon 04/12/2016  . Broken or cracked tooth, nontraumatic 03/27/2016  . Memory deficit 05/05/2015  . Hearing difficulty of both ears 05/05/2015  . OSA (obstructive sleep apnea) 04/28/2015  . Seborrheic keratoses 01/31/2015  . Left-sided low back pain with left-sided sciatica 01/31/2015  . GERD (gastroesophageal reflux disease) 01/26/2015  . Celiac disease 01/26/2015  . Cervical spondylosis 12/04/2014  . Osteoarthritis of first metatarsophalangeal joint 11/17/2014  . Seasonal allergies 10/04/2014  . Neuropathy 10/04/2014  . Toenail fungus 10/03/2014  . Adenomatous polyp of colon 07/30/2014  . Macular degeneration, dry 07/29/2014  . Type 2 diabetes mellitus with complication ([place]) 63/87/5643  . Migraine without aura and without status migrainosus, not intractable 07/28/2014  . COPD (chronic obstructive pulmonary disease) with chronic bronchitis ([place]) 05/10/2009  . VISION DISORDER 12/03/2008  . OSTEOPENIA 11/18/2008  . VITAMIN D DEFICIENCY 11/06/2008  . Major depressive disorder 11/05/2008  . FATIGUE 11/05/2008  . ANEMIA 10/23/2008  . DYSLIPIDEMIA 10/16/2008  . TOBACCO ABUSE 10/15/2008  . ALLERGIC RHINITIS 10/15/2008  . POST TRAUMATIC STRESS SYNDROME 05/30/2000  . Hypothyroidism 05/30/1988  . POSTMENOPAUSAL STATUS 05/30/1968    Past Surgical History:  Procedure Laterality Date  . APPENDECTOMY    . BREAST SURGERY     h/o benign cystleft breast and lymp node removal on right breast area.  Marland Kitchen EYE SURGERY     Left eye cataract removal, right eye h/o macular degeneration.  Marland Kitchen  INTRAVASCULAR PRESSURE WIRE/FFR STUDY N/A 12/07/2017   Procedure: INTRAVASCULAR PRESSURE WIRE/FFR STUDY;  Surgeon: Leonie Man, MD;  Location: [facility];  Service:  Cardiovascular;  Laterality: N/A;  . LEFT HEART CATH AND CORONARY ANGIOGRAPHY N/A 12/07/2017   Procedure: LEFT HEART CATH AND CORONARY ANGIOGRAPHY;  Surgeon: Leonie Man, MD;  Location: [facility];  Service: Cardiovascular;  Laterality: N/A;  . SPINE SURGERY     Pt not sure of the type of surgery she had but knows it was L4-5.  Marland Kitchen VAGINAL HYSTERECTOMY      Social History   Tobacco Use  . Smoking status: Current Some Day Smoker    Packs/day: 0.25    Years: 51.00    Pack years: 12.75    Types: Cigarettes    Start date: 01/02/2012  . Smokeless tobacco: Never Used  . Tobacco comment: Been down to 4 - 6 cigs a day  Substance Use Topics  . Alcohol use: No    Family History  Problem Relation Age of Onset  . Anxiety disorder Mother   . Depression Mother   . Heart attack Mother   . Stroke Mother   . Diabetes type II Mother   . Diabetes Father   . Glaucoma Father   . Hypertension Father   . COPD Father   . COPD Sister   . Hashimoto's thyroiditis Sister   . Cancer Sister   . Kidney disease Sister   . Drug abuse Brother   . Alcohol abuse Brother   . Asthma Son   . Diabetes Son      Current medication list and allergy/intolerance information reviewed:    Current Outpatient Medications  Medication Sig Dispense Refill  . acetaminophen (TYLENOL) 500 MG tablet Take 1,000 mg by mouth every 6 (six) hours as needed for mild pain or moderate pain.    Marland Kitchen albuterol (PROVENTIL HFA;VENTOLIN HFA) 108 (90 Base) MCG/ACT inhaler Inhale 2 puffs into the lungs every 4 (four) hours as needed for wheezing. 2 Inhaler 11  . aspirin EC 81 MG tablet Take 1 tablet (81 mg total) by mouth daily. 90 tablet 3  . cholecalciferol (VITAMIN D) 1000 units tablet Take 1,000 Units by mouth daily.    . colchicine 0.6 MG tablet Take 1 tablet (0.6 mg total) by mouth daily. 90 tablet 3  . diclofenac sodium (VOLTAREN) 1 % GEL Apply 4 g topically 4 (four) times daily. To affected joint. 100 g 11  .  dicyclomine (BENTYL) 10 MG capsule Take 1 capsule (10 mg total) by mouth 2 (two) times daily. 180 capsule 3  . empagliflozin (JARDIANCE) 10 MG TABS tablet Take 10 mg by mouth daily. 90 tablet 1  . EPINEPHrine (EPIPEN 2-PAK) 0.3 mg/0.3 mL IJ SOAJ injection Inject 0.3 mLs (0.3 mg total) into the muscle as needed for anaphylaxis. 1 Device prn  . famotidine (PEPCID) 20 MG tablet TAKE 1 TABLET(20 MG TOTAL) BY MOUTH DAILY. 90 tablet 1  . Fluticasone-Salmeterol (ADVAIR DISKUS) 250-50 MCG/DOSE AEPB Inhale 1 puff into the lungs 2 (two) times daily. 60 each 12  . gabapentin (NEURONTIN) 300 MG capsule Take 648m TID.  May increase to 9074mTID if needed. 180 capsule 3  . GLUCOSAMINE-CHONDROITIN PO Take 1 tablet by mouth 2 (two) times daily.     . Marland Kitchenbuprofen (ADVIL) 800 MG tablet Take 1 tablet (800 mg total) by mouth every 8 (eight) hours as needed (pain). 30 tablet 0  . Insulin Degludec (TRESIBA FLEXTOUCH) 200  UNIT/ML SOPN Inject 28-60 Units into the skin at bedtime. 15 mL 99  . isosorbide mononitrate (IMDUR) 30 MG 24 hr tablet Take 1 tablet (30 mg total) by mouth daily. Need office visit w/ cardiology for more refills 90 tablet 3  . levothyroxine (SYNTHROID) 175 MCG tablet Take 1 tablet (175 mcg total) by mouth daily before breakfast. LAB RECHECK AROUND 01/27/20 90 tablet 0  . LUMIGAN 0.01 % SOLN Instill 1 drop into both eyes every night  3  . meclizine (ANTIVERT) 25 MG tablet Take 1 tablet (25 mg total) by mouth 3 (three) times daily. 90 tablet 3  . meloxicam (MOBIC) 15 MG tablet Take 1 tablet (15 mg total) by mouth daily. Take with food 90 tablet 0  . metFORMIN (GLUCOPHAGE-XR) 750 MG 24 hr tablet Take one tablet by mouth daily with breakfast. 90 tablet 1  . Multiple Vitamins-Minerals (PRESERVISION AREDS 2 PO) Take 1 capsule by mouth 2 (two) times daily.    . predniSONE (STERAPRED UNI-PAK 48 TAB) 10 MG (48) TBPK tablet Take by mouth daily. 12-Day taper, po 48 tablet 0  . QUEtiapine (SEROQUEL) 200 MG tablet  Take 1 tablet (200 mg total) by mouth at bedtime. 90 tablet 0  . rosuvastatin (CRESTOR) 40 MG tablet TAKE 1 TABLET(40 MG) BY MOUTH DAILY 90 tablet 3  . sertraline (ZOLOFT) 100 MG tablet Take 2 tablets (200 mg total) by mouth daily. 120 tablet 3  . nitroGLYCERIN (NITROSTAT) 0.4 MG SL tablet Place 1 tablet (0.4 mg total) under the tongue every 5 (five) minutes as needed for chest pain. 25 tablet 11   No current facility-administered medications for this visit.    Allergies  Allergen Reactions  . Bee Venom Anaphylaxis  . Iodinated Diagnostic Agents Other (See Comments)    Per patient cardiac arrest IVP DYE-CARDIAC ARREST  . Penicillins Anaphylaxis    Has patient had a PCN reaction causing immediate rash, facial/tongue/throat swelling, SOB or lightheadedness with hypotension: yes Has patient had a PCN reaction causing severe rash involving mucus membranes or skin necrosis: no Has patient had a PCN reaction that required hospitalization: yes Has patient had a PCN reaction occurring within the last 10 years: no If all of the above answers are "NO", then may proceed with Cephalosporin use.   . Povidone Iodine Itching and Rash  . Tomato Itching  . Chocolate Rash  . Latex Rash  . Other Rash  . Quetiapine Fumarate Other (See Comments)    Per pt she can not take XR   . Trazodone And Nefazodone Hives  . Zolpidem Tartrate Nausea And Vomiting  . Gluten Meal   . Zolpidem Tartrate Nausea And Vomiting  . Atorvastatin Calcium Nausea Only  . Esomeprazole Magnesium Nausea Only  . Metformin And Related Diarrhea      Review of Systems:  Constitutional:  No  fever, no chills, No recent illness   HEENT: No  headache,   Cardiac: No  chest pain  Respiratory:  No  shortness of breath. No  Cough  Gastrointestinal: No  abdominal pain, No  nausea, No  vomiting  Musculoskeletal: Endorses left shoulder pain see Dr Darene Lamer note  Skin: "Leaking moles under breasts"  Genitourinary: Endorses vaginal  discharge, urinary incontinence stable  Neurologic: No  weakness, No  dizziness, No  slurred speech/focal weakness/facial droop  Psychiatric: No  concerns with depression, No  concerns with anxiety, No mood problems  Exam:  BP 100/63 (BP Location: Right Arm, Patient Position: Sitting)   Pulse  80   Wt 200 lb (90.7 kg)   LMP 09/21/1974   SpO2 92%   BMI 35.43 kg/m   Constitutional: VS see above. General Appearance: alert, well-developed, well-nourished, NAD  Eyes: Normal lids and conjunctive, non-icteric sclera  Neck: No masses, trachea midline. No thyroid enlargement. No tenderness/mass appreciated. No lymphadenopathy  Respiratory: Normal respiratory effort. no wheeze, no rhonchi, no rales  Cardiovascular: S1/S2 normal, no murmur, no rub/gallop auscultated. RRR.  Gastrointestinal: Nontender, no masses. No hepatomegaly, no splenomegaly.  Genitourinary: Erythema to vulva. No vaginal discharge noted. Discomfort with vaginal swab. Fecal matter noted around anus/perineal area.  Musculoskeletal: Tenderness and limited ROM to left shoulder. Gait normal. No clubbing/cyanosis of digits.   Neurological: Normal balance/coordination. No tremor. No cranial nerve deficit on limited exam.  Skin: Numerous seborrheic keratoses under bilateral breasts. No drainage, weeping, irritation noted. warm, dry, intact. No rash/ulcer.    Psychiatric: Normal judgment/insight. Normal mood and affect. Oriented x3.    No results found for this or any previous visit (from the past 72 hour(s)).  No results found.   ASSESSMENT/PLAN: The primary encounter diagnosis was Vaginal discharge. A diagnosis of Primary osteoarthritis of left shoulder was also pertinent to this visit.   Irritation on pelvic exam with patient's symptoms concerning for yeast infection. Vaginal swab sent to lab. Prescribed weekly diflucan for presumed yeast infection see pt instructions. Will reassess in 4 weeks.  Dr. Dianah Field  consulted for shoulder pain and did joint injection. Refer to his note for further information.  Seborrheic keratoses noted under patient's bilateral breasts, but not drainage noted. Advised patient keep the area dry with baby powder/cornstarch.   Orders Placed This Encounter  Procedures  . SureSwab, Vaginosis/Vaginitis Plus    No orders of the defined types were placed in this encounter.   Patient Instructions  Vaginal area is red - looks like yeast infection. There was also some soiling - please be sure to wipe really well to avoid skin irritation. This problem will keep happening if the skin is not dry and clean.   Will try treating for yeast today, in 3 days, and after that weekly for a month.           Visit summary with medication list and pertinent instructions was printed for patient to review. All questions at time of visit were answered - patient instructed to contact office with any additional concerns or updates. ER/RTC precautions were reviewed with the patient.   Please note: voice recognition software was used to produce this document, and typos may escape review. Please contact Dr. Sheppard Coil for any needed clarifications.     Follow-up plan: Return in about 4 weeks (around 01/01/2020), or if symptoms worsen or fail to improve, for DUE FOR DIABETES CHECK-UP. SEE [place] SOONER IF SYPMTOMS WORSEN / FAIL TO IMPROVE.  Time spent 40 minutes

## 2019-12-04 NOTE — Patient Instructions (Signed)
Vaginal area is red - looks like yeast infection. There was also some soiling - please be sure to wipe really well to avoid skin irritation. This problem will keep happening if the skin is not dry and clean.   Will try treating for yeast today, in 3 days, and after that weekly for a month.

## 2019-12-04 NOTE — Progress Notes (Signed)
    Procedures performed today:    Procedure: Real-time Ultrasound Guided injection of the left glenohumeral joint Device: Samsung HS60  Verbal informed consent obtained.  Time-out conducted.  Noted no overlying erythema, induration, or other signs of local infection.  Skin prepped in a sterile fashion.  Local anesthesia: Topical Ethyl chloride.  With sterile technique and under real time ultrasound guidance: 1 cc Kenalog 40, 2 cc lidocaine, 2 cc bupivacaine injected easily Completed without difficulty  Pain immediately resolved suggesting accurate placement of the medication.  Advised to call if fevers/chills, erythema, induration, drainage, or persistent bleeding.  Images permanently stored and available for review in the ultrasound unit.  Impression: Technically successful ultrasound guided injection.  Independent interpretation of notes and tests performed by another provider:   Left shoulder x-rays personally reviewed, she has end-stage glenohumeral osteoarthritis.  Brief History, Exam, Impression, and Recommendations:    Primary osteoarthritis of left shoulder Osteoarthritis of her left shoulder, severe pain, nothing oral is working, today we injected her shoulder, return to see me in a month. I would also like a surgical opinion from Dr. Griffin Basil, adding some tramadol in the meantime.    ___________________________________________ Gwen Her. Dianah Field, M.D., ABFM., CAQSM. Primary Care and [place] Instructor of [place] [employer] [facility] [employer]

## 2019-12-04 NOTE — Assessment & Plan Note (Addendum)
Osteoarthritis of her left shoulder, severe pain, nothing oral is working, today we injected her shoulder, return to see me in a month. I would also like a surgical opinion from Dr. Griffin Basil, adding some tramadol in the meantime.

## 2019-12-07 ENCOUNTER — Other Ambulatory Visit: Payer: Self-pay | Admitting: Family Medicine

## 2019-12-08 LAB — SURESWAB, VAGINOSIS/VAGINITIS PLUS
Atopobium vaginae: NOT DETECTED Log (cells/mL)
C. albicans, DNA: DETECTED — AB
C. glabrata, DNA: NOT DETECTED
C. parapsilosis, DNA: NOT DETECTED
C. trachomatis RNA, TMA: NOT DETECTED
C. tropicalis, DNA: NOT DETECTED
Gardnerella vaginalis: NOT DETECTED Log (cells/mL)
LACTOBACILLUS SPECIES: DETECTED Log (cells/mL)
MEGASPHAERA SPECIES: NOT DETECTED Log (cells/mL)
N. gonorrhoeae RNA, TMA: NOT DETECTED
Trichomonas vaginalis RNA: NOT DETECTED

## 2019-12-09 NOTE — Progress Notes (Signed)
Lab results given to patient. No questions concerns at this time.

## 2019-12-12 ENCOUNTER — Telehealth: Payer: Self-pay | Admitting: Osteopathic Medicine

## 2019-12-12 DIAGNOSIS — I10 Essential (primary) hypertension: Secondary | ICD-10-CM | POA: Diagnosis not present

## 2019-12-12 DIAGNOSIS — F17210 Nicotine dependence, cigarettes, uncomplicated: Secondary | ICD-10-CM | POA: Diagnosis not present

## 2019-12-12 DIAGNOSIS — M19012 Primary osteoarthritis, left shoulder: Secondary | ICD-10-CM

## 2019-12-12 DIAGNOSIS — K5730 Diverticulosis of large intestine without perforation or abscess without bleeding: Secondary | ICD-10-CM | POA: Diagnosis not present

## 2019-12-12 DIAGNOSIS — J439 Emphysema, unspecified: Secondary | ICD-10-CM | POA: Diagnosis not present

## 2019-12-12 DIAGNOSIS — Z8673 Personal history of transient ischemic attack (TIA), and cerebral infarction without residual deficits: Secondary | ICD-10-CM | POA: Diagnosis not present

## 2019-12-12 DIAGNOSIS — E119 Type 2 diabetes mellitus without complications: Secondary | ICD-10-CM | POA: Diagnosis not present

## 2019-12-12 DIAGNOSIS — N390 Urinary tract infection, site not specified: Secondary | ICD-10-CM | POA: Diagnosis not present

## 2019-12-12 DIAGNOSIS — R319 Hematuria, unspecified: Secondary | ICD-10-CM | POA: Diagnosis not present

## 2019-12-12 DIAGNOSIS — F329 Major depressive disorder, single episode, unspecified: Secondary | ICD-10-CM | POA: Diagnosis not present

## 2019-12-12 DIAGNOSIS — N281 Cyst of kidney, acquired: Secondary | ICD-10-CM | POA: Diagnosis not present

## 2019-12-12 DIAGNOSIS — E079 Disorder of thyroid, unspecified: Secondary | ICD-10-CM | POA: Diagnosis not present

## 2019-12-12 NOTE — Telephone Encounter (Signed)
Patient is passing blood in her urine. Has been going on for 5 days and having pelvic pain on her right side. States that its not getting better. Per Tanna Furry patient was told to seek urgent care and not wait till tomorrow. Patient voiced understanding. Also was having shoulder pain.

## 2019-12-16 ENCOUNTER — Telehealth: Payer: Self-pay | Admitting: Osteopathic Medicine

## 2019-12-16 NOTE — Telephone Encounter (Signed)
I have not seen this patient since 2019, she needs to talk to her PCP.  I am not sure what we are treating, or if adding tramadol is even the best plan here.

## 2019-12-16 NOTE — Telephone Encounter (Signed)
Pt called saying the tramadol helped her sleep but she's still in a lot of pain. I told her the best thing is to make an appt but she said she just wanted to communicate with you. She said it's unbearable. I told her the last time she was seen by ortho was with Dr. Georgina Snell. Still insisted I send a message to see what your thoughts are. She said she would really like to not make an appt, but if you say it's necessary then she will.

## 2019-12-18 MED ORDER — TRAMADOL HCL 50 MG PO TABS: 50.0000 mg | ORAL_TABLET | Freq: Three times a day (TID) | ORAL | 0 refills | Status: DC | PRN

## 2019-12-18 NOTE — Addendum Note (Signed)
Addended by: Silverio Decamp on: 12/18/2019 08:40 AM   Modules accepted: Orders

## 2019-12-18 NOTE — Telephone Encounter (Signed)
Discussed with Dr. Darene Lamer, he saw her few weeks ago, will address

## 2019-12-19 ENCOUNTER — Ambulatory Visit: Payer: Medicare HMO | Admitting: Sports Medicine

## 2019-12-20 ENCOUNTER — Telehealth: Payer: Self-pay

## 2019-12-20 ENCOUNTER — Encounter: Payer: Self-pay | Admitting: Osteopathic Medicine

## 2019-12-20 ENCOUNTER — Telehealth (INDEPENDENT_AMBULATORY_CARE_PROVIDER_SITE_OTHER): Payer: Medicare HMO | Admitting: Osteopathic Medicine

## 2019-12-20 DIAGNOSIS — Z043 Encounter for examination and observation following other accident: Secondary | ICD-10-CM | POA: Diagnosis not present

## 2019-12-20 DIAGNOSIS — R7989 Other specified abnormal findings of blood chemistry: Secondary | ICD-10-CM | POA: Diagnosis not present

## 2019-12-20 DIAGNOSIS — M25512 Pain in left shoulder: Secondary | ICD-10-CM | POA: Diagnosis not present

## 2019-12-20 DIAGNOSIS — J439 Emphysema, unspecified: Secondary | ICD-10-CM | POA: Diagnosis not present

## 2019-12-20 DIAGNOSIS — I959 Hypotension, unspecified: Secondary | ICD-10-CM | POA: Diagnosis not present

## 2019-12-20 DIAGNOSIS — R531 Weakness: Secondary | ICD-10-CM

## 2019-12-20 DIAGNOSIS — Z8674 Personal history of sudden cardiac arrest: Secondary | ICD-10-CM | POA: Diagnosis not present

## 2019-12-20 DIAGNOSIS — G8911 Acute pain due to trauma: Secondary | ICD-10-CM | POA: Diagnosis not present

## 2019-12-20 DIAGNOSIS — Z8673 Personal history of transient ischemic attack (TIA), and cerebral infarction without residual deficits: Secondary | ICD-10-CM | POA: Diagnosis not present

## 2019-12-20 DIAGNOSIS — M25519 Pain in unspecified shoulder: Secondary | ICD-10-CM | POA: Diagnosis not present

## 2019-12-20 DIAGNOSIS — F4310 Post-traumatic stress disorder, unspecified: Secondary | ICD-10-CM | POA: Diagnosis not present

## 2019-12-20 DIAGNOSIS — R2689 Other abnormalities of gait and mobility: Secondary | ICD-10-CM | POA: Diagnosis not present

## 2019-12-20 DIAGNOSIS — F17210 Nicotine dependence, cigarettes, uncomplicated: Secondary | ICD-10-CM | POA: Diagnosis not present

## 2019-12-20 DIAGNOSIS — T796XXA Traumatic ischemia of muscle, initial encounter: Secondary | ICD-10-CM | POA: Diagnosis not present

## 2019-12-20 DIAGNOSIS — R52 Pain, unspecified: Secondary | ICD-10-CM | POA: Diagnosis not present

## 2019-12-20 DIAGNOSIS — R0902 Hypoxemia: Secondary | ICD-10-CM | POA: Diagnosis not present

## 2019-12-20 DIAGNOSIS — R296 Repeated falls: Secondary | ICD-10-CM | POA: Diagnosis not present

## 2019-12-20 DIAGNOSIS — I10 Essential (primary) hypertension: Secondary | ICD-10-CM | POA: Diagnosis not present

## 2019-12-20 DIAGNOSIS — E119 Type 2 diabetes mellitus without complications: Secondary | ICD-10-CM | POA: Diagnosis not present

## 2019-12-20 DIAGNOSIS — E079 Disorder of thyroid, unspecified: Secondary | ICD-10-CM | POA: Diagnosis not present

## 2019-12-20 NOTE — Telephone Encounter (Signed)
Patient was called this morning to begin Mychart video visit with Dr. Sheppard Coil. Patient informed she fell out of bed at 2am, and was unable to get up to pull the emergency button. The fire department was present at the time of phone call reported the patient's blood pressure of 102/64 with a pulse of 101. Temperature reading was 99.2. Per Dr. Sheppard Coil patient advised to be evaluated at the ER. When advised of MD orders EMT was present and confirmed orders given. Patient verbalized her understanding.

## 2019-12-23 NOTE — Progress Notes (Signed)
See phone note 12/20/19 from [place]

## 2019-12-24 ENCOUNTER — Telehealth (INDEPENDENT_AMBULATORY_CARE_PROVIDER_SITE_OTHER): Payer: Medicare HMO | Admitting: Osteopathic Medicine

## 2019-12-24 ENCOUNTER — Telehealth: Payer: Medicare HMO | Admitting: Medical-Surgical

## 2019-12-24 DIAGNOSIS — R300 Dysuria: Secondary | ICD-10-CM | POA: Diagnosis not present

## 2019-12-24 DIAGNOSIS — K5730 Diverticulosis of large intestine without perforation or abscess without bleeding: Secondary | ICD-10-CM | POA: Diagnosis not present

## 2019-12-24 DIAGNOSIS — R7401 Elevation of levels of liver transaminase levels: Secondary | ICD-10-CM | POA: Diagnosis not present

## 2019-12-24 DIAGNOSIS — N393 Stress incontinence (female) (male): Secondary | ICD-10-CM | POA: Diagnosis not present

## 2019-12-24 DIAGNOSIS — Z9109 Other allergy status, other than to drugs and biological substances: Secondary | ICD-10-CM | POA: Diagnosis not present

## 2019-12-24 DIAGNOSIS — R1030 Lower abdominal pain, unspecified: Secondary | ICD-10-CM | POA: Diagnosis not present

## 2019-12-24 DIAGNOSIS — Z79899 Other long term (current) drug therapy: Secondary | ICD-10-CM | POA: Diagnosis not present

## 2019-12-24 DIAGNOSIS — R531 Weakness: Secondary | ICD-10-CM | POA: Diagnosis not present

## 2019-12-24 DIAGNOSIS — T796XXA Traumatic ischemia of muscle, initial encounter: Secondary | ICD-10-CM | POA: Diagnosis not present

## 2019-12-24 DIAGNOSIS — Z91030 Bee allergy status: Secondary | ICD-10-CM | POA: Diagnosis not present

## 2019-12-24 DIAGNOSIS — Z885 Allergy status to narcotic agent status: Secondary | ICD-10-CM | POA: Diagnosis not present

## 2019-12-24 DIAGNOSIS — N281 Cyst of kidney, acquired: Secondary | ICD-10-CM | POA: Diagnosis not present

## 2019-12-24 DIAGNOSIS — Z7982 Long term (current) use of aspirin: Secondary | ICD-10-CM | POA: Diagnosis not present

## 2019-12-24 DIAGNOSIS — Z880 Allergy status to penicillin: Secondary | ICD-10-CM | POA: Diagnosis not present

## 2019-12-24 DIAGNOSIS — Z888 Allergy status to other drugs, medicaments and biological substances status: Secondary | ICD-10-CM | POA: Diagnosis not present

## 2019-12-24 DIAGNOSIS — Z794 Long term (current) use of insulin: Secondary | ICD-10-CM | POA: Diagnosis not present

## 2019-12-24 NOTE — Progress Notes (Signed)
Virtual Visit via Phone Note  I connected with      [name] on 12/24/19 at 11:21 AM  by a telemedicine application and verified that I am speaking with the correct person using two identifiers.  Patient is at home I am in office   I discussed the limitations of evaluation and management by telemedicine and the availability of in person appointments. The patient expressed understanding and agreed to proceed.  History of Present Illness: Autumn Bowen is a 74 y.o. female who would like to discuss  Hospital follow up   Patient reports she fell out of bed in the middle of the night on Thursday night. She was unable to get up at that time and it took until 8am the next morning to pull her alert cord. She was evaluated by EMTs and taken to the ER.   ED records reviewed. Her labs showed her CK was elevated, consistent with rhabdomyolysis from the fall. Her LFTs were also elevated, but her other labs and CT head/CT C spine were normal. She states she was told at that time she should be admitted, but she didn't feel comfortable with that, so was discharged home and recommended to follow up with [place] or return to the ED if her condition worsened. She states she did not understand what the ED doctor was explaining to her, so she wanted to go home and speak with me...   Since that time, she states she has had an intermittently elevated temperature (Tmax 104F), severe body aches, and increased fatigue/generalized weakness. She has also noted darker urine (tea-colored) and states she has had increased urinary incontinence, especially on standing. She endorses sharp pains to her lower abdomen. She denies lower back pain or bowel incontinence. She states she has not had any other falls since going to the ED.      Observations/Objective: LMP 09/21/1975  BP Readings from Last 3 Encounters:  12/04/19 100/63  11/27/19 102/66  10/23/19 107/73   Exam: Normal Speech.   Lab and Radiology  Results No results found for this or any previous visit (from the past 72 hour(s)). No results found.     Assessment and Plan: 74 y.o. female with The primary encounter diagnosis was Traumatic rhabdomyolysis, initial encounter ([place]). Diagnoses of Transaminitis, Weakness, and Stress incontinence were also pertinent to this visit.  Traumatic Rhabdomyolysis  Discussed evaluation options with patient including returning to the hospital vs doing outpatient testing in this office. We discussed that testing and evaluation here will take longer than in the hospital and if results are concerning, she may need to return to the hospital anyway. Patient was amenable with returning to the ED in order to get more rapid workup.  Will contact [facility] to give summary of patient situation. Spoke to ED triage 11:46 AM. Advised pt left ED presumed AMA few days ago, needs reevaluation for Rhabdo, possible AKI, LFTs recheck, UTI vs lumbar trauma given incontinence, any other eval they feel is needed. Patient counseled to follow the advice of her treating team in the ER but always ask questions if she feels she does not have a good understanding of her options.    PDMP not reviewed this encounter. No orders of the defined types were placed in this encounter.  No orders of the defined types were placed in this encounter.  There are no Patient Instructions on file for this visit.  Instructions sent via MyChart. If MyChart not available, pt was  given option for info via personal e-mail w/ no guarantee of protected health info over unsecured e-mail communication, and MyChart sign-up instructions were sent to patient.   Follow Up Instructions: No follow-ups on file.    I discussed the assessment and treatment plan with the patient. The patient was provided an opportunity to ask questions and all were answered. The patient agreed with the plan and demonstrated an understanding of the instructions.    The patient was advised to call back or seek an in-person evaluation if any new concerns, if symptoms worsen or if the condition fails to improve as anticipated.  40 minutes of non-face-to-face time was provided during this encounter.      Historical information moved to improve visibility of documentation.  Past Medical History:  Diagnosis Date   Allergy    Notes that grass and some trees causes eyes to sting, burn, and water.   Anemia    Celiac disease    Celiac disease    COPD (chronic obstructive pulmonary disease) (HCC)    Diabetes mellitus type II    Diverticulitis    Dyslipidemia    HOH (hard of hearing)    Hypertension    Hypothyroidism    Sleep apnea    Sore throat    Past Surgical History:  Procedure Laterality Date   APPENDECTOMY     BREAST SURGERY     h/o benign cystleft breast and lymp node removal on right breast area.   EYE SURGERY     Left eye cataract removal, right eye h/o macular degeneration.   INTRAVASCULAR PRESSURE WIRE/FFR STUDY N/A 12/07/2017   Procedure: INTRAVASCULAR PRESSURE WIRE/FFR STUDY;  Surgeon: Leonie Man, MD;  Location: [facility];  Service: Cardiovascular;  Laterality: N/A;   LEFT HEART CATH AND CORONARY ANGIOGRAPHY N/A 12/07/2017   Procedure: LEFT HEART CATH AND CORONARY ANGIOGRAPHY;  Surgeon: Leonie Man, MD;  Location: [facility];  Service: Cardiovascular;  Laterality: N/A;   SPINE SURGERY     Pt not sure of the type of surgery she had but knows it was L4-5.   VAGINAL HYSTERECTOMY     Social History   Tobacco Use   Smoking status: Current Some Day Smoker    Packs/day: 0.25    Years: 51.00    Pack years: 12.75    Types: Cigarettes    Start date: 01/02/2012   Smokeless tobacco: Never Used   Tobacco comment: Been down to 4 - 6 cigs a day  Substance Use Topics   Alcohol use: No   family history includes Alcohol abuse in her brother; Anxiety disorder in her mother; Asthma in her  son; COPD in her father and sister; Cancer in her sister; Depression in her mother; Diabetes in her father and son; Diabetes type II in her mother; Drug abuse in her brother; Glaucoma in her father; Hashimoto's thyroiditis in her sister; Heart attack in her mother; Hypertension in her father; Kidney disease in her sister; Stroke in her mother.  Medications: Current Outpatient Medications  Medication Sig Dispense Refill   acetaminophen (TYLENOL) 500 MG tablet Take 1,000 mg by mouth every 6 (six) hours as needed for mild pain or moderate pain.     albuterol (PROVENTIL HFA;VENTOLIN HFA) 108 (90 Base) MCG/ACT inhaler Inhale 2 puffs into the lungs every 4 (four) hours as needed for wheezing. 2 Inhaler 11   aspirin EC 81 MG tablet Take 1 tablet (81 mg total) by mouth daily. 90 tablet 3  cholecalciferol (VITAMIN D) 1000 units tablet Take 1,000 Units by mouth daily.     colchicine 0.6 MG tablet Take 1 tablet (0.6 mg total) by mouth daily. 90 tablet 3   diclofenac sodium (VOLTAREN) 1 % GEL Apply 4 g topically 4 (four) times daily. To affected joint. 100 g 11   dicyclomine (BENTYL) 10 MG capsule Take 1 capsule (10 mg total) by mouth 2 (two) times daily. 180 capsule 3   empagliflozin (JARDIANCE) 10 MG TABS tablet Take 10 mg by mouth daily. 90 tablet 1   EPINEPHrine (EPIPEN 2-PAK) 0.3 mg/0.3 mL IJ SOAJ injection Inject 0.3 mLs (0.3 mg total) into the muscle as needed for anaphylaxis. 1 Device prn   famotidine (PEPCID) 20 MG tablet TAKE 1 TABLET(20 MG TOTAL) BY MOUTH DAILY. 90 tablet 1   fluconazole (DIFLUCAN) 150 MG tablet Once today, repeat in 3 days, then repeat once per week for 4 weeks 6 tablet 0   Fluticasone-Salmeterol (ADVAIR DISKUS) 250-50 MCG/DOSE AEPB Inhale 1 puff into the lungs 2 (two) times daily. 60 each 12   gabapentin (NEURONTIN) 300 MG capsule Take 670m TID.  May increase to 9047mTID if needed. 180 capsule 3   GLUCOSAMINE-CHONDROITIN PO Take 1 tablet by mouth 2 (two) times  daily.      ibuprofen (ADVIL) 800 MG tablet Take 1 tablet (800 mg total) by mouth every 8 (eight) hours as needed (pain). 30 tablet 0   Insulin Degludec (TRESIBA FLEXTOUCH) 200 UNIT/ML SOPN Inject 28-60 Units into the skin at bedtime. 15 mL 99   isosorbide mononitrate (IMDUR) 30 MG 24 hr tablet Take 1 tablet (30 mg total) by mouth daily. Need office visit w/ cardiology for more refills 90 tablet 3   levothyroxine (SYNTHROID) 175 MCG tablet Take 1 tablet (175 mcg total) by mouth daily before breakfast. LAB RECHECK AROUND 01/27/20 90 tablet 0   LUMIGAN 0.01 % SOLN Instill 1 drop into both eyes every night  3   meclizine (ANTIVERT) 25 MG tablet Take 1 tablet (25 mg total) by mouth 3 (three) times daily. 90 tablet 3   meloxicam (MOBIC) 15 MG tablet Take 1 tablet (15 mg total) by mouth daily. Take with food 90 tablet 0   metFORMIN (GLUCOPHAGE-XR) 750 MG 24 hr tablet Take one tablet by mouth daily with breakfast. 90 tablet 1   Multiple Vitamins-Minerals (PRESERVISION AREDS 2 PO) Take 1 capsule by mouth 2 (two) times daily.     nitroGLYCERIN (NITROSTAT) 0.4 MG SL tablet Place 1 tablet (0.4 mg total) under the tongue every 5 (five) minutes as needed for chest pain. 25 tablet 11   predniSONE (STERAPRED UNI-PAK 48 TAB) 10 MG (48) TBPK tablet Take by mouth daily. 12-Day taper, po 48 tablet 0   QUEtiapine (SEROQUEL) 200 MG tablet Take 1 tablet (200 mg total) by mouth at bedtime. 90 tablet 0   rosuvastatin (CRESTOR) 40 MG tablet TAKE 1 TABLET(40 MG) BY MOUTH DAILY 90 tablet 3   sertraline (ZOLOFT) 100 MG tablet Take 2 tablets (200 mg total) by mouth daily. 180 tablet 0   traMADol (ULTRAM) 50 MG tablet Take 1-2 tablets (50-100 mg total) by mouth every 8 (eight) hours as needed for moderate pain. Maximum 6 tabs per day. 21 tablet 0   No current facility-administered medications for this visit.   Allergies  Allergen Reactions   Bee Venom Anaphylaxis   Iodinated Diagnostic Agents Other (See  Comments)    Per patient cardiac arrest [facility]  Penicillins Anaphylaxis    Has patient had a PCN reaction causing immediate rash, facial/tongue/throat swelling, SOB or lightheadedness with hypotension: yes Has patient had a PCN reaction causing severe rash involving mucus membranes or skin necrosis: no Has patient had a PCN reaction that required hospitalization: yes Has patient had a PCN reaction occurring within the last 10 years: no If all of the above answers are "NO", then may proceed with Cephalosporin use.    Povidone Iodine Itching and Rash   Tomato Itching   Chocolate Rash   Latex Rash   Other Rash   Quetiapine Fumarate Other (See Comments)    Per pt she can not take XR    Trazodone And Nefazodone Hives   Zolpidem Tartrate Nausea And Vomiting   Gluten Meal    Zolpidem Tartrate Nausea And Vomiting   Atorvastatin Calcium Nausea Only   Esomeprazole Magnesium Nausea Only   Metformin And Related Diarrhea    Total time spent 40 minutes                                                                  Gweneth Dimitri, [facility] MS3

## 2020-01-01 ENCOUNTER — Other Ambulatory Visit: Payer: Self-pay

## 2020-01-01 ENCOUNTER — Ambulatory Visit (INDEPENDENT_AMBULATORY_CARE_PROVIDER_SITE_OTHER): Payer: Medicare HMO | Admitting: Osteopathic Medicine

## 2020-01-01 ENCOUNTER — Encounter: Payer: Self-pay | Admitting: Osteopathic Medicine

## 2020-01-01 VITALS — BP 112/69 | HR 90 | Wt 195.0 lb

## 2020-01-01 DIAGNOSIS — E039 Hypothyroidism, unspecified: Secondary | ICD-10-CM

## 2020-01-01 DIAGNOSIS — N393 Stress incontinence (female) (male): Secondary | ICD-10-CM | POA: Diagnosis not present

## 2020-01-01 DIAGNOSIS — R7401 Elevation of levels of liver transaminase levels: Secondary | ICD-10-CM

## 2020-01-01 DIAGNOSIS — B373 Candidiasis of vulva and vagina: Secondary | ICD-10-CM | POA: Diagnosis not present

## 2020-01-01 DIAGNOSIS — M25512 Pain in left shoulder: Secondary | ICD-10-CM

## 2020-01-01 DIAGNOSIS — R531 Weakness: Secondary | ICD-10-CM

## 2020-01-01 DIAGNOSIS — B3731 Acute candidiasis of vulva and vagina: Secondary | ICD-10-CM

## 2020-01-01 DIAGNOSIS — T796XXA Traumatic ischemia of muscle, initial encounter: Secondary | ICD-10-CM | POA: Diagnosis not present

## 2020-01-01 DIAGNOSIS — E118 Type 2 diabetes mellitus with unspecified complications: Secondary | ICD-10-CM

## 2020-01-01 MED ORDER — IBUPROFEN 800 MG PO TABS: 800.0000 mg | ORAL_TABLET | Freq: Three times a day (TID) | ORAL | 0 refills | Status: DC | PRN

## 2020-01-01 MED ORDER — AMBULATORY NON FORMULARY MEDICATION: 99 refills | Status: DC

## 2020-01-01 NOTE — Patient Instructions (Addendum)
For shoulder - please follow up as directed with Dr Griffin Basil, the orthopedic surgeon that Dr T referred you to. [phone]  Will get labs today to check up on muscles, kidney, liver, sugars.   See attached for Rx for diapers. With stress incontinence, there are surgical procedures which can help. I referred you to a urologist to discuss options.   Will star you on weekly Diflucan for yeast. Please do your best to keep groin area clean and dry.   Changed pharmacy to [facility]

## 2020-01-01 NOTE — Progress Notes (Signed)
HPI: Autumn Bowen is a 74 y.o. female who  has a past medical history of Allergy, Anemia, Celiac disease, Celiac disease, COPD (chronic obstructive pulmonary disease) ([place]), Diabetes mellitus type II, Diverticulitis, Dyslipidemia, HOH (hard of hearing), Hypertension, Hypothyroidism, Sleep apnea, and Sore throat.  she presents to [facility] today, 01/01/20,  for chief complaint of: Urinary incontinence, yeast infection, Follow up on traumatic rhabdomyolysis, DM  Patient reports she is feeling mildly improved since her ED visit last week for traumatic rhabdomyolysis. She has not had any more fevers and her urine color has lightened. She still endorses diffuse muscle aches, despite hydrating regularly, and fatigue. She states she can only be awake for 1-2 hours before she needs a nap. [facility]  She also reported new urinary incontinence at her visit last week. She states this has continued, and every time she stands up or coughs/sneezes, she will urinate. She also states it feels like her bladder is not emptying completely and she has to sit on the toilet for a while. She states she saw a urologist many years ago and had a "bladder repair."  She also notes ongoing symptoms from her chronic conditions. She notes ongoing vaginal itching from a chronic yeast infection. She states she has felt unbalanced for a couple years now and has used a rollator for about a year now, but recently this has worsened. She also endorses ongoing left shoulder pain, which she has seen our sports medicine practitioner about and has been referred to an orthopedic surgeon for, but she says she has not gotten an appointment with them yet.    Past medical, surgical, social and family history reviewed:  Patient Active Problem List   Diagnosis Date Noted  . Primary osteoarthritis of left shoulder 12/04/2019  . Dysuria 08/29/2019  . Spinal stenosis of  lumbosacral region 03/19/2019  . HTN (hypertension) 02/08/2019  . Injury of left knee 02/21/2018  . Microalbuminuria due to type 2 diabetes mellitus ([place]) 02/21/2018  . Hyperlipidemia associated with type 2 diabetes mellitus ([place]) 02/21/2018  . CAD (coronary artery disease) 01/01/2018  . Chronic pain syndrome 12/13/2017  . Angina pectoris ([place]) 12/07/2017  . Diabetes mellitus due to underlying condition with unspecified complications ([place]) 99/35/7017  . Radiographic dye allergy status 12/05/2017  . Pseudogout of left knee 11/16/2017  . Diverticulitis of colon 04/12/2016  . Broken or cracked tooth, nontraumatic 03/27/2016  . Memory deficit 05/05/2015  . Hearing difficulty of both ears 05/05/2015  . OSA (obstructive sleep apnea) 04/28/2015  . Seborrheic keratoses 01/31/2015  . Left-sided low back pain with left-sided sciatica 01/31/2015  . GERD (gastroesophageal reflux disease) 01/26/2015  . Celiac disease 01/26/2015  . Cervical spondylosis 12/04/2014  . Osteoarthritis of first metatarsophalangeal joint 11/17/2014  . Seasonal allergies 10/04/2014  . Neuropathy 10/04/2014  . Toenail fungus 10/03/2014  . Adenomatous polyp of colon 07/30/2014  . Macular degeneration, dry 07/29/2014  . Type 2 diabetes mellitus with complication ([place]) 79/39/0300  . Migraine without aura and without status migrainosus, not intractable 07/28/2014  . COPD (chronic obstructive pulmonary disease) with chronic bronchitis ([place]) 05/10/2009  . VISION DISORDER 12/03/2008  . OSTEOPENIA 11/18/2008  . VITAMIN D DEFICIENCY 11/06/2008  . Major depressive disorder 11/05/2008  . FATIGUE 11/05/2008  . ANEMIA 10/23/2008  . DYSLIPIDEMIA 10/16/2008  . TOBACCO ABUSE 10/15/2008  . ALLERGIC RHINITIS 10/15/2008  . POST TRAUMATIC STRESS SYNDROME 05/30/2000  . Hypothyroidism 05/30/1988  . POSTMENOPAUSAL STATUS 05/30/1968  Past Surgical History:  Procedure Laterality Date  . APPENDECTOMY    . BREAST SURGERY     h/o  benign cystleft breast and lymp node removal on right breast area.  Marland Kitchen EYE SURGERY     Left eye cataract removal, right eye h/o macular degeneration.  . INTRAVASCULAR PRESSURE WIRE/FFR STUDY N/A 12/07/2017   Procedure: INTRAVASCULAR PRESSURE WIRE/FFR STUDY;  Surgeon: Leonie Man, MD;  Location: [facility];  Service: Cardiovascular;  Laterality: N/A;  . LEFT HEART CATH AND CORONARY ANGIOGRAPHY N/A 12/07/2017   Procedure: LEFT HEART CATH AND CORONARY ANGIOGRAPHY;  Surgeon: Leonie Man, MD;  Location: [facility];  Service: Cardiovascular;  Laterality: N/A;  . SPINE SURGERY     Pt not sure of the type of surgery she had but knows it was L4-5.  Marland Kitchen VAGINAL HYSTERECTOMY      Social History   Tobacco Use  . Smoking status: Current Some Day Smoker    Packs/day: 0.25    Years: 51.00    Pack years: 12.75    Types: Cigarettes    Start date: 01/02/2012  . Smokeless tobacco: Never Used  . Tobacco comment: Been down to 4 - 6 cigs a day  Substance Use Topics  . Alcohol use: No    Family History  Problem Relation Age of Onset  . Anxiety disorder Mother   . Depression Mother   . Heart attack Mother   . Stroke Mother   . Diabetes type II Mother   . Diabetes Father   . Glaucoma Father   . Hypertension Father   . COPD Father   . COPD Sister   . Hashimoto's thyroiditis Sister   . Cancer Sister   . Kidney disease Sister   . Drug abuse Brother   . Alcohol abuse Brother   . Asthma Son   . Diabetes Son      Current medication list and allergy/intolerance information reviewed:    Current Outpatient Medications  Medication Sig Dispense Refill  . acetaminophen (TYLENOL) 500 MG tablet Take 1,000 mg by mouth every 6 (six) hours as needed for mild pain or moderate pain.    Marland Kitchen albuterol (PROVENTIL HFA;VENTOLIN HFA) 108 (90 Base) MCG/ACT inhaler Inhale 2 puffs into the lungs every 4 (four) hours as needed for wheezing. 2 Inhaler 11  . aspirin EC 81 MG tablet Take 1 tablet (81  mg total) by mouth daily. 90 tablet 3  . cholecalciferol (VITAMIN D) 1000 units tablet Take 1,000 Units by mouth daily.    . colchicine 0.6 MG tablet Take 1 tablet (0.6 mg total) by mouth daily. 90 tablet 3  . diclofenac sodium (VOLTAREN) 1 % GEL Apply 4 g topically 4 (four) times daily. To affected joint. 100 g 11  . dicyclomine (BENTYL) 10 MG capsule Take 1 capsule (10 mg total) by mouth 2 (two) times daily. 180 capsule 3  . empagliflozin (JARDIANCE) 10 MG TABS tablet Take 10 mg by mouth daily. 90 tablet 1  . EPINEPHrine (EPIPEN 2-PAK) 0.3 mg/0.3 mL IJ SOAJ injection Inject 0.3 mLs (0.3 mg total) into the muscle as needed for anaphylaxis. 1 Device prn  . famotidine (PEPCID) 20 MG tablet TAKE 1 TABLET(20 MG TOTAL) BY MOUTH DAILY. 90 tablet 1  . fluconazole (DIFLUCAN) 150 MG tablet Once today, repeat in 3 days, then repeat once per week for 4 weeks 6 tablet 0  . Fluticasone-Salmeterol (ADVAIR DISKUS) 250-50 MCG/DOSE AEPB Inhale 1 puff into the lungs 2 (  two) times daily. 60 each 12  . gabapentin (NEURONTIN) 300 MG capsule Take 662m TID.  May increase to 9051mTID if needed. 180 capsule 3  . GLUCOSAMINE-CHONDROITIN PO Take 1 tablet by mouth 2 (two) times daily.     . Marland Kitchenbuprofen (ADVIL) 800 MG tablet Take 1 tablet (800 mg total) by mouth every 8 (eight) hours as needed (pain). 30 tablet 0  . Insulin Degludec (TRESIBA FLEXTOUCH) 200 UNIT/ML SOPN Inject 28-60 Units into the skin at bedtime. 15 mL 99  . isosorbide mononitrate (IMDUR) 30 MG 24 hr tablet Take 1 tablet (30 mg total) by mouth daily. Need office visit w/ cardiology for more refills 90 tablet 3  . levothyroxine (SYNTHROID) 175 MCG tablet Take 1 tablet (175 mcg total) by mouth daily before breakfast. LAB RECHECK AROUND 01/27/20 90 tablet 0  . LUMIGAN 0.01 % SOLN Instill 1 drop into both eyes every night  3  . metFORMIN (GLUCOPHAGE-XR) 750 MG 24 hr tablet Take one tablet by mouth daily with breakfast. 90 tablet 1  . Multiple Vitamins-Minerals  (PRESERVISION AREDS 2 PO) Take 1 capsule by mouth 2 (two) times daily.    . predniSONE (STERAPRED UNI-PAK 48 TAB) 10 MG (48) TBPK tablet Take by mouth daily. 12-Day taper, po 48 tablet 0  . QUEtiapine (SEROQUEL) 200 MG tablet Take 1 tablet (200 mg total) by mouth at bedtime. 90 tablet 0  . rosuvastatin (CRESTOR) 40 MG tablet TAKE 1 TABLET(40 MG) BY MOUTH DAILY 90 tablet 3  . sertraline (ZOLOFT) 100 MG tablet Take 2 tablets (200 mg total) by mouth daily. 180 tablet 0  . AMBULATORY NON FORMULARY MEDICATION Diapers Dx Stress urinary incontinence - size and brand per patient preference and insurance coverage, QS 90 days w/ refill x99 100 Units 99  . meclizine (ANTIVERT) 25 MG tablet Take 1 tablet (25 mg total) by mouth 3 (three) times daily. (Patient not taking: Reported on 01/01/2020) 90 tablet 3  . meloxicam (MOBIC) 15 MG tablet Take 1 tablet (15 mg total) by mouth daily. Take with food (Patient not taking: Reported on 01/01/2020) 90 tablet 0  . nitroGLYCERIN (NITROSTAT) 0.4 MG SL tablet Place 1 tablet (0.4 mg total) under the tongue every 5 (five) minutes as needed for chest pain. 25 tablet 11  . traMADol (ULTRAM) 50 MG tablet Take 1-2 tablets (50-100 mg total) by mouth every 8 (eight) hours as needed for moderate pain. Maximum 6 tabs per day. (Patient not taking: Reported on 01/01/2020) 21 tablet 0   No current facility-administered medications for this visit.    Allergies  Allergen Reactions  . Bee Venom Anaphylaxis  . Iodinated Diagnostic Agents Other (See Comments)    Per patient cardiac arrest IVP DYE-CARDIAC ARREST  . Penicillins Anaphylaxis    Has patient had a PCN reaction causing immediate rash, facial/tongue/throat swelling, SOB or lightheadedness with hypotension: yes Has patient had a PCN reaction causing severe rash involving mucus membranes or skin necrosis: no Has patient had a PCN reaction that required hospitalization: yes Has patient had a PCN reaction occurring within the last 10  years: no If all of the above answers are "NO", then may proceed with Cephalosporin use.   . Povidone Iodine Itching and Rash  . Tomato Itching  . Chocolate Rash  . Latex Rash  . Other Rash  . Quetiapine Fumarate Other (See Comments)    Per pt she can not take XR   . Trazodone And Nefazodone Hives  . Zolpidem Tartrate Nausea  And Vomiting  . Gluten Meal   . Zolpidem Tartrate Nausea And Vomiting  . Atorvastatin Calcium Nausea Only  . Esomeprazole Magnesium Nausea Only  . Metformin And Related Diarrhea      Review of Systems:  Constitutional:  No  fever, no chills, + significant fatigue.   HEENT: No  headache, no vision change  Cardiac: No  chest pain  Respiratory:  No  shortness of breath. No  Cough  Gastrointestinal: No  abdominal pain  Musculoskeletal: No new myalgia/arthralgia  Genitourinary: +  incontinence, + vaginal itching, No  abnormal genital bleeding, No abnormal genital discharg  Neurologic: +generalized weakness  Psychiatric: No  concerns with depression, No  concerns with anxiety, No sleep problems, No mood problems  Exam:  BP 112/69 (BP Location: Left Arm, Patient Position: Sitting)   Pulse 90   Wt 195 lb (88.5 kg)   LMP 09/21/1974   SpO2 93%   BMI 34.54 kg/m   Constitutional: VS see above. General Appearance: alert, well-developed, well-nourished, NAD  Eyes: Normal lids and conjunctive, non-icteric sclera  Neck: No masses, trachea midline.   Respiratory: Normal respiratory effort. no wheeze, no rhonchi, no rales  Cardiovascular: S1/S2 normal, no murmur, no rub/gallop auscultated. RRR.  Gastrointestinal: Nontender, no masses.  Musculoskeletal: Gait slow, but steady. No clubbing/cyanosis of digits.   Neurological: Normal balance/coordination. No tremor. No cranial nerve deficit on limited exam.  Skin: warm, dry, intact. No rash/ulcer. No concerning nevi or subq nodules on limited exam.   Psychiatric: Normal judgment/insight. Normal mood  and affect. Oriented x3.    No results found for this or any previous visit (from the past 72 hour(s)).  No results found.       ASSESSMENT/PLAN: The primary encounter diagnosis was Traumatic rhabdomyolysis, initial encounter ([place]). Diagnoses of Stress incontinence, Transaminitis, Weakness, Vaginal yeast infection, Hypothyroidism, unspecified type, and Type 2 diabetes mellitus with complication ([place]) were also pertinent to this visit.   Traumatic rhabdomyolysis follow up  CK, creatinine and LFTs had improved at her last ED visit, though her LFTs are still elevated.  Advised patient that it may take several weeks of hydration and activity to completely recover from this incident.  Ordered CBC, CMP, and CK to follow up on recent ED visit, as she may require further workup if abnormalities are still present.  Would have low threshold for PT referral   Stress incontinence  Pt description of symptoms consistent with stress incontinence. Given persistence of symptoms and surgical history in this area, referred patient to urology for follow up.  Ordered UA and urine culture to follow up on recent UTI and incontinence  Wrote prescription for diapers/pads for patient's incontinence  Vaginal yeast infection  Symptoms still present after 30 days of diflucan. Prescribed weekly fluconazole for continued treatment/prevention. Hygiene importance emphasized   Routine health maintenance  Ordered HbA1c with labs today. Will follow up based on results.  Ordered TSH and T4 to follow up on hypothyroidism.  If no other concerns come up, patient can follow up for A1c recheck in 4 months.  Orders Placed This Encounter  Procedures  . Urine Culture  . CBC  . COMPLETE METABOLIC PANEL WITH GFR  . TSH  . T4, free  . Hemoglobin A1c  . Urinalysis, Routine w reflex microscopic  . CK    Meds ordered this encounter  Medications  . AMBULATORY NON FORMULARY MEDICATION    Sig: Diapers Dx  Stress urinary incontinence - size and brand per patient preference and  insurance coverage, QS 90 days w/ refill x99    Dispense:  100 Units    Refill:  99    Patient Instructions  For shoulder - please follow up as directed with Dr Griffin Basil, the orthopedic surgeon that Dr T referred you to. [phone]  Will get labs today to check up on muscles, kidney, liver, sugars.   See attached for Rx for diapers. With stress incontinence, there are surgical procedures which can help. I referred you to a urologist to discuss options.   Will star you on weekly Diflucan for yeast. Please do your best to keep groin area clean and dry.   Changed pharmacy to [facility]         Visit summary with medication list and pertinent instructions was printed for patient to review. All questions at time of visit were answered - patient instructed to contact office with any additional concerns or updates. ER/RTC precautions were reviewed with the patient.   Note: Total time spent 40 minutes, greater than 50% of the visit was spent face-to-face counseling and coordinating care for the above diagnoses listed in assessment/plan.   Please note: voice recognition software was used to produce this document, and typos may escape review. Please contact Dr. Sheppard Coil for any needed clarifications.     Follow-up plan: Return in about 4 months (around 05/02/2020) for MONITOR LABS, DIABETES, ETC. SEE [place] SOONER IF NEEDED. Marland Kitchen  Gweneth Dimitri, [facility] MS3

## 2020-01-03 LAB — COMPLETE METABOLIC PANEL WITH GFR
AG Ratio: 1.2 (calc) (ref 1.0–2.5)
ALT: 39 U/L — ABNORMAL HIGH (ref 6–29)
AST: 29 U/L (ref 10–35)
Albumin: 3.5 g/dL — ABNORMAL LOW (ref 3.6–5.1)
Alkaline phosphatase (APISO): 261 U/L — ABNORMAL HIGH (ref 37–153)
BUN: 16 mg/dL (ref 7–25)
CO2: 33 mmol/L — ABNORMAL HIGH (ref 20–32)
Calcium: 10.4 mg/dL (ref 8.6–10.4)
Chloride: 100 mmol/L (ref 98–110)
Creat: 0.65 mg/dL (ref 0.60–0.93)
GFR, Est African American: 101 mL/min/{1.73_m2} (ref >=60)
GFR, Est Non African American: 87 mL/min/{1.73_m2} (ref >=60)
Globulin: 2.9 g/dL (calc) (ref 1.9–3.7)
Glucose, Bld: 118 mg/dL (ref 65–139)
Potassium: 5.3 mmol/L (ref 3.5–5.3)
Sodium: 141 mmol/L (ref 135–146)
Total Bilirubin: 0.5 mg/dL (ref 0.2–1.2)
Total Protein: 6.4 g/dL (ref 6.1–8.1)

## 2020-01-03 LAB — CBC
HCT: 43.9 % (ref 35.0–45.0)
Hemoglobin: 14 g/dL (ref 11.7–15.5)
MCH: 29.5 pg (ref 27.0–33.0)
MCHC: 31.9 g/dL — ABNORMAL LOW (ref 32.0–36.0)
MCV: 92.4 fL (ref 80.0–100.0)
MPV: 9.9 fL (ref 7.5–12.5)
Platelets: 510 10*3/uL — ABNORMAL HIGH (ref 140–400)
RBC: 4.75 10*6/uL (ref 3.80–5.10)
RDW: 13.9 % (ref 11.0–15.0)
WBC: 9.2 10*3/uL (ref 3.8–10.8)

## 2020-01-03 LAB — URINE CULTURE
MICRO NUMBER:: 10787914
SPECIMEN QUALITY:: ADEQUATE

## 2020-01-03 LAB — HEMOGLOBIN A1C
Hgb A1c MFr Bld: 7.5 % of total Hgb — ABNORMAL HIGH (ref <5.7)
Mean Plasma Glucose: 169 (calc)
eAG (mmol/L): 9.3 (calc)

## 2020-01-03 LAB — URINALYSIS, ROUTINE W REFLEX MICROSCOPIC
Bilirubin Urine: NEGATIVE
Hgb urine dipstick: NEGATIVE
Ketones, ur: NEGATIVE
Leukocytes,Ua: NEGATIVE
Nitrite: NEGATIVE
Protein, ur: NEGATIVE
Specific Gravity, Urine: 1.037 — ABNORMAL HIGH (ref 1.001–1.03)
pH: 5.5 (ref 5.0–8.0)

## 2020-01-03 LAB — CK: Total CK: 24 U/L — ABNORMAL LOW (ref 29–143)

## 2020-01-03 LAB — TSH: TSH: 1.16 mIU/L (ref 0.40–4.50)

## 2020-01-03 LAB — T4, FREE: Free T4: 1.3 ng/dL (ref 0.8–1.8)

## 2020-01-06 ENCOUNTER — Other Ambulatory Visit: Payer: Self-pay

## 2020-01-06 DIAGNOSIS — E7800 Pure hypercholesterolemia, unspecified: Secondary | ICD-10-CM

## 2020-01-06 MED ORDER — ISOSORBIDE MONONITRATE ER 30 MG PO TB24: 30.0000 mg | ORAL_TABLET | Freq: Every day | ORAL | 3 refills | Status: DC

## 2020-01-06 MED ORDER — NITROGLYCERIN 0.4 MG SL SUBL: 0.4000 mg | SUBLINGUAL_TABLET | SUBLINGUAL | 11 refills | Status: AC | PRN

## 2020-01-06 MED ORDER — GABAPENTIN 300 MG PO CAPS: ORAL_CAPSULE | ORAL | 3 refills | Status: DC

## 2020-01-06 MED ORDER — ROSUVASTATIN CALCIUM 40 MG PO TABS: ORAL_TABLET | ORAL | 3 refills | Status: DC

## 2020-01-07 ENCOUNTER — Other Ambulatory Visit: Payer: Self-pay

## 2020-01-08 ENCOUNTER — Other Ambulatory Visit: Payer: Self-pay

## 2020-01-08 DIAGNOSIS — E118 Type 2 diabetes mellitus with unspecified complications: Secondary | ICD-10-CM

## 2020-01-08 DIAGNOSIS — E039 Hypothyroidism, unspecified: Secondary | ICD-10-CM

## 2020-01-08 DIAGNOSIS — K219 Gastro-esophageal reflux disease without esophagitis: Secondary | ICD-10-CM

## 2020-01-08 DIAGNOSIS — Z794 Long term (current) use of insulin: Secondary | ICD-10-CM

## 2020-01-08 MED ORDER — CIPROFLOXACIN HCL 500 MG PO TABS: 500.0000 mg | ORAL_TABLET | Freq: Two times a day (BID) | ORAL | 0 refills | Status: DC

## 2020-01-08 NOTE — Addendum Note (Signed)
Addended by: Maryla Morrow on: 01/08/2020 02:23 PM   Modules accepted: Orders

## 2020-01-10 ENCOUNTER — Other Ambulatory Visit: Payer: Self-pay

## 2020-01-10 DIAGNOSIS — M542 Cervicalgia: Secondary | ICD-10-CM

## 2020-01-10 DIAGNOSIS — F331 Major depressive disorder, recurrent, moderate: Secondary | ICD-10-CM

## 2020-01-10 MED ORDER — SERTRALINE HCL 100 MG PO TABS: 200.0000 mg | ORAL_TABLET | Freq: Every day | ORAL | 0 refills | Status: DC

## 2020-01-10 MED ORDER — MELOXICAM 15 MG PO TABS: 15.0000 mg | ORAL_TABLET | Freq: Every day | ORAL | 0 refills | Status: DC

## 2020-01-11 ENCOUNTER — Other Ambulatory Visit: Payer: Self-pay | Admitting: Osteopathic Medicine

## 2020-01-11 DIAGNOSIS — M25512 Pain in left shoulder: Secondary | ICD-10-CM

## 2020-01-15 ENCOUNTER — Other Ambulatory Visit: Payer: Self-pay

## 2020-01-15 DIAGNOSIS — M19012 Primary osteoarthritis, left shoulder: Secondary | ICD-10-CM

## 2020-01-15 NOTE — Telephone Encounter (Signed)
[employer] [facility] pharmacy requesting med refill for tramadol. Rx pended.

## 2020-01-16 ENCOUNTER — Other Ambulatory Visit: Payer: Self-pay | Admitting: Neurology

## 2020-01-16 DIAGNOSIS — R109 Unspecified abdominal pain: Secondary | ICD-10-CM

## 2020-01-16 MED ORDER — FAMOTIDINE 20 MG PO TABS
ORAL_TABLET | ORAL | 1 refills | Status: DC
Start: 2020-01-16 — End: 2020-01-24

## 2020-01-16 MED ORDER — TRAMADOL HCL 50 MG PO TABS: 50.0000 mg | ORAL_TABLET | Freq: Three times a day (TID) | ORAL | 0 refills | Status: DC | PRN

## 2020-01-16 MED ORDER — DICYCLOMINE HCL 10 MG PO CAPS: 10.0000 mg | ORAL_CAPSULE | Freq: Two times a day (BID) | ORAL | 3 refills | Status: DC

## 2020-01-16 MED ORDER — BD PEN NEEDLE NANO 2ND GEN 32G X 4 MM MISC: 1.0000 | 2 refills | Status: DC | PRN

## 2020-01-23 ENCOUNTER — Other Ambulatory Visit: Payer: Self-pay | Admitting: Osteopathic Medicine

## 2020-01-23 DIAGNOSIS — M25512 Pain in left shoulder: Secondary | ICD-10-CM

## 2020-01-24 ENCOUNTER — Other Ambulatory Visit: Payer: Self-pay

## 2020-01-24 DIAGNOSIS — R109 Unspecified abdominal pain: Secondary | ICD-10-CM

## 2020-01-24 MED ORDER — FAMOTIDINE 20 MG PO TABS: ORAL_TABLET | ORAL | 1 refills | Status: DC

## 2020-01-24 MED ORDER — DICYCLOMINE HCL 10 MG PO CAPS: 10.0000 mg | ORAL_CAPSULE | Freq: Two times a day (BID) | ORAL | 3 refills | Status: DC

## 2020-01-24 MED ORDER — BD PEN NEEDLE NANO 2ND GEN 32G X 4 MM MISC: 1.0000 | 2 refills | Status: DC | PRN

## 2020-01-27 ENCOUNTER — Other Ambulatory Visit: Payer: Self-pay

## 2020-01-27 ENCOUNTER — Encounter: Payer: Self-pay | Admitting: Nurse Practitioner

## 2020-01-27 ENCOUNTER — Telehealth: Payer: Self-pay | Admitting: Osteopathic Medicine

## 2020-01-27 ENCOUNTER — Ambulatory Visit (INDEPENDENT_AMBULATORY_CARE_PROVIDER_SITE_OTHER): Payer: Medicare HMO | Admitting: Nurse Practitioner

## 2020-01-27 VITALS — BP 96/64 | HR 92 | Temp 98.2°F | Ht 63.0 in | Wt 194.7 lb

## 2020-01-27 DIAGNOSIS — N3946 Mixed incontinence: Secondary | ICD-10-CM

## 2020-01-27 DIAGNOSIS — Z598 Other problems related to housing and economic circumstances: Secondary | ICD-10-CM | POA: Diagnosis not present

## 2020-01-27 DIAGNOSIS — Z794 Long term (current) use of insulin: Secondary | ICD-10-CM | POA: Diagnosis not present

## 2020-01-27 DIAGNOSIS — B373 Candidiasis of vulva and vagina: Secondary | ICD-10-CM | POA: Diagnosis not present

## 2020-01-27 DIAGNOSIS — B3731 Acute candidiasis of vulva and vagina: Secondary | ICD-10-CM | POA: Insufficient documentation

## 2020-01-27 DIAGNOSIS — Z599 Problem related to housing and economic circumstances, unspecified: Secondary | ICD-10-CM

## 2020-01-27 DIAGNOSIS — E118 Type 2 diabetes mellitus with unspecified complications: Secondary | ICD-10-CM

## 2020-01-27 LAB — GLUCOSE, POCT (MANUAL RESULT ENTRY): POC Glucose: 238 mg/dl — AB (ref 70–99)

## 2020-01-27 MED ORDER — FLUCONAZOLE 150 MG PO TABS: ORAL_TABLET | ORAL | 3 refills | Status: DC

## 2020-01-27 MED ORDER — AMBULATORY NON FORMULARY MEDICATION: 99 refills | Status: AC

## 2020-01-27 MED ORDER — TRESIBA FLEXTOUCH 200 UNIT/ML ~~LOC~~ SOPN: 30.0000 [IU] | PEN_INJECTOR | Freq: Every day | SUBCUTANEOUS | 99 refills | Status: DC

## 2020-01-27 MED ORDER — ALCOHOL SWABS 70 % PADS: 1.0000 [IU] | MEDICATED_PAD | Freq: Three times a day (TID) | 5 refills | Status: DC

## 2020-01-27 MED ORDER — METFORMIN HCL ER 750 MG PO TB24: ORAL_TABLET | ORAL | 1 refills | Status: DC

## 2020-01-27 MED ORDER — BD PEN NEEDLE NANO 2ND GEN 32G X 4 MM MISC: 1.0000 | 5 refills | Status: DC | PRN

## 2020-01-27 NOTE — Progress Notes (Signed)
Acute Office Visit  Subjective:    Patient ID: Autumn Bowen, female    DOB: [birth year], 74 y.o.   MRN: [id number]  Chief Complaint  Patient presents with  . Vaginal Discharge    recently treated for yeast infection, finished fluconazole 10 days ago, yeast infection has returned    HPI  VAGINAL ITCHING Patient is in today for vaginal itching that has been ongoing for about a month, since stopping the diflucan treatment that was started 12/04/2019. She reports the itching is "unbearable" and she is taking 2-3 showers per day to help relieve some of the symptoms.   She endorses increased vaginal discharge, increased thirst, increased urinary frequency, and dry/itchy skin.   She is unsure of her blood sugars for the past month. She has been unable to check them due to dead battery in her glucometer. She reports the replacement batteries are $20 a piece and she will need 2 of them and she cannot afford that at this time.  .  She reports that she has been taking metformin XR 779m in the morning and Tresiba 28 units at night. She has not adjusted the dose of Tresiba due to not being able to check her blood sugar.  She reports that she has not started [place] as she was unaware this medication had been prescribed and she has not received it from the pharmacy.   INCONTINENCE She reports that she is incontinent of urine and wears an incontinence pad during the day and incontinence briefs and a pad at night. She reports the cost is significant and is requesting a prescription for these to help with cost.   She does report increased urinary frequency, but denies burning, pressure, back pain, or chills. She states that she did wake up this morning sweating, but she does not know if she had a fever or not.   She is unable to provide a urine sample today.   FINANCIAL CONCERNS AFFECTING MEDICAL CARE She reports that she is on a very fixed income and his having difficulties obtaining her  medications and supplies for her diabetes and getting to her appointments. She [facility] but she does not have the money for gas to get there.  She states that the batteries for her glucometer are $20 a piece and not covered under her insurance plan. She must wait until she receives her check on the 3rd of this month before she can afford to get them.   She also reports that last month [place]71 for a box of needles, which her insurance did not cover.    Past Medical History:  Diagnosis Date  . Allergy    Notes that grass and some trees causes eyes to sting, burn, and water.  . Anemia   . Celiac disease   . Celiac disease   . COPD (chronic obstructive [place] disease) [place]   . Diabetes mellitus type II   . Diverticulitis   . Dyslipidemia   . HOH (hard of hearing)   . Hypertension   . Hypothyroidism   . Sleep apnea   . Sore throat     Past Surgical History:  Procedure Laterality Date  . APPENDECTOMY    . BREAST SURGERY     h/o benign cystleft breast and lymp node removal on right breast area.  .Marland KitchenEYE SURGERY  Left eye cataract removal, right eye h/o macular degeneration.  . INTRAVASCULAR PRESSURE WIRE/FFR STUDY N/A 12/07/2017   Procedure: INTRAVASCULAR PRESSURE WIRE/FFR STUDY;  Surgeon: Leonie Man, MD;  Location: [facility];  Service: Cardiovascular;  Laterality: N/A;  . LEFT HEART CATH AND CORONARY ANGIOGRAPHY N/A 12/07/2017   Procedure: LEFT HEART CATH AND CORONARY ANGIOGRAPHY;  Surgeon: Leonie Man, MD;  Location: [facility];  Service: Cardiovascular;  Laterality: N/A;  . SPINE SURGERY     Pt not sure of the type of surgery she had but knows it was L4-5.  Marland Kitchen VAGINAL HYSTERECTOMY      Family History  Problem Relation Age of Onset  . Anxiety disorder Mother   . Depression Mother   . Heart attack Mother   .  Stroke Mother   . Diabetes type II Mother   . Diabetes Father   . Glaucoma Father   . Hypertension Father   . COPD Father   . COPD Sister   . Hashimoto's thyroiditis Sister   . Cancer Sister   . Kidney disease Sister   . Drug abuse Brother   . Alcohol abuse Brother   . Asthma Son   . Diabetes Son     Social History   Socioeconomic History  . Marital status: Divorced    Spouse name: Not on file  . Number of children: Not on file  . Years of education: Not on file  . Highest education level: Not on file  Occupational History  . Not on file  Tobacco Use  . Smoking status: Current Some Day Smoker    Packs/day: 0.25    Years: 51.00    Pack years: 12.75    Types: Cigarettes    Start date: 01/02/2012  . Smokeless tobacco: Never Used  . Tobacco comment: Been down to 4 - 6 cigs a day  Vaping Use  . Vaping Use: Never used  Substance and Sexual Activity  . Alcohol use: No  . Drug use: No  . Sexual activity: Not Currently  Other Topics Concern  . Not on file  Social History Narrative  . Not on file   Social Determinants of Health   Financial Resource Strain:   . Difficulty of Paying Living Expenses: Not on file  Food Insecurity:   . Worried About Charity fundraiser in the Last Year: Not on file  . Ran Out of Food in the Last Year: Not on file  Transportation Needs:   . Lack of Transportation (Medical): Not on file  . Lack of Transportation (Non-Medical): Not on file  Physical Activity:   . Days of Exercise per Week: Not on file  . Minutes of Exercise per Session: Not on file  Stress:   . Feeling of Stress : Not on file  Social Connections:   . Frequency of Communication with Friends and Family: Not on file  . Frequency of Social Gatherings with Friends and Family: Not on file  . Attends Religious Services: Not on file  . Active Member of Clubs or Organizations: Not on file  . Attends Archivist Meetings: Not on file  . Marital Status: Not on file   Intimate Partner Violence:   . Fear of Current or Ex-Partner: Not on file  . Emotionally Abused: Not on file  . Physically Abused: Not on file  . Sexually Abused: Not on file    Outpatient Medications Prior to Visit  Medication Sig Dispense Refill  .  albuterol (PROVENTIL HFA;VENTOLIN HFA) 108 (90 Base) MCG/ACT inhaler Inhale 2 puffs into the lungs every 4 (four) hours as needed for wheezing. 2 Inhaler 11  . aspirin EC 81 MG tablet Take 1 tablet (81 mg total) by mouth daily. 90 tablet 3  . cholecalciferol (VITAMIN D) 1000 units tablet Take 1,000 Units by mouth daily.    . colchicine 0.6 MG tablet Take 1 tablet (0.6 mg total) by mouth daily. 90 tablet 3  . dicyclomine (BENTYL) 10 MG capsule Take 1 capsule (10 mg total) by mouth 2 (two) times daily. 180 capsule 3  . EPINEPHrine (EPIPEN 2-PAK) 0.3 mg/0.3 mL IJ SOAJ injection Inject 0.3 mLs (0.3 mg total) into the muscle as needed for anaphylaxis. 1 Device prn  . famotidine (PEPCID) 20 MG tablet TAKE 1 TABLET(20 MG TOTAL) BY MOUTH DAILY. 90 tablet 1  . Fluticasone-Salmeterol (ADVAIR DISKUS) 250-50 MCG/DOSE AEPB Inhale 1 puff into the lungs 2 (two) times daily. 60 each 12  . gabapentin (NEURONTIN) 300 MG capsule Take 625m TID.  May increase to 9038mTID if needed. 180 capsule 3  . ibuprofen (ADVIL) 800 MG tablet TAKE 1 TABLET(800 MG) BY MOUTH EVERY 8 HOURS AS NEEDED FOR PAIN 30 tablet 0  . isosorbide mononitrate (IMDUR) 30 MG 24 hr tablet Take 1 tablet (30 mg total) by mouth daily. Need office visit w/ cardiology for more refills 90 tablet 3  . levothyroxine (SYNTHROID) 175 MCG tablet Take 1 tablet (175 mcg total) by mouth daily before breakfast. LAB RECHECK AROUND 01/27/20 90 tablet 0  . LUMIGAN 0.01 % SOLN Instill 1 drop into both eyes every night  3  . meclizine (ANTIVERT) 25 MG tablet Take 1 tablet (25 mg total) by mouth 3 (three) times daily. 90 tablet 3  . meloxicam (MOBIC) 15 MG tablet Take 1 tablet (15 mg total) by mouth daily. Take with  food 90 tablet 0  . Multiple Vitamins-Minerals (PRESERVISION AREDS 2 PO) Take 1 capsule by mouth 2 (two) times daily.    . nitroGLYCERIN (NITROSTAT) 0.4 MG SL tablet Place 1 tablet (0.4 mg total) under the tongue every 5 (five) minutes as needed for chest pain. 25 tablet 11  . QUEtiapine (SEROQUEL) 200 MG tablet Take 1 tablet (200 mg total) by mouth at bedtime. 90 tablet 0  . rosuvastatin (CRESTOR) 40 MG tablet TAKE 1 TABLET(40 MG) BY MOUTH DAILY 90 tablet 3  . sertraline (ZOLOFT) 100 MG tablet Take 2 tablets (200 mg total) by mouth daily. 180 tablet 0  . traMADol (ULTRAM) 50 MG tablet Take 1-2 tablets (50-100 mg total) by mouth every 8 (eight) hours as needed for moderate pain. Maximum 6 tabs per day. 21 tablet 0  . AMBULATORY NON FORMULARY MEDICATION Diapers Dx Stress urinary incontinence - size and brand per patient preference and insurance coverage, QS 90 days w/ refill x99 100 Units 99  . Insulin Degludec (TRESIBA FLEXTOUCH) 200 UNIT/ML SOPN Inject 28-60 Units into the skin at bedtime. 15 mL 99  . Insulin Pen Needle (BD PEN NEEDLE NANO 2ND GEN) 32G X 4 MM MISC 1 Device by Does not apply route as needed (with insulin). 100 each 2  . metFORMIN (GLUCOPHAGE-XR) 750 MG 24 hr tablet Take one tablet by mouth daily with breakfast. 90 tablet 1  . amitriptyline (ELAVIL) 50 MG tablet     . acetaminophen (TYLENOL) 500 MG tablet Take 1,000 mg by mouth every 6 (six) hours as needed for mild pain or moderate pain. (Patient not taking:  Reported on 01/27/2020)    . ciprofloxacin (CIPRO) 500 MG tablet Take 1 tablet (500 mg total) by mouth 2 (two) times daily. (Patient not taking: Reported on 01/27/2020) 20 tablet 0  . diclofenac sodium (VOLTAREN) 1 % GEL Apply 4 g topically 4 (four) times daily. To affected joint. (Patient not taking: Reported on 01/27/2020) 100 g 11  . empagliflozin (JARDIANCE) 10 MG TABS tablet Take 10 mg by mouth daily. (Patient not taking: Reported on 01/27/2020) 90 tablet 1  . fluconazole  (DIFLUCAN) 150 MG tablet Once today, repeat in 3 days, then repeat once per week for 4 weeks (Patient not taking: Reported on 01/27/2020) 6 tablet 0  . GLUCOSAMINE-CHONDROITIN PO Take 1 tablet by mouth 2 (two) times daily.  (Patient not taking: Reported on 01/27/2020)    . predniSONE (STERAPRED UNI-PAK 48 TAB) 10 MG (48) TBPK tablet Take by mouth daily. 12-Day taper, po (Patient not taking: Reported on 01/27/2020) 48 tablet 0   No facility-administered medications prior to visit.    Allergies  Allergen Reactions  . Bee Venom Anaphylaxis  . Iodinated Diagnostic Agents Other (See Comments)    Per patient cardiac arrest IVP DYE-CARDIAC ARREST  . Penicillins Anaphylaxis    Has patient had a PCN reaction causing immediate rash, facial/tongue/throat swelling, SOB or lightheadedness with hypotension: yes Has patient had a PCN reaction causing severe rash involving mucus membranes or skin necrosis: no Has patient had a PCN reaction that required hospitalization: yes Has patient had a PCN reaction occurring within the last 10 years: no If all of the above answers are "NO", then may proceed with Cephalosporin use.   . Povidone Iodine Itching and Rash  . Tomato Itching  . Chocolate Rash  . Latex Rash  . Other Rash  . Quetiapine Fumarate Other (See Comments)    Per pt she can not take XR   . Trazodone And Nefazodone Hives  . Zolpidem Tartrate Nausea And Vomiting  . Gluten Meal   . Zolpidem Tartrate Nausea And Vomiting  . Atorvastatin Calcium Nausea Only  . Esomeprazole Magnesium Nausea Only  . Metformin And Related Diarrhea      Objective:    Physical Exam Vitals and nursing note reviewed.  Constitutional:      Appearance: Normal appearance. She is obese.  HENT:     Head: Normocephalic.  Eyes:     Extraocular Movements: Extraocular movements intact.     Conjunctiva/sclera: Conjunctivae normal.     Pupils: Pupils are equal, round, and reactive to light.  Cardiovascular:     Rate and  Rhythm: Normal rate and regular rhythm.     Pulses: Normal pulses.     Heart sounds: Normal heart sounds.  Pulmonary:     Effort: Pulmonary effort is normal.     Breath sounds: Normal breath sounds.  Abdominal:     General: Abdomen is flat. Bowel sounds are normal.     Palpations: Abdomen is soft.     Tenderness: There is no abdominal tenderness. There is no right CVA tenderness, left CVA tenderness or guarding.  Genitourinary:    Comments: Vaginal exam deferred by patient today.  Musculoskeletal:        General: Normal range of motion.     Cervical back: Normal range of motion and neck supple.  Skin:    General: Skin is warm and dry.     Capillary Refill: Capillary refill takes less than 2 seconds.  Neurological:     General: No focal deficit  present.     Mental Status: She is oriented to person, place, and time.     Motor: Weakness present.  Psychiatric:        Mood and Affect: Mood normal.        Behavior: Behavior normal.        Thought Content: Thought content normal.        Judgment: Judgment normal.     BP 96/64   Pulse 92   Temp 98.2 F (36.8 C) (Oral)   Ht 5' 3"  (1.6 m)   Wt 194 lb 11.2 oz (88.3 kg)   LMP 09/21/1974   SpO2 95%   BMI 34.49 kg/m  Wt Readings from Last 3 Encounters:  01/27/20 194 lb 11.2 oz (88.3 kg)  01/01/20 195 lb (88.5 kg)  12/04/19 200 lb (90.7 kg)    Health Maintenance Due  Topic Date Due  . COVID-19 Vaccine (1) Never done  . INFLUENZA VACCINE  12/29/2019    There are no preventive care reminders to display for this patient.   Lab Results  Component Value Date   TSH 1.16 01/01/2020   Lab Results  Component Value Date   WBC 9.2 01/01/2020   HGB 14.0 01/01/2020   HCT 43.9 01/01/2020   MCV 92.4 01/01/2020   PLT 510 (H) 01/01/2020   Lab Results  Component Value Date   NA 141 01/01/2020   K 5.3 01/01/2020   CO2 33 (H) 01/01/2020   GLUCOSE 118 01/01/2020   BUN 16 01/01/2020   CREATININE 0.65 01/01/2020   BILITOT 0.5  01/01/2020   ALKPHOS 53 12/28/2016   AST 29 01/01/2020   ALT 39 (H) 01/01/2020   PROT 6.4 01/01/2020   ALBUMIN 3.9 12/28/2016   CALCIUM 10.4 01/01/2020   Lab Results  Component Value Date   CHOL 370 (H) 10/23/2019   Lab Results  Component Value Date   HDL 64 10/23/2019   Lab Results  Component Value Date   LDLCALC 249 (H) 10/23/2019   Lab Results  Component Value Date   TRIG 339 (H) 10/23/2019   Lab Results  Component Value Date   CHOLHDL 5.8 (H) 10/23/2019   Lab Results  Component Value Date   HGBA1C 7.5 (H) 01/01/2020       Assessment & Plan:   Problem List Items Addressed This Visit      Endocrine   Type 2 diabetes mellitus with complication, with long-term current use of insulin ([place]) - Primary    Poor blood glucose control due in part to financial restrictions preventing regular blood glucose monitoring and in part to education barrier and patient understanding of disease process.  Her blood glucose was 238 for a random check in the office today.  Discussed the importance of regular blood glucose monitoring to help prevent escalation of disease state and disease related complications, such as frequent candidal infections.  I feel that the patients .current financial status is restricting her ability to properly manage her disease state. I do feel she would benefit from case management to determine resources available for her.  Increase metformin 1500 mg every AM Increase Tresiba to 30 units every evening, increased on recommended scale based on blood sugar readings.  Monitor blood sugar at least once a day.  Refills provided for Metformin, insulin needles, alcohol swabs.  Recommend frequent follow-up with PCP to follow blood glucose levels.       Relevant Medications   Insulin Pen Needle (BD PEN NEEDLE NANO 2ND GEN) 32G  X 4 MM MISC   Alcohol Swabs 70 % PADS   metFORMIN (GLUCOPHAGE-XR) 750 MG 24 hr tablet   insulin degludec (TRESIBA FLEXTOUCH) 200 UNIT/ML  FlexTouch Pen   Other Relevant Orders   POCT glucose (manual entry) (Completed)   Ambulatory referral to Social Work     Genitourinary   Vulvovaginal candidiasis    Presentation and symptoms consistent with persistent vulvovaginal candidiasis related to elevated blood glucose levels. Her current issues with incontinence and utilization of incontinence pads/breifs is likely playing a large role in the exacerbation of the growth of candida.  Strongly suspect the cost associated with frequent changes of undergarments may be playing a factor, as well.  Her symptoms were well controlled on month long diflucan treatment. Discussed with PCP and joint decision was made to begin ongoing treatment, while continuing to work on improved management of blood glucose levels.  Recommend follow-up with PCP for evaluation of continued need of weekly dosing as blood glucose levels are better controlled.       Relevant Medications   fluconazole (DIFLUCAN) 150 MG tablet     Other   Mixed stress and urge urinary incontinence    Presentation and symptoms consistent with mixed type incontinence in the setting of uncontrolled blood glucose levels. It is likely her blood glucose is playing a significant role in the incontinent episodes the patient is experiencing.  Use of incontinence briefs and pads are helpful to the patient, but do cause concern for increased risk of vulvovaginal candidiasis.  Encouraged frequent hygiene and change of incontinence pads/briefs, avoiding increased liquids prior to bedtime, and utilizing double voiding to help fully empty the bladder.  Prescription printed and provided to patient for incontinence pads and incontinence briefs.      Relevant Medications   AMBULATORY NON FORMULARY MEDICATION   AMBULATORY NON FORMULARY MEDICATION   Financial difficulties    Patient expressing financial difficulties affecting her ability to properly manage her diabetes and other health conditions.   I do feel that she could benefit from a social work consult to help with possible resources available for patient.  Consult placed.       Relevant Orders   Ambulatory referral to Social Work       Meds ordered this encounter  Medications  . AMBULATORY NON FORMULARY MEDICATION    Sig: Adult Diaper Dx Stress urinary incontinence - size and brand per patient preference and insurance coverage, QS 90 days w/ refill x99    Dispense:  100 Units    Refill:  99  . AMBULATORY NON FORMULARY MEDICATION    Sig: Adult incontinence pads- size and brand per patient preference and insurance coverage.    Dispense:  100 Units    Refill:  99  . Insulin Pen Needle (BD PEN NEEDLE NANO 2ND GEN) 32G X 4 MM MISC    Sig: 1 Device by Does not apply route as needed (with insulin).    Dispense:  100 each    Refill:  5  . Alcohol Swabs 70 % PADS    Sig: 1 Units by Does not apply route 3 (three) times daily.    Dispense:  120 each    Refill:  5    Brand and amount as covered by patients insurance.  . metFORMIN (GLUCOPHAGE-XR) 750 MG 24 hr tablet    Sig: Take two tablets by mouth daily with breakfast.    Dispense:  180 tablet    Refill:  1  . fluconazole (DIFLUCAN)  150 MG tablet    Sig: Take ONE tablet (150 mg) today. Take ONE tablet (150 mg) on Thursday. THEN take ONE tablet (161m) EVERY Monday from now on.    Dispense:  32 tablet    Refill:  3  . insulin degludec (TRESIBA FLEXTOUCH) 200 UNIT/ML FlexTouch Pen    Sig: Inject 30-60 Units into the skin at bedtime.    Dispense:  15 mL    Refill:  99    Greater than 40 minutes spent in visit with patient with greater than 50% of the time spent counseling on current disease state and progression.   Follow-up with PCP in 6 weeks for DM and Vulvovaginal candidiasis.   SOrma Render NP

## 2020-01-27 NOTE — Patient Instructions (Addendum)
I want you to increase your Metformin to 2 tablets every morning.   I would like you to increase your Tresiba to 30 units every night at bedtime.  I am ordering Diflucan for you to take for the yeast infection. I want you to take ONE pill today, ONE pill on Thursday, and then ONE pill EVERY Monday from now on.    I have written a prescription for incontinence pads and briefs.  I sent refills to the pharmacy on your Metformin, Needles, and ordered Alcohol Pads for you.    I am going to put in a consult for a social worker to see if we can find some resources in the community to help with your supplies and appointments. They will contact you by telephone.    Vaginal Yeast Infection, Adult  Vaginal yeast infection is a condition that causes vaginal discharge as well as soreness, swelling, and redness (inflammation) of the vagina. This is a common condition. Some women get this infection frequently. What are the causes? This condition is caused by a change in the normal balance of the yeast (candida) and bacteria that live in the vagina. This change causes an overgrowth of yeast, which causes the inflammation. What increases the risk? The condition is more likely to develop in women who:  Take antibiotic medicines.  Have diabetes.  Take birth control pills.  Are pregnant.  Douche often.  Have a weak body defense system (immune system).  Have been taking steroid medicines for a long time.  Frequently wear tight clothing. What are the signs or symptoms? Symptoms of this condition include:  White, thick, creamy vaginal discharge.  Swelling, itching, redness, and irritation of the vagina. The lips of the vagina (vulva) may be affected as well.  Pain or a burning feeling while urinating.  Pain during sex. How is this diagnosed? This condition is diagnosed based on:  Your medical history.  A physical exam.  A pelvic exam. Your health care provider will examine a sample of  your vaginal discharge under a microscope. Your health care provider may send this sample for testing to confirm the diagnosis. How is this treated? This condition is treated with medicine. Medicines may be over-the-counter or prescription. You may be told to use one or more of the following:  Medicine that is taken by mouth (orally).  Medicine that is applied as a cream (topically).  Medicine that is inserted directly into the vagina (suppository). Follow these instructions at home:  Lifestyle  Do not have sex until your health care provider approves. Tell your sex partner that you have a yeast infection. That person should go to his or her health care provider and ask if they should also be treated.  Do not wear tight clothes, such as pantyhose or tight pants.  Wear breathable cotton underwear. General instructions  Take or apply over-the-counter and prescription medicines only as told by your health care provider.  Eat more yogurt. This may help to keep your yeast infection from returning.  Do not use tampons until your health care provider approves.  Try taking a sitz bath to help with discomfort. This is a warm water bath that is taken while you are sitting down. The water should only come up to your hips and should cover your buttocks. Do this 3-4 times per day or as told by your health care provider.  Do not douche.  If you have diabetes, keep your blood sugar levels under control.  Keep all follow-up  visits as told by your health care provider. This is important. Contact a health care provider if:  You have a fever.  Your symptoms go away and then return.  Your symptoms do not get better with treatment.  Your symptoms get worse.  You have new symptoms.  You develop blisters in or around your vagina.  You have blood coming from your vagina and it is not your menstrual period.  You develop pain in your abdomen. Summary  Vaginal yeast infection is a condition  that causes discharge as well as soreness, swelling, and redness (inflammation) of the vagina.  This condition is treated with medicine. Medicines may be over-the-counter or prescription.  Take or apply over-the-counter and prescription medicines only as told by your health care provider.  Do not douche. Do not have sex or use tampons until your health care provider approves.  Contact a health care provider if your symptoms do not get better with treatment or your symptoms go away and then return. This information is not intended to replace advice given to you by your health care provider. Make sure you discuss any questions you have with your health care provider. Document Revised: 12/14/2018 Document Reviewed: 10/02/2017 Elsevier Patient Education  [place].

## 2020-01-27 NOTE — Assessment & Plan Note (Addendum)
Presentation and symptoms consistent with mixed type incontinence in the setting of uncontrolled blood glucose levels. It is likely her blood glucose is playing a significant role in the incontinent episodes the patient is experiencing.  Use of incontinence briefs and pads are helpful to the patient, but do cause concern for increased risk of vulvovaginal candidiasis.  Encouraged frequent hygiene and change of incontinence pads/briefs, avoiding increased liquids prior to bedtime, and utilizing double voiding to help fully empty the bladder.  Prescription printed and provided to patient for incontinence pads and incontinence briefs.

## 2020-01-27 NOTE — Assessment & Plan Note (Signed)
Poor blood glucose control due in part to financial restrictions preventing regular blood glucose monitoring and in part to education barrier and patient understanding of disease process.  Her blood glucose was 238 for a random check in the office today.  Discussed the importance of regular blood glucose monitoring to help prevent escalation of disease state and disease related complications, such as frequent candidal infections.  I feel that the patients .current financial status is restricting her ability to properly manage her disease state. I do feel she would benefit from case management to determine resources available for her.  Increase metformin 1500 mg every AM Increase Tresiba to 30 units every evening, increased on recommended scale based on blood sugar readings.  Monitor blood sugar at least once a day.  Refills provided for Metformin, insulin needles, alcohol swabs.  Recommend frequent follow-up with PCP to follow blood glucose levels.

## 2020-01-27 NOTE — Assessment & Plan Note (Signed)
Patient expressing financial difficulties affecting her ability to properly manage her diabetes and other health conditions.  I do feel that she could benefit from a social work consult to help with possible resources available for patient.  Consult placed.

## 2020-01-27 NOTE — Assessment & Plan Note (Addendum)
Presentation and symptoms consistent with persistent vulvovaginal candidiasis related to elevated blood glucose levels. Her current issues with incontinence and utilization of incontinence pads/breifs is likely playing a large role in the exacerbation of the growth of candida.  Strongly suspect the cost associated with frequent changes of undergarments may be playing a factor, as well.  Her symptoms were well controlled on month long diflucan treatment. Discussed with PCP and joint decision was made to begin ongoing treatment, while continuing to work on improved management of blood glucose levels.  Recommend follow-up with PCP for evaluation of continued need of weekly dosing as blood glucose levels are better controlled.

## 2020-01-27 NOTE — Telephone Encounter (Signed)
For this patient's Social work referral, I reached out to [employer] with Social Work at [employer] at [phone] and he informed me that since this patient has Medicare and Medicaid , she should only have $4 co-pays and that there is nothing they can do with her financial situation since she already gets a check each month.

## 2020-01-29 NOTE — Telephone Encounter (Signed)
Thank you for letting me know

## 2020-01-31 NOTE — Telephone Encounter (Signed)
This encounter was created in error - please disregard.

## 2020-02-10 ENCOUNTER — Ambulatory Visit (INDEPENDENT_AMBULATORY_CARE_PROVIDER_SITE_OTHER): Payer: Medicare HMO | Admitting: Nurse Practitioner

## 2020-02-10 ENCOUNTER — Encounter: Payer: Self-pay | Admitting: Nurse Practitioner

## 2020-02-10 VITALS — BP 90/76 | HR 91 | Temp 98.5°F | Ht 63.0 in | Wt 200.3 lb

## 2020-02-10 DIAGNOSIS — I872 Venous insufficiency (chronic) (peripheral): Secondary | ICD-10-CM

## 2020-02-10 DIAGNOSIS — Z23 Encounter for immunization: Secondary | ICD-10-CM | POA: Diagnosis not present

## 2020-02-10 DIAGNOSIS — I998 Other disorder of circulatory system: Secondary | ICD-10-CM | POA: Diagnosis not present

## 2020-02-10 NOTE — Patient Instructions (Addendum)
I recommend that you start wearing your compression stockings during the day while you are awake and then take them off at bedtime while you sleep. This should help keep the swelling down in your legs during the day.   Please talk with Dr. Stanford Breed about your blood pressure medication. I would like you to write down the readings you are getting at home in the morning and then take your blood pressure in the afternoon after you have taken your medications and write these down too. Bring these in to your appointment with Dr. Stanford Breed so he can review these with you.   Chronic Venous Insufficiency Chronic venous insufficiency is a condition where the leg veins cannot effectively pump blood from the legs to the heart. This happens when the vein walls are either stretched, weakened, or damaged, or when the valves inside the vein are damaged. With the right treatment, you should be able to continue with an active life. This condition is also called venous stasis. What are the causes? Common causes of this condition include:  High blood pressure inside the veins (venous hypertension).  Sitting or standing too long, causing increased blood pressure in the leg veins.  A blood clot that blocks blood flow in a vein (deep vein thrombosis, DVT).  Inflammation of a vein (phlebitis) that causes a blood clot to form.  Tumors in the pelvis that cause blood to back up. What increases the risk? The following factors may make you more likely to develop this condition:  Having a family history of this condition.  Obesity.  Pregnancy.  Living without enough regular physical activity or exercise (sedentary lifestyle).  Smoking.  Having a job that requires long periods of standing or sitting in one place.  Being a certain age. Women in their 35s and 18s and men in their 65s are more likely to develop this condition. What are the signs or symptoms? Symptoms of this condition include:  Veins that are  enlarged, bulging, or twisted (varicose veins).  Skin breakdown or ulcers.  Reddened skin or dark discoloration of skin on the leg between the knee and ankle.  Brown, smooth, tight, and painful skin just above the ankle, usually on the inside of the leg (lipodermatosclerosis).  Swelling of the legs. How is this diagnosed? This condition may be diagnosed based on:  Your medical history.  A physical exam.  Tests, such as: ? A procedure that creates an image of a blood vessel and nearby organs and provides information about blood flow through the blood vessel (duplex ultrasound). ? A procedure that tests blood flow (plethysmography). ? A procedure that looks at the veins using X-ray and dye (venogram). How is this treated? The goals of treatment are to help you return to an active life and to minimize pain or disability. Treatment depends on the severity of your condition, and it may include:  Wearing compression stockings. These can help relieve symptoms and help prevent your condition from getting worse. However, they do not cure the condition.  Sclerotherapy. This procedure involves an injection of a solution that shrinks damaged veins.  Surgery. This may involve: ? Removing a diseased vein (vein stripping). ? Cutting off blood flow through the vein (laser ablation surgery). ? Repairing or reconstructing a valve within the affected vein. Follow these instructions at home:      Wear compression stockings as told by your health care provider. These stockings help to prevent blood clots and reduce swelling in your legs.  Take  over-the-counter and prescription medicines only as told by your health care provider.  Stay active by exercising, walking, or doing different activities. Ask your health care provider what activities are safe for you and how much exercise you need.  Drink enough fluid to keep your urine pale yellow.  Do not use any products that contain nicotine or  tobacco, such as cigarettes, e-cigarettes, and chewing tobacco. If you need help quitting, ask your health care provider.  Keep all follow-up visits as told by your health care provider. This is important. Contact a health care provider if you:  Have redness, swelling, or more pain in the affected area.  See a red streak or line that goes up or down from the affected area.  Have skin breakdown or skin loss in the affected area, even if the breakdown is small.  Get an injury in the affected area. Get help right away if:  You get an injury and an open wound in the affected area.  You have: ? Severe pain that does not get better with medicine. ? Sudden numbness or weakness in the foot or ankle below the affected area. ? Trouble moving your foot or ankle. ? A fever. ? Worse or persistent symptoms. ? Chest pain. ? Shortness of breath. Summary  Chronic venous insufficiency is a condition where the leg veins cannot effectively pump blood from the legs to the heart.  Chronic venous insufficiency occurs when the vein walls become stretched, weakened, or damaged, or when valves within the vein are damaged.  Treatment depends on how severe your condition is. It often involves wearing compression stockings and may involve having a procedure.  Make sure you stay active by exercising, walking, or doing different activities. Ask your health care provider what activities are safe for you and how much exercise you need. This information is not intended to replace advice given to you by your health care provider. Make sure you discuss any questions you have with your health care provider. Document Revised: 02/06/2018 Document Reviewed: 02/06/2018 Elsevier Patient Education  [place].

## 2020-02-10 NOTE — Assessment & Plan Note (Signed)
It is unclear at this time if her blood pressure monitor at home is off on calibration or if she is having significant variations in her blood pressure after taking her medication.  Discussed bringing her blood pressure cuff into the office so we can check it with our equipment.  Discussed monitoring blood pressure first thing in the morning, as she has been, and also monitoring in the [place] afternoon after she has taken her medication to see if this is a typical drop in blood pressure or an incidental finding today.  I recommend that she take her blood pressure readings to the cardiologist to discuss these with him to determine if medication changes need to be made.  She has had several falls recently and her blood pressure could be the culprit for the symptoms of dizziness and falls that she has had.  Follow-up sooner if her blood pressure continues to run low in the afternoon.

## 2020-02-10 NOTE — Progress Notes (Signed)
Acute Office Visit  Subjective:    Patient ID: Autumn Bowen, female    DOB: [birth year], 74 y.o.   MRN: [id number]  Chief Complaint  Patient presents with  . Leg Swelling    left leg, worsening as the day progresses, decreases when legs are elevated    HPI Patient is in today for swelling in her lower extremities with the left>right that started about 2 weeks ago. She reports the swelling worsens throughout the day and improves overnight when she has been laying flat. She reports that she is up on her feet throughout the day and does not prop her feet up when she is seated. She reports that at the end of the day the bottom of her feet are aching when she walks on them due to the swelling.   She denies any redness, warmth, or calf pain in either leg.   She also reports that her blood pressure has been elevated when she awakens (prior to getting out of bed in the morning). She states that this morning it was 181/86. In the office this afternoon her pressure is 90/76. Her BP was taken in the office three times on both extremities.   She denies headaches, current dizziness or instability, or palpitations.   Past Medical History:  Diagnosis Date  . Allergy    Notes that grass and some trees causes eyes to sting, burn, and water.  . Anemia   . Celiac disease   . Celiac disease   . COPD (chronic obstructive pulmonary disease) (HCC)   . Diabetes mellitus type II   . Diverticulitis   . Dyslipidemia   . HOH (hard of hearing)   . Hypertension   . Hypothyroidism   . Sleep apnea   . Sore throat     Past Surgical History:  Procedure Laterality Date  . APPENDECTOMY    . BREAST SURGERY     h/o benign cystleft breast and lymp node removal on right breast area.  Marland Kitchen EYE SURGERY     Left eye cataract removal, right eye h/o macular degeneration.  . INTRAVASCULAR PRESSURE WIRE/FFR STUDY N/A 12/07/2017   Procedure: INTRAVASCULAR PRESSURE WIRE/FFR STUDY;  Surgeon: Marykay Lex, MD;   Location: [facility] INVASIVE CV LAB;  Service: Cardiovascular;  Laterality: N/A;  . LEFT HEART CATH AND CORONARY ANGIOGRAPHY N/A 12/07/2017   Procedure: LEFT HEART CATH AND CORONARY ANGIOGRAPHY;  Surgeon: Marykay Lex, MD;  Location: [facility] INVASIVE CV LAB;  Service: Cardiovascular;  Laterality: N/A;  . SPINE SURGERY     Pt not sure of the type of surgery she had but knows it was L4-5.  Marland Kitchen VAGINAL HYSTERECTOMY      Family History  Problem Relation Age of Onset  . Anxiety disorder Mother   . Depression Mother   . Heart attack Mother   . Stroke Mother   . Diabetes type II Mother   . Diabetes Father   . Glaucoma Father   . Hypertension Father   . COPD Father   . COPD Sister   . Hashimoto's thyroiditis Sister   . Cancer Sister   . Kidney disease Sister   . Drug abuse Brother   . Alcohol abuse Brother   . Asthma Son   . Diabetes Son     Social History   Socioeconomic History  . Marital status: Divorced    Spouse name: Not on file  . Number of children: Not on file  . Years of education: Not on file  .  Highest education level: Not on file  Occupational History  . Not on file  Tobacco Use  . Smoking status: Current Some Day Smoker    Packs/day: 0.25    Years: 51.00    Pack years: 12.75    Types: Cigarettes    Start date: 01/02/2012  . Smokeless tobacco: Never Used  . Tobacco comment: Been down to 4 - 6 cigs a day  Vaping Use  . Vaping Use: Never used  Substance and Sexual Activity  . Alcohol use: No  . Drug use: No  . Sexual activity: Not Currently  Other Topics Concern  . Not on file  Social History Narrative  . Not on file   Social Determinants of Health   Financial Resource Strain:   . Difficulty of Paying Living Expenses: Not on file  Food Insecurity:   . Worried About Programme researcher, broadcasting/film/video in the Last Year: Not on file  . Ran Out of Food in the Last Year: Not on file  Transportation Needs:   . Lack of Transportation (Medical): Not on file  . Lack of Transportation  (Non-Medical): Not on file  Physical Activity:   . Days of Exercise per Week: Not on file  . Minutes of Exercise per Session: Not on file  Stress:   . Feeling of Stress : Not on file  Social Connections:   . Frequency of Communication with Friends and Family: Not on file  . Frequency of Social Gatherings with Friends and Family: Not on file  . Attends Religious Services: Not on file  . Active Member of Clubs or Organizations: Not on file  . Attends Banker Meetings: Not on file  . Marital Status: Not on file  Intimate Partner Violence:   . Fear of Current or Ex-Partner: Not on file  . Emotionally Abused: Not on file  . Physically Abused: Not on file  . Sexually Abused: Not on file    Outpatient Medications Prior to Visit  Medication Sig Dispense Refill  . albuterol (PROVENTIL HFA;VENTOLIN HFA) 108 (90 Base) MCG/ACT inhaler Inhale 2 puffs into the lungs every 4 (four) hours as needed for wheezing. 2 Inhaler 11  . Alcohol Swabs 70 % PADS 1 Units by Does not apply route 3 (three) times daily. 120 each 5  . AMBULATORY NON FORMULARY MEDICATION Adult Diaper Dx Stress urinary incontinence - size and brand per patient preference and insurance coverage, QS 90 days w/ refill x99 100 Units 99  . AMBULATORY NON FORMULARY MEDICATION Adult incontinence pads- size and brand per patient preference and insurance coverage. 100 Units 99  . amitriptyline (ELAVIL) 50 MG tablet     . aspirin EC 81 MG tablet Take 1 tablet (81 mg total) by mouth daily. 90 tablet 3  . cholecalciferol (VITAMIN D) 1000 units tablet Take 1,000 Units by mouth daily.    Marland Kitchen dicyclomine (BENTYL) 10 MG capsule Take 1 capsule (10 mg total) by mouth 2 (two) times daily. 180 capsule 3  . EPINEPHrine (EPIPEN 2-PAK) 0.3 mg/0.3 mL IJ SOAJ injection Inject 0.3 mLs (0.3 mg total) into the muscle as needed for anaphylaxis. 1 Device prn  . famotidine (PEPCID) 20 MG tablet TAKE 1 TABLET(20 MG TOTAL) BY MOUTH DAILY. 90 tablet 1  .  Fluticasone-Salmeterol (ADVAIR DISKUS) 250-50 MCG/DOSE AEPB Inhale 1 puff into the lungs 2 (two) times daily. 60 each 12  . gabapentin (NEURONTIN) 300 MG capsule Take 600mg  TID.  May increase to 900mg  TID if needed. 180  capsule 3  . ibuprofen (ADVIL) 800 MG tablet TAKE 1 TABLET(800 MG) BY MOUTH EVERY 8 HOURS AS NEEDED FOR PAIN 30 tablet 0  . insulin degludec (TRESIBA FLEXTOUCH) 200 UNIT/ML FlexTouch Pen Inject 30-60 Units into the skin at bedtime. 15 mL 99  . Insulin Pen Needle (BD PEN NEEDLE NANO 2ND GEN) 32G X 4 MM MISC 1 Device by Does not apply route as needed (with insulin). 100 each 5  . isosorbide mononitrate (IMDUR) 30 MG 24 hr tablet Take 1 tablet (30 mg total) by mouth daily. Need office visit w/ cardiology for more refills 90 tablet 3  . levothyroxine (SYNTHROID) 175 MCG tablet Take 1 tablet (175 mcg total) by mouth daily before breakfast. LAB RECHECK AROUND 01/27/20 90 tablet 0  . LUMIGAN 0.01 % SOLN Instill 1 drop into both eyes every night  3  . meloxicam (MOBIC) 15 MG tablet Take 1 tablet (15 mg total) by mouth daily. Take with food 90 tablet 0  . metFORMIN (GLUCOPHAGE-XR) 750 MG 24 hr tablet Take two tablets by mouth daily with breakfast. 180 tablet 1  . Multiple Vitamins-Minerals (PRESERVISION AREDS 2 PO) Take 1 capsule by mouth 2 (two) times daily.    . nitroGLYCERIN (NITROSTAT) 0.4 MG SL tablet Place 1 tablet (0.4 mg total) under the tongue every 5 (five) minutes as needed for chest pain. 25 tablet 11  . QUEtiapine (SEROQUEL) 200 MG tablet Take 1 tablet (200 mg total) by mouth at bedtime. 90 tablet 0  . rosuvastatin (CRESTOR) 40 MG tablet TAKE 1 TABLET(40 MG) BY MOUTH DAILY 90 tablet 3  . sertraline (ZOLOFT) 100 MG tablet Take 2 tablets (200 mg total) by mouth daily. 180 tablet 0  . traMADol (ULTRAM) 50 MG tablet Take 1-2 tablets (50-100 mg total) by mouth every 8 (eight) hours as needed for moderate pain. Maximum 6 tabs per day. 21 tablet 0  . colchicine 0.6 MG tablet Take 1  tablet (0.6 mg total) by mouth daily. (Patient not taking: Reported on 02/10/2020) 90 tablet 3  . fluconazole (DIFLUCAN) 150 MG tablet Take ONE tablet (150 mg) today. Take ONE tablet (150 mg) on Thursday. THEN take ONE tablet (150mg ) EVERY Monday from now on. (Patient not taking: Reported on 02/10/2020) 32 tablet 3  . meclizine (ANTIVERT) 25 MG tablet Take 1 tablet (25 mg total) by mouth 3 (three) times daily. (Patient not taking: Reported on 02/10/2020) 90 tablet 3   No facility-administered medications prior to visit.    Allergies  Allergen Reactions  . Bee Venom Anaphylaxis  . Iodinated Diagnostic Agents Other (See Comments)    Per patient cardiac arrest IVP DYE-CARDIAC ARREST  . Penicillins Anaphylaxis    Has patient had a PCN reaction causing immediate rash, facial/tongue/throat swelling, SOB or lightheadedness with hypotension: yes Has patient had a PCN reaction causing severe rash involving mucus membranes or skin necrosis: no Has patient had a PCN reaction that required hospitalization: yes Has patient had a PCN reaction occurring within the last 10 years: no If all of the above answers are "NO", then may proceed with Cephalosporin use.   . Povidone Iodine Itching and Rash  . Tomato Itching  . Chocolate Rash  . Latex Rash  . Other Rash  . Quetiapine Fumarate Other (See Comments)    Per pt she can not take XR   . Trazodone And Nefazodone Hives  . Zolpidem Tartrate Nausea And Vomiting  . Gluten Meal   . Zolpidem Tartrate Nausea And Vomiting  .  Atorvastatin Calcium Nausea Only  . Esomeprazole Magnesium Nausea Only  . Metformin And Related Diarrhea      Objective:    Physical Exam Vitals and nursing note reviewed.  Constitutional:      Appearance: Normal appearance. She is obese.  HENT:     Head: Normocephalic.  Eyes:     Extraocular Movements: Extraocular movements intact.     Conjunctiva/sclera: Conjunctivae normal.     Pupils: Pupils are equal, round, and reactive  to light.  Neck:     Vascular: No carotid bruit.  Cardiovascular:     Rate and Rhythm: Normal rate and regular rhythm.     Pulses: Normal pulses.     Heart sounds: Normal heart sounds.  Pulmonary:     Effort: Pulmonary effort is normal.     Breath sounds: Normal breath sounds.  Abdominal:     General: Abdomen is flat. Bowel sounds are normal.     Palpations: Abdomen is soft.     Tenderness: There is no abdominal tenderness.  Musculoskeletal:        General: Normal range of motion.     Cervical back: Normal range of motion and neck supple.     Right lower leg: No edema.     Left lower leg: Edema present.  Skin:    General: Skin is warm and dry.     Capillary Refill: Capillary refill takes less than 2 seconds.     Findings: No erythema.  Neurological:     General: No focal deficit present.     Mental Status: She is alert and oriented to person, place, and time.     Sensory: No sensory deficit.     Motor: No weakness.  Psychiatric:        Mood and Affect: Mood normal.        Behavior: Behavior normal.        Thought Content: Thought content normal.        Judgment: Judgment normal.     BP 90/76   Pulse 91   Temp 98.5 F (36.9 C) (Oral)   Ht 5\' 3"  (1.6 m)   Wt 200 lb 4.8 oz (90.9 kg)   LMP 09/21/1974   SpO2 92%   BMI 35.48 kg/m  Wt Readings from Last 3 Encounters:  02/10/20 200 lb 4.8 oz (90.9 kg)  01/27/20 194 lb 11.2 oz (88.3 kg)  01/01/20 195 lb (88.5 kg)    There are no preventive care reminders to display for this patient.  There are no preventive care reminders to display for this patient.   Lab Results  Component Value Date   TSH 1.16 01/01/2020   Lab Results  Component Value Date   WBC 9.2 01/01/2020   HGB 14.0 01/01/2020   HCT 43.9 01/01/2020   MCV 92.4 01/01/2020   PLT 510 (H) 01/01/2020   Lab Results  Component Value Date   NA 141 01/01/2020   K 5.3 01/01/2020   CO2 33 (H) 01/01/2020   GLUCOSE 118 01/01/2020   BUN 16 01/01/2020    CREATININE 0.65 01/01/2020   BILITOT 0.5 01/01/2020   ALKPHOS 53 12/28/2016   AST 29 01/01/2020   ALT 39 (H) 01/01/2020   PROT 6.4 01/01/2020   ALBUMIN 3.9 12/28/2016   CALCIUM 10.4 01/01/2020   Lab Results  Component Value Date   CHOL 370 (H) 10/23/2019   Lab Results  Component Value Date   HDL 64 10/23/2019   Lab Results  Component Value  Date   LDLCALC 249 (H) 10/23/2019   Lab Results  Component Value Date   TRIG 339 (H) 10/23/2019   Lab Results  Component Value Date   CHOLHDL 5.8 (H) 10/23/2019   Lab Results  Component Value Date   HGBA1C 7.5 (H) 01/01/2020       Assessment & Plan:   Problem List Items Addressed This Visit      Cardiovascular and Mediastinum   Venous insufficiency of left leg - Primary    Symptoms and presentation consistent with venous insufficiency of the left leg resulting in end of day ankle edema.  Her right leg is not edematous on examination today. Varicose and dilated veins are visible on the left leg greater than the right.  Discussed the importance of drinking plenty of fluids during the day and wearing compression stockings during the day.  Encouraged intermittently sitting down during the day and propping her feet up on pillow above the heart to help with venous return. Follow-up in 2 weeks if symptoms are still present.          Other   Blood pressure instability    It is unclear at this time if her blood pressure monitor at home is off on calibration or if she is having significant variations in her blood pressure after taking her medication.  Discussed bringing her blood pressure cuff into the office so we can check it with our equipment.  Discussed monitoring blood pressure first thing in the morning, as she has been, and also monitoring in the [place] afternoon after she has taken her medication to see if this is a typical drop in blood pressure or an incidental finding today.  I recommend that she take her blood pressure  readings to the cardiologist to discuss these with him to determine if medication changes need to be made.  She has had several falls recently and her blood pressure could be the culprit for the symptoms of dizziness and falls that she has had.  Follow-up sooner if her blood pressure continues to run low in the afternoon.       Need for influenza vaccination    Flu vaccine provided.       Relevant Orders   Flu Vaccine QUAD High Dose(Fluad) (Completed)       No orders of the defined types were placed in this encounter.    Tollie Eth, NP

## 2020-02-10 NOTE — Assessment & Plan Note (Signed)
Flu vaccine provided.

## 2020-02-10 NOTE — Assessment & Plan Note (Signed)
Symptoms and presentation consistent with venous insufficiency of the left leg resulting in end of day ankle edema.  Her right leg is not edematous on examination today. Varicose and dilated veins are visible on the left leg greater than the right.  Discussed the importance of drinking plenty of fluids during the day and wearing compression stockings during the day.  Encouraged intermittently sitting down during the day and propping her feet up on pillow above the heart to help with venous return. Follow-up in 2 weeks if symptoms are still present.

## 2020-02-16 ENCOUNTER — Other Ambulatory Visit: Payer: Self-pay | Admitting: Osteopathic Medicine

## 2020-02-16 DIAGNOSIS — F331 Major depressive disorder, recurrent, moderate: Secondary | ICD-10-CM

## 2020-02-17 NOTE — Telephone Encounter (Signed)
Last refill-11/11/2019 Last Ov- 01/27/2020

## 2020-02-19 ENCOUNTER — Other Ambulatory Visit: Payer: Self-pay | Admitting: Osteopathic Medicine

## 2020-02-19 DIAGNOSIS — Z794 Long term (current) use of insulin: Secondary | ICD-10-CM

## 2020-02-19 DIAGNOSIS — E118 Type 2 diabetes mellitus with unspecified complications: Secondary | ICD-10-CM

## 2020-02-21 ENCOUNTER — Other Ambulatory Visit: Payer: Self-pay | Admitting: Osteopathic Medicine

## 2020-02-21 DIAGNOSIS — E118 Type 2 diabetes mellitus with unspecified complications: Secondary | ICD-10-CM

## 2020-02-21 DIAGNOSIS — Z794 Long term (current) use of insulin: Secondary | ICD-10-CM

## 2020-02-24 ENCOUNTER — Other Ambulatory Visit: Payer: Self-pay | Admitting: Osteopathic Medicine

## 2020-02-28 ENCOUNTER — Other Ambulatory Visit: Payer: Self-pay | Admitting: Osteopathic Medicine

## 2020-02-28 DIAGNOSIS — M542 Cervicalgia: Secondary | ICD-10-CM

## 2020-03-12 ENCOUNTER — Other Ambulatory Visit: Payer: Self-pay | Admitting: Osteopathic Medicine

## 2020-03-12 DIAGNOSIS — F331 Major depressive disorder, recurrent, moderate: Secondary | ICD-10-CM

## 2020-03-13 ENCOUNTER — Other Ambulatory Visit: Payer: Self-pay | Admitting: Osteopathic Medicine

## 2020-03-25 ENCOUNTER — Other Ambulatory Visit: Payer: Self-pay

## 2020-03-25 ENCOUNTER — Ambulatory Visit (INDEPENDENT_AMBULATORY_CARE_PROVIDER_SITE_OTHER): Payer: Medicare HMO | Admitting: Osteopathic Medicine

## 2020-03-25 ENCOUNTER — Encounter: Payer: Self-pay | Admitting: Osteopathic Medicine

## 2020-03-25 VITALS — BP 131/77 | HR 99 | Temp 98.6°F | Wt 193.0 lb

## 2020-03-25 DIAGNOSIS — N393 Stress incontinence (female) (male): Secondary | ICD-10-CM

## 2020-03-25 NOTE — Progress Notes (Signed)
Autumn Bowen is a 74 y.o. female who presents to  [place] at [facility]  today, 03/25/20, seeking care for the following:  . Urinary incontinence, persistent vaginal yeast infections. Most recent A1C at goal. Significant urinary incontinence including nighttime in her sleep. Hx bladder sling procedure of some sort in 1980s. Would like referral to address this issue.      ASSESSMENT & PLAN with other pertinent findings:  The encounter diagnosis was SUI (stress urinary incontinence, female).   No results found for this or any previous visit (from the past 24 hour(s)).  There are no Patient Instructions on file for this visit.  Orders Placed This Encounter  Procedures  . Ambulatory referral to Urology    No orders of the defined types were placed in this encounter.      Follow-up instructions: Return if symptoms worsen or fail to improve.                                         BP 131/77 (BP Location: Left Arm, Patient Position: Sitting, Cuff Size: Normal)   Pulse 99   Temp 98.6 F (37 C) (Oral)   Wt 193 lb 0.6 oz (87.6 kg)   LMP 09/21/1974   BMI 34.20 kg/m   Current Meds  Medication Sig  . albuterol (PROVENTIL HFA;VENTOLIN HFA) 108 (90 Base) MCG/ACT inhaler Inhale 2 puffs into the lungs every 4 (four) hours as needed for wheezing.  . Alcohol Swabs 70 % PADS 1 Units by Does not apply route 3 (three) times daily.  . AMBULATORY NON FORMULARY MEDICATION Adult Diaper Dx Stress urinary incontinence - size and brand per patient preference and insurance coverage, QS 90 days w/ refill x99  . AMBULATORY NON FORMULARY MEDICATION Adult incontinence pads- size and brand per patient preference and insurance coverage.  Marland Kitchen amitriptyline (ELAVIL) 50 MG tablet   . aspirin EC 81 MG tablet Take 1 tablet (81 mg total) by mouth daily.  . cholecalciferol (VITAMIN D) 1000 units tablet Take 1,000 Units by  mouth daily.  Marland Kitchen dicyclomine (BENTYL) 10 MG capsule Take 1 capsule (10 mg total) by mouth 2 (two) times daily.  Marland Kitchen EPINEPHRINE 0.3 mg/0.3 mL IJ SOAJ injection USE AS DIRECTED  . famotidine (PEPCID) 20 MG tablet TAKE 1 TABLET(20 MG) BY MOUTH DAILY  . Fluticasone-Salmeterol (ADVAIR DISKUS) 250-50 MCG/DOSE AEPB Inhale 1 puff into the lungs 2 (two) times daily.  Marland Kitchen gabapentin (NEURONTIN) 300 MG capsule Take 621m TID.  May increase to 9058mTID if needed.  . Marland Kitchenbuprofen (ADVIL) 800 MG tablet TAKE 1 TABLET(800 MG) BY MOUTH EVERY 8 HOURS AS NEEDED FOR PAIN  . insulin degludec (TRESIBA FLEXTOUCH) 200 UNIT/ML FlexTouch Pen Inject 30-60 Units into the skin at bedtime.  . Insulin Pen Needle (BD PEN NEEDLE NANO 2ND GEN) 32G X 4 MM MISC 1 Device by Does not apply route as needed (with insulin).  . isosorbide mononitrate (IMDUR) 30 MG 24 hr tablet Take 1 tablet (30 mg total) by mouth daily. Need office visit w/ cardiology for more refills  . levothyroxine (SYNTHROID) 175 MCG tablet TAKE 1 TABLET BY MOUTH DAILY BEFORE BREAKFAST  . LUMIGAN 0.01 % SOLN Instill 1 drop into both eyes every night  . meloxicam (MOBIC) 15 MG tablet TAKE 1 TABLET(15 MG) BY MOUTH DAILY WITH FOOD  . metFORMIN (GLUCOPHAGE-XR) 750 MG 24  hr tablet Take two tablets by mouth daily with breakfast.  . Multiple Vitamins-Minerals (PRESERVISION AREDS 2 PO) Take 1 capsule by mouth 2 (two) times daily.  . nitroGLYCERIN (NITROSTAT) 0.4 MG SL tablet Place 1 tablet (0.4 mg total) under the tongue every 5 (five) minutes as needed for chest pain.  Marland Kitchen QUEtiapine (SEROQUEL) 200 MG tablet TAKE 1 TABLET(200 MG) BY MOUTH AT BEDTIME  . rosuvastatin (CRESTOR) 40 MG tablet TAKE 1 TABLET(40 MG) BY MOUTH DAILY  . sertraline (ZOLOFT) 100 MG tablet TAKE 2 TABLETS(200 MG) BY MOUTH DAILY  . traMADol (ULTRAM) 50 MG tablet Take 1-2 tablets (50-100 mg total) by mouth every 8 (eight) hours as needed for moderate pain. Maximum 6 tabs per day.    No results found for this or  any previous visit (from the past 72 hour(s)).  No results found.     All questions at time of visit were answered - patient instructed to contact office with any additional concerns or updates.  ER/RTC precautions were reviewed with the patient as applicable.   Please note: voice recognition software was used to produce this document, and typos may escape review. Please contact Dr. Sheppard Coil for any needed clarificatio

## 2020-03-27 ENCOUNTER — Other Ambulatory Visit: Payer: Self-pay

## 2020-03-27 MED ORDER — ALBUTEROL SULFATE HFA 108 (90 BASE) MCG/ACT IN AERS: 2.0000 | INHALATION_SPRAY | RESPIRATORY_TRACT | 5 refills | Status: AC | PRN

## 2020-04-10 NOTE — Progress Notes (Signed)
HPI: Follow-up coronary artery disease. Previously followed by Dr. [name]. Abdominal CT November 2017 showed atherosclerosis but no aneurysm. Patient had cardiac catheterization July 2019 due to CP.There was a 60% mid right coronary artery lesion with FFR of 0.94. No other coronary disease noted.Carotid Dopplers January 2020 showed 1 to 39% right and near normal left. Since last seen,she has some dyspnea on exertion.  Occasional brief chest pain but no exertional chest pain.  No palpitations.  She has had problems with dizziness with standing.  She has fallen several times last in July.  Current Outpatient Medications  Medication Sig Dispense Refill  . albuterol (VENTOLIN HFA) 108 (90 Base) MCG/ACT inhaler Inhale 2 puffs into the lungs every 4 (four) hours as needed for wheezing. 2 each 5  . Alcohol Swabs 70 % PADS 1 Units by Does not apply route 3 (three) times daily. 120 each 5  . AMBULATORY NON FORMULARY MEDICATION Adult Diaper Dx Stress urinary incontinence - size and brand per patient preference and insurance coverage, QS 90 days w/ refill x99 100 Units 99  . AMBULATORY NON FORMULARY MEDICATION Adult incontinence pads- size and brand per patient preference and insurance coverage. 100 Units 99  . amitriptyline (ELAVIL) 50 MG tablet     . aspirin EC 81 MG tablet Take 1 tablet (81 mg total) by mouth daily. 90 tablet 3  . cholecalciferol (VITAMIN D) 1000 units tablet Take 1,000 Units by mouth daily.    Marland Kitchen dicyclomine (BENTYL) 10 MG capsule Take 1 capsule (10 mg total) by mouth 2 (two) times daily. 180 capsule 3  . EPINEPHRINE 0.3 mg/0.3 mL IJ SOAJ injection USE AS DIRECTED 2 each 0  . famotidine (PEPCID) 20 MG tablet TAKE 1 TABLET(20 MG) BY MOUTH DAILY 90 tablet 0  . Fluticasone-Salmeterol (ADVAIR DISKUS) 250-50 MCG/DOSE AEPB Inhale 1 puff into the lungs 2 (two) times daily. 60 each 12  . gabapentin (NEURONTIN) 300 MG capsule Take 639m TID.  May increase to 9037mTID if needed.  180 capsule 3  . ibuprofen (ADVIL) 800 MG tablet TAKE 1 TABLET(800 MG) BY MOUTH EVERY 8 HOURS AS NEEDED FOR PAIN 30 tablet 0  . insulin degludec (TRESIBA FLEXTOUCH) 200 UNIT/ML FlexTouch Pen Inject 30-60 Units into the skin at bedtime. (Patient taking differently: Inject 28 Units into the skin at bedtime. ) 15 mL 99  . Insulin Pen Needle (BD PEN NEEDLE NANO 2ND GEN) 32G X 4 MM MISC 1 Device by Does not apply route as needed (with insulin). 100 each 5  . isosorbide mononitrate (IMDUR) 30 MG 24 hr tablet Take 1 tablet (30 mg total) by mouth daily. Need office visit w/ cardiology for more refills 90 tablet 3  . levothyroxine (SYNTHROID) 175 MCG tablet TAKE 1 TABLET BY MOUTH DAILY BEFORE BREAKFAST 90 tablet 0  . LUMIGAN 0.01 % SOLN Instill 1 drop into both eyes every night  3  . meloxicam (MOBIC) 15 MG tablet TAKE 1 TABLET(15 MG) BY MOUTH DAILY WITH FOOD 90 tablet 0  . metFORMIN (GLUCOPHAGE-XR) 750 MG 24 hr tablet Take two tablets by mouth daily with breakfast. 180 tablet 1  . Multiple Vitamins-Minerals (PRESERVISION AREDS 2 PO) Take 1 capsule by mouth 2 (two) times daily.    . QUEtiapine (SEROQUEL) 200 MG tablet TAKE 1 TABLET(200 MG) BY MOUTH AT BEDTIME 90 tablet 0  . rosuvastatin (CRESTOR) 40 MG tablet TAKE 1 TABLET(40 MG) BY MOUTH DAILY 90 tablet 3  . sertraline (ZOLOFT) 100 MG  tablet TAKE 2 TABLETS(200 MG) BY MOUTH DAILY 180 tablet 0  . traMADol (ULTRAM) 50 MG tablet Take 1-2 tablets (50-100 mg total) by mouth every 8 (eight) hours as needed for moderate pain. Maximum 6 tabs per day. 21 tablet 0  . nitroGLYCERIN (NITROSTAT) 0.4 MG SL tablet Place 1 tablet (0.4 mg total) under the tongue every 5 (five) minutes as needed for chest pain. 25 tablet 11   No current facility-administered medications for this visit.     Past Medical History:  Diagnosis Date  . Allergy    Notes that grass and some trees causes eyes to sting, burn, and water.  . Anemia   . Celiac disease   . Celiac disease   . COPD  (chronic obstructive pulmonary disease) ([place])   . Diabetes mellitus type II   . Diverticulitis   . Dyslipidemia   . HOH (hard of hearing)   . Hypertension   . Hypothyroidism   . Sleep apnea   . Sore throat     Past Surgical History:  Procedure Laterality Date  . APPENDECTOMY    . BREAST SURGERY     h/o benign cystleft breast and lymp node removal on right breast area.  Marland Kitchen EYE SURGERY     Left eye cataract removal, right eye h/o macular degeneration.  . INTRAVASCULAR PRESSURE WIRE/FFR STUDY N/A 12/07/2017   Procedure: INTRAVASCULAR PRESSURE WIRE/FFR STUDY;  Surgeon: Leonie Man, MD;  Location: [facility];  Service: Cardiovascular;  Laterality: N/A;  . LEFT HEART CATH AND CORONARY ANGIOGRAPHY N/A 12/07/2017   Procedure: LEFT HEART CATH AND CORONARY ANGIOGRAPHY;  Surgeon: Leonie Man, MD;  Location: [facility];  Service: Cardiovascular;  Laterality: N/A;  . SPINE SURGERY     Pt not sure of the type of surgery she had but knows it was L4-5.  Marland Kitchen VAGINAL HYSTERECTOMY      Social History   Socioeconomic History  . Marital status: Divorced    Spouse name: Not on file  . Number of children: Not on file  . Years of education: Not on file  . Highest education level: Not on file  Occupational History  . Not on file  Tobacco Use  . Smoking status: Current Some Day Smoker    Packs/day: 0.25    Years: 51.00    Pack years: 12.75    Types: Cigarettes    Start date: 01/02/2012  . Smokeless tobacco: Never Used  . Tobacco comment: Been down to 4 - 6 cigs a day  Vaping Use  . Vaping Use: Never used  Substance and Sexual Activity  . Alcohol use: No  . Drug use: No  . Sexual activity: Not Currently  Other Topics Concern  . Not on file  Social History Narrative  . Not on file   Social Determinants of Health   Financial Resource Strain:   . Difficulty of Paying Living Expenses: Not on file  Food Insecurity:   . Worried About Charity fundraiser in the Last  Year: Not on file  . Ran Out of Food in the Last Year: Not on file  Transportation Needs:   . Lack of Transportation (Medical): Not on file  . Lack of Transportation (Non-Medical): Not on file  Physical Activity:   . Days of Exercise per Week: Not on file  . Minutes of Exercise per Session: Not on file  Stress:   . Feeling of Stress : Not on file  Social Connections:   .  Frequency of Communication with Friends and Family: Not on file  . Frequency of Social Gatherings with Friends and Family: Not on file  . Attends Religious Services: Not on file  . Active Member of Clubs or Organizations: Not on file  . Attends Archivist Meetings: Not on file  . Marital Status: Not on file  Intimate Partner Violence:   . Fear of Current or Ex-Partner: Not on file  . Emotionally Abused: Not on file  . Physically Abused: Not on file  . Sexually Abused: Not on file    Family History  Problem Relation Age of Onset  . Anxiety disorder Mother   . Depression Mother   . Heart attack Mother   . Stroke Mother   . Diabetes type II Mother   . Diabetes Father   . Glaucoma Father   . Hypertension Father   . COPD Father   . COPD Sister   . Hashimoto's thyroiditis Sister   . Cancer Sister   . Kidney disease Sister   . Drug abuse Brother   . Alcohol abuse Brother   . Asthma Son   . Diabetes Son     ROS: no fevers or chills, productive cough, hemoptysis, dysphasia, odynophagia, melena, hematochezia, dysuria, hematuria, rash, seizure activity, orthopnea, PND, pedal edema, claudication. Remaining systems are negative.  Physical Exam: Well-developed well-nourished in no acute distress.  Skin is warm and dry.  HEENT is normal.  Neck is supple.  Chest is clear to auscultation with normal expansion.  Cardiovascular exam is regular rate and rhythm.  Abdominal exam nontender or distended. No masses palpated. Extremities show no edema. neuro grossly intact  A/P  1 CAD-no recent exertional  CP; continue ASA and statin.   2 Carotid artery disease-mild on most recent dopplers; continue medical therapy.  3 hypertension-blood pressure has been running low.  She has had some orthostatic symptoms and fell twice in July.  Discontinue isosorbide to allow blood pressure to run higher.  I explained the importance of staying hydrated and increasing sodium intake.  Can consider compression hose in the future if needed.  4 hyperlipidemia-continue statin.  Check lipids and liver.  5 tobacco abuse-pt counseled on discontinuing.  Kirk Ruths, MD

## 2020-04-15 ENCOUNTER — Ambulatory Visit (INDEPENDENT_AMBULATORY_CARE_PROVIDER_SITE_OTHER): Payer: Medicare HMO | Admitting: Cardiology

## 2020-04-15 ENCOUNTER — Other Ambulatory Visit: Payer: Self-pay

## 2020-04-15 ENCOUNTER — Encounter: Payer: Self-pay | Admitting: Cardiology

## 2020-04-15 VITALS — BP 125/76 | HR 96 | Ht 63.0 in | Wt 191.0 lb

## 2020-04-15 DIAGNOSIS — E7800 Pure hypercholesterolemia, unspecified: Secondary | ICD-10-CM

## 2020-04-15 DIAGNOSIS — I2510 Atherosclerotic heart disease of native coronary artery without angina pectoris: Secondary | ICD-10-CM

## 2020-04-15 DIAGNOSIS — E1169 Type 2 diabetes mellitus with other specified complication: Secondary | ICD-10-CM

## 2020-04-15 DIAGNOSIS — I10 Essential (primary) hypertension: Secondary | ICD-10-CM

## 2020-04-15 DIAGNOSIS — I679 Cerebrovascular disease, unspecified: Secondary | ICD-10-CM | POA: Diagnosis not present

## 2020-04-15 DIAGNOSIS — E785 Hyperlipidemia, unspecified: Secondary | ICD-10-CM | POA: Diagnosis not present

## 2020-04-15 NOTE — Patient Instructions (Signed)
Medication Instructions:   STOP ISOSORBIDE  *If you need a refill on your cardiac medications before your next appointment, please call your pharmacy*   Lab Work:  Your physician recommends that you return for lab work FASTING  If you have labs (blood work) drawn today and your tests are completely normal, you will receive your results only by: Marland Kitchen MyChart Message (if you have MyChart) OR . A paper copy in the mail If you have any lab test that is abnormal or we need to change your treatment, we will call you to review the results.   Follow-Up: At [facility], you and your health needs are our priority.  As part of our continuing mission to provide you with exceptional heart care, we have created designated Provider Care Teams.  These Care Teams include your primary Cardiologist (physician) and Advanced Practice Providers (APPs -  Physician Assistants and Nurse Practitioners) who all work together to provide you with the care you need, when you need it.  We recommend signing up for the patient portal called "MyChart".  Sign up information is provided on this After Visit Summary.  MyChart is used to connect with patients for Virtual Visits (Telemedicine).  Patients are able to view lab/test results, encounter notes, upcoming appointments, etc.  Non-urgent messages can be sent to your provider as well.   To learn more about what you can do with MyChart, go to [email or web address].    Your next appointment:   6 month(s)  The format for your next appointment:   In Person  Provider:   Kirk Ruths, MD

## 2020-05-05 ENCOUNTER — Ambulatory Visit: Payer: Medicare HMO | Admitting: Osteopathic Medicine

## 2020-05-06 ENCOUNTER — Other Ambulatory Visit: Payer: Self-pay | Admitting: Sports Medicine

## 2020-05-06 DIAGNOSIS — M19012 Primary osteoarthritis, left shoulder: Secondary | ICD-10-CM

## 2020-05-14 ENCOUNTER — Ambulatory Visit (INDEPENDENT_AMBULATORY_CARE_PROVIDER_SITE_OTHER): Payer: Medicare HMO

## 2020-05-14 ENCOUNTER — Ambulatory Visit (INDEPENDENT_AMBULATORY_CARE_PROVIDER_SITE_OTHER): Payer: Medicare HMO | Admitting: Osteopathic Medicine

## 2020-05-14 ENCOUNTER — Other Ambulatory Visit: Payer: Self-pay

## 2020-05-14 ENCOUNTER — Encounter: Payer: Self-pay | Admitting: Osteopathic Medicine

## 2020-05-14 VITALS — BP 118/74 | HR 96 | Temp 98.8°F | Wt 188.1 lb

## 2020-05-14 DIAGNOSIS — I208 Other forms of angina pectoris: Secondary | ICD-10-CM

## 2020-05-14 DIAGNOSIS — Z794 Long term (current) use of insulin: Secondary | ICD-10-CM

## 2020-05-14 DIAGNOSIS — J449 Chronic obstructive pulmonary disease, unspecified: Secondary | ICD-10-CM

## 2020-05-14 DIAGNOSIS — R600 Localized edema: Secondary | ICD-10-CM | POA: Diagnosis not present

## 2020-05-14 DIAGNOSIS — I82813 Embolism and thrombosis of superficial veins of lower extremities, bilateral: Secondary | ICD-10-CM

## 2020-05-14 DIAGNOSIS — I82611 Acute embolism and thrombosis of superficial veins of right upper extremity: Secondary | ICD-10-CM | POA: Diagnosis not present

## 2020-05-14 DIAGNOSIS — M19012 Primary osteoarthritis, left shoulder: Secondary | ICD-10-CM

## 2020-05-14 DIAGNOSIS — I8003 Phlebitis and thrombophlebitis of superficial vessels of lower extremities, bilateral: Secondary | ICD-10-CM

## 2020-05-14 DIAGNOSIS — E118 Type 2 diabetes mellitus with unspecified complications: Secondary | ICD-10-CM

## 2020-05-14 DIAGNOSIS — I82890 Acute embolism and thrombosis of other specified veins: Secondary | ICD-10-CM | POA: Diagnosis not present

## 2020-05-14 LAB — POCT GLYCOSYLATED HEMOGLOBIN (HGB A1C): Hemoglobin A1C: 7 % — AB (ref 4.0–5.6)

## 2020-05-14 MED ORDER — MOMETASONE FURO-FORMOTEROL FUM 200-5 MCG/ACT IN AERO: 2.0000 | INHALATION_SPRAY | Freq: Two times a day (BID) | RESPIRATORY_TRACT | Status: DC

## 2020-05-14 MED ORDER — TRAMADOL HCL 50 MG PO TABS
50.0000 mg | ORAL_TABLET | Freq: Three times a day (TID) | ORAL | 0 refills | Status: DC | PRN
Start: 2020-05-14 — End: 2020-05-28

## 2020-05-14 NOTE — Patient Instructions (Addendum)
Plan:  Ultrasound today to evaluate for blood clot - seems unlikely but let's check!   Chest pain - use nitroglycerin as you have been. I'll message Dr Stanford Breed and see what else he might suggest. If worse, 911/ER!   Inhaler - will send alternative to the Advair Disc. If still having issues, let us know.   Dr. Griffin Basil - Shoulder: [phone] Dr. Redmond Pulling - Urology: [phone]  Sugars look good!   Refilled Tramadol

## 2020-05-14 NOTE — Progress Notes (Signed)
HPI: Autumn Bowen is a 74 y.o. female who  has a past medical history of Allergy, Anemia, Celiac disease, Celiac disease, COPD (chronic obstructive pulmonary disease) ([place]), Diabetes mellitus type II, Diverticulitis, Dyslipidemia, HOH (hard of hearing), Hypertension, Hypothyroidism, Sleep apnea, and Sore throat.  she presents to [facility] today, 05/14/20,  for chief complaint of:  Chest pressure/pain  . Context: pt with CAD s/p cardiac cath that showed 60% mid right coronary lesion; isosorbide d/c by cardiology in November due to lower bp and falls. Known significant L shoulder arthritis. Cardiology d/c Isosorbide d/t hypotension/orthostatic symptoms  . Location/quality: sharp L chest pain that radiates down L hsoulder and arm; L chest pressure ("feels like elephant sitting on my chest") . Severity: increasing in frequency; sharp pains occurring at least 2-3 times a week now and chest pressure is often present daily . Duration: 3 months . Timing: occurring at rest and with exertion  . Modifying factors: nitroglycerin 1 tab sometimes helps sharp pain, 2 tabs always relieves sharp pain; pressure sensation not improved by nitroglycerin  . Assoc signs/symptoms:  o No shortness of breath, palpitations, fever  L shoulder pain  Pt has been following with Dr. Darene Lamer and was referred to orthopedic surgery, but has not been able to see them. Pain radiates down L arm occasionally and may be contributing to some of the radiation of pain down L arm with chest pain.   Leg swelling  Context: pt with CAD, primarily sedentary lifestyle   Location: R calf  Duration: unknown   Assoc signs/sx: pain with ankle flexion and calf pressure  No fever, shortness of breath     Past medical, surgical, social and family history reviewed:  Patient Active Problem List   Diagnosis Date Noted  . Venous insufficiency of left leg 02/10/2020  . Blood pressure instability  02/10/2020  . Need for influenza vaccination 02/10/2020  . Mixed stress and urge urinary incontinence 01/27/2020  . Vulvovaginal candidiasis 01/27/2020  . Financial difficulties 01/27/2020  . Primary osteoarthritis of left shoulder 12/04/2019  . Dysuria 08/29/2019  . Spinal stenosis of lumbosacral region 03/19/2019  . HTN (hypertension) 02/08/2019  . Injury of left knee 02/21/2018  . Microalbuminuria due to type 2 diabetes mellitus ([place]) 02/21/2018  . Hyperlipidemia associated with type 2 diabetes mellitus ([place]) 02/21/2018  . CAD (coronary artery disease) 01/01/2018  . Chronic pain syndrome 12/13/2017  . Angina pectoris ([place]) 12/07/2017  . Diabetes mellitus due to underlying condition with unspecified complications ([place]) 73/71/0626  . Radiographic dye allergy status 12/05/2017  . Pseudogout of left knee 11/16/2017  . Diverticulitis of colon 04/12/2016  . Broken or cracked tooth, nontraumatic 03/27/2016  . Memory deficit 05/05/2015  . Hearing difficulty of both ears 05/05/2015  . OSA (obstructive sleep apnea) 04/28/2015  . Seborrheic keratoses 01/31/2015  . Left-sided low back pain with left-sided sciatica 01/31/2015  . GERD (gastroesophageal reflux disease) 01/26/2015  . Celiac disease 01/26/2015  . Cervical spondylosis 12/04/2014  . Osteoarthritis of first metatarsophalangeal joint 11/17/2014  . Seasonal allergies 10/04/2014  . Neuropathy 10/04/2014  . Toenail fungus 10/03/2014  . Adenomatous polyp of colon 07/30/2014  . Macular degeneration, dry 07/29/2014  . Type 2 diabetes mellitus with complication, with long-term current use of insulin ([place]) 07/28/2014  . Migraine without aura and without status migrainosus, not intractable 07/28/2014  . COPD (chronic obstructive pulmonary disease) with chronic bronchitis ([place]) 05/10/2009  . VISION DISORDER 12/03/2008  . OSTEOPENIA 11/18/2008  . VITAMIN  D DEFICIENCY 11/06/2008  . Major depressive disorder 11/05/2008  . FATIGUE  11/05/2008  . ANEMIA 10/23/2008  . DYSLIPIDEMIA 10/16/2008  . TOBACCO ABUSE 10/15/2008  . ALLERGIC RHINITIS 10/15/2008  . POST TRAUMATIC STRESS SYNDROME 05/30/2000  . Hypothyroidism 05/30/1988  . POSTMENOPAUSAL STATUS 05/30/1968    Past Surgical History:  Procedure Laterality Date  . APPENDECTOMY    . BREAST SURGERY     h/o benign cystleft breast and lymp node removal on right breast area.  Marland Kitchen EYE SURGERY     Left eye cataract removal, right eye h/o macular degeneration.  . INTRAVASCULAR PRESSURE WIRE/FFR STUDY N/A 12/07/2017   Procedure: INTRAVASCULAR PRESSURE WIRE/FFR STUDY;  Surgeon: Leonie Man, MD;  Location: [facility];  Service: Cardiovascular;  Laterality: N/A;  . LEFT HEART CATH AND CORONARY ANGIOGRAPHY N/A 12/07/2017   Procedure: LEFT HEART CATH AND CORONARY ANGIOGRAPHY;  Surgeon: Leonie Man, MD;  Location: [facility];  Service: Cardiovascular;  Laterality: N/A;  . SPINE SURGERY     Pt not sure of the type of surgery she had but knows it was L4-5.  Marland Kitchen VAGINAL HYSTERECTOMY      Social History   Tobacco Use  . Smoking status: Current Some Day Smoker    Packs/day: 0.25    Years: 51.00    Pack years: 12.75    Types: Cigarettes    Start date: 01/02/2012  . Smokeless tobacco: Never Used  . Tobacco comment: Been down to 4 - 6 cigs a day  Substance Use Topics  . Alcohol use: No    Family History  Problem Relation Age of Onset  . Anxiety disorder Mother   . Depression Mother   . Heart attack Mother   . Stroke Mother   . Diabetes type II Mother   . Diabetes Father   . Glaucoma Father   . Hypertension Father   . COPD Father   . COPD Sister   . Hashimoto's thyroiditis Sister   . Cancer Sister   . Kidney disease Sister   . Drug abuse Brother   . Alcohol abuse Brother   . Asthma Son   . Diabetes Son      Current medication list and allergy/intolerance information reviewed:    Current Outpatient Medications  Medication Sig Dispense  Refill  . albuterol (VENTOLIN HFA) 108 (90 Base) MCG/ACT inhaler Inhale 2 puffs into the lungs every 4 (four) hours as needed for wheezing. 2 each 5  . Alcohol Swabs 70 % PADS 1 Units by Does not apply route 3 (three) times daily. 120 each 5  . AMBULATORY NON FORMULARY MEDICATION Adult Diaper Dx Stress urinary incontinence - size and brand per patient preference and insurance coverage, QS 90 days w/ refill x99 100 Units 99  . AMBULATORY NON FORMULARY MEDICATION Adult incontinence pads- size and brand per patient preference and insurance coverage. 100 Units 99  . amitriptyline (ELAVIL) 50 MG tablet     . aspirin EC 81 MG tablet Take 1 tablet (81 mg total) by mouth daily. 90 tablet 3  . cholecalciferol (VITAMIN D) 1000 units tablet Take 1,000 Units by mouth daily.    Marland Kitchen dicyclomine (BENTYL) 10 MG capsule Take 1 capsule (10 mg total) by mouth 2 (two) times daily. 180 capsule 3  . EPINEPHRINE 0.3 mg/0.3 mL IJ SOAJ injection USE AS DIRECTED 2 each 0  . famotidine (PEPCID) 20 MG tablet TAKE 1 TABLET(20 MG) BY MOUTH DAILY 90 tablet 0  . gabapentin (  NEURONTIN) 300 MG capsule Take 672m TID.  May increase to 9078mTID if needed. 180 capsule 3  . ibuprofen (ADVIL) 800 MG tablet TAKE 1 TABLET(800 MG) BY MOUTH EVERY 8 HOURS AS NEEDED FOR PAIN 30 tablet 0  . insulin degludec (TRESIBA FLEXTOUCH) 200 UNIT/ML FlexTouch Pen Inject 30-60 Units into the skin at bedtime. (Patient taking differently: Inject 28 Units into the skin at bedtime.) 15 mL 99  . Insulin Pen Needle (BD PEN NEEDLE NANO 2ND GEN) 32G X 4 MM MISC 1 Device by Does not apply route as needed (with insulin). 100 each 5  . levothyroxine (SYNTHROID) 175 MCG tablet TAKE 1 TABLET BY MOUTH DAILY BEFORE BREAKFAST 90 tablet 0  . LUMIGAN 0.01 % SOLN Instill 1 drop into both eyes every night  3  . meloxicam (MOBIC) 15 MG tablet TAKE 1 TABLET(15 MG) BY MOUTH DAILY WITH FOOD 90 tablet 0  . metFORMIN (GLUCOPHAGE-XR) 750 MG 24 hr tablet Take two tablets by mouth  daily with breakfast. 180 tablet 1  . Multiple Vitamins-Minerals (PRESERVISION AREDS 2 PO) Take 1 capsule by mouth 2 (two) times daily.    . QUEtiapine (SEROQUEL) 200 MG tablet TAKE 1 TABLET(200 MG) BY MOUTH AT BEDTIME 90 tablet 0  . rosuvastatin (CRESTOR) 40 MG tablet TAKE 1 TABLET(40 MG) BY MOUTH DAILY 90 tablet 3  . sertraline (ZOLOFT) 100 MG tablet TAKE 2 TABLETS(200 MG) BY MOUTH DAILY 180 tablet 0  . nitroGLYCERIN (NITROSTAT) 0.4 MG SL tablet Place 1 tablet (0.4 mg total) under the tongue every 5 (five) minutes as needed for chest pain. 25 tablet 11  . traMADol (ULTRAM) 50 MG tablet Take 1-2 tablets (50-100 mg total) by mouth every 8 (eight) hours as needed for moderate pain. Maximum 6 tabs per day. 21 tablet 0   Current Facility-Administered Medications  Medication Dose Route Frequency Provider Last Rate Last Admin  . mometasone-formoterol (DULERA) 200-5 MCG/ACT inhaler 2 puff  2 puff Inhalation BID AlEmeterio ReeveDO        Allergies  Allergen Reactions  . Bee Venom Anaphylaxis  . Iodinated Diagnostic Agents Other (See Comments)    Per patient cardiac arrest IVP DYE-CARDIAC ARREST  . Penicillins Anaphylaxis    Has patient had a PCN reaction causing immediate rash, facial/tongue/throat swelling, SOB or lightheadedness with hypotension: yes Has patient had a PCN reaction causing severe rash involving mucus membranes or skin necrosis: no Has patient had a PCN reaction that required hospitalization: yes Has patient had a PCN reaction occurring within the last 10 years: no If all of the above answers are "NO", then may proceed with Cephalosporin use.   . Povidone Iodine Itching and Rash  . Tomato Itching  . Chocolate Rash  . Latex Rash  . Other Rash  . Quetiapine Fumarate Other (See Comments)    Per pt she can not take XR   . Trazodone And Nefazodone Hives  . Zolpidem Tartrate Nausea And Vomiting  . Gluten Meal   . Zolpidem Tartrate Nausea And Vomiting  . Atorvastatin  Calcium Nausea Only  . Esomeprazole Magnesium Nausea Only  . Metformin And Related Diarrhea      Review of Systems:  Constitutional:  No  fever No unintentional weight changes.   HEENT: No  headache  Cardiac: +  chest pain, +  pressure, No palpitations  Respiratory:  No  shortness of breath. No  Cough  Musculoskeletal: No new myalgia/arthralgia, + lower extremity swelling    Exam:  BP 118/74 (  BP Location: Left Arm, Patient Position: Sitting, Cuff Size: Normal)   Pulse 96   Temp 98.8 F (37.1 C) (Oral)   Wt 188 lb 1.9 oz (85.3 kg)   LMP 09/21/1974   BMI 33.32 kg/m   Constitutional: VS see above. General Appearance: alert, well-developed, well-nourished, NAD  Eyes: Normal lids and conjunctive, non-icteric sclera  Respiratory: Normal respiratory effort. + diffuse wheeze, no rhonchi, no rales  Cardiovascular: S1/S2 normal, no murmur, no rub/gallop auscultated. RRR. Mild lower extremity edema worse on R leg. Pedal pulse II/IV bilaterally DP and PT.   Musculoskeletal: R leg slightly more swollen and erythematous than L. + Hoffman sign on R leg.   Skin: warm, dry, intact.    Psychiatric:  Normal mood and affect.    Results for orders placed or performed in visit on 05/14/20 (from the past 72 hour(s))  POCT HgB A1C     Status: Abnormal   Collection Time: 05/14/20 10:56 AM  Result Value Ref Range   Hemoglobin A1C 7.0 (A) 4.0 - 5.6 %   HbA1c POC (<> result, manual entry)     HbA1c, POC (prediabetic range)     HbA1c, POC (controlled diabetic range)      No results found.   ASSESSMENT/PLAN: The primary encounter diagnosis was Stable angina pectoris ([place]). Diagnoses of Edema of right lower extremity, Type 2 diabetes mellitus with complication, with long-term current use of insulin ([facility]), Chronic obstructive pulmonary disease, unspecified COPD type ([place]), Primary osteoarthritis of left shoulder, and Thrombophlebitis of superficial veins of both lower extremities were also  pertinent to this visit.   Stable angina  EKG today showed no ST elevation, T wave inversions, or pathologic Q waves  Will send her cardiologist a message to inquire about restating isosorbide   Continue nitroglycerin as needed  ED precautions given   Shoulder osteoarthritis   Refilled tramadol Rx  Encouraged to set appt with Dr. Darene Lamer for future refills given his hx of managing this medication and her shoulder osteoarthritis  in general   Given phone number for Dr. Griffin Basil with orthopedic surgery [phone])  R lower extremity edema - minimal concern for DVT given lack of significant swelling  Venous US (+) superficial thrombophlebitis - see result note for pt instructions   DM type II - at goal   A1C today 7.0   Follow up in 4 months  COPD  Pt reports not sure how to use advair inhaler  Changed Rx to dulera   Chronic vaginal yeast infections  Gave patient phone number for urology to follow up on referral from last visit. [phone])    Orders Placed This Encounter  Procedures  . US Venous Img Lower Bilateral (DVT)  . POCT HgB A1C  . EKG 12-Lead    Meds ordered this encounter  Medications  . mometasone-formoterol (DULERA) 200-5 MCG/ACT inhaler 2 puff  . traMADol (ULTRAM) 50 MG tablet    Sig: Take 1-2 tablets (50-100 mg total) by mouth every 8 (eight) hours as needed for moderate pain. Maximum 6 tabs per day.    Dispense:  21 tablet    Refill:  0    Patient Instructions  Plan:  Ultrasound today to evaluate for blood clot - seems unlikely but let's check!   Chest pain - use nitroglycerin as you have been. I'll message Dr Stanford Breed and see what else he might suggest. If worse, 911/ER!   Inhaler - will send alternative to the Advair Disc. If still having issues, let us  know.   Dr. Griffin Basil - Shoulder: [phone] Dr. Redmond Pulling - Urology: [phone]  Sugars look good!   Refilled Tramadol         Visit summary with medication list and  pertinent instructions was printed for patient to review. All questions at time of visit were answered - patient instructed to contact office with any additional concerns or updates. ER/RTC precautions were reviewed with the patient.    Please note: voice recognition software was used to produce this document, and typos may escape review. Please contact Dr. Sheppard Coil for any needed clarifications.   Total time spent 40 minutes face to face w/ patient in evaluation/treatment, reviewing results, reaching out to specialist   Follow-up plan: Return in about 4 months (around 09/12/2020) for A1C check-up, sooner if needed .

## 2020-05-28 ENCOUNTER — Other Ambulatory Visit: Payer: Self-pay | Admitting: Osteopathic Medicine

## 2020-05-28 DIAGNOSIS — M19012 Primary osteoarthritis, left shoulder: Secondary | ICD-10-CM

## 2020-05-28 DIAGNOSIS — R109 Unspecified abdominal pain: Secondary | ICD-10-CM

## 2020-05-28 NOTE — Telephone Encounter (Signed)
Task completed. Pt has been updated of provider's note. Aware no further refills will be given for tramadol rx. Per pt, she does not have an upcoming appt with Dr. Griffin Basil.

## 2020-05-28 NOTE — Telephone Encounter (Signed)
Please call patient:  NO MORE TRAMADOL FROM ME AFTER THIS REFILL 05/28/20  She needs to see ortho Please confirm that she has follow-up in place w/ Dr Griffin Basil

## 2020-06-03 ENCOUNTER — Encounter: Payer: Medicare Other | Admitting: Family Medicine

## 2020-06-03 NOTE — Progress Notes (Signed)
Patient had a medicare wellness visit scheduled today via telephone. I called the patient three times [phone]. There was no answer on the phone number and the mail box was full and I was unable to leave a voicemail.

## 2020-06-10 ENCOUNTER — Other Ambulatory Visit: Payer: Self-pay | Admitting: Osteopathic Medicine

## 2020-06-10 ENCOUNTER — Other Ambulatory Visit: Payer: Self-pay | Admitting: Family Medicine

## 2020-06-10 DIAGNOSIS — F331 Major depressive disorder, recurrent, moderate: Secondary | ICD-10-CM

## 2020-06-12 ENCOUNTER — Other Ambulatory Visit: Payer: Self-pay | Admitting: Osteopathic Medicine

## 2020-07-10 ENCOUNTER — Encounter: Payer: Self-pay | Admitting: *Deleted

## 2020-08-07 ENCOUNTER — Other Ambulatory Visit: Payer: Self-pay | Admitting: Osteopathic Medicine

## 2020-08-29 ENCOUNTER — Other Ambulatory Visit: Payer: Self-pay | Admitting: Nurse Practitioner

## 2020-08-29 DIAGNOSIS — E118 Type 2 diabetes mellitus with unspecified complications: Secondary | ICD-10-CM

## 2020-08-29 DIAGNOSIS — Z794 Long term (current) use of insulin: Secondary | ICD-10-CM

## 2020-09-01 ENCOUNTER — Other Ambulatory Visit: Payer: Self-pay

## 2020-09-01 ENCOUNTER — Ambulatory Visit (INDEPENDENT_AMBULATORY_CARE_PROVIDER_SITE_OTHER): Payer: Medicare Other | Admitting: Osteopathic Medicine

## 2020-09-01 ENCOUNTER — Encounter: Payer: Self-pay | Admitting: Osteopathic Medicine

## 2020-09-01 VITALS — BP 112/50 | HR 91 | Temp 97.8°F | Wt 191.0 lb

## 2020-09-01 DIAGNOSIS — G4733 Obstructive sleep apnea (adult) (pediatric): Secondary | ICD-10-CM

## 2020-09-01 DIAGNOSIS — E785 Hyperlipidemia, unspecified: Secondary | ICD-10-CM

## 2020-09-01 DIAGNOSIS — Z780 Asymptomatic menopausal state: Secondary | ICD-10-CM

## 2020-09-01 DIAGNOSIS — E1169 Type 2 diabetes mellitus with other specified complication: Secondary | ICD-10-CM

## 2020-09-01 DIAGNOSIS — E039 Hypothyroidism, unspecified: Secondary | ICD-10-CM

## 2020-09-01 DIAGNOSIS — Z0000 Encounter for general adult medical examination without abnormal findings: Secondary | ICD-10-CM

## 2020-09-01 DIAGNOSIS — E118 Type 2 diabetes mellitus with unspecified complications: Secondary | ICD-10-CM

## 2020-09-01 DIAGNOSIS — I2510 Atherosclerotic heart disease of native coronary artery without angina pectoris: Secondary | ICD-10-CM | POA: Diagnosis not present

## 2020-09-01 DIAGNOSIS — L989 Disorder of the skin and subcutaneous tissue, unspecified: Secondary | ICD-10-CM

## 2020-09-01 DIAGNOSIS — Z794 Long term (current) use of insulin: Secondary | ICD-10-CM

## 2020-09-01 DIAGNOSIS — B351 Tinea unguium: Secondary | ICD-10-CM

## 2020-09-01 DIAGNOSIS — J449 Chronic obstructive pulmonary disease, unspecified: Secondary | ICD-10-CM

## 2020-09-01 NOTE — Patient Instructions (Addendum)
Will get referrals for  Dermatology  Podiatry   Orthopedics  Urology  Will fix medications for  Allergies (starting Rx Singulair)  Asthma (will send alternative inhaler) Will send you handicap forms in mail once these are filled out  Will get labs today
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
## 2020-09-02 LAB — HEMOGLOBIN A1C
Hgb A1c MFr Bld: 6.8 % of total Hgb — ABNORMAL HIGH (ref <5.7)
Mean Plasma Glucose: 148 mg/dL
eAG (mmol/L): 8.2 mmol/L

## 2020-09-02 LAB — TSH: TSH: 0.71 mIU/L (ref 0.40–4.50)

## 2020-09-02 LAB — COMPLETE METABOLIC PANEL WITH GFR
AG Ratio: 1.6 (calc) (ref 1.0–2.5)
ALT: 14 U/L (ref 6–29)
AST: 23 U/L (ref 10–35)
Albumin: 4 g/dL (ref 3.6–5.1)
Alkaline phosphatase (APISO): 48 U/L (ref 37–153)
BUN: 14 mg/dL (ref 7–25)
CO2: 26 mmol/L (ref 20–32)
Calcium: 9.8 mg/dL (ref 8.6–10.4)
Chloride: 104 mmol/L (ref 98–110)
Creat: 0.65 mg/dL (ref 0.60–0.93)
GFR, Est African American: 101 mL/min/{1.73_m2} (ref >=60)
GFR, Est Non African American: 87 mL/min/{1.73_m2} (ref >=60)
Globulin: 2.5 g/dL (calc) (ref 1.9–3.7)
Glucose, Bld: 109 mg/dL (ref 65–139)
Potassium: 4.6 mmol/L (ref 3.5–5.3)
Sodium: 142 mmol/L (ref 135–146)
Total Bilirubin: 0.2 mg/dL (ref 0.2–1.2)
Total Protein: 6.5 g/dL (ref 6.1–8.1)

## 2020-09-02 LAB — VITAMIN D 25 HYDROXY (VIT D DEFICIENCY, FRACTURES): Vit D, 25-Hydroxy: 106 ng/mL — ABNORMAL HIGH (ref 30–100)

## 2020-09-02 LAB — CBC
HCT: 44.3 % (ref 35.0–45.0)
Hemoglobin: 13.5 g/dL (ref 11.7–15.5)
MCH: 27.4 pg (ref 27.0–33.0)
MCHC: 30.5 g/dL — ABNORMAL LOW (ref 32.0–36.0)
MCV: 90 fL (ref 80.0–100.0)
MPV: 10.5 fL (ref 7.5–12.5)
Platelets: 284 10*3/uL (ref 140–400)
RBC: 4.92 10*6/uL (ref 3.80–5.10)
RDW: 13.6 % (ref 11.0–15.0)
WBC: 9.2 10*3/uL (ref 3.8–10.8)

## 2020-09-02 LAB — LIPID PANEL
Cholesterol: 121 mg/dL (ref <200)
HDL: 60 mg/dL (ref >=50)
LDL Cholesterol (Calc): 40 mg/dL (calc)
Non-HDL Cholesterol (Calc): 61 mg/dL (calc) (ref <130)
Total CHOL/HDL Ratio: 2 (calc) (ref <5.0)
Triglycerides: 119 mg/dL (ref <150)

## 2020-09-03 ENCOUNTER — Telehealth: Payer: Self-pay

## 2020-09-03 NOTE — Telephone Encounter (Signed)
Pt called requesting for clinic to fax her most recent lab results to her Cardiologist - Dr. Stanford Breed. Per pt, fax number is [phone]. Task was completed as requested. Confirmation rec'd.

## 2020-09-04 ENCOUNTER — Telehealth: Payer: Self-pay | Admitting: Osteopathic Medicine

## 2020-09-04 MED ORDER — MONTELUKAST SODIUM 10 MG PO TABS: 10.0000 mg | ORAL_TABLET | Freq: Every day | ORAL | 3 refills | Status: DC

## 2020-09-04 MED ORDER — FLUTICASONE FUROATE-VILANTEROL 100-25 MCG/INH IN AEPB: 1.0000 | INHALATION_SPRAY | Freq: Every day | RESPIRATORY_TRACT | 11 refills | Status: AC

## 2020-09-04 NOTE — Telephone Encounter (Signed)
We've made several referrals for patient and she seems confused about following up w/ these specialists.    Dr Deirdre Pippins,  [facility] [place] [phone], urinary incontinence   Derm - pending  Podiatry - pending  (I just placed orders for the last 2)  Once referral stuff sent for Derm and Podiatry can we generate a letter to send the patient with the contact info for the above 4 specialty office so she can all them and arrange her visits? Thanks.

## 2020-09-07 NOTE — Telephone Encounter (Signed)
I sent a letter to Ms. Autumn Bowen

## 2020-09-09 ENCOUNTER — Telehealth: Payer: Self-pay

## 2020-09-09 NOTE — Telephone Encounter (Signed)
Per provider's request - patient's handicap Placard mailed to address on file. Patient has been informed that form was mailed today. No other inquiries during the call.

## 2020-09-14 ENCOUNTER — Ambulatory Visit: Payer: Medicare HMO | Admitting: Osteopathic Medicine

## 2020-09-15 ENCOUNTER — Other Ambulatory Visit: Payer: Self-pay | Admitting: Osteopathic Medicine

## 2020-09-15 DIAGNOSIS — F331 Major depressive disorder, recurrent, moderate: Secondary | ICD-10-CM

## 2020-09-15 DIAGNOSIS — R109 Unspecified abdominal pain: Secondary | ICD-10-CM

## 2020-10-07 ENCOUNTER — Other Ambulatory Visit: Payer: Self-pay | Admitting: Osteopathic Medicine

## 2020-10-13 ENCOUNTER — Other Ambulatory Visit: Payer: Self-pay | Admitting: Osteopathic Medicine

## 2020-10-13 DIAGNOSIS — F331 Major depressive disorder, recurrent, moderate: Secondary | ICD-10-CM

## 2020-10-23 ENCOUNTER — Encounter: Payer: Self-pay | Admitting: Podiatry

## 2020-10-23 ENCOUNTER — Other Ambulatory Visit: Payer: Self-pay

## 2020-10-23 ENCOUNTER — Other Ambulatory Visit: Payer: Self-pay | Admitting: Osteopathic Medicine

## 2020-10-23 ENCOUNTER — Ambulatory Visit (INDEPENDENT_AMBULATORY_CARE_PROVIDER_SITE_OTHER): Payer: Medicare Other | Admitting: Podiatry

## 2020-10-23 DIAGNOSIS — B351 Tinea unguium: Secondary | ICD-10-CM

## 2020-10-23 DIAGNOSIS — M79675 Pain in left toe(s): Secondary | ICD-10-CM

## 2020-10-23 DIAGNOSIS — E118 Type 2 diabetes mellitus with unspecified complications: Secondary | ICD-10-CM | POA: Diagnosis not present

## 2020-10-23 DIAGNOSIS — Z794 Long term (current) use of insulin: Secondary | ICD-10-CM

## 2020-10-23 DIAGNOSIS — M79674 Pain in right toe(s): Secondary | ICD-10-CM

## 2020-10-23 DIAGNOSIS — Z1231 Encounter for screening mammogram for malignant neoplasm of breast: Secondary | ICD-10-CM

## 2020-10-27 NOTE — Progress Notes (Signed)
Subjective:   Patient ID: Autumn Bowen, female   DOB: 75 y.o.   MRN: [id number]   HPI 75 year old female presents the office today for concerns of thick, elongated toes that she cannot trim her self.  She states that the nails have come off on their own previously.  Denies any open lesions.    Also states that she has bunions and cannot wear shoes because of this.  Last A1c was 6.8 on 09/01/2020 Emeterio Reeve, DO last seen 09/01/2020  Review of Systems  All other systems reviewed and are negative.  Past Medical History:  Diagnosis Date  . Allergy    Notes that grass and some trees causes eyes to sting, burn, and water.  . Anemia   . Celiac disease   . Celiac disease   . COPD (chronic obstructive pulmonary disease) ([place])   . Diabetes mellitus type II   . Diverticulitis   . Dyslipidemia   . HOH (hard of hearing)   . Hypertension   . Hypothyroidism   . Sleep apnea   . Sore throat     Past Surgical History:  Procedure Laterality Date  . APPENDECTOMY    . BREAST SURGERY     h/o benign cystleft breast and lymp node removal on right breast area.  Marland Kitchen EYE SURGERY     Left eye cataract removal, right eye h/o macular degeneration.  . INTRAVASCULAR PRESSURE WIRE/FFR STUDY N/A 12/07/2017   Procedure: INTRAVASCULAR PRESSURE WIRE/FFR STUDY;  Surgeon: Leonie Man, MD;  Location: [facility];  Service: Cardiovascular;  Laterality: N/A;  . LEFT HEART CATH AND CORONARY ANGIOGRAPHY N/A 12/07/2017   Procedure: LEFT HEART CATH AND CORONARY ANGIOGRAPHY;  Surgeon: Leonie Man, MD;  Location: [facility];  Service: Cardiovascular;  Laterality: N/A;  . SPINE SURGERY     Pt not sure of the type of surgery she had but knows it was L4-5.  Marland Kitchen VAGINAL HYSTERECTOMY       Current Outpatient Medications:  .  albuterol (VENTOLIN HFA) 108 (90 Base) MCG/ACT inhaler, Inhale 2 puffs into the lungs every 4 (four) hours as needed for wheezing., Disp: 2 each, Rfl: 5 .  AMBULATORY NON  FORMULARY MEDICATION, Adult Diaper Dx Stress urinary incontinence - size and brand per patient preference and insurance coverage, QS 90 days w/ refill x99, Disp: 100 Units, Rfl: 99 .  AMBULATORY NON FORMULARY MEDICATION, Adult incontinence pads- size and brand per patient preference and insurance coverage., Disp: 100 Units, Rfl: 99 .  amitriptyline (ELAVIL) 50 MG tablet, , Disp: , Rfl:  .  aspirin EC 81 MG tablet, Take 1 tablet (81 mg total) by mouth daily., Disp: 90 tablet, Rfl: 3 .  cholecalciferol (VITAMIN D) 1000 units tablet, Take 1,000 Units by mouth daily., Disp: , Rfl:  .  dicyclomine (BENTYL) 10 MG capsule, TAKE ONE CAPSULE BY MOUTH TWICE DAILY, Disp: 180 capsule, Rfl: 0 .  EPINEPHRINE 0.3 mg/0.3 mL IJ SOAJ injection, USE AS DIRECTED, Disp: 2 each, Rfl: 0 .  famotidine (PEPCID) 20 MG tablet, TAKE ONE TABLET BY MOUTH DAILY, Disp: 90 tablet, Rfl: 0 .  fluticasone furoate-vilanterol (BREO ELLIPTA) 100-25 MCG/INH AEPB, Inhale 1 puff into the lungs daily., Disp: 1 each, Rfl: 11 .  gabapentin (NEURONTIN) 300 MG capsule, TAKE TWO CAPSULES BY MOUTH THREE TIMES DAILY. MAY INCREASE TO THREE CAPSULES THREE TIMES DAILY AS NEEDED, Disp: 180 capsule, Rfl: 0 .  ibuprofen (ADVIL) 800 MG tablet, TAKE 1 TABLET(800 MG) BY  MOUTH EVERY 8 HOURS AS NEEDED FOR PAIN, Disp: 30 tablet, Rfl: 0 .  insulin degludec (TRESIBA FLEXTOUCH) 200 UNIT/ML FlexTouch Pen, Inject 30-60 Units into the skin at bedtime. (Patient taking differently: Inject 28 Units into the skin at bedtime.), Disp: 15 mL, Rfl: 99 .  Insulin Pen Needle (BD PEN NEEDLE NANO 2ND GEN) 32G X 4 MM MISC, 1 Device by Does not apply route as needed (with insulin)., Disp: 100 each, Rfl: 5 .  levothyroxine (SYNTHROID) 175 MCG tablet, TAKE ONE TABLET BY MOUTH DAILY BEFORE BREAKFAST, Disp: 90 tablet, Rfl: 0 .  LUMIGAN 0.01 % SOLN, Instill 1 drop into both eyes every night, Disp: , Rfl: 3 .  meloxicam (MOBIC) 15 MG tablet, TAKE 1 TABLET(15 MG) BY MOUTH DAILY WITH FOOD  (Patient not taking: Reported on 09/01/2020), Disp: 90 tablet, Rfl: 0 .  metFORMIN (GLUCOPHAGE-XR) 750 MG 24 hr tablet, Take two tablets by mouth daily with breakfast., Disp: 180 tablet, Rfl: 1 .  montelukast (SINGULAIR) 10 MG tablet, Take 1 tablet (10 mg total) by mouth at bedtime., Disp: 90 tablet, Rfl: 3 .  Multiple Vitamins-Minerals (PRESERVISION AREDS 2 PO), Take 1 capsule by mouth 2 (two) times daily., Disp: , Rfl:  .  nitroGLYCERIN (NITROSTAT) 0.4 MG SL tablet, Place 1 tablet (0.4 mg total) under the tongue every 5 (five) minutes as needed for chest pain., Disp: 25 tablet, Rfl: 11 .  QUEtiapine (SEROQUEL) 200 MG tablet, TAKE ONE TABLET BY MOUTH AT BEDTIME, Disp: 90 tablet, Rfl: 0 .  rosuvastatin (CRESTOR) 40 MG tablet, TAKE 1 TABLET(40 MG) BY MOUTH DAILY, Disp: 90 tablet, Rfl: 3 .  sertraline (ZOLOFT) 100 MG tablet, TAKE TWO TABLETS BY MOUTH DAILY, Disp: 180 tablet, Rfl: 3 .  traMADol (ULTRAM) 50 MG tablet, TAKE 1 OR 2 TABLETS BY MOUTH EVERY 8 HOURS AS NEEDED FOR moderate pain. Max SIX tabs PER DAY, Disp: 21 tablet, Rfl: 0  Allergies  Allergen Reactions  . Bee Venom Anaphylaxis  . Iodinated Diagnostic Agents Other (See Comments)    Per patient cardiac arrest IVP DYE-CARDIAC ARREST  . Penicillins Anaphylaxis    Has patient had a PCN reaction causing immediate rash, facial/tongue/throat swelling, SOB or lightheadedness with hypotension: yes Has patient had a PCN reaction causing severe rash involving mucus membranes or skin necrosis: no Has patient had a PCN reaction that required hospitalization: yes Has patient had a PCN reaction occurring within the last 10 years: no If all of the above answers are "NO", then may proceed with Cephalosporin use.   . Povidone Iodine Itching and Rash  . Tomato Itching  . Chocolate Rash  . Latex Rash  . Other Rash  . Quetiapine Fumarate Other (See Comments)    Per pt she can not take XR   . Trazodone And Nefazodone Hives  . Zolpidem Tartrate Nausea  And Vomiting  . Gluten Meal   . Zolpidem Tartrate Nausea And Vomiting  . Atorvastatin Calcium Nausea Only  . Esomeprazole Magnesium Nausea Only  . Metformin And Related Diarrhea          Objective:  Physical Exam  General: AAO x3, NAD  Dermatological: Nails are hypertrophic, dystrophic, brittle, discolored, elongated 8. No surrounding redness or drainage. Tenderness nails 2-5 bilaterally. No open lesions or pre-ulcerative lesions are identified today.  Vascular: Dorsalis Pedis artery and Posterior Tibial artery pedal pulses are 2/4 bilateral with immedate capillary fill time.There is no pain with calf compression, swelling, warmth, erythema.   Neruologic: Sensation mildly decreased  with Thornell Mule monofilament. Musculoskeletal: Muscular strength 5/5 in all groups tested bilateral.  Gait: Unassisted, Nonantalgic.       Assessment:   Symptomatic onychomycosis     Plan:  -Treatment options discussed including all alternatives, risks, and complications -Etiology of symptoms were discussed -Nails debrided 8 without complications or bleeding. -Continue offloading for the bunions. -Daily foot inspection -Follow-up in 3 months or sooner if any problems arise. In the meantime, encouraged to call the office with any questions, concerns, change in symptoms.   Celesta Gentile, DPM

## 2020-11-11 ENCOUNTER — Other Ambulatory Visit: Payer: Medicare Other

## 2020-11-11 ENCOUNTER — Ambulatory Visit: Payer: Medicare Other

## 2020-11-17 ENCOUNTER — Telehealth: Payer: Self-pay | Admitting: Cardiology

## 2020-11-17 ENCOUNTER — Other Ambulatory Visit: Payer: Self-pay

## 2020-11-17 ENCOUNTER — Other Ambulatory Visit: Payer: Self-pay | Admitting: Cardiology

## 2020-11-17 DIAGNOSIS — E7800 Pure hypercholesterolemia, unspecified: Secondary | ICD-10-CM

## 2020-11-17 MED ORDER — ROSUVASTATIN CALCIUM 40 MG PO TABS: ORAL_TABLET | ORAL | 0 refills | Status: DC

## 2020-11-17 NOTE — Telephone Encounter (Signed)
RX sent to pharmacy  

## 2020-11-17 NOTE — Telephone Encounter (Signed)
*  STAT* If patient is at the pharmacy, call can be transferred to refill team.   1. Which medications need to be refilled? (please list name of each medication and dose if known)  rosuvastatin (CRESTOR) 40 MG tablet  2. Which pharmacy/location (including street and city if local pharmacy) is medication to be sent to? [place], [place] [address]  3. Do they need a 30 day or 90 day supply? [place]

## 2020-12-04 ENCOUNTER — Other Ambulatory Visit: Payer: Self-pay | Admitting: Osteopathic Medicine

## 2020-12-04 DIAGNOSIS — F331 Major depressive disorder, recurrent, moderate: Secondary | ICD-10-CM

## 2020-12-04 DIAGNOSIS — R109 Unspecified abdominal pain: Secondary | ICD-10-CM

## 2020-12-25 ENCOUNTER — Other Ambulatory Visit: Payer: Self-pay

## 2020-12-25 ENCOUNTER — Ambulatory Visit (INDEPENDENT_AMBULATORY_CARE_PROVIDER_SITE_OTHER): Payer: 59 | Admitting: Podiatry

## 2020-12-25 ENCOUNTER — Encounter: Payer: Self-pay | Admitting: Podiatry

## 2020-12-25 DIAGNOSIS — Z794 Long term (current) use of insulin: Secondary | ICD-10-CM

## 2020-12-25 DIAGNOSIS — M79675 Pain in left toe(s): Secondary | ICD-10-CM

## 2020-12-25 DIAGNOSIS — M7750 Other enthesopathy of unspecified foot: Secondary | ICD-10-CM | POA: Diagnosis not present

## 2020-12-25 DIAGNOSIS — M79674 Pain in right toe(s): Secondary | ICD-10-CM | POA: Diagnosis not present

## 2020-12-25 DIAGNOSIS — E118 Type 2 diabetes mellitus with unspecified complications: Secondary | ICD-10-CM

## 2020-12-25 DIAGNOSIS — B351 Tinea unguium: Secondary | ICD-10-CM | POA: Diagnosis not present

## 2020-12-25 MED ORDER — CICLOPIROX 8 % EX SOLN: Freq: Every day | CUTANEOUS | 2 refills | Status: AC

## 2020-12-25 NOTE — Patient Instructions (Signed)
Diabetes Mellitus and Foot Care Foot care is an important part of your health, especially when you have diabetes. Diabetes may cause you to have problems because of poor blood flow (circulation) to your feet and legs, which can cause your skin to: Become thinner and drier. Break more easily. Heal more slowly. Peel and crack. You may also have nerve damage (neuropathy) in your legs and feet, causing decreased feeling in them. This means that you may not notice minor injuries to your feet that could lead to more serious problems. Noticing and addressing any potential problems early is the best wayto prevent future foot problems. How to care for your feet Foot hygiene  Wash your feet daily with warm water and mild soap. Do not use hot water. Then, pat your feet and the areas between your toes until they are completely dry. Do not soak your feet as this can dry your skin. Trim your toenails straight across. Do not dig under them or around the cuticle. File the edges of your nails with an emery board or nail file. Apply a moisturizing lotion or petroleum jelly to the skin on your feet and to dry, brittle toenails. Use lotion that does not contain alcohol and is unscented. Do not apply lotion between your toes.  Shoes and socks Wear clean socks or stockings every day. Make sure they are not too tight. Do not wear knee-high stockings since they may decrease blood flow to your legs. Wear shoes that fit properly and have enough cushioning. Always look in your shoes before you put them on to be sure there are no objects inside. To break in new shoes, wear them for just a few hours a day. This prevents injuries on your feet. Wounds, scrapes, corns, and calluses  Check your feet daily for blisters, cuts, bruises, sores, and redness. If you cannot see the bottom of your feet, use a mirror or ask someone for help. Do not cut corns or calluses or try to remove them with medicine. If you find a minor scrape,  cut, or break in the skin on your feet, keep it and the skin around it clean and dry. You may clean these areas with mild soap and water. Do not clean the area with peroxide, alcohol, or iodine. If you have a wound, scrape, corn, or callus on your foot, look at it several times a day to make sure it is healing and not infected. Check for: Redness, swelling, or pain. Fluid or blood. Warmth. Pus or a bad smell.  General tips Do not cross your legs. This may decrease blood flow to your feet. Do not use heating pads or hot water bottles on your feet. They may burn your skin. If you have lost feeling in your feet or legs, you may not know this is happening until it is too late. Protect your feet from hot and cold by wearing shoes, such as at the beach or on hot pavement. Schedule a complete foot exam at least once a year (annually) or more often if you have foot problems. Report any cuts, sores, or bruises to your health care provider immediately. Where to find more information American Diabetes Association: www.diabetes.org Association of Diabetes Care & Education Specialists: www.diabeteseducator.org Contact a health care provider if: You have a medical condition that increases your risk of infection and you have any cuts, sores, or bruises on your feet. You have an injury that is not healing. You have redness on your legs or feet.  You feel burning or tingling in your legs or feet. You have pain or cramps in your legs and feet. Your legs or feet are numb. Your feet always feel cold. You have pain around any toenails. Get help right away if: You have a wound, scrape, corn, or callus on your foot and: You have pain, swelling, or redness that gets worse. You have fluid or blood coming from the wound, scrape, corn, or callus. Your wound, scrape, corn, or callus feels warm to the touch. You have pus or a bad smell coming from the wound, scrape, corn, or callus. You have a fever. You have a red  line going up your leg. Summary Check your feet every day for blisters, cuts, bruises, sores, and redness. Apply a moisturizing lotion or petroleum jelly to the skin on your feet and to dry, brittle toenails. Wear shoes that fit properly and have enough cushioning. If you have foot problems, report any cuts, sores, or bruises to your health care provider immediately. Schedule a complete foot exam at least once a year (annually) or more often if you have foot problems. This information is not intended to replace advice given to you by your health care provider. Make sure you discuss any questions you have with your healthcare provider. Document Revised: 12/05/2019 Document Reviewed: 12/05/2019 Elsevier Patient Education  [place]. Ciclopirox nail solution What is this medication? CICLOPIROX (sye kloe PEER ox) NAIL SOLUTION is an antifungal medicine. It usedto treat fungal infections of the nails. This medicine may be used for other purposes; ask your health care provider orpharmacist if you have questions. COMMON BRAND NAME(S): CNL8, Penlac What should I tell my care team before I take this medication? They need to know if you have any of these conditions: diabetes mellitus history of seizures HIV infection immune system problems or organ transplant large areas of burned or damaged skin peripheral vascular disease or poor circulation taking corticosteroid medication (including steroid inhalers, cream, or lotion) an unusual or allergic reaction to ciclopirox, isopropyl alcohol, other medicines, foods, dyes, or preservatives pregnant or trying to get pregnant breast-feeding How should I use this medication? This medicine is for external use only. Follow the directions that come with this medicine exactly. Wash and dry your hands before use. Avoid contact with the eyes, mouth or nose. If you do get this medicine in your eyes, rinse out with plenty of cool tap water. Contact your  doctor or health care professional if eye irritation occurs. Use at regular intervals. Do not use your medicine more often than directed. Finish the full course prescribed by your doctor or health care professional even if you think you are better. Do not stop usingexcept on your doctor's advice. Talk to your pediatrician regarding the use of this medicine in children. While this medicine may be prescribed for children as young as 12 years for selectedconditions, precautions do apply. Overdosage: If you think you have taken too much of this medicine contact apoison control center or emergency room at once. NOTE: This medicine is only for you. Do not share this medicine with others. What if I miss a dose? If you miss a dose, use it as soon as you can. If it is almost time for yournext dose, use only that dose. Do not use double or extra doses. What may interact with this medication? Interactions are not expected. Do not use any other skin products withouttelling your doctor or health care professional. This list may not describe all  possible interactions. Give your health care provider a list of all the medicines, herbs, non-prescription drugs, or dietary supplements you use. Also tell them if you smoke, drink alcohol, or use illegaldrugs. Some items may interact with your medicine. What should I watch for while using this medication? Tell your doctor or health care professional if your symptoms get worse. Four to six months of treatment may be needed for the nail(s) to improve. Somepeople may not achieve a complete cure or clearing of the nails by this time. Tell your doctor or health care professional if you develop sores or blisters that do not heal properly. If your nail infection returns after stopping usingthis product, contact your doctor or health care professional. What side effects may I notice from receiving this medication? Side effects that you should report to your doctor or health care  professionalas soon as possible: allergic reactions like skin rash, itching or hives, swelling of the face, lips, or tongue severe irritation, redness, burning, blistering, peeling, swelling, oozing Side effects that usually do not require medical attention (report to yourdoctor or health care professional if they continue or are bothersome): mild reddening of the skin nail discoloration temporary burning or mild stinging at the site of application This list may not describe all possible side effects. Call your doctor for medical advice about side effects. You may report side effects to FDA at1-800-FDA-1088. Where should I keep my medication? Keep out of the reach of children. Store at room temperature between 15 and 30 degrees C (59 and 86 degrees F). Do not freeze. Protect from light by storing the bottle in the carton after every use. This medicine is flammable. Keep away from heat and flame. Throw away anyunused medicine after the expiration date. NOTE: This sheet is a summary. It may not cover all possible information. If you have questions about this medicine, talk to your doctor, pharmacist, orhealth care provider.  2022 Elsevier/Gold Standard (2007-08-20 16:49:20)

## 2020-12-27 NOTE — Progress Notes (Signed)
Subjective: 75 y.o. returns the office today for painful, elongated, thickened toenails which she  cannot trim herself. Denies any redness or drainage around the nails.  She previously the big toenails removed and she gets discomfort at the tip of the toes on the big nails.  No recent injury.  This is been ongoing for quite some time.  Denies any acute changes since last appointment and no new complaints today. Denies any systemic complaints such as fevers, chills, nausea, vomiting.   PCP: Emeterio Reeve, DO  A1c: 6.8 on September 01, 2020  Objective: AAO 3, NAD DP/PT pulses palpable, CRT less than 3 seconds Nails hypertrophic, dystrophic, elongated, brittle, discolored 8. There is tenderness overlying the nails 1-5 bilaterally. There is no surrounding erythema or drainage along the nail sites. No nails present on bilateral hallux and distal aspect the toe dorsally is dorsal spurring present.  Patient will cause discomfort as well as noticed erythema at the end of the day.  No skin breakdown today. No pain with calf compression, swelling, warmth, erythema.  Assessment: Patient presents with symptomatic onychomycosis; bone spur hallux  Plan: -Treatment options including alternatives, risks, complications were discussed -Nails sharply debrided 8 without complication/bleeding. -I discussed with her given the bone spurs behind the nail there is likely causing pressure inside shoes.  Discussed with her she modifications and dispensed offloading pads as well. -Discussed daily foot inspection. If there are any changes, to call the office immediately.  -Follow-up in 3 months or sooner if any problems are to arise. In the meantime, encouraged to call the office with any questions, concerns, changes symptoms.  Celesta Gentile, DPM

## 2021-01-01 NOTE — Progress Notes (Signed)
HPI: Follow-up coronary artery disease.  Previously followed by Dr. [name].  Abdominal CT November 2017 showed atherosclerosis but no aneurysm.  Patient had cardiac catheterization July 2019 due to CP.  There was a 60% mid right coronary artery lesion with FFR of 0.94.  No other coronary disease noted.  Carotid Dopplers January 2020 showed 1 to 39% right and near normal left. Since last seen, she has some dyspnea on exertion unchanged.  No orthopnea, PND, pedal edema, chest pain or syncope.  Current Outpatient Medications  Medication Sig Dispense Refill   albuterol (VENTOLIN HFA) 108 (90 Base) MCG/ACT inhaler Inhale 2 puffs into the lungs every 4 (four) hours as needed for wheezing. 2 each 5   AMBULATORY NON FORMULARY MEDICATION Adult Diaper Dx Stress urinary incontinence - size and brand per patient preference and insurance coverage, QS 90 days w/ refill x99 100 Units 99   AMBULATORY NON FORMULARY MEDICATION Adult incontinence pads- size and brand per patient preference and insurance coverage. 100 Units 99   amitriptyline (ELAVIL) 50 MG tablet      aspirin EC 81 MG tablet Take 1 tablet (81 mg total) by mouth daily. 90 tablet 3   cholecalciferol (VITAMIN D) 1000 units tablet Take 1,000 Units by mouth daily.     ciclopirox (PENLAC) 8 % solution Apply topically at bedtime. Apply over nail and surrounding skin. Apply daily over previous coat. After seven (7) days, may remove with alcohol and continue cycle. 6.6 mL 2   dicyclomine (BENTYL) 10 MG capsule TAKE ONE CAPSULE BY MOUTH TWICE DAILY 180 capsule 0   EPINEPHRINE 0.3 mg/0.3 mL IJ SOAJ injection USE AS DIRECTED 2 each 0   famotidine (PEPCID) 20 MG tablet TAKE ONE TABLET BY MOUTH DAILY 90 tablet 0   fluticasone furoate-vilanterol (BREO ELLIPTA) 100-25 MCG/INH AEPB Inhale 1 puff into the lungs daily. 1 each 11   gabapentin (NEURONTIN) 300 MG capsule TAKE TWO CAPSULES BY MOUTH THREE TIMES DAILY. MAY INCREASE TO THREE CAPSULES THREE TIMES  DAILY AS NEEDED 180 capsule 0   ibuprofen (ADVIL) 800 MG tablet TAKE 1 TABLET(800 MG) BY MOUTH EVERY 8 HOURS AS NEEDED FOR PAIN 30 tablet 0   insulin degludec (TRESIBA FLEXTOUCH) 200 UNIT/ML FlexTouch Pen Inject 30-60 Units into the skin at bedtime. (Patient taking differently: Inject 28 Units into the skin at bedtime.) 15 mL 99   Insulin Pen Needle (BD PEN NEEDLE NANO 2ND GEN) 32G X 4 MM MISC 1 Device by Does not apply route as needed (with insulin). 100 each 5   levothyroxine (SYNTHROID) 175 MCG tablet TAKE ONE TABLET BY MOUTH DAILY BEFORE BREAKFAST 90 tablet 0   LUMIGAN 0.01 % SOLN Instill 1 drop into both eyes every night  3   meloxicam (MOBIC) 15 MG tablet TAKE 1 TABLET(15 MG) BY MOUTH DAILY WITH FOOD 90 tablet 0   metFORMIN (GLUCOPHAGE-XR) 750 MG 24 hr tablet Take two tablets by mouth daily with breakfast. 180 tablet 1   montelukast (SINGULAIR) 10 MG tablet Take 1 tablet (10 mg total) by mouth at bedtime. 90 tablet 3   Multiple Vitamins-Minerals (PRESERVISION AREDS 2 PO) Take 1 capsule by mouth 2 (two) times daily.     QUEtiapine (SEROQUEL) 200 MG tablet TAKE ONE TABLET BY MOUTH AT BEDTIME 90 tablet 0   rosuvastatin (CRESTOR) 40 MG tablet TAKE ONE TABLET BY MOUTH DAILY 90 tablet 0   sertraline (ZOLOFT) 100 MG tablet TAKE TWO TABLETS BY MOUTH DAILY 180 tablet 3  solifenacin (VESICARE) 5 MG tablet Take 5 mg by mouth daily.     traMADol (ULTRAM) 50 MG tablet TAKE 1 OR 2 TABLETS BY MOUTH EVERY 8 HOURS AS NEEDED FOR moderate pain. Max SIX tabs PER DAY 21 tablet 0   nitroGLYCERIN (NITROSTAT) 0.4 MG SL tablet Place 1 tablet (0.4 mg total) under the tongue every 5 (five) minutes as needed for chest pain. 25 tablet 11   No current facility-administered medications for this visit.     Past Medical History:  Diagnosis Date   Allergy    Notes that grass and some trees causes eyes to sting, burn, and water.   Anemia    Celiac disease    Celiac disease    COPD (chronic obstructive pulmonary  disease) (HCC)    Diabetes mellitus type II    Diverticulitis    Dyslipidemia    HOH (hard of hearing)    Hypertension    Hypothyroidism    Sleep apnea    Sore throat     Past Surgical History:  Procedure Laterality Date   APPENDECTOMY     BREAST SURGERY     h/o benign cystleft breast and lymp node removal on right breast area.   EYE SURGERY     Left eye cataract removal, right eye h/o macular degeneration.   INTRAVASCULAR PRESSURE WIRE/FFR STUDY N/A 12/07/2017   Procedure: INTRAVASCULAR PRESSURE WIRE/FFR STUDY;  Surgeon: Leonie Man, MD;  Location: [facility];  Service: Cardiovascular;  Laterality: N/A;   LEFT HEART CATH AND CORONARY ANGIOGRAPHY N/A 12/07/2017   Procedure: LEFT HEART CATH AND CORONARY ANGIOGRAPHY;  Surgeon: Leonie Man, MD;  Location: [facility];  Service: Cardiovascular;  Laterality: N/A;   SPINE SURGERY     Pt not sure of the type of surgery she had but knows it was L4-5.   VAGINAL HYSTERECTOMY      Social History   Socioeconomic History   Marital status: Divorced    Spouse name: Not on file   Number of children: Not on file   Years of education: Not on file   Highest education level: Not on file  Occupational History   Not on file  Tobacco Use   Smoking status: Some Days    Packs/day: 0.25    Years: 51.00    Pack years: 12.75    Types: Cigarettes    Start date: 01/02/2012   Smokeless tobacco: Never   Tobacco comments:    Been down to 4 - 6 cigs a day  Vaping Use   Vaping Use: Never used  Substance and Sexual Activity   Alcohol use: No   Drug use: No   Sexual activity: Not Currently  Other Topics Concern   Not on file  Social History Narrative   Not on file   Social Determinants of Health   Financial Resource Strain: Not on file  Food Insecurity: Not on file  Transportation Needs: Not on file  Physical Activity: Not on file  Stress: Not on file  Social Connections: Not on file  Intimate Partner Violence: Not  on file    Family History  Problem Relation Age of Onset   Anxiety disorder Mother    Depression Mother    Heart attack Mother    Stroke Mother    Diabetes type II Mother    Diabetes Father    Glaucoma Father    Hypertension Father    COPD Father    COPD Sister  Hashimoto's thyroiditis Sister    Cancer Sister    Kidney disease Sister    Drug abuse Brother    Alcohol abuse Brother    Asthma Son    Diabetes Son     ROS: no fevers or chills, productive cough, hemoptysis, dysphasia, odynophagia, melena, hematochezia, dysuria, hematuria, rash, seizure activity, orthopnea, PND, pedal edema, claudication. Remaining systems are negative.  Physical Exam: Well-developed well-nourished in no acute distress.  Skin is warm and dry.  HEENT is normal.  Neck is supple.  Chest is clear to auscultation with normal expansion.  Cardiovascular exam is regular rate and rhythm.  Abdominal exam nontender or distended. No masses palpated. Extremities show no edema. neuro grossly intact   A/P  1 coronary artery disease-patient denies new chest pain.  Continue medical therapy with aspirin and statin.  2 hypertension-patient's blood pressure is controlled.  3 hyperlipidemia-continue statin.  4 carotid artery disease-mild on most recent carotid Dopplers.  5 tobacco abuse-patient counseled on discontinuing.  Kirk Ruths, MD

## 2021-01-04 ENCOUNTER — Ambulatory Visit: Payer: Medicare Other | Admitting: Osteopathic Medicine

## 2021-01-06 ENCOUNTER — Encounter: Payer: Self-pay | Admitting: Cardiology

## 2021-01-06 ENCOUNTER — Other Ambulatory Visit: Payer: Self-pay

## 2021-01-06 ENCOUNTER — Ambulatory Visit (INDEPENDENT_AMBULATORY_CARE_PROVIDER_SITE_OTHER): Payer: 59 | Admitting: Cardiology

## 2021-01-06 VITALS — BP 120/78 | HR 100 | Ht 63.0 in | Wt 189.1 lb

## 2021-01-06 DIAGNOSIS — I2510 Atherosclerotic heart disease of native coronary artery without angina pectoris: Secondary | ICD-10-CM | POA: Diagnosis not present

## 2021-01-06 DIAGNOSIS — Z720 Tobacco use: Secondary | ICD-10-CM

## 2021-01-06 DIAGNOSIS — I10 Essential (primary) hypertension: Secondary | ICD-10-CM

## 2021-01-06 DIAGNOSIS — E7800 Pure hypercholesterolemia, unspecified: Secondary | ICD-10-CM

## 2021-01-06 NOTE — Patient Instructions (Signed)

## 2021-01-08 ENCOUNTER — Other Ambulatory Visit: Payer: Self-pay | Admitting: Osteopathic Medicine

## 2021-01-19 IMAGING — DX DG HIP (WITH OR WITHOUT PELVIS) 2-3V LEFT
3 series · 3 of 3 positions shown · non-contrast
Comparison: [DATE]

CLINICAL DATA: Pt with Lt hip pain since fall on [REDACTED].

EXAM:
DG HIP (WITH OR WITHOUT PELVIS) 2-3V LEFT

[pelvis ap]
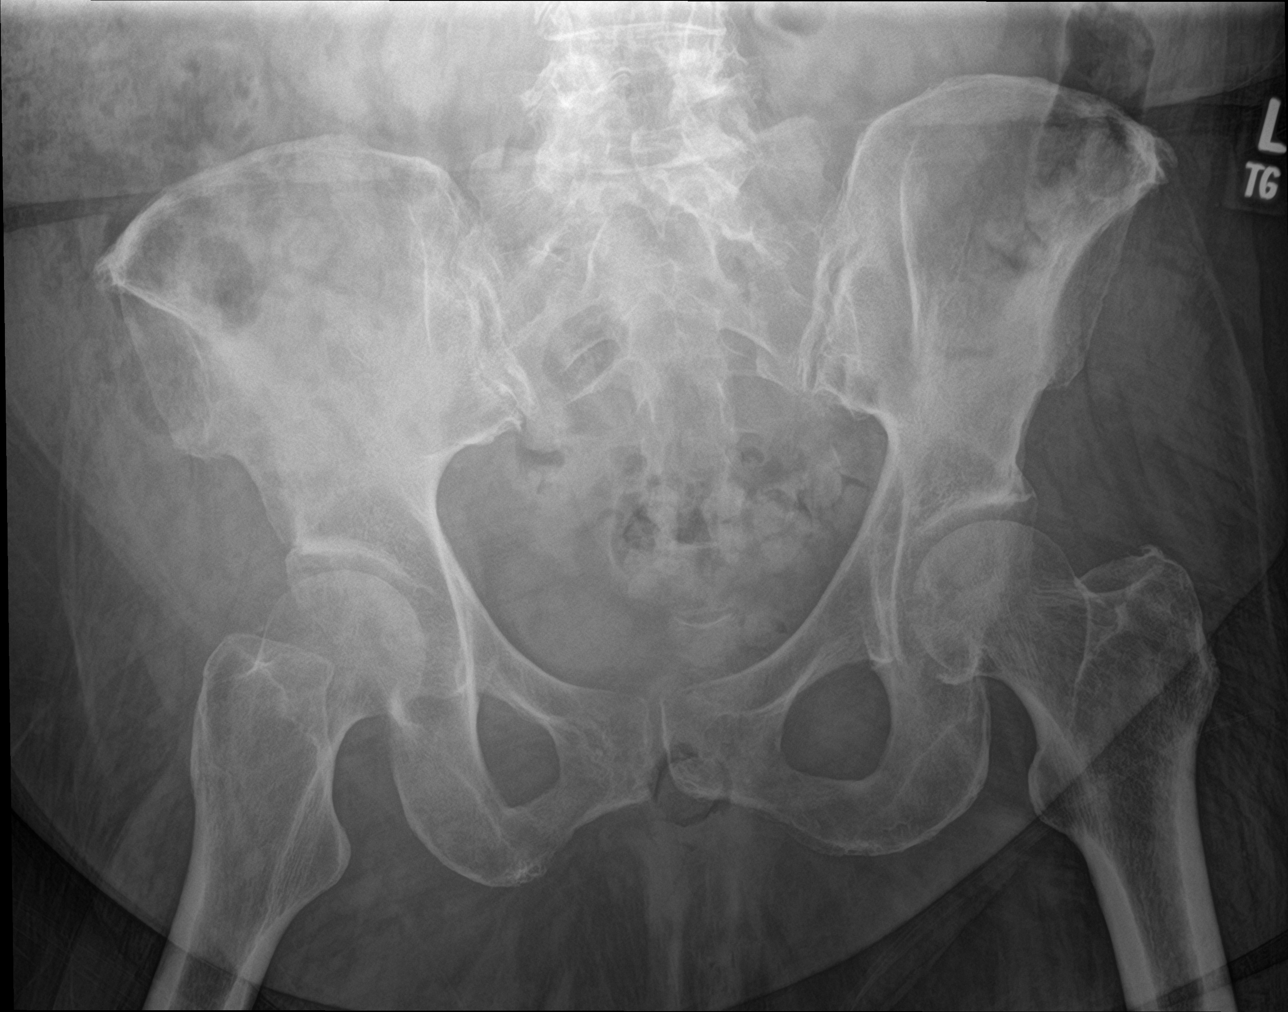

[hip ap]
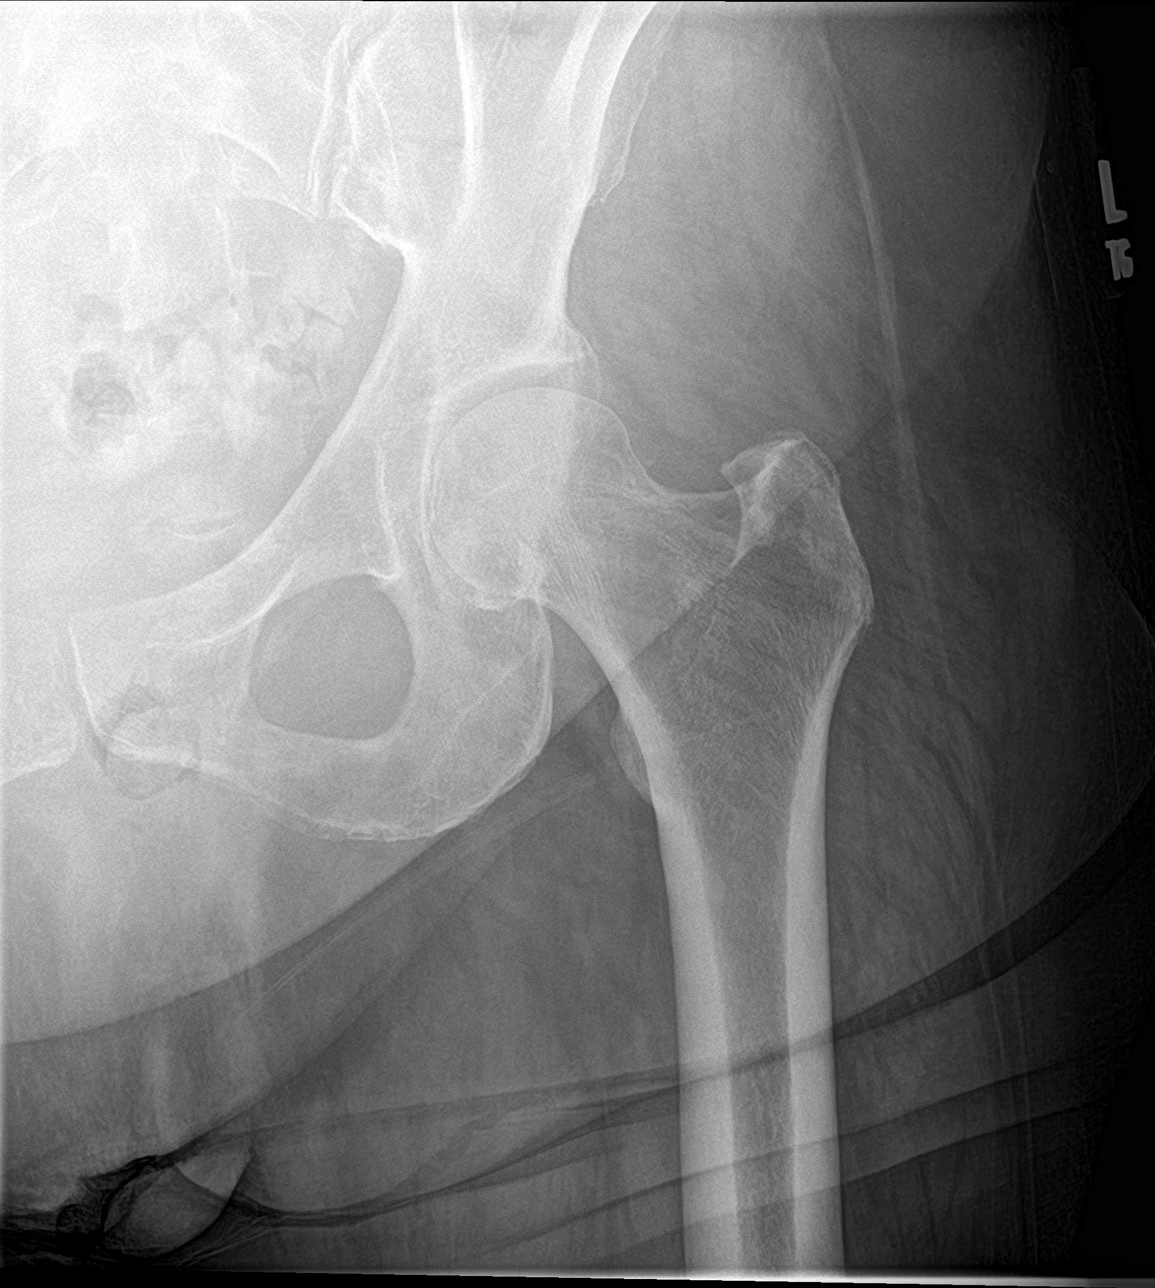

[hip lat]
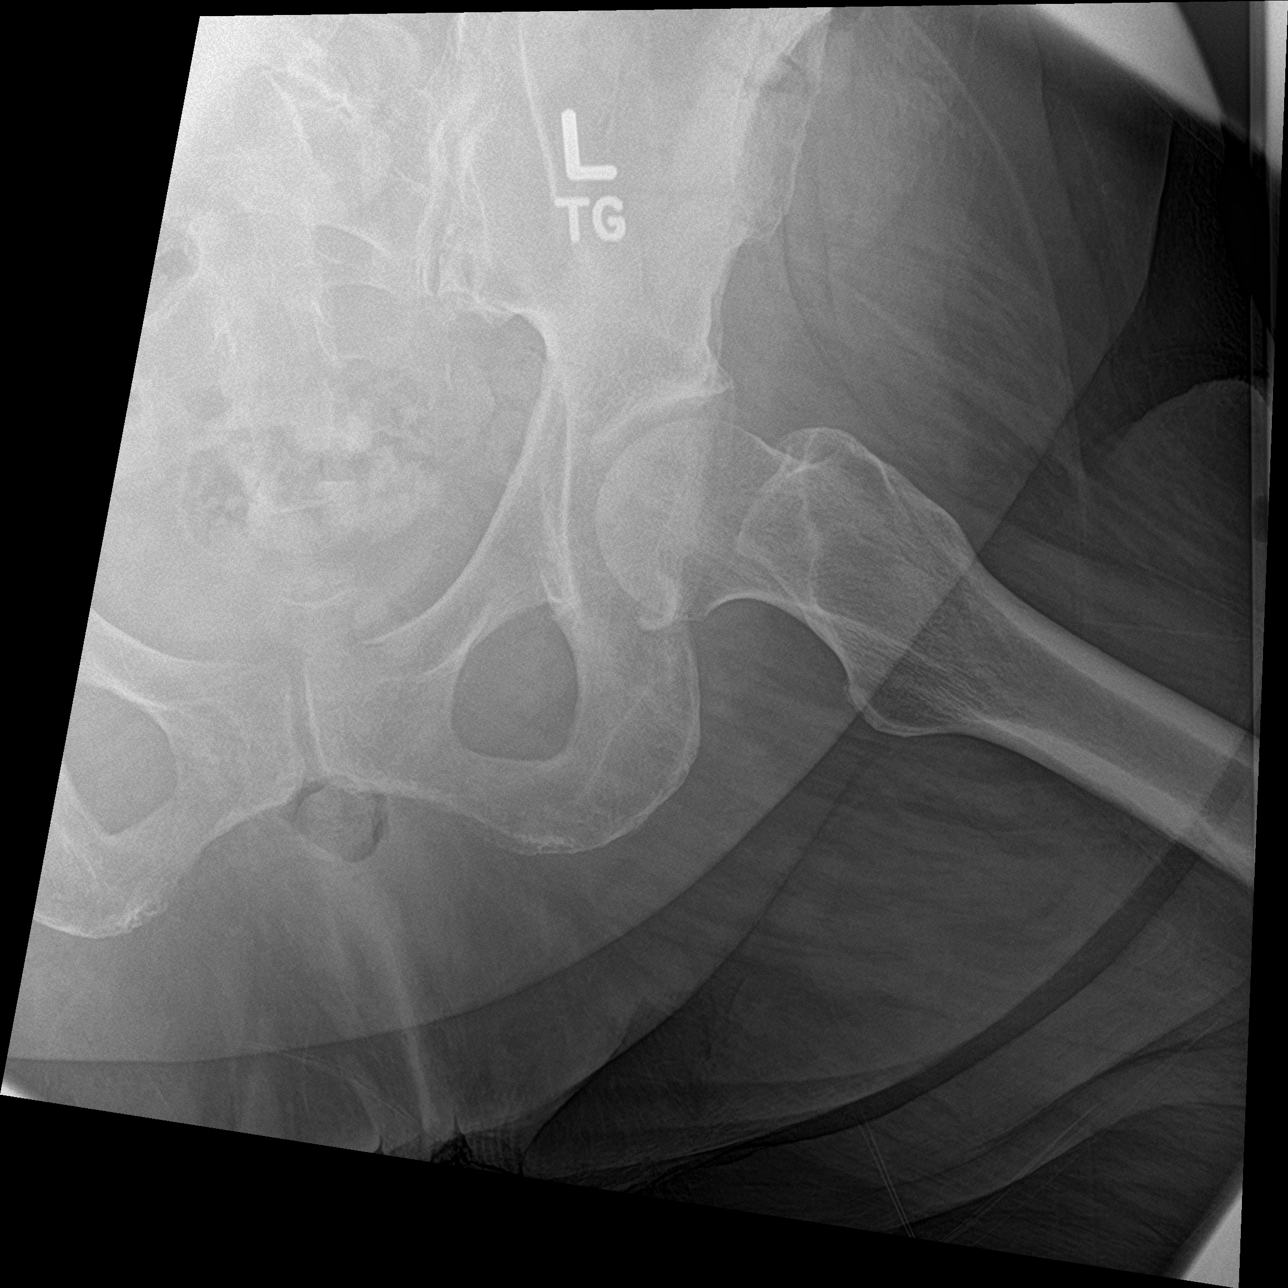

[3 of 3 positions shown; findings below may reference images not displayed]

FINDINGS: There is no evidence of hip fracture or dislocation. There is no
evidence of arthropathy or other focal bone abnormality. Aortoiliac
atheromatous calcifications. Spondylitic changes in the lower lumbar
spine.
IMPRESSION: Negative left hip

## 2021-02-10 ENCOUNTER — Other Ambulatory Visit: Payer: Self-pay

## 2021-02-10 DIAGNOSIS — E118 Type 2 diabetes mellitus with unspecified complications: Secondary | ICD-10-CM

## 2021-02-10 DIAGNOSIS — Z794 Long term (current) use of insulin: Secondary | ICD-10-CM

## 2021-02-10 MED ORDER — TRESIBA FLEXTOUCH 200 UNIT/ML ~~LOC~~ SOPN: 30.0000 [IU] | PEN_INJECTOR | Freq: Every day | SUBCUTANEOUS | 3 refills | Status: AC

## 2021-03-05 ENCOUNTER — Ambulatory Visit (INDEPENDENT_AMBULATORY_CARE_PROVIDER_SITE_OTHER): Payer: 59 | Admitting: Podiatry

## 2021-03-05 ENCOUNTER — Other Ambulatory Visit: Payer: Self-pay

## 2021-03-05 ENCOUNTER — Encounter: Payer: Self-pay | Admitting: Podiatry

## 2021-03-05 DIAGNOSIS — M21611 Bunion of right foot: Secondary | ICD-10-CM | POA: Diagnosis not present

## 2021-03-05 DIAGNOSIS — M79674 Pain in right toe(s): Secondary | ICD-10-CM

## 2021-03-05 DIAGNOSIS — E118 Type 2 diabetes mellitus with unspecified complications: Secondary | ICD-10-CM

## 2021-03-05 DIAGNOSIS — Z794 Long term (current) use of insulin: Secondary | ICD-10-CM

## 2021-03-05 DIAGNOSIS — M21612 Bunion of left foot: Secondary | ICD-10-CM

## 2021-03-05 DIAGNOSIS — B351 Tinea unguium: Secondary | ICD-10-CM | POA: Diagnosis not present

## 2021-03-05 DIAGNOSIS — M79675 Pain in left toe(s): Secondary | ICD-10-CM

## 2021-03-05 NOTE — Progress Notes (Signed)
Subjective: 75 y.o. returns the office today for painful, elongated, thickened toenails which she  cannot trim herself. Denies any redness or drainage around the nails.  Relates tenderness around her bunions and states that she has tried felt before around them but she had a reaction to the material. Hoping for some advice today on offloading these areas. Denies any systemic complaints such as fevers, chills, nausea, vomiting.   PCP: Emeterio Reeve, DO  A1c: 6.8 on September 01, 2020  Objective: AAO 3, NAD DP/PT pulses palpable, CRT less than 3 seconds Nails hypertrophic, dystrophic, elongated, brittle, discolored 8. There is tenderness overlying the nails 1-5 bilaterally. There is no surrounding erythema or drainage along the nail sites. No nails present on bilateral hallux and distal aspect the toe dorsally is dorsal spurring present.  Patient will cause discomfort as well as noticed erythema at the end of the day.  No skin breakdown today. Hallux abducto valgus deformity bilateral with tenderness to medial eminence.  No pain with calf compression, swelling, warmth, erythema.  Assessment: Patient presents with symptomatic onychomycosis; bone spur hallux  Plan: -Treatment options including alternatives, risks, complications were discussed -Nails sharply debrided 8 without complication/bleeding. -Discussed bunion deformities and provided with some alternative padding for these areas.  -Discussed daily foot inspection. If there are any changes, to call the office immediately.  -Follow-up in 3 months or sooner if any problems are to arise. In the meantime, encouraged to call the office with any questions, concerns, changes symptoms.  Lorenda Peck, DPM

## 2021-03-17 ENCOUNTER — Other Ambulatory Visit: Payer: Self-pay | Admitting: Osteopathic Medicine

## 2021-03-17 DIAGNOSIS — F331 Major depressive disorder, recurrent, moderate: Secondary | ICD-10-CM

## 2021-03-27 ENCOUNTER — Other Ambulatory Visit: Payer: Self-pay | Admitting: Osteopathic Medicine

## 2021-03-27 DIAGNOSIS — R109 Unspecified abdominal pain: Secondary | ICD-10-CM

## 2021-03-29 ENCOUNTER — Other Ambulatory Visit: Payer: Self-pay | Admitting: Osteopathic Medicine

## 2021-03-29 DIAGNOSIS — Z794 Long term (current) use of insulin: Secondary | ICD-10-CM

## 2021-03-29 DIAGNOSIS — N8110 Cystocele, unspecified: Secondary | ICD-10-CM | POA: Insufficient documentation

## 2021-03-29 DIAGNOSIS — E118 Type 2 diabetes mellitus with unspecified complications: Secondary | ICD-10-CM

## 2021-06-10 ENCOUNTER — Ambulatory Visit: Payer: 59 | Admitting: Podiatry

## 2021-08-24 ENCOUNTER — Other Ambulatory Visit: Payer: Self-pay | Admitting: Osteopathic Medicine

## 2021-08-24 ENCOUNTER — Other Ambulatory Visit: Payer: Self-pay | Admitting: Cardiology

## 2021-08-24 DIAGNOSIS — E7800 Pure hypercholesterolemia, unspecified: Secondary | ICD-10-CM

## 2021-09-10 ENCOUNTER — Other Ambulatory Visit: Payer: Self-pay | Admitting: Osteopathic Medicine

## 2021-09-15 ENCOUNTER — Other Ambulatory Visit: Payer: Self-pay | Admitting: Osteopathic Medicine

## 2021-10-26 ENCOUNTER — Other Ambulatory Visit: Payer: Self-pay | Admitting: Osteopathic Medicine

## 2021-10-26 DIAGNOSIS — F331 Major depressive disorder, recurrent, moderate: Secondary | ICD-10-CM

## 2021-10-28 ENCOUNTER — Other Ambulatory Visit: Payer: Self-pay | Admitting: Nurse Practitioner

## 2021-10-28 DIAGNOSIS — Z794 Long term (current) use of insulin: Secondary | ICD-10-CM

## 2021-11-09 DIAGNOSIS — B372 Candidiasis of skin and nail: Secondary | ICD-10-CM | POA: Insufficient documentation

## 2021-11-11 ENCOUNTER — Ambulatory Visit: Payer: Medicare Other | Admitting: Podiatry

## 2021-11-11 ENCOUNTER — Other Ambulatory Visit: Payer: Self-pay | Admitting: Osteopathic Medicine

## 2021-11-11 DIAGNOSIS — F331 Major depressive disorder, recurrent, moderate: Secondary | ICD-10-CM

## 2021-11-12 ENCOUNTER — Encounter: Payer: Self-pay | Admitting: Podiatry

## 2021-11-12 ENCOUNTER — Ambulatory Visit (INDEPENDENT_AMBULATORY_CARE_PROVIDER_SITE_OTHER): Payer: Medicare Other | Admitting: Podiatry

## 2021-11-12 DIAGNOSIS — M79675 Pain in left toe(s): Secondary | ICD-10-CM | POA: Diagnosis not present

## 2021-11-12 DIAGNOSIS — M79674 Pain in right toe(s): Secondary | ICD-10-CM

## 2021-11-12 DIAGNOSIS — E118 Type 2 diabetes mellitus with unspecified complications: Secondary | ICD-10-CM | POA: Diagnosis not present

## 2021-11-12 DIAGNOSIS — B351 Tinea unguium: Secondary | ICD-10-CM

## 2021-11-12 DIAGNOSIS — Z794 Long term (current) use of insulin: Secondary | ICD-10-CM

## 2021-11-12 NOTE — Progress Notes (Signed)
Subjective: 76 y.o. returns the office today for painful, elongated, thickened toenails which she  cannot trim herself. Denies any redness or drainage around the nails.  Denies any systemic complaints such as fevers, chills, nausea, vomiting.   PCP: Demetrius Revel Last seen 11/09/21  A1c: 8.1 on 08/27/21  Objective: AAO 3, NAD DP/PT pulses palpable, CRT less than 3 seconds Nails hypertrophic, dystrophic, elongated, brittle, discolored 8. There is tenderness overlying the nails 1-5 bilaterally. There is no surrounding erythema or drainage along the nail sites. No nails present on bilateral hallux and distal aspect the toe dorsally is dorsal spurring present.  Patient will cause discomfort as well as noticed erythema at the end of the day.  No skin breakdown today. Hallux abducto valgus deformity bilateral with tenderness to medial eminence.  No pain with calf compression, swelling, warmth, erythema.  Assessment: Patient presents with symptomatic onychomycosis; bone spur hallux  Plan: -Treatment options including alternatives, risks, complications were discussed -Nails sharply debrided 8 without complication/bleeding. -Discussed daily foot inspection. If there are any changes, to call the office immediately.  -Follow-up in 3 months or sooner if any problems are to arise. In the meantime, encouraged to call the office with any questions, concerns, changes symptoms.  Lorenda Peck, DPM

## 2021-12-15 ENCOUNTER — Other Ambulatory Visit: Payer: Self-pay | Admitting: Cardiology

## 2021-12-15 DIAGNOSIS — E7800 Pure hypercholesterolemia, unspecified: Secondary | ICD-10-CM

## 2022-01-06 NOTE — Progress Notes (Signed)
HPI: Follow-up coronary artery disease.  Previously followed by Dr. [name].  Abdominal CT November 2017 showed atherosclerosis but no aneurysm.  Patient had cardiac catheterization July 2019 due to CP.  There was a 60% mid right coronary artery lesion with FFR of 0.94.  No other coronary disease noted.  Carotid Dopplers January 2020 showed 1 to 39% right and near normal left. Since last seen, patient states she has "very infrequent" chest pain unchanged compared to previous.  She has some dyspnea on exertion also unchanged.  No orthopnea, PND or pedal edema.  No syncope.  Current Outpatient Medications  Medication Sig Dispense Refill   albuterol (VENTOLIN HFA) 108 (90 Base) MCG/ACT inhaler Inhale 2 puffs into the lungs every 4 (four) hours as needed for wheezing. 2 each 5   AMBULATORY NON FORMULARY MEDICATION Adult Diaper Dx Stress urinary incontinence - size and brand per patient preference and insurance coverage, QS 90 days w/ refill x99 100 Units 99   AMBULATORY NON FORMULARY MEDICATION Adult incontinence pads- size and brand per patient preference and insurance coverage. 100 Units 99   amitriptyline (ELAVIL) 50 MG tablet      aspirin EC 81 MG tablet Take 1 tablet (81 mg total) by mouth daily. 90 tablet 3   cholecalciferol (VITAMIN D) 1000 units tablet Take 1,000 Units by mouth daily.     ciclopirox (PENLAC) 8 % solution Apply topically at bedtime. Apply over nail and surrounding skin. Apply daily over previous coat. After seven (7) days, may remove with alcohol and continue cycle. 6.6 mL 2   dicyclomine (BENTYL) 10 MG capsule TAKE ONE CAPSULE BY MOUTH TWICE DAILY 180 capsule 0   EPINEPHRINE 0.3 mg/0.3 mL IJ SOAJ injection USE AS DIRECTED 2 each 0   famotidine (PEPCID) 20 MG tablet TAKE ONE TABLET BY MOUTH DAILY. NO REFILLS. NEEDS TO TRANSITION CARE TO NEW PCP. 30 tablet 0   fluticasone furoate-vilanterol (BREO ELLIPTA) 100-25 MCG/INH AEPB Inhale 1 puff into the lungs daily. 1 each 11    gabapentin (NEURONTIN) 300 MG capsule TAKE TWO CAPSULES BY MOUTH THREE TIMES DAILY. MAY INCREASE TO THREE CAPSULES THREE TIMES DAILY AS NEEDED 180 capsule 0   ibuprofen (ADVIL) 800 MG tablet TAKE 1 TABLET(800 MG) BY MOUTH EVERY 8 HOURS AS NEEDED FOR PAIN 30 tablet 0   insulin degludec (TRESIBA FLEXTOUCH) 200 UNIT/ML FlexTouch Pen Inject 30-60 Units into the skin at bedtime. 15 mL 3   levothyroxine (SYNTHROID) 175 MCG tablet TAKE ONE TABLET BY MOUTH DAILY BEFORE BREAKFAST 90 tablet 0   LUMIGAN 0.01 % SOLN Instill 1 drop into both eyes every night  3   meloxicam (MOBIC) 15 MG tablet TAKE 1 TABLET(15 MG) BY MOUTH DAILY WITH FOOD 90 tablet 0   metFORMIN (GLUCOPHAGE-XR) 750 MG 24 hr tablet Take two tablets by mouth daily with breakfast. 180 tablet 0   montelukast (SINGULAIR) 10 MG tablet Take 1 tablet (10 mg total) by mouth at bedtime. LAST REFILL. NEEDS AN APPT W/ NEW PCP & LABS FOR FURTHER REFILLS. 30 tablet 0   Multiple Vitamins-Minerals (PRESERVISION AREDS 2 PO) Take 1 capsule by mouth 2 (two) times daily.     nitroGLYCERIN (NITROSTAT) 0.4 MG SL tablet Place 1 tablet (0.4 mg total) under the tongue every 5 (five) minutes as needed for chest pain. 25 tablet 11   QUEtiapine (SEROQUEL) 200 MG tablet Take 1 tablet (200 mg total) by mouth at bedtime. NO REFILLS. NEEDS TO TRANSITION CARE TO NEW  PCP. 30 tablet 0   rosuvastatin (CRESTOR) 40 MG tablet TAKE ONE TABLET BY MOUTH DAILY 90 tablet 0   sertraline (ZOLOFT) 100 MG tablet TAKE TWO TABLETS BY MOUTH DAILY 180 tablet 3   solifenacin (VESICARE) 5 MG tablet Take 5 mg by mouth daily.     traMADol (ULTRAM) 50 MG tablet TAKE 1 OR 2 TABLETS BY MOUTH EVERY 8 HOURS AS NEEDED FOR moderate pain. Max SIX tabs PER DAY 21 tablet 0   TRUEPLUS PEN NEEDLES 32G X 4 MM MISC USE AS NEEDED (WITH INSULIN) 100 each 5   No current facility-administered medications for this visit.     Past Medical History:  Diagnosis Date   Allergy    Notes that grass and some trees  causes eyes to sting, burn, and water.   Anemia    Celiac disease    Celiac disease    COPD (chronic obstructive pulmonary disease) (HCC)    Diabetes mellitus type II    Diverticulitis    Dyslipidemia    HOH (hard of hearing)    Hypertension    Hypothyroidism    Sleep apnea    Sore throat     Past Surgical History:  Procedure Laterality Date   APPENDECTOMY     BREAST SURGERY     h/o benign cystleft breast and lymp node removal on right breast area.   EYE SURGERY     Left eye cataract removal, right eye h/o macular degeneration.   INTRAVASCULAR PRESSURE WIRE/FFR STUDY N/A 12/07/2017   Procedure: INTRAVASCULAR PRESSURE WIRE/FFR STUDY;  Surgeon: Leonie Man, MD;  Location: [facility];  Service: Cardiovascular;  Laterality: N/A;   LEFT HEART CATH AND CORONARY ANGIOGRAPHY N/A 12/07/2017   Procedure: LEFT HEART CATH AND CORONARY ANGIOGRAPHY;  Surgeon: Leonie Man, MD;  Location: [facility];  Service: Cardiovascular;  Laterality: N/A;   SPINE SURGERY     Pt not sure of the type of surgery she had but knows it was L4-5.   VAGINAL HYSTERECTOMY      Social History   Socioeconomic History   Marital status: Divorced    Spouse name: Not on file   Number of children: Not on file   Years of education: Not on file   Highest education level: Not on file  Occupational History   Not on file  Tobacco Use   Smoking status: Some Days    Packs/day: 0.25    Years: 51.00    Total pack years: 12.75    Types: Cigarettes    Start date: 01/02/2012   Smokeless tobacco: Never   Tobacco comments:    Been down to 4 - 6 cigs a day  Vaping Use   Vaping Use: Never used  Substance and Sexual Activity   Alcohol use: No   Drug use: No   Sexual activity: Not Currently  Other Topics Concern   Not on file  Social History Narrative   Not on file   Social Determinants of Health   Financial Resource Strain: Not on file  Food Insecurity: Not on file  Transportation Needs:  Not on file  Physical Activity: Not on file  Stress: Not on file  Social Connections: Not on file  Intimate Partner Violence: Not on file    Family History  Problem Relation Age of Onset   Anxiety disorder Mother    Depression Mother    Heart attack Mother    Stroke Mother    Diabetes type  II Mother    Diabetes Father    Glaucoma Father    Hypertension Father    COPD Father    COPD Sister    Hashimoto's thyroiditis Sister    Cancer Sister    Kidney disease Sister    Drug abuse Brother    Alcohol abuse Brother    Asthma Son    Diabetes Son     ROS: Pain in right wrist from recent wasp sting but no fevers or chills, productive cough, hemoptysis, dysphasia, odynophagia, melena, hematochezia, dysuria, hematuria, rash, seizure activity, orthopnea, PND, pedal edema, claudication. Remaining systems are negative.  Physical Exam: Well-developed well-nourished in no acute distress.  Skin is warm and dry.  HEENT is normal.  Neck is supple.  Chest is clear to auscultation with normal expansion.  Cardiovascular exam is regular rate and rhythm.  Abdominal exam nontender or distended. No masses palpated. Extremities show no edema. neuro grossly intact  ECG-normal sinus rhythm at a rate of 85, incomplete right bundle branch block, lateral T wave inversion.  Personally reviewed  A/P  1 CAD-no CP; continue ASA and statin.  2 HTN-BP controlled.  3 hyperlipidemia-continue statin.  Check lipids and liver.  4 Carotid artery disease-mild on most recent dopplers.  5 tobacco abuse-pt counseled on discontinuing.   Kirk Ruths, MD

## 2022-01-19 ENCOUNTER — Encounter: Payer: Self-pay | Admitting: Cardiology

## 2022-01-19 ENCOUNTER — Ambulatory Visit (INDEPENDENT_AMBULATORY_CARE_PROVIDER_SITE_OTHER): Payer: Medicare Other | Admitting: Cardiology

## 2022-01-19 VITALS — BP 128/88 | HR 88 | Ht 63.0 in | Wt 191.0 lb

## 2022-01-19 DIAGNOSIS — Z720 Tobacco use: Secondary | ICD-10-CM

## 2022-01-19 DIAGNOSIS — I2510 Atherosclerotic heart disease of native coronary artery without angina pectoris: Secondary | ICD-10-CM

## 2022-01-19 DIAGNOSIS — E7800 Pure hypercholesterolemia, unspecified: Secondary | ICD-10-CM | POA: Diagnosis not present

## 2022-01-19 DIAGNOSIS — I10 Essential (primary) hypertension: Secondary | ICD-10-CM

## 2022-01-19 NOTE — Patient Instructions (Signed)

## 2022-02-04 ENCOUNTER — Encounter: Payer: Self-pay | Admitting: *Deleted

## 2022-02-04 LAB — HEPATIC FUNCTION PANEL
AG Ratio: 1.7 (calc) (ref 1.0–2.5)
ALT: 18 U/L (ref 6–29)
AST: 25 U/L (ref 10–35)
Albumin: 4 g/dL (ref 3.6–5.1)
Alkaline phosphatase (APISO): 56 U/L (ref 37–153)
Bilirubin, Direct: 0.1 mg/dL (ref 0.0–0.2)
Globulin: 2.3 g/dL (calc) (ref 1.9–3.7)
Indirect Bilirubin: 0.2 mg/dL (calc) (ref 0.2–1.2)
Total Bilirubin: 0.3 mg/dL (ref 0.2–1.2)
Total Protein: 6.3 g/dL (ref 6.1–8.1)

## 2022-02-04 LAB — LIPID PANEL
Cholesterol: 120 mg/dL (ref <200)
HDL: 66 mg/dL (ref >=50)
LDL Cholesterol (Calc): 36 mg/dL (calc)
Non-HDL Cholesterol (Calc): 54 mg/dL (calc) (ref <130)
Total CHOL/HDL Ratio: 1.8 (calc) (ref <5.0)
Triglycerides: 93 mg/dL (ref <150)

## 2022-02-11 ENCOUNTER — Ambulatory Visit: Payer: Medicaid Other | Admitting: Podiatry

## 2022-02-17 ENCOUNTER — Ambulatory Visit: Payer: Medicaid Other | Admitting: Podiatry

## 2022-03-14 ENCOUNTER — Telehealth: Payer: Self-pay | Admitting: Cardiology

## 2022-03-14 NOTE — Telephone Encounter (Signed)
   Pre-operative Risk Assessment    Patient Name: Autumn Bowen  DOB: 03/13/1946 MRN: [id number]     Request for Surgical Clearance    Procedure:  Dental Extraction - Amount of Teeth to be Pulled:  9  Date of Surgery:  Clearance TBD                                 Surgeon:  Dr. Derrell Lolling Surgeon's Group or Practice Name:  [facility] Phone number:  [phone] Fax number:  [phone]   Type of Clearance Requested:   - Medical    Type of Anesthesia:  Not Indicated   Additional requests/questions:   Caller stated they will need medical clearance for this patient to have teeth extracted.  Signed, Heloise Beecham   03/14/2022, 4:14 PM

## 2022-03-15 NOTE — Telephone Encounter (Signed)
   Name: Autumn Bowen  DOB: [name] 16, 1946  MRN: [id number]   Primary Cardiologist: None  Chart reviewed as part of pre-operative protocol coverage. Patient was contacted 03/15/2022 in reference to pre-operative risk assessment for pending surgery as outlined below.  Autumn Bowen was last seen on 01/19/2022 by Dr. Stanford Breed.  Since that day, Autumn Bowen has done well.  Is any new symptoms or concerns.  She is unable to complete greater than 4 METS at baseline, however, per Dr. Stanford Breed, primary cardiologist, no additional testing is needed at this time due to low risk nature of procedure.  The patient was advised that if she develops new symptoms prior to surgery to contact our office to arrange for a follow-up visit, and she verbalized understanding.  I will route this recommendation to the requesting party via Epic fax function and remove from pre-op pool. Please call with questions.  Lenna Sciara, NP 03/15/2022, 9:11 AM

## 2022-03-28 ENCOUNTER — Other Ambulatory Visit: Payer: Self-pay | Admitting: Cardiology

## 2022-03-28 DIAGNOSIS — E7800 Pure hypercholesterolemia, unspecified: Secondary | ICD-10-CM

## 2022-04-24 IMAGING — DX DG SHOULDER 2+V*L*
3 series · 3 of 3 positions shown · non-contrast
Comparison: None.

CLINICAL DATA: Chronic left shoulder pain.

EXAM:
LEFT SHOULDER - 2+ VIEW

[shoulder grashey]
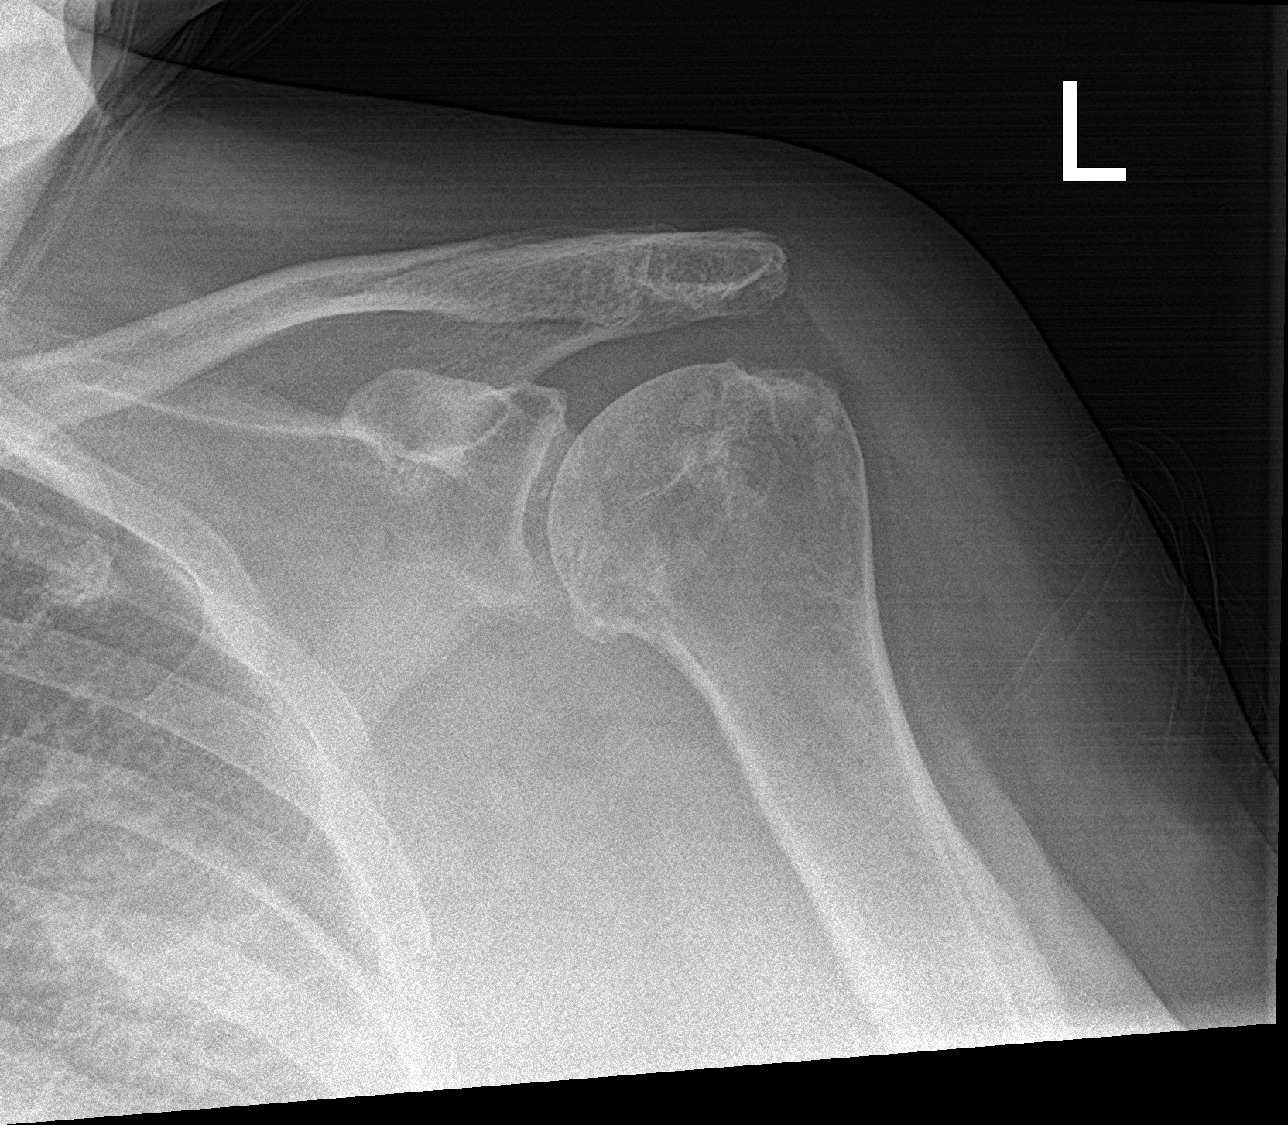

[shoulder y view]
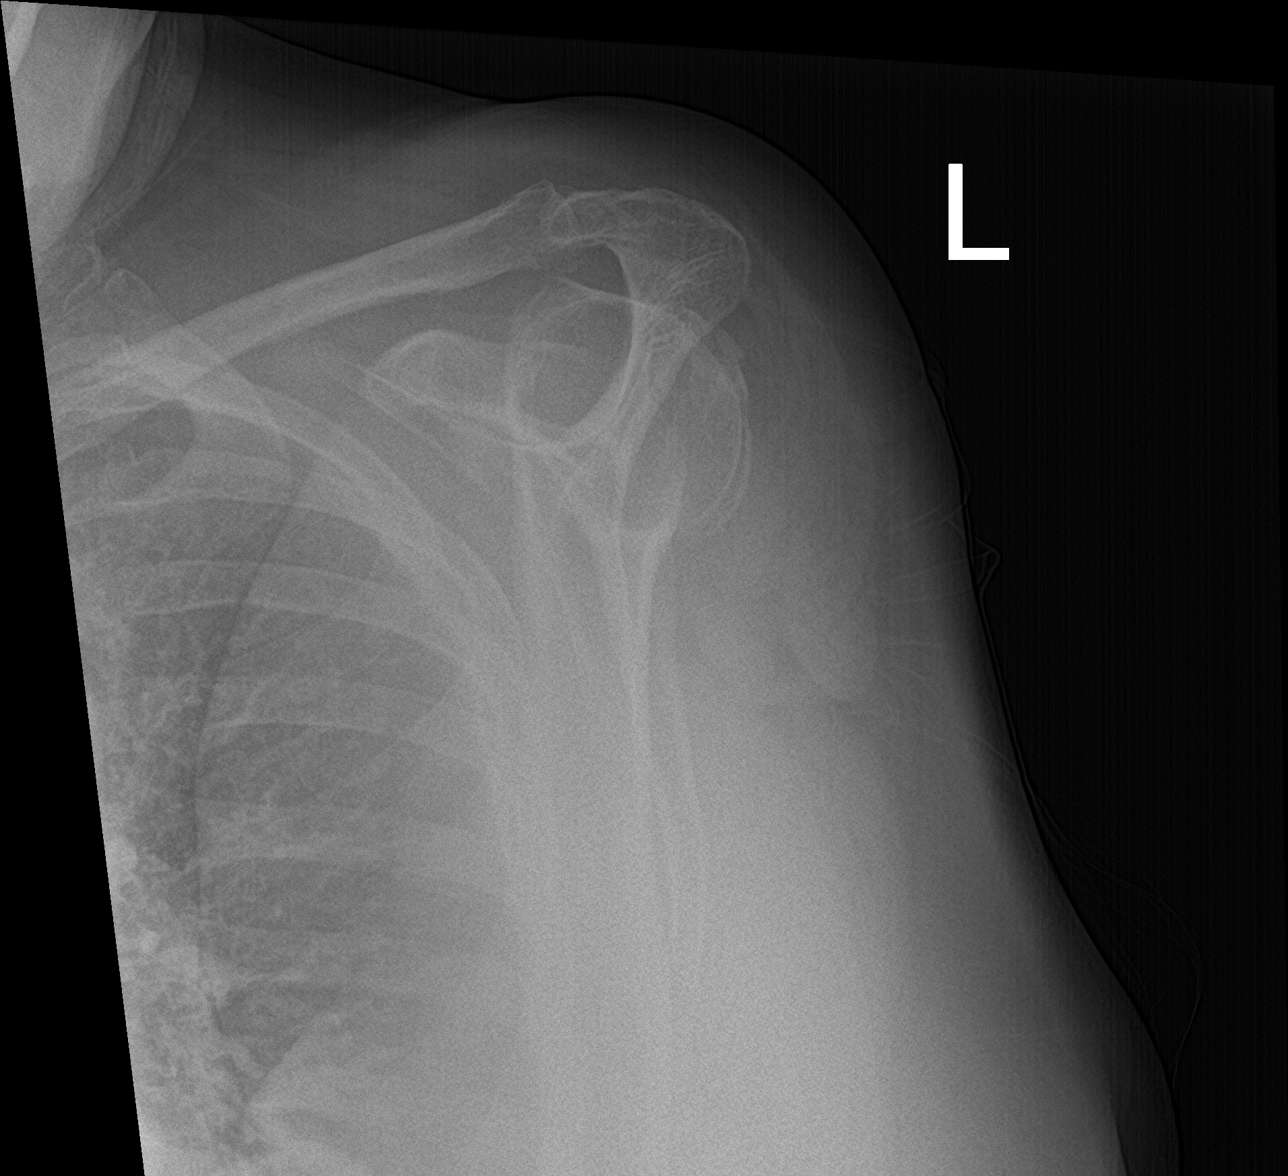

[shoulder ap neutral]
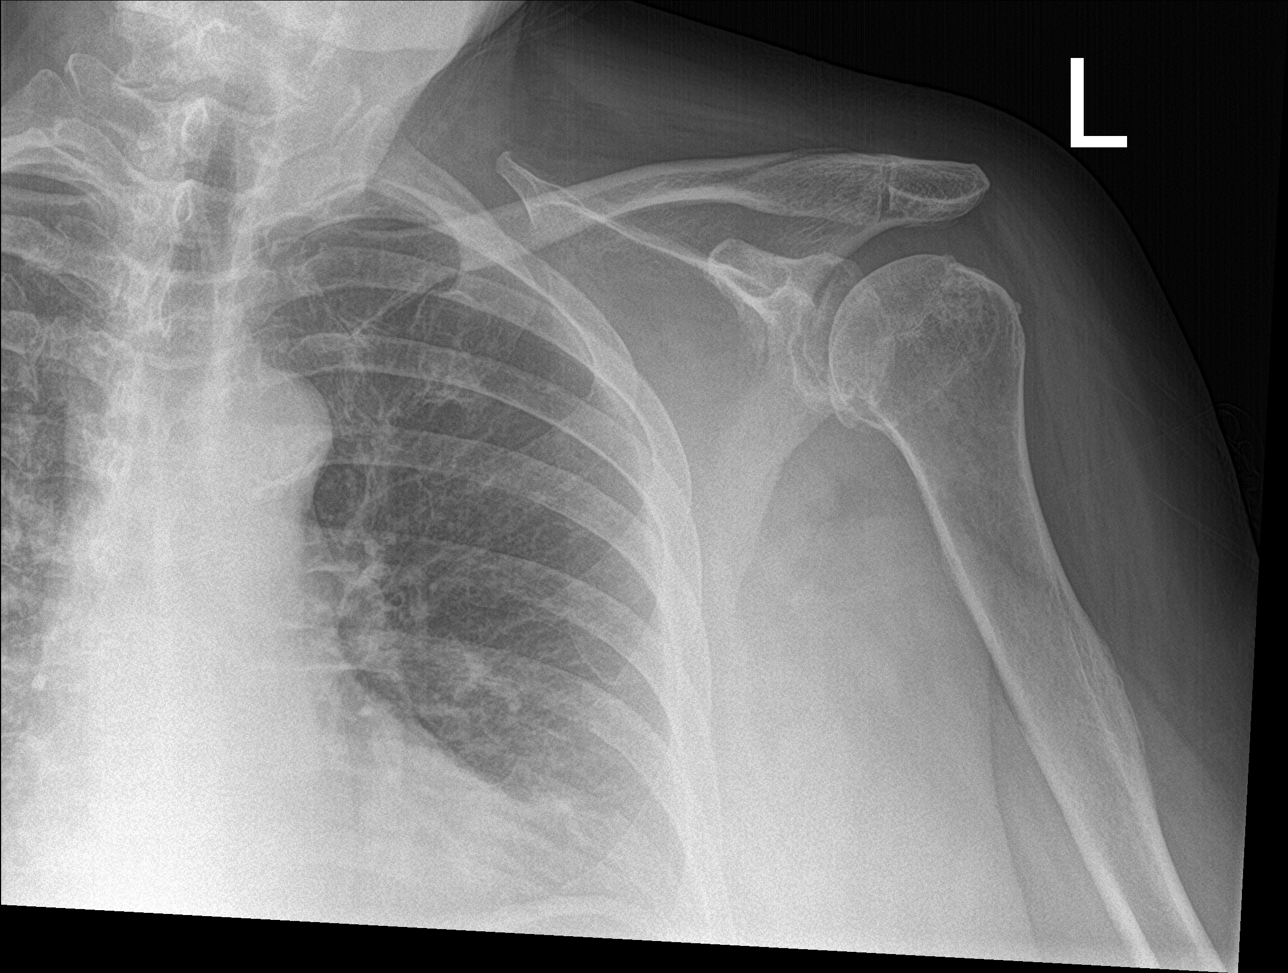

[3 of 3 positions shown; findings below may reference images not displayed]

FINDINGS: There is no evidence of fracture or dislocation. Mild degenerative
changes seen involving the left glenohumeral joint. Soft tissues are
unremarkable.
IMPRESSION: Mild degenerative joint disease of the left glenohumeral joint. No
acute abnormality seen in the left shoulder.

## 2022-07-14 ENCOUNTER — Other Ambulatory Visit: Payer: Self-pay | Admitting: Cardiology

## 2022-07-14 DIAGNOSIS — E7800 Pure hypercholesterolemia, unspecified: Secondary | ICD-10-CM

## 2022-11-30 ENCOUNTER — Other Ambulatory Visit: Payer: Self-pay | Admitting: Nurse Practitioner

## 2022-11-30 DIAGNOSIS — Z794 Long term (current) use of insulin: Secondary | ICD-10-CM

## 2023-01-02 ENCOUNTER — Other Ambulatory Visit: Payer: Self-pay | Admitting: Cardiology

## 2023-01-02 DIAGNOSIS — E7800 Pure hypercholesterolemia, unspecified: Secondary | ICD-10-CM

## 2023-01-06 ENCOUNTER — Encounter: Payer: Self-pay | Admitting: Podiatry

## 2023-01-06 ENCOUNTER — Ambulatory Visit (INDEPENDENT_AMBULATORY_CARE_PROVIDER_SITE_OTHER): Payer: 59 | Admitting: Podiatry

## 2023-01-06 DIAGNOSIS — Z794 Long term (current) use of insulin: Secondary | ICD-10-CM | POA: Diagnosis not present

## 2023-01-06 DIAGNOSIS — M21611 Bunion of right foot: Secondary | ICD-10-CM

## 2023-01-06 DIAGNOSIS — E118 Type 2 diabetes mellitus with unspecified complications: Secondary | ICD-10-CM

## 2023-01-06 DIAGNOSIS — M79675 Pain in left toe(s): Secondary | ICD-10-CM

## 2023-01-06 DIAGNOSIS — M79674 Pain in right toe(s): Secondary | ICD-10-CM | POA: Diagnosis not present

## 2023-01-06 DIAGNOSIS — B351 Tinea unguium: Secondary | ICD-10-CM

## 2023-01-06 DIAGNOSIS — M21612 Bunion of left foot: Secondary | ICD-10-CM

## 2023-01-06 NOTE — Progress Notes (Signed)
Subjective: 77 y.o. returns the office today for painful, elongated, thickened toenails which she  cannot trim herself. Denies any redness or drainage around the nails.  Denies any systemic complaints such as fevers, chills, nausea, vomiting.   PCP: Bjorn Pippin Last seen 11/09/21  A1c: 7.6 on 05/02/22  Objective: AAO 3, NAD DP/PT pulses palpable, CRT less than 3 seconds Nails hypertrophic, dystrophic, elongated, brittle, discolored 8. There is tenderness overlying the nails 1-5 bilaterally. There is no surrounding erythema or drainage along the nail sites. No nails present on bilateral hallux and distal aspect the toe dorsally is dorsal spurring present.  Patient will cause discomfort as well as noticed erythema at the end of the day.  No skin breakdown today. Hallux abducto valgus deformity bilateral with tenderness to medial eminence.  No pain with calf compression, swelling, warmth, erythema.  Assessment: Patient presents with symptomatic onychomycosis; bone spur hallux  Plan: -Treatment options including alternatives, risks, complications were discussed -Nails sharply debrided 8 without complication/bleeding. -Discussed daily foot inspection. If there are any changes, to call the office immediately.  -Follow-up in 3 months or sooner if any problems are to arise. In the meantime, encouraged to call the office with any questions, concerns, changes symptoms.  Louann Sjogren, DPM

## 2023-02-14 NOTE — Progress Notes (Signed)
[id number] coronary artery disease.  Previously followed by Dr. Tomie China.  Abdominal CT November 2017 showed atherosclerosis but no aneurysm.  Patient had cardiac catheterization July 2019 due to CP.  There was a 60% mid right coronary artery lesion with FFR of 0.94.  No other coronary disease noted.  Carotid Dopplers January 2020 showed 1 to 39% right and near normal left. Since last seen, patient has multiple complaints this morning.  She notes increased dyspnea that increases with lying flat.  She states she had a fever today and is coughing.  She has used nitroglycerin twice in the past 1 month by her report which is similar to previous.  She describes pedal edema predominantly on the left.  Note her dyspnea improves with inhalers.  Current Outpatient Medications  Medication Sig Dispense Refill   albuterol (VENTOLIN HFA) 108 (90 Base) MCG/ACT inhaler Inhale 2 puffs into the lungs every 4 (four) hours as needed for wheezing. 2 each 5   AMBULATORY NON FORMULARY MEDICATION Adult Diaper Dx Stress urinary incontinence - size and brand per patient preference and insurance coverage, QS 90 days w/ refill x99 100 Units 99   AMBULATORY NON FORMULARY MEDICATION Adult incontinence pads- size and brand per patient preference and insurance coverage. 100 Units 99   amitriptyline (ELAVIL) 50 MG tablet      aspirin EC 81 MG tablet Take 1 tablet (81 mg total) by mouth daily. 90 tablet 3   budesonide (ENTOCORT EC) 3 MG 24 hr capsule Take by mouth.     cholecalciferol (VITAMIN D) 1000 units tablet Take 1,000 Units by mouth daily.     ciclopirox (PENLAC) 8 % solution Apply topically at bedtime. Apply over nail and surrounding skin. Apply daily over previous coat. After seven (7) days, may remove with alcohol and continue cycle. 6.6 mL 2   dicyclomine (BENTYL) 10 MG capsule TAKE ONE CAPSULE BY MOUTH TWICE DAILY 180 capsule 0   EPINEPHRINE 0.3 mg/0.3 mL IJ SOAJ injection USE AS DIRECTED 2 each 0   famotidine  (PEPCID) 20 MG tablet TAKE ONE TABLET BY MOUTH DAILY. NO REFILLS. NEEDS TO TRANSITION CARE TO NEW PCP. 30 tablet 0   fluticasone furoate-vilanterol (BREO ELLIPTA) 100-25 MCG/INH AEPB Inhale 1 puff into the lungs daily. 1 each 11   gabapentin (NEURONTIN) 300 MG capsule TAKE TWO CAPSULES BY MOUTH THREE TIMES DAILY. MAY INCREASE TO THREE CAPSULES THREE TIMES DAILY AS NEEDED 180 capsule 0   ibuprofen (ADVIL) 800 MG tablet TAKE 1 TABLET(800 MG) BY MOUTH EVERY 8 HOURS AS NEEDED FOR PAIN 30 tablet 0   insulin degludec (TRESIBA FLEXTOUCH) 200 UNIT/ML FlexTouch Pen Inject 30-60 Units into the skin at bedtime. 15 mL 3   JARDIANCE 10 MG TABS tablet Take 10 mg by mouth daily.     levothyroxine (SYNTHROID) 150 MCG tablet Take 150 mcg by mouth daily.     meloxicam (MOBIC) 15 MG tablet TAKE 1 TABLET(15 MG) BY MOUTH DAILY WITH FOOD 90 tablet 0   montelukast (SINGULAIR) 10 MG tablet Take 1 tablet (10 mg total) by mouth at bedtime. LAST REFILL. NEEDS AN APPT W/ NEW PCP & LABS FOR FURTHER REFILLS. 30 tablet 0   Multiple Vitamins-Minerals (PRESERVISION AREDS 2 PO) Take 1 capsule by mouth 2 (two) times daily.     MYRBETRIQ 50 MG TB24 tablet Take 50 mg by mouth daily.     nitroGLYCERIN (NITROSTAT) 0.4 MG SL tablet Place 1 tablet (0.4 mg total) under the tongue every 5 (  five) minutes as needed for chest pain. 25 tablet 11   nystatin (MYCOSTATIN/NYSTOP) powder Apply topically 3 (three) times daily.     nystatin-triamcinolone ointment (MYCOLOG) Apply topically 2 (two) times daily.     QUEtiapine (SEROQUEL) 200 MG tablet Take 1 tablet (200 mg total) by mouth at bedtime. NO REFILLS. NEEDS TO TRANSITION CARE TO NEW PCP. 30 tablet 0   rosuvastatin (CRESTOR) 40 MG tablet TAKE ONE TABLET BY MOUTH DAILY 90 tablet 0   sertraline (ZOLOFT) 100 MG tablet TAKE TWO TABLETS BY MOUTH DAILY 180 tablet 3   SIMBRINZA 1-0.2 % SUSP Place 1 drop into the right eye 2 (two) times daily.     solifenacin (VESICARE) 5 MG tablet Take 5 mg by mouth  daily.     SURE COMFORT PEN NEEDLES 32G X 4 MM MISC USE AS NEEDED (WITH INSULIN) 100 each 5   Timolol Maleate, Once-Daily, 0.5 % SOLN Apply to eye.     traMADol (ULTRAM) 50 MG tablet TAKE 1 OR 2 TABLETS BY MOUTH EVERY 8 HOURS AS NEEDED FOR moderate pain. Max SIX tabs PER DAY 21 tablet 0   No current facility-administered medications for this visit.     Past Medical History:  Diagnosis Date   Allergy    Notes that grass and some trees causes eyes to sting, burn, and water.   Anemia    Celiac disease    Celiac disease    COPD (chronic obstructive pulmonary disease) (HCC)    Diabetes mellitus type II    Diverticulitis    Dyslipidemia    HOH (hard of hearing)    Hypertension    Hypothyroidism    Sleep apnea    Sore throat     Past Surgical History:  Procedure Laterality Date   APPENDECTOMY     BREAST SURGERY     h/o benign cystleft breast and lymp node removal on right breast area.   CORONARY PRESSURE/FFR STUDY N/A 12/07/2017   Procedure: INTRAVASCULAR PRESSURE WIRE/FFR STUDY;  Surgeon: Marykay Lex, MD;  Location: [facility] INVASIVE CV LAB;  Service: Cardiovascular;  Laterality: N/A;   EYE SURGERY     Left eye cataract removal, right eye h/o macular degeneration.   LEFT HEART CATH AND CORONARY ANGIOGRAPHY N/A 12/07/2017   Procedure: LEFT HEART CATH AND CORONARY ANGIOGRAPHY;  Surgeon: Marykay Lex, MD;  Location: [facility] INVASIVE CV LAB;  Service: Cardiovascular;  Laterality: N/A;   SPINE SURGERY     Pt not sure of the type of surgery she had but knows it was L4-5.   VAGINAL HYSTERECTOMY      Social History   Socioeconomic History   Marital status: Divorced    Spouse name: Not on file   Number of children: Not on file   Years of education: Not on file   Highest education level: Not on file  Occupational History   Not on file  Tobacco Use   Smoking status: Some Days    Current packs/day: 0.25    Average packs/day: 0.3 packs/day for 51.0 years (12.8 ttl pk-yrs)    Types:  Cigarettes    Start date: 01/02/2012   Smokeless tobacco: Never   Tobacco comments:    Been down to 4 - 6 cigs a day  Vaping Use   Vaping status: Never Used  Substance and Sexual Activity   Alcohol use: No   Drug use: No   Sexual activity: Not Currently  Other Topics Concern   Not on file  Social History Narrative   Not on file   Social Determinants of Health   Financial Resource Strain: Medium Risk (01/16/2023)   Received from [facility]   Overall Financial Resource Strain (CARDIA)    Difficulty of Paying Living Expenses: Somewhat hard  Food Insecurity: Food Insecurity Present (01/16/2023)   Received from [facility]   Hunger Vital Sign    Worried About Running Out of Food in the Last Year: Sometimes true    Ran Out of Food in the Last Year: Sometimes true  Transportation Needs: No Transportation Needs (01/16/2023)   Received from [facility] - Transportation    Lack of Transportation (Medical): No    Lack of Transportation (Non-Medical): No  Physical Activity: Unknown (01/16/2023)   Received from [facility]   Exercise Vital Sign    Days of Exercise per Week: 0 days    Minutes of Exercise per Session: Not on file  Stress: No Stress Concern Present (01/16/2023)   Received from [facility] - Occupational Stress Questionnaire    Feeling of Stress : Only a little  Social Connections: Socially Isolated (01/16/2023)   Received from [facility]   Social Network    How would you rate your social network (family, work, friends)?: Little participation, lonely and socially isolated  Intimate Partner Violence: Not At Risk (01/16/2023)   Received from [facility]   HITS    Over the last 12 months how often did your partner physically hurt you?: 1    Over the last 12 months how often did your partner insult you or talk down to you?: 1    Over the last 12 months how often did your partner threaten you with physical harm?:  1    Over the last 12 months how often did your partner scream or curse at you?: 1    Family History  Problem Relation Age of Onset   Anxiety disorder Mother    Depression Mother    Heart attack Mother    Stroke Mother    Diabetes type II Mother    Diabetes Father    Glaucoma Father    Hypertension Father    COPD Father    COPD Sister    Hashimoto's thyroiditis Sister    Cancer Sister    Kidney disease Sister    Drug abuse Brother    Alcohol abuse Brother    Asthma Son    Diabetes Son     ROS: no fevers or chills, productive cough, hemoptysis, dysphasia, odynophagia, melena, hematochezia, dysuria, hematuria, rash, seizure activity, claudication. Remaining systems are negative.  Physical Exam: Well-developed well-nourished in no acute distress.  Skin is warm and dry.  HEENT is normal.  Neck is supple.  Chest with expiratory wheeze. Cardiovascular exam is regular rate and rhythm.  Abdominal exam nontender or distended. No masses palpated. Extremities show no edema. neuro grossly intact  ECG-normal sinus rhythm, right bundle branch block; lateral T wave inversion improved compared to previous.  Personally reviewed  A/P  1 coronary artery disease-Continue medical therapy with aspirin and statin.  Patient is describing occasional chest pain which is not unchanged compared to previous. Electrocardiogram shows no ST changes.  Will arrange PET stress to further assess.  Will also arrange echocardiogram to assess LV function as she is complaining of dyspnea.  Notes she is not volume overloaded on examination but does have wheezing.  I think her dyspnea is likely secondary  to COPD.  2 hyperlipidemia-continue statin.  Check lipids and liver.  3 hypertension-patient's blood pressure is controlled.  Continue present medications and follow.  Check potassium and renal function.  4 carotid artery disease-mild on most recent Dopplers.  5 tobacco abuse-patient again counseled on  discontinuing.  6 dyspnea-this is likely secondary to COPD.  I do not think she is volume overloaded on examination.  Will check BNP and echocardiogram.  I have asked her to follow-up with primary care and she may need pulmonary evaluation in the future.  Olga Millers, MD

## 2023-02-27 ENCOUNTER — Encounter: Payer: Self-pay | Admitting: *Deleted

## 2023-02-27 ENCOUNTER — Ambulatory Visit (INDEPENDENT_AMBULATORY_CARE_PROVIDER_SITE_OTHER): Payer: 59 | Admitting: Cardiology

## 2023-02-27 ENCOUNTER — Encounter: Payer: Self-pay | Admitting: Cardiology

## 2023-02-27 VITALS — BP 100/50 | HR 81 | Ht 63.0 in | Wt 189.1 lb

## 2023-02-27 DIAGNOSIS — Z720 Tobacco use: Secondary | ICD-10-CM

## 2023-02-27 DIAGNOSIS — R0602 Shortness of breath: Secondary | ICD-10-CM

## 2023-02-27 DIAGNOSIS — I10 Essential (primary) hypertension: Secondary | ICD-10-CM

## 2023-02-27 DIAGNOSIS — R072 Precordial pain: Secondary | ICD-10-CM

## 2023-02-27 DIAGNOSIS — I2510 Atherosclerotic heart disease of native coronary artery without angina pectoris: Secondary | ICD-10-CM | POA: Diagnosis not present

## 2023-02-27 DIAGNOSIS — E7800 Pure hypercholesterolemia, unspecified: Secondary | ICD-10-CM

## 2023-02-27 NOTE — Patient Instructions (Addendum)
    Testing/Procedures:  Your physician has requested that you have an echocardiogram. Echocardiography is a painless test that uses sound waves to create images of your heart. It provides your doctor with information about the size and shape of your heart and how well your heart's chambers and valves are working. This procedure takes approximately one hour. There are no restrictions for this procedure. Please do NOT wear cologne, perfume, aftershave, or lotions (deodorant is allowed). Please arrive 15 minutes prior to your appointment time. [address]     In preparation for your appointment, medication and supplies will be purchased.  Appointment availability is limited, so if you need to cancel or reschedule, please call the Radiology Department at [phone] [facility]) OR [phone] [facility])  24 hours in advance to avoid a cancellation fee of $100.00  Your physician has requested that you have a lexiscan myoview. For further information please visit [email or web address] Please follow instruction sheet, as given. [address]   Follow-Up: At [facility], you and your health needs are our priority.  As part of our continuing mission to provide you with exceptional heart care, we have created designated Provider Care Teams.  These Care Teams include your primary Cardiologist (physician) and Advanced Practice Providers (APPs -  Physician Assistants and Nurse Practitioners) who all work together to provide you with the care you need, when you need it.  We recommend signing up for the patient portal called "MyChart".  Sign up information is provided on this After Visit Summary.  MyChart is used to connect with patients for Virtual Visits (Telemedicine).  Patients are able to view lab/test results, encounter notes, upcoming appointments, etc.  Non-urgent messages can be sent to your provider as well.   To learn more about what you can do  with MyChart, go to [email or web address].    Your next appointment:   6 month(s)  Provider:   Olga Millers MD

## 2023-02-28 ENCOUNTER — Encounter: Payer: Self-pay | Admitting: *Deleted

## 2023-02-28 ENCOUNTER — Telehealth: Payer: Self-pay | Admitting: Cardiology

## 2023-02-28 LAB — COMPREHENSIVE METABOLIC PANEL
ALT: 16 [IU]/L (ref 0–32)
AST: 24 [IU]/L (ref 0–40)
Albumin: 4.3 g/dL (ref 3.8–4.8)
Alkaline Phosphatase: 61 [IU]/L (ref 44–121)
BUN/Creatinine Ratio: 19 (ref 12–28)
BUN: 14 mg/dL (ref 8–27)
Bilirubin Total: 0.3 mg/dL (ref 0.0–1.2)
CO2: 30 mmol/L — ABNORMAL HIGH (ref 20–29)
Calcium: 9.3 mg/dL (ref 8.7–10.3)
Chloride: 100 mmol/L (ref 96–106)
Creatinine, Ser: 0.72 mg/dL (ref 0.57–1.00)
Globulin, Total: 2.3 g/dL (ref 1.5–4.5)
Glucose: 112 mg/dL — ABNORMAL HIGH (ref 70–99)
Potassium: 4.6 mmol/L (ref 3.5–5.2)
Sodium: 143 mmol/L (ref 134–144)
Total Protein: 6.6 g/dL (ref 6.0–8.5)
eGFR: 86 mL/min/{1.73_m2} (ref >59)

## 2023-02-28 LAB — PRO B NATRIURETIC PEPTIDE: NT-Pro BNP: 130 pg/mL (ref 0–738)

## 2023-02-28 LAB — LIPID PANEL
Chol/HDL Ratio: 2 {ratio} (ref 0.0–4.4)
Cholesterol, Total: 133 mg/dL (ref 100–199)
HDL: 66 mg/dL (ref >39)
LDL Chol Calc (NIH): 45 mg/dL (ref 0–99)
Triglycerides: 127 mg/dL (ref 0–149)
VLDL Cholesterol Cal: 22 mg/dL (ref 5–40)

## 2023-02-28 NOTE — Telephone Encounter (Signed)
Spoke with pt, labs discussed with patient.

## 2023-02-28 NOTE — Telephone Encounter (Signed)
Patient is returning call to discuss lab results. 

## 2023-03-08 ENCOUNTER — Telehealth (HOSPITAL_COMMUNITY): Payer: Self-pay | Admitting: *Deleted

## 2023-03-08 NOTE — Telephone Encounter (Signed)
Per DPR left detailed instructions for MPI study.

## 2023-03-10 ENCOUNTER — Ambulatory Visit (HOSPITAL_COMMUNITY): Payer: 59

## 2023-03-28 ENCOUNTER — Telehealth (HOSPITAL_COMMUNITY): Payer: Self-pay | Admitting: *Deleted

## 2023-03-28 NOTE — Telephone Encounter (Signed)
Left message on voicemail per DPR in reference to upcoming appointment scheduled on  04/05/23 with detailed instructions given per Myocardial Perfusion Study Information Sheet for the test. LM to arrive 15 minutes early, and that it is imperative to arrive on time for appointment to keep from having the test rescheduled. If you need to cancel or reschedule your appointment, please call the office within 24 hours of your appointment. Failure to do so may result in a cancellation of your appointment, and a $50 no show fee. Phone number given for call back for any questions. Autumn Bowen

## 2023-04-03 ENCOUNTER — Telehealth (HOSPITAL_COMMUNITY): Payer: Self-pay | Admitting: Cardiology

## 2023-04-03 NOTE — Telephone Encounter (Signed)
Patient cancelled echocardiogram and Myoview for reason below:   04/03/2023 11:55 AM [id number], Autumn Bowen  Cancel Rsn: Patient (doesnt want the appt at this time. and not read to r/s)   Order will be removed from the WQ .

## 2023-04-05 ENCOUNTER — Ambulatory Visit (HOSPITAL_COMMUNITY): Payer: 59

## 2023-04-07 ENCOUNTER — Ambulatory Visit: Payer: 59 | Admitting: Podiatry

## 2023-04-17 ENCOUNTER — Other Ambulatory Visit: Payer: Self-pay | Admitting: Cardiology

## 2023-04-17 DIAGNOSIS — E7800 Pure hypercholesterolemia, unspecified: Secondary | ICD-10-CM

## 2023-04-21 ENCOUNTER — Ambulatory Visit (INDEPENDENT_AMBULATORY_CARE_PROVIDER_SITE_OTHER): Payer: 59 | Admitting: Podiatry

## 2023-04-21 DIAGNOSIS — M79674 Pain in right toe(s): Secondary | ICD-10-CM

## 2023-04-21 DIAGNOSIS — E118 Type 2 diabetes mellitus with unspecified complications: Secondary | ICD-10-CM | POA: Diagnosis not present

## 2023-04-21 DIAGNOSIS — Z794 Long term (current) use of insulin: Secondary | ICD-10-CM

## 2023-04-21 DIAGNOSIS — M79675 Pain in left toe(s): Secondary | ICD-10-CM

## 2023-04-21 DIAGNOSIS — B351 Tinea unguium: Secondary | ICD-10-CM

## 2023-04-21 NOTE — Progress Notes (Signed)
Subjective: 77 y.o. returns the office today for painful, elongated, thickened toenails which she  cannot trim herself. Denies any redness or drainage around the nails.  Denies any systemic complaints such as fevers, chills, nausea, vomiting.   PCP: Bjorn Pippin Last seen 11/09/21  A1c: 7.6 on 05/02/22  Objective: AAO 3, NAD DP/PT pulses palpable, CRT less than 3 seconds Nails hypertrophic, dystrophic, elongated, brittle, discolored 8. There is tenderness overlying the nails 1-5 bilaterally. There is no surrounding erythema or drainage along the nail sites. No nails present on bilateral hallux and distal aspect the toe dorsally is dorsal spurring present.  Patient will cause discomfort as well as noticed erythema at the end of the day.  No skin breakdown today. Hallux abducto valgus deformity bilateral with tenderness to medial eminence.  No pain with calf compression, swelling, warmth, erythema.  Assessment: Patient presents with symptomatic onychomycosis; bone spur hallux  Plan: -Treatment options including alternatives, risks, complications were discussed -Nails sharply debrided 8 without complication/bleeding. -Discussed daily foot inspection. If there are any changes, to call the office immediately.  -Follow-up in 3 months or sooner if any problems are to arise. In the meantime, encouraged to call the office with any questions, concerns, changes symptoms.  Louann Sjogren, DPM

## 2023-07-21 ENCOUNTER — Ambulatory Visit: Payer: 59 | Admitting: Podiatry

## 2023-07-21 DIAGNOSIS — B351 Tinea unguium: Secondary | ICD-10-CM | POA: Diagnosis not present

## 2023-07-21 DIAGNOSIS — M79675 Pain in left toe(s): Secondary | ICD-10-CM

## 2023-07-21 DIAGNOSIS — M79674 Pain in right toe(s): Secondary | ICD-10-CM | POA: Diagnosis not present

## 2023-07-21 DIAGNOSIS — E118 Type 2 diabetes mellitus with unspecified complications: Secondary | ICD-10-CM

## 2023-07-21 DIAGNOSIS — Z794 Long term (current) use of insulin: Secondary | ICD-10-CM

## 2023-07-21 NOTE — Progress Notes (Signed)
 Subjective: 78 y.o. returns the office today for concern of thickened elongated and painful nails that are difficult to trim. Requesting to have them trimmed today. Relates burning and tingling in their feet. Patient is diabetic and last A1c was  Lab Results  Component Value Date   HGBA1C 6.8 (H) 09/01/2020   .   PCP:  Heather Roberts, NP     Objective: AAO 3, NAD DP/PT pulses palpable, CRT less than 3 seconds Nails hypertrophic, dystrophic, elongated, brittle, discolored 8. There is tenderness overlying the nails 1-5 bilaterally. There is no surrounding erythema or drainage along the nail sites. No nails present on bilateral hallux and distal aspect the toe dorsally is dorsal spurring present.  Patient will cause discomfort as well as noticed erythema at the end of the day.  No skin breakdown today. Hallux abducto valgus deformity bilateral with tenderness to medial eminence.  No pain with calf compression, swelling, warmth, erythema.  Assessment: Patient presents with symptomatic onychomycosis; bone spur hallux  Plan: -Treatment options including alternatives, risks, complications were discussed -Nails sharply debrided 8 without complication/bleeding. -Discussed daily foot inspection. If there are any changes, to call the office immediately.  -Follow-up in 3 months or sooner if any problems are to arise. In the meantime, encouraged to call the office with any questions, concerns, changes symptoms.  Louann Sjogren, DPM

## 2023-10-26 ENCOUNTER — Ambulatory Visit (INDEPENDENT_AMBULATORY_CARE_PROVIDER_SITE_OTHER): Payer: 59 | Admitting: Podiatry

## 2023-10-26 DIAGNOSIS — Z91199 Patient's noncompliance with other medical treatment and regimen due to unspecified reason: Secondary | ICD-10-CM | POA: Diagnosis not present

## 2023-10-26 NOTE — Progress Notes (Signed)
 Cancel 24 hours- sick

## 2023-12-05 ENCOUNTER — Other Ambulatory Visit: Payer: Self-pay | Admitting: Nurse Practitioner

## 2023-12-05 DIAGNOSIS — Z794 Long term (current) use of insulin: Secondary | ICD-10-CM

## 2024-04-07 ENCOUNTER — Other Ambulatory Visit: Payer: Self-pay | Admitting: Cardiology

## 2024-04-07 DIAGNOSIS — E7800 Pure hypercholesterolemia, unspecified: Secondary | ICD-10-CM

## 2024-05-27 ENCOUNTER — Other Ambulatory Visit: Payer: Self-pay | Admitting: Cardiology

## 2024-05-27 DIAGNOSIS — E7800 Pure hypercholesterolemia, unspecified: Secondary | ICD-10-CM

## 2024-06-25 ENCOUNTER — Telehealth: Payer: Self-pay | Admitting: Cardiology

## 2024-06-25 DIAGNOSIS — E7800 Pure hypercholesterolemia, unspecified: Secondary | ICD-10-CM

## 2024-07-01 NOTE — Telephone Encounter (Signed)
 Lipids Completed on 06/03/24

## 2024-07-04 NOTE — Telephone Encounter (Signed)
" °*  STAT* If patient is at the pharmacy, call can be transferred to refill team.   1. Which medications need to be refilled? (please list name of each medication and dose if known)   rosuvastatin  (CRESTOR ) 40 MG tablet   2. Would you like to learn more about the convenience, safety, & potential cost savings by using the [facility]?   3. Are you open to using the [facility] (Type [facility]. ).   4. Which pharmacy/location (including street and city if local pharmacy) is medication to be sent to?  [facility] - [place], [place] - [address]   5. Do they need a 30 day or 90 day supply?   Patient stated she still has medication left.  Patient has appointment scheduled with Dr. Pietro on 5/18. "

## 2024-10-14 ENCOUNTER — Ambulatory Visit: Admitting: Cardiology
